# Patient Record
Sex: Male | Born: 1945 | Race: White | Hispanic: No | Marital: Married | Smoking: Never smoker
Health system: Southern US, Community
[De-identification: ages and names within clinical notes are randomized; demographics above are authoritative.]

## PROBLEM LIST (undated history)

## (undated) DIAGNOSIS — R0600 Dyspnea, unspecified: Secondary | ICD-10-CM

## (undated) DIAGNOSIS — R918 Other nonspecific abnormal finding of lung field: Secondary | ICD-10-CM

## (undated) DIAGNOSIS — I4821 Permanent atrial fibrillation: Secondary | ICD-10-CM

## (undated) DIAGNOSIS — R011 Cardiac murmur, unspecified: Secondary | ICD-10-CM

## (undated) DIAGNOSIS — D649 Anemia, unspecified: Secondary | ICD-10-CM

## (undated) DIAGNOSIS — I10 Essential (primary) hypertension: Secondary | ICD-10-CM

## (undated) DIAGNOSIS — I272 Pulmonary hypertension, unspecified: Secondary | ICD-10-CM

## (undated) DIAGNOSIS — C801 Malignant (primary) neoplasm, unspecified: Secondary | ICD-10-CM

## (undated) DIAGNOSIS — I219 Acute myocardial infarction, unspecified: Secondary | ICD-10-CM

## (undated) DIAGNOSIS — Z836 Family history of other diseases of the respiratory system: Secondary | ICD-10-CM

## (undated) DIAGNOSIS — I2510 Atherosclerotic heart disease of native coronary artery without angina pectoris: Secondary | ICD-10-CM

## (undated) DIAGNOSIS — I341 Nonrheumatic mitral (valve) prolapse: Secondary | ICD-10-CM

## (undated) DIAGNOSIS — I639 Cerebral infarction, unspecified: Secondary | ICD-10-CM

## (undated) DIAGNOSIS — E785 Hyperlipidemia, unspecified: Secondary | ICD-10-CM

## (undated) DIAGNOSIS — I77810 Thoracic aortic ectasia: Secondary | ICD-10-CM

## (undated) DIAGNOSIS — F32A Depression, unspecified: Secondary | ICD-10-CM

## (undated) DIAGNOSIS — Z86010 Personal history of colonic polyps: Secondary | ICD-10-CM

## (undated) DIAGNOSIS — K219 Gastro-esophageal reflux disease without esophagitis: Secondary | ICD-10-CM

## (undated) DIAGNOSIS — M1990 Unspecified osteoarthritis, unspecified site: Secondary | ICD-10-CM

## (undated) DIAGNOSIS — I499 Cardiac arrhythmia, unspecified: Secondary | ICD-10-CM

## (undated) DIAGNOSIS — J159 Unspecified bacterial pneumonia: Secondary | ICD-10-CM

## (undated) DIAGNOSIS — F329 Major depressive disorder, single episode, unspecified: Secondary | ICD-10-CM

## (undated) DIAGNOSIS — J479 Bronchiectasis, uncomplicated: Secondary | ICD-10-CM

## (undated) DIAGNOSIS — I071 Rheumatic tricuspid insufficiency: Secondary | ICD-10-CM

## (undated) DIAGNOSIS — R7402 Elevation of levels of lactic acid dehydrogenase (LDH): Secondary | ICD-10-CM

## (undated) DIAGNOSIS — R740 Nonspecific elevation of levels of transaminase and lactic acid dehydrogenase [LDH]: Secondary | ICD-10-CM

## (undated) DIAGNOSIS — I77819 Aortic ectasia, unspecified site: Secondary | ICD-10-CM

## (undated) DIAGNOSIS — H269 Unspecified cataract: Secondary | ICD-10-CM

## (undated) DIAGNOSIS — R7401 Elevation of levels of liver transaminase levels: Secondary | ICD-10-CM

## (undated) DIAGNOSIS — R590 Localized enlarged lymph nodes: Secondary | ICD-10-CM

## (undated) DIAGNOSIS — K5790 Diverticulosis of intestine, part unspecified, without perforation or abscess without bleeding: Secondary | ICD-10-CM

## (undated) HISTORY — DX: Permanent atrial fibrillation: I48.21

## (undated) HISTORY — PX: CARDIOVERSION: SHX1299

## (undated) HISTORY — DX: Nonspecific elevation of levels of transaminase and lactic acid dehydrogenase (ldh): R74.0

## (undated) HISTORY — DX: Thoracic aortic ectasia: I77.810

## (undated) HISTORY — DX: Cardiac murmur, unspecified: R01.1

## (undated) HISTORY — DX: Unspecified osteoarthritis, unspecified site: M19.90

## (undated) HISTORY — DX: Diverticulosis of intestine, part unspecified, without perforation or abscess without bleeding: K57.90

## (undated) HISTORY — PX: NASAL RECONSTRUCTION: SHX2069

## (undated) HISTORY — DX: Pulmonary hypertension, unspecified: I27.20

## (undated) HISTORY — DX: Nonrheumatic mitral (valve) prolapse: I34.1

## (undated) HISTORY — PX: TONSILLECTOMY: SUR1361

## (undated) HISTORY — DX: Rheumatic tricuspid insufficiency: I07.1

## (undated) HISTORY — DX: Unspecified cataract: H26.9

## (undated) HISTORY — DX: Gastro-esophageal reflux disease without esophagitis: K21.9

## (undated) HISTORY — DX: Hyperlipidemia, unspecified: E78.5

## (undated) HISTORY — DX: Family history of other diseases of the respiratory system: Z83.6

## (undated) HISTORY — DX: Elevation of levels of lactic acid dehydrogenase (LDH): R74.02

## (undated) HISTORY — DX: Unspecified bacterial pneumonia: J15.9

## (undated) HISTORY — DX: Personal history of colonic polyps: Z86.010

## (undated) HISTORY — DX: Aortic ectasia, unspecified site: I77.819

## (undated) HISTORY — DX: Major depressive disorder, single episode, unspecified: F32.9

## (undated) HISTORY — DX: Anemia, unspecified: D64.9

## (undated) HISTORY — DX: Depression, unspecified: F32.A

## (undated) HISTORY — DX: Essential (primary) hypertension: I10

## (undated) HISTORY — DX: Elevation of levels of liver transaminase levels: R74.01

---

## 1998-05-25 ENCOUNTER — Ambulatory Visit (HOSPITAL_COMMUNITY): Admission: RE | Admit: 1998-05-25 | Discharge: 1998-05-25 | Payer: Self-pay | Admitting: Internal Medicine

## 1999-03-07 ENCOUNTER — Emergency Department (HOSPITAL_COMMUNITY): Admission: EM | Admit: 1999-03-07 | Discharge: 1999-03-07 | Payer: Self-pay | Admitting: Emergency Medicine

## 1999-03-07 ENCOUNTER — Encounter: Payer: Self-pay | Admitting: Emergency Medicine

## 1999-08-25 ENCOUNTER — Encounter: Payer: Self-pay | Admitting: Internal Medicine

## 1999-08-25 ENCOUNTER — Ambulatory Visit (HOSPITAL_COMMUNITY): Admission: RE | Admit: 1999-08-25 | Discharge: 1999-08-25 | Payer: Self-pay | Admitting: Internal Medicine

## 2001-11-13 HISTORY — PX: HERNIA REPAIR: SHX51

## 2001-11-22 ENCOUNTER — Ambulatory Visit (HOSPITAL_BASED_OUTPATIENT_CLINIC_OR_DEPARTMENT_OTHER): Admission: RE | Admit: 2001-11-22 | Discharge: 2001-11-22 | Payer: Self-pay | Admitting: Surgery

## 2001-11-26 ENCOUNTER — Ambulatory Visit (HOSPITAL_BASED_OUTPATIENT_CLINIC_OR_DEPARTMENT_OTHER): Admission: RE | Admit: 2001-11-26 | Discharge: 2001-11-26 | Payer: Self-pay | Admitting: Surgery

## 2003-06-23 ENCOUNTER — Encounter: Payer: Self-pay | Admitting: Internal Medicine

## 2004-03-23 ENCOUNTER — Ambulatory Visit: Payer: Self-pay | Admitting: Cardiology

## 2004-04-19 ENCOUNTER — Ambulatory Visit: Payer: Self-pay | Admitting: Cardiology

## 2004-05-16 HISTORY — PX: COLONOSCOPY: SHX174

## 2004-05-31 ENCOUNTER — Ambulatory Visit: Payer: Self-pay

## 2004-06-28 ENCOUNTER — Ambulatory Visit: Payer: Self-pay | Admitting: Internal Medicine

## 2004-06-28 ENCOUNTER — Ambulatory Visit: Payer: Self-pay | Admitting: Cardiology

## 2004-07-06 ENCOUNTER — Ambulatory Visit: Payer: Self-pay | Admitting: Internal Medicine

## 2004-07-26 ENCOUNTER — Ambulatory Visit: Payer: Self-pay | Admitting: Cardiology

## 2004-08-23 ENCOUNTER — Ambulatory Visit: Payer: Self-pay | Admitting: Cardiology

## 2004-09-20 ENCOUNTER — Ambulatory Visit: Payer: Self-pay | Admitting: *Deleted

## 2004-11-01 ENCOUNTER — Ambulatory Visit: Payer: Self-pay | Admitting: Cardiology

## 2004-11-22 ENCOUNTER — Ambulatory Visit: Payer: Self-pay | Admitting: Internal Medicine

## 2004-12-20 ENCOUNTER — Ambulatory Visit: Payer: Self-pay | Admitting: Cardiology

## 2005-01-24 ENCOUNTER — Ambulatory Visit: Payer: Self-pay | Admitting: Cardiology

## 2005-02-14 ENCOUNTER — Ambulatory Visit: Payer: Self-pay | Admitting: Cardiology

## 2005-03-01 ENCOUNTER — Ambulatory Visit: Payer: Self-pay | Admitting: Internal Medicine

## 2005-03-22 ENCOUNTER — Ambulatory Visit: Payer: Self-pay | Admitting: Internal Medicine

## 2005-03-30 ENCOUNTER — Ambulatory Visit: Payer: Self-pay | Admitting: Cardiology

## 2005-04-15 ENCOUNTER — Ambulatory Visit: Payer: Self-pay | Admitting: Internal Medicine

## 2005-05-23 ENCOUNTER — Ambulatory Visit: Payer: Self-pay | Admitting: Cardiology

## 2005-06-20 ENCOUNTER — Ambulatory Visit: Payer: Self-pay | Admitting: Cardiology

## 2005-07-15 ENCOUNTER — Ambulatory Visit: Payer: Self-pay | Admitting: Cardiology

## 2005-08-16 ENCOUNTER — Ambulatory Visit: Payer: Self-pay | Admitting: Cardiology

## 2005-08-24 ENCOUNTER — Ambulatory Visit: Payer: Self-pay | Admitting: Internal Medicine

## 2005-09-19 ENCOUNTER — Ambulatory Visit: Payer: Self-pay | Admitting: Internal Medicine

## 2005-09-19 ENCOUNTER — Ambulatory Visit: Payer: Self-pay | Admitting: Cardiology

## 2005-10-12 ENCOUNTER — Ambulatory Visit: Payer: Self-pay | Admitting: Internal Medicine

## 2005-11-01 ENCOUNTER — Ambulatory Visit: Payer: Self-pay | Admitting: Cardiology

## 2005-12-05 ENCOUNTER — Ambulatory Visit: Payer: Self-pay | Admitting: Cardiology

## 2006-01-02 ENCOUNTER — Ambulatory Visit: Payer: Self-pay | Admitting: Cardiovascular Disease

## 2006-01-30 ENCOUNTER — Ambulatory Visit: Payer: Self-pay | Admitting: Cardiovascular Disease

## 2006-03-02 ENCOUNTER — Ambulatory Visit: Payer: Self-pay | Admitting: Internal Medicine

## 2006-04-07 ENCOUNTER — Ambulatory Visit: Payer: Self-pay | Admitting: Cardiology

## 2006-05-10 ENCOUNTER — Ambulatory Visit: Payer: Self-pay | Admitting: Cardiology

## 2006-06-12 ENCOUNTER — Ambulatory Visit: Payer: Self-pay | Admitting: Cardiology

## 2006-07-10 ENCOUNTER — Ambulatory Visit: Payer: Self-pay | Admitting: Cardiovascular Disease

## 2006-08-07 ENCOUNTER — Ambulatory Visit: Payer: Self-pay | Admitting: Internal Medicine

## 2006-09-06 ENCOUNTER — Ambulatory Visit: Payer: Self-pay | Admitting: Internal Medicine

## 2006-10-11 DIAGNOSIS — R740 Nonspecific elevation of levels of transaminase and lactic acid dehydrogenase [LDH]: Secondary | ICD-10-CM

## 2006-10-13 ENCOUNTER — Ambulatory Visit: Payer: Self-pay | Admitting: Cardiology

## 2006-11-13 ENCOUNTER — Ambulatory Visit: Payer: Self-pay | Admitting: Cardiology

## 2006-12-01 ENCOUNTER — Ambulatory Visit: Payer: Self-pay | Admitting: Cardiovascular Disease

## 2006-12-11 ENCOUNTER — Ambulatory Visit: Payer: Self-pay | Admitting: Internal Medicine

## 2006-12-13 ENCOUNTER — Telehealth (INDEPENDENT_AMBULATORY_CARE_PROVIDER_SITE_OTHER): Payer: Self-pay | Admitting: *Deleted

## 2006-12-25 ENCOUNTER — Ambulatory Visit: Payer: Self-pay | Admitting: Internal Medicine

## 2007-01-08 ENCOUNTER — Ambulatory Visit: Payer: Self-pay | Admitting: Cardiology

## 2007-01-29 ENCOUNTER — Ambulatory Visit: Payer: Self-pay | Admitting: Internal Medicine

## 2007-02-07 ENCOUNTER — Telehealth (INDEPENDENT_AMBULATORY_CARE_PROVIDER_SITE_OTHER): Payer: Self-pay | Admitting: *Deleted

## 2007-02-26 ENCOUNTER — Ambulatory Visit: Payer: Self-pay | Admitting: Internal Medicine

## 2007-03-01 DIAGNOSIS — I080 Rheumatic disorders of both mitral and aortic valves: Secondary | ICD-10-CM

## 2007-03-01 DIAGNOSIS — I079 Rheumatic tricuspid valve disease, unspecified: Secondary | ICD-10-CM

## 2007-03-08 ENCOUNTER — Ambulatory Visit: Payer: Self-pay | Admitting: Internal Medicine

## 2007-03-08 DIAGNOSIS — E782 Mixed hyperlipidemia: Secondary | ICD-10-CM | POA: Insufficient documentation

## 2007-03-13 ENCOUNTER — Ambulatory Visit: Payer: Self-pay

## 2007-03-13 ENCOUNTER — Encounter: Payer: Self-pay | Admitting: Internal Medicine

## 2007-03-14 ENCOUNTER — Encounter (INDEPENDENT_AMBULATORY_CARE_PROVIDER_SITE_OTHER): Payer: Self-pay | Admitting: *Deleted

## 2007-03-26 ENCOUNTER — Ambulatory Visit: Payer: Self-pay | Admitting: Cardiology

## 2007-04-03 ENCOUNTER — Encounter (INDEPENDENT_AMBULATORY_CARE_PROVIDER_SITE_OTHER): Payer: Self-pay | Admitting: *Deleted

## 2007-04-03 ENCOUNTER — Ambulatory Visit: Payer: Self-pay | Admitting: Internal Medicine

## 2007-04-16 ENCOUNTER — Ambulatory Visit: Payer: Self-pay | Admitting: Internal Medicine

## 2007-04-23 ENCOUNTER — Ambulatory Visit: Payer: Self-pay | Admitting: Internal Medicine

## 2007-06-04 ENCOUNTER — Ambulatory Visit: Payer: Self-pay | Admitting: Cardiovascular Disease

## 2007-07-02 ENCOUNTER — Ambulatory Visit: Payer: Self-pay | Admitting: Internal Medicine

## 2007-07-30 ENCOUNTER — Ambulatory Visit: Payer: Self-pay | Admitting: Internal Medicine

## 2007-08-20 ENCOUNTER — Ambulatory Visit: Payer: Self-pay | Admitting: Internal Medicine

## 2007-09-17 ENCOUNTER — Ambulatory Visit: Payer: Self-pay | Admitting: Cardiovascular Disease

## 2007-10-15 ENCOUNTER — Ambulatory Visit: Payer: Self-pay | Admitting: Cardiology

## 2007-10-24 ENCOUNTER — Ambulatory Visit: Payer: Self-pay | Admitting: Internal Medicine

## 2007-10-26 ENCOUNTER — Encounter (INDEPENDENT_AMBULATORY_CARE_PROVIDER_SITE_OTHER): Payer: Self-pay | Admitting: *Deleted

## 2007-10-26 LAB — CONVERTED CEMR LAB
Rheumatoid fact SerPl-aCnc: <20 intl units/mL — ABNORMAL LOW (ref 0.0–20.0)
Sed Rate: 15 mm/hr (ref 0–16)
Uric Acid, Serum: 4.9 mg/dL (ref 4.0–7.8)

## 2007-11-12 ENCOUNTER — Ambulatory Visit: Payer: Self-pay | Admitting: Cardiology

## 2007-12-10 ENCOUNTER — Ambulatory Visit: Payer: Self-pay | Admitting: Cardiology

## 2007-12-20 ENCOUNTER — Ambulatory Visit: Payer: Self-pay | Admitting: Cardiology

## 2008-01-28 ENCOUNTER — Ambulatory Visit: Payer: Self-pay | Admitting: Cardiology

## 2008-01-28 ENCOUNTER — Encounter: Payer: Self-pay | Admitting: Internal Medicine

## 2008-02-25 ENCOUNTER — Ambulatory Visit: Payer: Self-pay | Admitting: Cardiology

## 2008-03-24 ENCOUNTER — Ambulatory Visit: Payer: Self-pay | Admitting: Cardiovascular Disease

## 2008-03-25 ENCOUNTER — Encounter (INDEPENDENT_AMBULATORY_CARE_PROVIDER_SITE_OTHER): Payer: Self-pay | Admitting: *Deleted

## 2008-04-11 ENCOUNTER — Ambulatory Visit: Payer: Self-pay | Admitting: Cardiology

## 2008-05-08 ENCOUNTER — Ambulatory Visit: Payer: Self-pay | Admitting: Cardiovascular Disease

## 2008-06-09 ENCOUNTER — Ambulatory Visit: Payer: Self-pay | Admitting: Internal Medicine

## 2008-06-23 ENCOUNTER — Ambulatory Visit: Payer: Self-pay | Admitting: Internal Medicine

## 2008-07-07 ENCOUNTER — Ambulatory Visit: Payer: Self-pay | Admitting: Cardiovascular Disease

## 2008-08-11 ENCOUNTER — Ambulatory Visit: Payer: Self-pay | Admitting: Cardiology

## 2008-09-08 ENCOUNTER — Ambulatory Visit: Payer: Self-pay | Admitting: Cardiology

## 2008-10-14 ENCOUNTER — Encounter: Payer: Self-pay | Admitting: *Deleted

## 2008-10-24 ENCOUNTER — Ambulatory Visit: Payer: Self-pay | Admitting: Cardiovascular Disease

## 2008-10-24 LAB — CONVERTED CEMR LAB
POC INR: 2
Protime: 17.5

## 2008-11-19 ENCOUNTER — Encounter: Payer: Self-pay | Admitting: *Deleted

## 2008-11-24 ENCOUNTER — Ambulatory Visit: Payer: Self-pay | Admitting: Cardiovascular Disease

## 2008-11-24 ENCOUNTER — Encounter (INDEPENDENT_AMBULATORY_CARE_PROVIDER_SITE_OTHER): Payer: Self-pay | Admitting: Cardiology

## 2008-11-24 LAB — CONVERTED CEMR LAB
POC INR: 2.1
Prothrombin Time: 17.9 s

## 2008-12-01 ENCOUNTER — Ambulatory Visit: Payer: Self-pay | Admitting: Internal Medicine

## 2008-12-01 DIAGNOSIS — M79609 Pain in unspecified limb: Secondary | ICD-10-CM | POA: Insufficient documentation

## 2008-12-01 DIAGNOSIS — F329 Major depressive disorder, single episode, unspecified: Secondary | ICD-10-CM

## 2008-12-01 LAB — CONVERTED CEMR LAB
Cholesterol, target level: 200 mg/dL
HDL goal, serum: 40 mg/dL
LDL Goal: 115 mg/dL

## 2008-12-02 ENCOUNTER — Ambulatory Visit: Payer: Self-pay | Admitting: Internal Medicine

## 2008-12-05 ENCOUNTER — Telehealth (INDEPENDENT_AMBULATORY_CARE_PROVIDER_SITE_OTHER): Payer: Self-pay | Admitting: *Deleted

## 2008-12-08 ENCOUNTER — Encounter (INDEPENDENT_AMBULATORY_CARE_PROVIDER_SITE_OTHER): Payer: Self-pay | Admitting: *Deleted

## 2008-12-29 ENCOUNTER — Ambulatory Visit: Payer: Self-pay | Admitting: Cardiology

## 2008-12-29 LAB — CONVERTED CEMR LAB: POC INR: 1.6

## 2009-01-26 ENCOUNTER — Ambulatory Visit: Payer: Self-pay | Admitting: Internal Medicine

## 2009-01-26 LAB — CONVERTED CEMR LAB: POC INR: 2.1

## 2009-02-05 ENCOUNTER — Encounter: Payer: Self-pay | Admitting: Internal Medicine

## 2009-02-13 ENCOUNTER — Encounter: Payer: Self-pay | Admitting: Internal Medicine

## 2009-02-15 LAB — CONVERTED CEMR LAB
ALT: 26 units/L (ref 0–53)
AST: 34 units/L (ref 0–37)
Albumin: 3.6 g/dL (ref 3.5–5.2)
Alkaline Phosphatase: 52 units/L (ref 39–117)
Bilirubin, Direct: 0.1 mg/dL (ref 0.0–0.3)
Cholesterol: 166 mg/dL (ref 0–200)
HDL: 61.6 mg/dL (ref >39.00)
LDL Cholesterol: 96 mg/dL (ref 0–99)
PSA: 1.31 ng/mL (ref 0.10–4.00)
Rheumatoid fact SerPl-aCnc: <20 intl units/mL (ref 0.0–20.0)
Sed Rate: 5 mm/hr (ref 0–22)
TSH: 2.52 microintl units/mL (ref 0.35–5.50)
Total Bilirubin: 0.9 mg/dL (ref 0.3–1.2)
Total CHOL/HDL Ratio: 3
Total Protein: 6.6 g/dL (ref 6.0–8.3)
Triglycerides: 43 mg/dL (ref 0.0–149.0)
Uric Acid, Serum: 6.3 mg/dL (ref 4.0–7.8)
VLDL: 8.6 mg/dL (ref 0.0–40.0)

## 2009-02-23 ENCOUNTER — Ambulatory Visit: Payer: Self-pay | Admitting: Internal Medicine

## 2009-03-03 ENCOUNTER — Ambulatory Visit: Payer: Self-pay | Admitting: Family Medicine

## 2009-03-23 ENCOUNTER — Ambulatory Visit: Payer: Self-pay | Admitting: Cardiology

## 2009-04-03 ENCOUNTER — Ambulatory Visit: Payer: Self-pay | Admitting: Internal Medicine

## 2009-04-27 ENCOUNTER — Ambulatory Visit: Payer: Self-pay | Admitting: Cardiovascular Disease

## 2009-06-08 ENCOUNTER — Ambulatory Visit: Payer: Self-pay | Admitting: Internal Medicine

## 2009-06-08 LAB — CONVERTED CEMR LAB: POC INR: 2.2

## 2009-07-15 ENCOUNTER — Ambulatory Visit: Payer: Self-pay | Admitting: Cardiovascular Disease

## 2009-07-15 LAB — CONVERTED CEMR LAB: POC INR: 2.1

## 2009-08-28 ENCOUNTER — Ambulatory Visit: Payer: Self-pay | Admitting: Internal Medicine

## 2009-08-31 ENCOUNTER — Encounter: Payer: Self-pay | Admitting: Internal Medicine

## 2009-09-09 ENCOUNTER — Ambulatory Visit: Payer: Self-pay | Admitting: Cardiovascular Disease

## 2009-09-15 ENCOUNTER — Ambulatory Visit: Payer: Self-pay | Admitting: Internal Medicine

## 2009-09-15 LAB — CONVERTED CEMR LAB: LDL Goal: 160 mg/dL

## 2009-11-12 ENCOUNTER — Ambulatory Visit: Payer: Self-pay | Admitting: Cardiology

## 2009-11-12 LAB — CONVERTED CEMR LAB: POC INR: 2.8

## 2009-11-23 ENCOUNTER — Ambulatory Visit: Payer: Self-pay | Admitting: Internal Medicine

## 2009-11-23 ENCOUNTER — Encounter: Payer: Self-pay | Admitting: Internal Medicine

## 2010-01-18 ENCOUNTER — Encounter: Payer: Self-pay | Admitting: Internal Medicine

## 2010-06-13 LAB — CONVERTED CEMR LAB
ALT: 26 units/L (ref 0–53)
AST: 30 units/L (ref 0–37)
Albumin: 4 g/dL (ref 3.5–5.2)
Alkaline Phosphatase: 50 units/L (ref 39–117)
BUN: 8 mg/dL (ref 6–23)
Basophils Absolute: 0 10*3/uL (ref 0.0–0.1)
Basophils Relative: 0.5 % (ref 0.0–1.0)
Bilirubin, Direct: 0.1 mg/dL (ref 0.0–0.3)
CO2: 31 meq/L (ref 19–32)
Calcium: 9.5 mg/dL (ref 8.4–10.5)
Chloride: 105 meq/L (ref 96–112)
Cholesterol: 187 mg/dL (ref 0–200)
Creatinine, Ser: 1 mg/dL (ref 0.4–1.5)
Eosinophils Absolute: 0.1 10*3/uL (ref 0.0–0.6)
Eosinophils Relative: 2.2 % (ref 0.0–5.0)
GFR calc Af Amer: 97 mL/min
GFR calc non Af Amer: 80 mL/min
Glucose, Bld: 99 mg/dL (ref 70–99)
HCT: 40.6 % (ref 39.0–52.0)
HDL: 58 mg/dL (ref >39.0)
Hemoglobin: 14.2 g/dL (ref 13.0–17.0)
LDL Cholesterol: 115 mg/dL — ABNORMAL HIGH (ref 0–99)
Lymphocytes Relative: 24.9 % (ref 12.0–46.0)
MCHC: 35 g/dL (ref 30.0–36.0)
MCV: 100.1 fL — ABNORMAL HIGH (ref 78.0–100.0)
Monocytes Absolute: 0.6 10*3/uL (ref 0.2–0.7)
Monocytes Relative: 9.4 % (ref 3.0–11.0)
Neutro Abs: 3.7 10*3/uL (ref 1.4–7.7)
Neutrophils Relative %: 63 % (ref 43.0–77.0)
PSA: 1.66 ng/mL (ref 0.10–4.00)
Platelets: 212 10*3/uL (ref 150–400)
Potassium: 4.9 meq/L (ref 3.5–5.1)
RBC: 4.05 M/uL — ABNORMAL LOW (ref 4.22–5.81)
RDW: 13.2 % (ref 11.5–14.6)
Sodium: 140 meq/L (ref 135–145)
TSH: 2.7 microintl units/mL (ref 0.35–5.50)
Total Bilirubin: 0.8 mg/dL (ref 0.3–1.2)
Total CHOL/HDL Ratio: 3.2
Total Protein: 7.1 g/dL (ref 6.0–8.3)
Triglycerides: 72 mg/dL (ref 0–149)
VLDL: 14 mg/dL (ref 0–40)
WBC: 5.9 10*3/uL (ref 4.5–10.5)

## 2010-06-17 NOTE — Medication Information (Signed)
Summary: coumadin check/mt  Anticoagulant Therapy  Managed by: Cloyde Reams, RN, BSN Referring MD: Sharrell Ku PCP: hopp Supervising MD: Shirlee Latch MD, Ronnika Collett Indication 1: Atrial Fibrillation (ICD-427.31) Lab Used: [facility] Site: [facility] INR POC 2.8 INR RANGE 2-3  Dietary changes: no    Health status changes: no    Bleeding/hemorrhagic complications: no    Recent/future hospitalizations: no    Any changes in medication regimen? no    Recent/future dental: no  Any missed doses?: no       Is patient compliant with meds? yes       Allergies: 1)  ! * Amiodarone  Anticoagulation Management History:      The patient is taking warfarin and comes in today for a routine follow up visit.  Positive risk factors for bleeding include an age of 66 years or older.  Negative risk factors for bleeding include no history of CVA/TIA.  The bleeding index is 'intermediate risk'.  Negative CHADS2 values include History of HTN, Age > 67 years old, History of Diabetes, and Prior Stroke/CVA/TIA.  The start date was 04/01/1998.  Anticoagulation responsible provider: Shirlee Latch MD, Wess Baney.  INR POC: 2.8.  Cuvette Lot#: [id number].  Exp: 01/2011.    Anticoagulation Management Assessment/Plan:      The patient's current anticoagulation dose is Coumadin 5 mg tabs: Take as directed by coumadin clinic..  The target INR is 2.0-3.0.  The next INR is due 12/14/2009.  Anticoagulation instructions were given to patient.  Results were reviewed/authorized by Cloyde Reams, RN, BSN.  He was notified by Cloyde Reams RN.         Prior Anticoagulation Instructions: INR 2.0  Continue on same dosage 1/2 tablet daily except 1 tablet on Mondays and Thursdays.  Recheck in 4 weeks.    Current Anticoagulation Instructions: INR 2.8  Continue on same dosage 1/2 tablet daily except 1 tablets on Mondays and Thursdays.  Recheck in 4 weeks.

## 2010-06-17 NOTE — Medication Information (Signed)
Summary: rov/ewj  Anticoagulant Therapy  Managed by: Cloyde Reams, RN, BSN Referring MD: Sharrell Ku PCP: hopp Supervising MD: Eden Emms MD, Theron Arista Indication 1: Atrial Fibrillation (ICD-427.31) Lab Used: [facility] Site: [facility] INR POC 2.0 INR RANGE 2-3  Dietary changes: no    Health status changes: no    Bleeding/hemorrhagic complications: no    Recent/future hospitalizations: no    Any changes in medication regimen? no    Recent/future dental: no  Any missed doses?: yes     Details: Missed Sat dose but made up on Sun.  Is patient compliant with meds? yes       Allergies: 1)  ! * Amiodarone  Anticoagulation Management History:      The patient is taking warfarin and comes in today for a routine follow up visit.  Negative risk factors for bleeding include an age less than 47 years old and no history of CVA/TIA.  The bleeding index is 'low risk'.  Negative CHADS2 values include History of HTN, Age > 59 years old, History of Diabetes, and Prior Stroke/CVA/TIA.  The start date was 04/01/1998.  Anticoagulation responsible provider: Eden Emms MD, Theron Arista.  INR POC: 2.0.  Cuvette Lot#: [id number].  Exp: 10/2010.    Anticoagulation Management Assessment/Plan:      The patient's current anticoagulation dose is Coumadin 5 mg tabs: Take as directed by coumadin clinic..  The target INR is 2.0-3.0.  The next INR is due 10/07/2009.  Anticoagulation instructions were given to patient.  Results were reviewed/authorized by Cloyde Reams, RN, BSN.  He was notified by Cloyde Reams RN.         Prior Anticoagulation Instructions: INR 2.1  Continue on same dosage 1/2 tablet daily except 1 tablet on Mondays and Thursdays.  Recheck in 4 weeks.    Current Anticoagulation Instructions: INR 2.0  Continue on same dosage 1/2 tablet daily except 1 tablet on Mondays and Thursdays.  Recheck in 4 weeks.
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
## 2010-06-17 NOTE — Letter (Signed)
Summary: Health Assessment/[employer]  Health Assessment/[employer]   Imported By: Lanelle Bal 02/08/2010 12:58:05  _____________________________________________________________________  External Attachment:    Type:   Image     Comment:   External Document

## 2010-06-17 NOTE — Medication Information (Signed)
Summary: rov/ewj  Anticoagulant Therapy  Managed by: Shelby Dubin, PharmD Referring MD: Sharrell Ku PCP: hopp Supervising MD: Tenny Craw MD, Gunnar Fusi Indication 1: Atrial Fibrillation (ICD-427.31) Lab Used: [facility] Site: [facility] INR POC 2.2 INR RANGE 2-3  Dietary changes: no    Health status changes: no    Bleeding/hemorrhagic complications: no    Recent/future hospitalizations: no    Any changes in medication regimen? no    Recent/future dental: no  Any missed doses?: no       Is patient compliant with meds? yes       Current Medications (verified): 1)  Coumadin 5 Mg Tabs (Warfarin Sodium) .... Take As Directed By Coumadin Clinic. 2)  Multivitamin 3)  Fish Oil 4)  Folic Acid 5)  Vit B-Complex 6)  Vit C1000mg  7)  Viagra 100 Mg Tabs (Sildenafil Citrate) .... Prn 8)  Ranitidine Hcl 150 Mg Tabs (Ranitidine Hcl) .[name].. 1 Two Times A Day Pre Meals  Allergies (verified): 1)  ! * Amiodarone  Anticoagulation Management History:      The patient is taking warfarin and comes in today for a routine follow up visit.  Negative risk factors for bleeding include an age less than 29 years old and no history of CVA/TIA.  The bleeding index is 'low risk'.  Negative CHADS2 values include History of HTN, Age > 53 years old, History of Diabetes, and Prior Stroke/CVA/TIA.  The start date was 04/01/1998.  Anticoagulation responsible provider: Tenny Craw MD, Gunnar Fusi.  INR POC: 2.2.  Cuvette Lot#: [id number].  Exp: 08/2010.    Anticoagulation Management Assessment/Plan:      The patient's current anticoagulation dose is Coumadin 5 mg tabs: Take as directed by coumadin clinic..  The target INR is 0 - 0.  The next INR is due 07/06/2009.  Anticoagulation instructions were given to patient.  Results were reviewed/authorized by Shelby Dubin, PharmD.  He was notified by Lew Dawes, PharmD Candidate.         Prior Anticoagulation Instructions: INR 2.0  Take 5mg  tomorrow then resume same dosage 2.5mg   daily except 5mg  on Mondays and Thursdays.  Recheck in 4 weeks.    Current Anticoagulation Instructions: INR 2.2  Continue same dose of 0.5 tablet daily except 1 tablet on Mondays and Thursdays. Recheck in 4 weeks.

## 2010-06-17 NOTE — Assessment & Plan Note (Signed)
Summary: fu on bloodwork/kdc   Vital Signs:  Patient profile:   65 year old male Weight:      169.8 pounds BMI:     21.59 Pulse rate:   80 / minute Resp:     14 per minute BP sitting:   98 / 66  (left arm) Cuff size:   large  Vitals Entered By: Shonna Chock (Sep 15, 2009 10:21 AM) CC: Follow-up visit: Discuss Labs (copy given) , Lipid Management Comments REVIEWED MED LIST, PATIENT AGREED DOSE AND INSTRUCTION CORRECT    Primary Care Provider:  hopp  CC:  Follow-up visit: Discuss Labs (copy given)  and Lipid Management.  History of Present Illness: NMR reviewed ;this was done after > 6 months OFF Zetia. LDL of 119 is ideal. CVE as gym workout  for 1-2 hrs 3-4X/week.No specific diet , but decreased red meat. Body fat has decreased from 30% to 18% & waist from 36 to 34 inches with TLC over 18 months.  Lipid Management History:      Positive NCEP/ATP III risk factors include male age 63 years old or older and family history for ischemic heart disease (males less than 71 years old).  Negative NCEP/ATP III risk factors include non-diabetic, HDL cholesterol greater than 60, non-tobacco-user status, non-hypertensive, no ASHD (atherosclerotic heart disease), no prior stroke/TIA, no peripheral vascular disease, and no history of aortic aneurysm.     Allergies: 1)  ! * Amiodarone  Past History:  Past Medical History: Atrial fibrillation, on coumadin Elevated LFTs, PMH of Diverticulosis, colon Hyperlipidemia: NMR Lipoprofile 08/2009: LDL 119(999/95), TG 65, HDL 62. LDL goal = < 160, ideally < 120.  Family History: Father: COAD Mother: oral  cancer Siblings: bro smoker,deconditioned,MI @ 32; sisters arthritis (3)  Review of Systems CV:  Denies chest pain or discomfort, difficulty breathing at night, difficulty breathing while lying down, leg cramps with exertion, palpitations, shortness of breath with exertion, and swelling of feet.  Physical Exam  General:  Thin but   well-nourished; alert,appropriate and cooperative throughout examination Heart:  normal rate,minimally irregular rhythm, no gallop, no rub, and no JVD. Low diastolic  murmur @ apex  Pulses:  R and L carotid,radia,dorsalis pedis and posterior tibial pulses are full and equal bilaterally Extremities:  No clubbing, cyanosis, edema.   Impression & Recommendations:  Problem # 1:  HYPERLIPIDEMIA (ICD-272.2) LDL is ideal ; no lipid risk despite + FH in brother  Problem # 2:  FIBRILLATION, ATRIAL (ICD-427.31) as per Dr. Ladona Ridgel His updated medication list for this problem includes:    Coumadin 5 Mg Tabs (Warfarin sodium) .[name]... Take as directed by coumadin clinic.  Complete Medication List: 1)  Coumadin 5 Mg Tabs (Warfarin sodium) .... Take as directed by coumadin clinic. 2)  Multivitamin  3)  Fish Oil  4)  Folic Acid  5)  Vit B-complex  6)  Vit C1000mg   7)  Viagra 100 Mg Tabs (Sildenafil citrate) .... Prn 8)  Ranitidine Hcl 150 Mg Tabs (Ranitidine hcl) .[name].. 1 two times a day pre meals  Lipid Assessment/Plan:      Based on NCEP/ATP III, the patient's risk factor category is "0-1 risk factors".  The patient's lipid goals are as follows: Total cholesterol goal is 200; LDL cholesterol goal is 160; HDL cholesterol goal is 40; Triglyceride goal is 150.  His LDL cholesterol goal has been met.    Patient Instructions: 1)  Monitor Lipids every 12 months off Zetia.

## 2010-06-17 NOTE — Medication Information (Signed)
Summary: Coumadin Clinic  Anticoagulant Therapy  Managed by: Inactive Referring MD: Sharrell Ku PCP: hopp Supervising MD: Shirlee Latch MD, Dalton Indication 1: Atrial Fibrillation (ICD-427.31) Lab Used: [facility] Site: [facility] INR RANGE 2-3          Comments: Dr. Ladona Ridgel stopped Coumadin  Allergies: 1)  ! * Amiodarone  Anticoagulation Management History:      Positive risk factors for bleeding include an age of 66 years or older.  Negative risk factors for bleeding include no history of CVA/TIA.  The bleeding index is 'intermediate risk'.  Negative CHADS2 values include History of HTN, Age > 79 years old, History of Diabetes, and Prior Stroke/CVA/TIA.  The start date was 04/01/1998.  Anticoagulation responsible provider: Shirlee Latch MD, Dalton.  Exp: 01/2011.    Anticoagulation Management Assessment/Plan:      The target INR is 2.0-3.0.  The next INR is due 12/14/2009.  Anticoagulation instructions were given to patient.  Results were reviewed/authorized by Inactive.         Prior Anticoagulation Instructions: INR 2.8  Continue on same dosage 1/2 tablet daily except 1 tablets on Mondays and Thursdays.  Recheck in 4 weeks.

## 2010-06-17 NOTE — Medication Information (Signed)
Summary: ccr/ gd  Anticoagulant Therapy  Managed by: Cloyde Reams, RN, BSN Referring MD: Sharrell Ku PCP: hopp Supervising MD: Eden Emms MD, Theron Arista Indication 1: Atrial Fibrillation (ICD-427.31) Lab Used: [facility] Site: [facility] INR POC 2.1 INR RANGE 2-3  Dietary changes: yes       Details: Incr vit K intake, incr protein in diet.  Health status changes: no    Bleeding/hemorrhagic complications: no    Recent/future hospitalizations: no    Any changes in medication regimen? no    Recent/future dental: no  Any missed doses?: yes     Details: missed a couple doses last week, but made them up per pt report.  Is patient compliant with meds? yes       Allergies (verified): 1)  ! * Amiodarone  Anticoagulation Management History:      The patient is taking warfarin and comes in today for a routine follow up visit.  Negative risk factors for bleeding include an age less than 56 years old and no history of CVA/TIA.  The bleeding index is 'low risk'.  Negative CHADS2 values include History of HTN, Age > 28 years old, History of Diabetes, and Prior Stroke/CVA/TIA.  The start date was 04/01/1998.  Anticoagulation responsible provider: Eden Emms MD, Theron Arista.  INR POC: 2.1.  Cuvette Lot#: [id number].  Exp: 09/2010.    Anticoagulation Management Assessment/Plan:      The patient's current anticoagulation dose is Coumadin 5 mg tabs: Take as directed by coumadin clinic..  The target INR is 0 - 0.  The next INR is due 08/17/2009.  Anticoagulation instructions were given to patient.  Results were reviewed/authorized by Cloyde Reams, RN, BSN.  He was notified by Cloyde Reams RN.         Prior Anticoagulation Instructions: INR 2.2  Continue same dose of 0.5 tablet daily except 1 tablet on Mondays and Thursdays. Recheck in 4 weeks.  Current Anticoagulation Instructions: INR 2.1  Continue on same dosage 1/2 tablet daily except 1 tablet on Mondays and Thursdays.  Recheck in 4 weeks.

## 2010-09-28 NOTE — Assessment & Plan Note (Signed)
[facility]                         ELECTROPHYSIOLOGY OFFICE NOTE   NAME:Troy Willis, Troy Willis                    MRN:          [id number]  DATE:06/23/2008                            DOB:          1945-08-17    Mr. Masini returns today for followup.  He is a very pleasant middle-  aged male with chronic atrial fibrillation.  He has a history of  amiodarone side effects resulting in this medicine been unable to be  taking and also resulting in severe fatigue.  Since going off of  amiodarone and going back in AFib, which he has been for several years,  he has done quite well.  He denies chest pain and denies shortness of  breath.  He is here today for followup.  He notes that his blood  pressure has been well-controlled.   MEDICINES:  1. Coumadin as directed.  2. Multivitamin.  3. Wellbutrin XL 150 a day.  4. Zetia 10 a day.  5. Fish oil supplement.  6. Vitamin B complex.   PHYSICAL EXAMINATION:  GENERAL:  He is a pleasant well-appearing middle-  aged man in no distress.  VITAL SIGNS:  The blood pressure today was 120/64, the pulse was 80 and  regular, respirations were 18, the weight was 176 pounds.  NECK:  No jugular venous distention.  LUNGS:  Clear bilaterally to auscultation.  No wheezes, rales, or  rhonchi are present.  There is no increased work of breathing.  CARDIOVASCULAR:  Irregularly regular rhythm.  Normal S1 and S2.  ABDOMEN:  Soft, nontender.  There has been no organomegaly.  EXTREMITIES:  No cyanosis, clubbing, or edema.  The pulses 2+ and  symmetric.   EKG demonstrates atrial fibrillation with incomplete right bundle-branch  block pattern.   IMPRESSION:  1. Asymptomatic chronic atrial fibrillation.  2. Borderline hypertension.  3. 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   DISCUSSION:  Overall, Mr. Mandler is stable.  I will plan to see him  back in 1-2 years for followup of  his AFib.  I will refer him for  possible inclusion in to our studies to determine whether a thrombin  inhibitor can be just as good as Coumadin.    Doylene Canning. Ladona Ridgel, MD  Electronically Signed   GWT/MedQ  DD: 06/23/2008  DT: 06/24/2008  Job #: [id number]

## 2010-09-28 NOTE — Assessment & Plan Note (Signed)
[facility]                         ELECTROPHYSIOLOGY OFFICE NOTE   NAME:Sorto, JAYVON MOUNGER                    MRN:          [id number]  DATE:04/16/2007                            DOB:          1945-07-30    HISTORY OF PRESENT ILLNESS:  Mr. Febles returns today for follow up.  He is a very pleasant 65 year old male with history of persistent  chronic atrial fibrillation.  We initially had maintained sinus rhythm  on Amiodarone, but overtime, he developed problems with this drug and  ultimately had to have it stopped.  He returns today for follow up.  I  have not seen him for almost five years.  He denies chest pain today.  His main complaint is that when he tries to exert himself too vigorously  he gets quickly short of breath and has to stop what he is doing.  He  has had no syncope.  He denies any dizziness.  No sick contacts.  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  He has had no chest discomfort.  He denies peripheral edema.   MEDICATIONS:  1. Coumadin as directed.  2. Multiple vitamins.  3. Fish oil.  4. Folate.  5. Vitamin C.  6  Zetia 10 mg a day.   PHYSICAL EXAMINATION:  GENERAL APPEARANCE:  He is a pleasant, well-  appearing middle age man in no distress.  VITAL SIGNS:  Blood pressure 123/86, pulse 79 and irregularly irregular,  respirations 18, weight 174 pounds.  NECK:  No JVD.  No thyromegaly.  Trachea is midline.  LUNGS:  Clear bilaterally to auscultation.  No wheezes, rales or  rhonchi.  No increased work of breathing.  CARDIOVASCULAR:  Irregular rate and rhythm.  Normal S1, S2.  No murmurs,  rubs or gallops present.  PMI was not enlarged, nor was it laterally  displaced.  ABDOMEN:  Soft, nontender, nondistended.  No organomegaly.  Bowel sounds  were present.  No rebound or guarding.  EXTREMITIES:  No cyanosis, clubbing or edema.  Pulses were 2+ and  symmetric.  NEUROLOGICAL:  Alert  and oriented x3 with cranial nerves intact.  Strength 5/5 and symmetric.   STUDIES:  EKG demonstrates atrial fibrillation with full ventricular  response.   IMPRESSION:  1. Chronic atrial fibrillation.  2. Borderline hypertension.  3. Chronic Coumadin therapy.   DISCUSSION:  Overall, Mr. Belcher is stable.  His atrial fibrillation  is well-controlled.  I note a recent echo done in October demonstrating  normal LV systolic function with mild mitral regurgitationn, moderate  right and left atrial dilatation.  We will plan to see Mr. Pattillo back  in the office in one year.  I have asked him to continue his present  medical therapy.  I have given him warnings as to what to expect with  regard to his atrial fibrillation, significantly warnings about  dizziness or syncope as well as shortness of breath and fatigue, and if  he has any of these things to call us and he is seen here in follow up.  He will continue on his  Coumadin in the Coumadin clinic.     Doylene Canning. Ladona Ridgel, MD  Electronically Signed    GWT/MedQ  DD: 04/16/2007  DT: 04/16/2007  Job #: [id number]

## 2010-10-01 NOTE — Op Note (Signed)
[facility]all. [facility]  Patient:    KORE, MADLOCK Visit Number: [id number] MRN: [id number]          Service Type: DSU Location: [facility] Attending Physician:  Charlton Haws Dictated by:   Currie Paris, M.D. Proc. Date: 11/26/01 Admit Date:  11/26/2001   CC:         Corwin Levins, M.D. [facility]   Operative Report  CCS# [id number]  PREOPERATIVE DIAGNOSIS:  Right inguinal hernia.  POSTOPERATIVE DIAGNOSIS:  Right inguinal hernia.  OPERATION PERFORMED:  Repair of right inguinal hernia with mesh.  SURGEON:  Currie Paris, M.D.  ANESTHESIA:  MAC.  INDICATIONS FOR PROCEDURE:  The patient is a 65 year old gentleman with a symptomatic right inguinal hernia that he elected to proceed with repair.  DESCRIPTION OF PROCEDURE:  The patient was seen in the holding area and had no further questions.  The right inguinal area was identified as the operative side and marked.  He was then taken to the operating room and given IV sedation.  The groin area was prepped and draped as a sterile field.  A combination of 1% Xylocaine plus 0.5% Marcaine with epinephrine was used for local with a total of 20 cc eventually being used.  I infiltrated the skin incision and then made it as well as the area above the anterior iliac spine. A skin incision was made and deepened to the external oblique aponeurosis which was cleaned off and then opened in line with its fibers.  The cord was dissected off the inguinal floor and surrounded with a Penrose drain.  I could readily see the ilioinguinal and iliohypogastric nerves.  A little section of the cord showed what appeared to be just a remnant of an indirect sac but really no true sac present.  This was ligated and amputated.  There was a direct defect present and I elected to repair this simply by putting a mesh patch over this.  This was done with the Prolene patch and sutured in with a running suture inferiorly and  then starting medially and tacked to the internal oblique more medially and superiorly.  The tail went around the cord structures and were tacked together taking care to avoid the nerve.  This allowed the mesh to go well lateral to the deep ring.  The mesh appeared to lie flat and smooth with enough give to make sure there was not any tension.  Everything appeared to be dry.  The external oblique was closed over the repair with 3-0 Vicryl, Scarpas with 3-0 Vicryl, and the skin with 4-0 Monocryl subcuticular plus Steri-Strips.  The patient tolerated the procedure well.  There were no operative complications.  All counts were correct. Dictated by:   Currie Paris, M.D. Attending Physician:  Charlton Haws DD:  11/26/01 TD:  11/27/01 Job: [id number]

## 2011-03-15 ENCOUNTER — Ambulatory Visit (INDEPENDENT_AMBULATORY_CARE_PROVIDER_SITE_OTHER): Payer: Medicare Other

## 2011-03-15 DIAGNOSIS — Z23 Encounter for immunization: Secondary | ICD-10-CM

## 2011-04-26 ENCOUNTER — Ambulatory Visit (INDEPENDENT_AMBULATORY_CARE_PROVIDER_SITE_OTHER): Payer: Medicare Other | Admitting: Internal Medicine

## 2011-04-26 VITALS — BP 100/62 | HR 94 | Temp 100.5°F | Ht 75.5 in | Wt 176.0 lb

## 2011-04-26 DIAGNOSIS — B9789 Other viral agents as the cause of diseases classified elsewhere: Secondary | ICD-10-CM

## 2011-04-26 DIAGNOSIS — B349 Viral infection, unspecified: Secondary | ICD-10-CM

## 2011-04-26 DIAGNOSIS — J3489 Other specified disorders of nose and nasal sinuses: Secondary | ICD-10-CM

## 2011-04-26 DIAGNOSIS — R6883 Chills (without fever): Secondary | ICD-10-CM

## 2011-04-26 DIAGNOSIS — R52 Pain, unspecified: Secondary | ICD-10-CM

## 2011-04-26 LAB — POCT INFLUENZA A/B
Influenza A, POC: NEGATIVE
Influenza B, POC: NEGATIVE

## 2011-04-26 MED ORDER — OSELTAMIVIR PHOSPHATE 75 MG PO CAPS: 75.0000 mg | ORAL_CAPSULE | Freq: Two times a day (BID) | ORAL | Status: AC

## 2011-04-26 NOTE — Progress Notes (Signed)
  Subjective:    Patient ID: Troy Willis, male    DOB: Jan 30, 1946, 65 y.o.   MRN: [id number]  HPI Symptoms started 2 days ago at 3:00. Started with dry cough, felt really bad yesterday and stayed in bed most of the day. Taking Robitussin-DM.  Past medical history--- reviewed Medications--- on no medicines at this point   Review of Systems He is having fever on and off up to 101. No nausea or vomiting, slightly loose stools. He also has a mild headache, not the worst of his life. Complains of generalized aches, particularly in the back. No sore throat In the last 24 hours he is coughing up brown sputum.     Objective:   Physical Exam  Constitutional: He is oriented to person, place, and time. He appears well-developed and well-nourished. No distress.       Febrile  HENT:  Head: Normocephalic and atraumatic.  Right Ear: External ear normal.  Left Ear: External ear normal.       Face symmetric, not tender to palpation, throat without redness or discharge  Cardiovascular: Normal rate, regular rhythm and normal heart sounds.   No murmur heard. Pulmonary/Chest: Effort normal and breath sounds normal. No respiratory distress. He has no rales.  Abdominal: Soft. He exhibits no distension. There is no tenderness. There is no rebound and no guarding.  Musculoskeletal: He exhibits no edema.  Neurological: He is alert and oriented to person, place, and time.  Skin: He is not diaphoretic.      Assessment & Plan:  Viral syndrome-- Flu test  negative, despite that I think he will benefit from Tamiflu (in case the test is a false negative)---> discussed with the patient he is in agreement. See instructions.

## 2011-04-26 NOTE — Patient Instructions (Signed)
Rest, fluids , tylenol For cough, take Mucinex DM twice a day as needed  Start tamiflu ASAP Call if no better in few days Call anytime if the symptoms are severe, you have high fever, short of breath   Influenza Facts Flu (influenza) is a contagious respiratory illness caused by the influenza viruses. It can cause mild to severe illness. While most healthy people recover from the flu without specific treatment and without complications, older people, young children, and people with certain health conditions are at higher risk for serious complications from the flu, including death. CAUSES   The flu virus is spread from person to person by respiratory droplets from coughing and sneezing.   A person can also become infected by touching an object or surface with a virus on it and then touching their mouth, eye or nose.   Adults may be able to infect others from 1 day before symptoms occur and up to 7 days after getting sick. So it is possible to give someone the flu even before you know you are sick and continue to infect others while you are sick.  SYMPTOMS   Fever (usually high).   Headache.   Tiredness (can be extreme).   Cough.   Sore throat.   Runny or stuffy nose.   Body aches.   Diarrhea and vomiting may also occur, particularly in children.   These symptoms are referred to as "flu-like symptoms". A lot of different illnesses, including the common cold, can have similar symptoms.  DIAGNOSIS   There are tests that can determine if you have the flu as long you are tested within the first 2 or 3 days of illness.   A doctor's exam and additional tests may be needed to identify if you have a disease that is a complicating the flu.  RISKS AND COMPLICATIONS  Some of the complications caused by the flu include:  Bacterial pneumonia or progressive pneumonia caused by the flu virus.   Loss of body fluids (dehydration).   Worsening of chronic medical conditions, such as heart  failure, asthma, or diabetes.   Sinus problems and ear infections.  HOME CARE INSTRUCTIONS   Seek medical care early on.   If you are at high risk from complications of the flu, consult your health-care provider as soon as you develop flu-like symptoms. Those at high risk for complications include:   People 65 years or older.   People with chronic medical conditions, including diabetes.   Pregnant women.   Young children.   Your caregiver may recommend use of an antiviral medication to help treat the flu.   If you get the flu, get plenty of rest, drink a lot of liquids, and avoid using alcohol and tobacco.   You can take over-the-counter medications to relieve the symptoms of the flu if your caregiver approves. (Never give aspirin to children or teenagers who have flu-like symptoms, particularly fever).  PREVENTION  The single best way to prevent the flu is to get a flu vaccine each fall. Other measures that can help protect against the flu are:  Antiviral Medications   A number of antiviral drugs are approved for use in preventing the flu. These are prescription medications, and a doctor should be consulted before they are used.   Habits for Good Health   Cover your nose and mouth with a tissue when you cough or sneeze, throw the tissue away after you use it.   Wash your hands often with soap and  water, especially after you cough or sneeze. If you are not near water, use an alcohol-based hand cleaner.   Avoid people who are sick.   If you get the flu, stay home from work or school. Avoid contact with other people so that you do not make them sick, too.   Try not to touch your eyes, nose, or mouth as germs ore often spread this way.  IN CHILDREN, EMERGENCY WARNING SIGNS THAT NEED URGENT MEDICAL ATTENTION:  Fast breathing or trouble breathing.   Bluish skin color.   Not drinking enough fluids.   Not waking up or not interacting.   Being so irritable that the child  does not want to be held.   Flu-like symptoms improve but then return with fever and worse cough.   Fever with a rash.  IN ADULTS, EMERGENCY WARNING SIGNS THAT NEED URGENT MEDICAL ATTENTION:  Difficulty breathing or shortness of breath.   Pain or pressure in the chest or abdomen.   Sudden dizziness.   Confusion.   Severe or persistent vomiting.  SEEK IMMEDIATE MEDICAL CARE IF:  You or someone you know is experiencing any of the symptoms above. When you arrive at the emergency center,report that you think you have the flu. You may be asked to wear a mask and/or sit in a secluded area to protect others from getting sick. MAKE SURE YOU:   Understand these instructions.   Monitor your condition.   Seek medical care if you are getting worse, or not improving.  Document Released: 05/05/2003 Document Revised: 01/12/2011 Document Reviewed: 01/29/2009 [facility] Patient Information 2012 [place], [place].

## 2011-09-29 ENCOUNTER — Encounter: Payer: Medicare Other | Admitting: Internal Medicine

## 2011-11-22 ENCOUNTER — Ambulatory Visit (INDEPENDENT_AMBULATORY_CARE_PROVIDER_SITE_OTHER): Payer: Medicare Other | Admitting: Internal Medicine

## 2011-11-22 ENCOUNTER — Encounter: Payer: Self-pay | Admitting: Internal Medicine

## 2011-11-22 VITALS — BP 110/68 | HR 62 | Temp 97.3°F | Resp 12 | Ht 75.03 in | Wt 166.8 lb

## 2011-11-22 DIAGNOSIS — Z0000 Encounter for general adult medical examination without abnormal findings: Secondary | ICD-10-CM

## 2011-11-22 DIAGNOSIS — K5730 Diverticulosis of large intestine without perforation or abscess without bleeding: Secondary | ICD-10-CM

## 2011-11-22 DIAGNOSIS — E782 Mixed hyperlipidemia: Secondary | ICD-10-CM

## 2011-11-22 DIAGNOSIS — I4891 Unspecified atrial fibrillation: Secondary | ICD-10-CM

## 2011-11-22 LAB — CBC WITH DIFFERENTIAL/PLATELET
Basophils Relative: 0.6 % (ref 0.0–3.0)
Eosinophils Absolute: 0.1 10*3/uL (ref 0.0–0.7)
Eosinophils Relative: 1.1 % (ref 0.0–5.0)
HCT: 38.5 % — ABNORMAL LOW (ref 39.0–52.0)
Hemoglobin: 12.6 g/dL — ABNORMAL LOW (ref 13.0–17.0)
Lymphs Abs: 1.6 10*3/uL (ref 0.7–4.0)
MCHC: 32.9 g/dL (ref 30.0–36.0)
MCV: 104.3 fl — ABNORMAL HIGH (ref 78.0–100.0)
Monocytes Absolute: 0.4 10*3/uL (ref 0.1–1.0)
Neutro Abs: 3.5 10*3/uL (ref 1.4–7.7)
RBC: 3.69 Mil/uL — ABNORMAL LOW (ref 4.22–5.81)

## 2011-11-22 LAB — BASIC METABOLIC PANEL
CO2: 30 mEq/L (ref 19–32)
Chloride: 100 mEq/L (ref 96–112)
Creatinine, Ser: 1 mg/dL (ref 0.4–1.5)
Potassium: 4.2 mEq/L (ref 3.5–5.1)
Sodium: 138 mEq/L (ref 135–145)

## 2011-11-22 LAB — LIPID PANEL
HDL: 76.7 mg/dL (ref >39.00)
Triglycerides: 65 mg/dL (ref 0.0–149.0)

## 2011-11-22 LAB — HEPATIC FUNCTION PANEL
ALT: 19 U/L (ref 0–53)
Albumin: 4.2 g/dL (ref 3.5–5.2)
Total Protein: 7.8 g/dL (ref 6.0–8.3)

## 2011-11-22 LAB — LDL CHOLESTEROL, DIRECT: Direct LDL: 102.4 mg/dL

## 2011-11-22 NOTE — Progress Notes (Signed)
Subjective:    Patient ID: Troy Willis, male    DOB: [birth year], 66 y.o.   MRN: [id number]  HPI Medicare Wellness Visit:  The following psychosocial & medical history were reviewed as required by Medicare.   Social history: caffeine: 2 cups & 1 cola daily, alcohol:  10 beers/ week,  tobacco use : quit 1982  & exercise : gym or CVE 3 X/ week for 60-120 min.   Home & personal  safety / fall risk:no issues, activities of daily living: no limitations , seatbelt use : yes , and smoke alarm employment : yes .  Power of Attorney/Living Will status : in place  Vision ( as recorded per Nurse) & Hearing  evaluation :  Ophth exam < 2 mos; no hearing evaluation. Orientation :oriented X3 , memory & recall :good,  math testing: good,and mood & affect : clinically normal . Depression / anxiety: not significant Travel history : [place] this year , immunization status :? Up to date , transfusion history: no, and preventive health surveillance ( colonoscopies, BMD , etc as per protocol/ [facility]): colonoscopy up to date, Dental care:  Every 2 years . Chart reviewed &  Updated. Active issues reviewed & addressed.       Review of Systems For the last month he's had pain @ medial R great toe; he is not treated this to date. He's also had pain in the R knee for < 1 week; he has worn support with benefit. Pain in L thumb  is a chronic problem. He was referred to rheumatologist a year ago; osteoarthritis was diagnosed. No specific treatment was instituted.  Sedimentation rate was 5 in 2012     Objective:   Physical Exam Gen.: Thin but healthy and well-nourished in appearance. Alert, appropriate and cooperative throughout exam. Head: Normocephalic without obvious abnormalities;  no alopecia ; moustache Eyes: No corneal or conjunctival inflammation noted.  Extraocular motion intact. Vision grossly normal with lenses. Ears: External  ear exam reveals no significant lesions or deformities. Canals clear .TMs normal.  Hearing is grossly normal bilaterally. Nose: External nasal exam reveals no deformity or inflammation. Nasal mucosa are pink and moist. No lesions or exudates noted. Septum  To R. Hyponasal speech  Mouth: Oral mucosa and oropharynx reveal no lesions or exudates. Teeth in good repair. Neck: No deformities, masses, or tenderness noted. Range of motion & Thyroid  normal. Lungs: Normal respiratory effort; chest expands symmetrically. Lungs are clear to auscultation without rales, wheezes, or increased work of breathing. Heart: Slow slightly irregular  rate and rhythm. No murmur. Abdomen: Bowel sounds normal; abdomen soft and nontender. No masses, organomegaly or hernias noted. Genitalia/DRE: Genitalia normal . Prostate is normal without enlargement, asymmetry, nodularity, or induration.                                           Musculoskeletal/extremities: No deformity or scoliosis noted of  the thoracic or lumbar spine. No clubbing, cyanosis, edema, or deformity noted. Slight fusiform changes R knee w/o definite effusion. Wendy Poet & strength  normal. Nail health  good. There appears to be a small ganglion along the medial aspect of the right great toe. Clinically neuroma is not suggested as it is not on the sole. Vascular: Carotid, radial artery, dorsalis pedis and  posterior tibial pulses are full and equal. No bruits present. Neurologic: Alert and oriented x3. Deep  tendon reflexes symmetrical and normal.          Skin: Intact without suspicious lesions or rashes. Lymph: No cervical, axillary, or inguinal lymphadenopathy present. Psych: Mood and affect are normal. Normally interactive                                                                                         Assessment & Plan:  #1 Medicare Wellness Exam; criteria met ; data entered #2 Problem List reviewed ; Assessment/ Recommendations made  #3 thumb, knee, and toe issues. These appear to be 3 isolated issues rather than a systemic  process such as rheumatoid arthritis. I have recommended he follow with Dr. Janee Morn, Orthopedist in [facility]  Plan: see Orders

## 2011-11-22 NOTE — Patient Instructions (Addendum)
Preventive Health Care: Exercise at least 30-45 minutes a day,  3-4 days a week.  Eat a low-fat diet with lots of fruits and vegetables, up to 7-9 servings per day.  Consume less than 40 grams of sugar per day from foods & drinks with High Fructose Corn Sugar as #1,2,3 or # 4 on label. Eye Doctor - have an eye exam @ least annually.                                                         Please try to go on My Chart within the next 24 hours to allow me to release the results directly to you.

## 2011-11-23 ENCOUNTER — Encounter: Payer: Self-pay | Admitting: Internal Medicine

## 2011-11-23 ENCOUNTER — Other Ambulatory Visit (INDEPENDENT_AMBULATORY_CARE_PROVIDER_SITE_OTHER): Payer: Medicare Other

## 2011-11-23 DIAGNOSIS — D649 Anemia, unspecified: Secondary | ICD-10-CM

## 2011-11-23 LAB — CBC WITH DIFFERENTIAL/PLATELET
Basophils Absolute: 0 10*3/uL (ref 0.0–0.1)
Basophils Relative: 0.6 % (ref 0.0–3.0)
HCT: 36.8 % — ABNORMAL LOW (ref 39.0–52.0)
Hemoglobin: 12.1 g/dL — ABNORMAL LOW (ref 13.0–17.0)
Lymphocytes Relative: 32 % (ref 12.0–46.0)
Lymphs Abs: 1.5 10*3/uL (ref 0.7–4.0)
MCHC: 32.8 g/dL (ref 30.0–36.0)
Monocytes Relative: 10.4 % (ref 3.0–12.0)
Neutro Abs: 2.7 10*3/uL (ref 1.4–7.7)
RBC: 3.53 Mil/uL — ABNORMAL LOW (ref 4.22–5.81)
RDW: 15.2 % — ABNORMAL HIGH (ref 11.5–14.6)

## 2011-11-23 NOTE — Progress Notes (Signed)
Labs only

## 2011-11-25 LAB — IBC PANEL
Iron: 78 ug/dL (ref 42–165)
Saturation Ratios: 22.6 % (ref 20.0–50.0)
Transferrin: 246.7 mg/dL (ref 212.0–360.0)

## 2011-11-28 ENCOUNTER — Encounter: Payer: Self-pay | Admitting: Internal Medicine

## 2011-11-28 ENCOUNTER — Ambulatory Visit (INDEPENDENT_AMBULATORY_CARE_PROVIDER_SITE_OTHER): Payer: Medicare Other | Admitting: Internal Medicine

## 2011-11-28 VITALS — BP 120/84 | HR 75 | Wt 169.0 lb

## 2011-11-28 DIAGNOSIS — D649 Anemia, unspecified: Secondary | ICD-10-CM

## 2011-11-28 NOTE — Progress Notes (Signed)
  Subjective:    Patient ID: Troy Willis, male    DOB: 1946-01-25, 66 y.o.   MRN: [id number]  HPI He returns to discuss the anemia; hematocrit was 38.5 on 11/22/11. MCV was 104.3. Repeat hematocrit was 36.9 on 7/10. Despite the macrocytosis; folate and B12 levels were normal. Iron level was also normal at 78.  He formally was a blood donor but has donated blood only twice in the last 2 years.  He believes his last colonoscopy was in 2006 and was negative.  For the last month he's noted some pill dysphagia with his multivitamins; he has no food dysphasia. He's never had an endoscopy.  Family history is negative for any gastroenterologic disease except for dyspepsia in his father & 2 sisters. There is no family history or personal history of hemoglobinopathy    Review of Systems his weight loss has been purposeful related to positive lifestyle changes. He denies abdominal pain, melena, rectal bleeding, or change in caliber of the stools. Despite some arthralgias; he denies intake of excess nonsteroidals.  He does have some nocturnal leg cramps; his potassium and calcium were normal.  He also describes some decreased libido; he defers a fasting screening testosterone at this time    Objective:   Physical Exam General appearance : thin ;in good health and well nourished  w/o distress.  Eyes: No conjunctival inflammation or scleral icterus is present.  Oral exam: Dental hygiene is good; lips and gums are healthy appearing.There is no oropharyngeal erythema or exudate noted.   Heart:  Normal rate and regular rhythm. Split S2 normal without gallop, murmur, click, rub or other extra sounds     Lungs:Chest clear to auscultation; no wheezes, rhonchi,rales ,or rubs present.No increased work of breathing.   Abdomen: bowel sounds normal, soft and non-tender without masses, organomegaly or hernias noted.  No guarding or rebound   Skin:Warm & dry.  Intact without suspicious lesions or rashes ;  no jaundice or tenting  Lymphatic: No lymphadenopathy is noted about the head, neck, axilla              Assessment & Plan:  #1 macrocytic anemia, mild with normal B12 and folate levels.  #2 pill dysphagia  Plan: Stool cards (pending). I would recommend GI consultation; an upper endoscopy should be considered with  his clinical picture.

## 2011-11-28 NOTE — Patient Instructions (Addendum)
Please call if there is a significant change in symptoms  or progression of severity of symptoms compared to the  History as recorded in the copy of the office note you were provided. Review that record for accuracy.Share results with all MDs seen . Please perform isometric exercises before going to bed; MagCal may help the muscle symptoms as we discussed.

## 2011-11-29 ENCOUNTER — Encounter: Payer: Self-pay | Admitting: Internal Medicine

## 2011-11-30 ENCOUNTER — Other Ambulatory Visit (INDEPENDENT_AMBULATORY_CARE_PROVIDER_SITE_OTHER): Payer: Medicare Other

## 2011-11-30 DIAGNOSIS — Z1289 Encounter for screening for malignant neoplasm of other sites: Secondary | ICD-10-CM

## 2011-11-30 NOTE — Progress Notes (Signed)
Lab only 

## 2012-01-02 ENCOUNTER — Ambulatory Visit: Payer: Medicare Other | Admitting: Internal Medicine

## 2012-01-31 ENCOUNTER — Ambulatory Visit: Payer: Medicare Other | Admitting: Internal Medicine

## 2012-02-03 ENCOUNTER — Encounter: Payer: Self-pay | Admitting: Internal Medicine

## 2012-02-03 ENCOUNTER — Ambulatory Visit (INDEPENDENT_AMBULATORY_CARE_PROVIDER_SITE_OTHER): Payer: Medicare Other | Admitting: Internal Medicine

## 2012-02-03 ENCOUNTER — Other Ambulatory Visit (INDEPENDENT_AMBULATORY_CARE_PROVIDER_SITE_OTHER): Payer: Medicare Other

## 2012-02-03 VITALS — BP 118/64 | HR 62 | Ht 75.25 in | Wt 166.0 lb

## 2012-02-03 DIAGNOSIS — D531 Other megaloblastic anemias, not elsewhere classified: Secondary | ICD-10-CM

## 2012-02-03 DIAGNOSIS — R1310 Dysphagia, unspecified: Secondary | ICD-10-CM

## 2012-02-03 DIAGNOSIS — D539 Nutritional anemia, unspecified: Secondary | ICD-10-CM

## 2012-02-03 LAB — CBC WITH DIFFERENTIAL/PLATELET
Basophils Relative: 0.5 % (ref 0.0–3.0)
Eosinophils Absolute: 0 10*3/uL (ref 0.0–0.7)
Eosinophils Relative: 0.7 % (ref 0.0–5.0)
Lymphocytes Relative: 34.1 % (ref 12.0–46.0)
MCV: 104.6 fl — ABNORMAL HIGH (ref 78.0–100.0)
Monocytes Absolute: 0.4 10*3/uL (ref 0.1–1.0)
Neutrophils Relative %: 57.3 % (ref 43.0–77.0)
Platelets: 161 10*3/uL (ref 150.0–400.0)
RBC: 3.57 Mil/uL — ABNORMAL LOW (ref 4.22–5.81)
WBC: 5.5 10*3/uL (ref 4.5–10.5)

## 2012-02-03 NOTE — Progress Notes (Addendum)
Subjective:    Patient ID: Troy Willis, male    DOB: 11-25-1945, 66 y.o.   MRN: [id number]  HPI This is a very pleasant 66 year old white man I know from previous colonoscopy in 2006, it showed diverticulosis and mixed hemorrhoids. He was found to be mildly anemic with macrocytosis and complained of some pill dysphagia and heartburn symptoms recently. He does describe intermittent heartburn, sometimes at night, he will sleep on 2 pillows to help with this. He has pill dysphagia but also has had some occasional episodes of solid food sticking in the suprasternal notch. Reflux is also triggered by bending over at times. Also complains of some bloating and gas symptoms at times. All other GI review of systems are negative at this time.  Medications, allergies, past medical history, past surgical history, family history and social history are reviewed and updated in the EMR.  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 Leg cramps at night as well at times. All other review of systems negative or as per history of present illness.    Objective:   Physical Exam General:  Well-developed, well-nourished and in no acute distress Eyes:  anicteric. ENT:   Mouth and posterior pharynx free of lesions.  Neck:   supple w/o thyromegaly or mass.  Lungs: Clear to auscultation bilaterally. Heart:  S1S2, no rubs, murmurs, gallops. Abdomen:  soft, non-tender, no hepatosplenomegaly, hernia, or mass and BS+.  Lymph:  no cervical or supraclavicular adenopathy. Extremities:   no edema Skin   no rash. Neuro:  A&O x 3.  Psych:  appropriate mood and  Affect.   Data Reviewed: Lab Results  Component Value Date   WBC 4.8 11/23/2011   HGB 12.1* 11/23/2011   HCT 36.8* 11/23/2011   MCV 104.1* 11/23/2011   PLT 180.0 11/23/2011   Lab Results  Component  Value Date   VITAMINB12 637 11/23/2011   Lab Results  Component Value Date   FOLATE >24.8 11/23/2011   Lab Results  Component Value Date   IRON 78 11/23/2011        Assessment & Plan:   1. Dysphagia  Ambulatory referral to Gastroenterology, CBC with Differential  2. Megaloblastic anemia  Ambulatory referral to Gastroenterology, CBC with Differential   1. He has some dysphagia to pills and solids, heart or pulled a history of solid dysphagia. There is some heartburn and reflux as well. Because of this an upper endoscopy with possible suction dilation will be scheduled.The risks and benefits as well as alternatives of endoscopic procedure(s) have been discussed and reviewed. All questions answered. The patient agrees to proceed. He will also try to reduce caffeine. I meant to give him a GERD diet when he was here but we'll do that when he returns for endoscopy. 2. He has a mild megaloblastic anemia. Iron studies B12 and folate are unrevealing. Will repeat a CBC. It's possible but unlikely something would be revealed on the upper endoscopy that's related to this. He drinks several times a week but it doesn't sound excessive, he does report decreased libido and was offered testosterone testing but side not to pursue that this summer when he was at Dr. Frederik Pear office. It might be reasonable to go ahead and check a testosterone level as sometimes a mild anemia it can be associated with low testosterone. Will defer to Dr. Alwyn Ren  I  appreciate the opportunity to care for this patient.  CC: Marga Melnick, MD

## 2012-02-03 NOTE — Patient Instructions (Addendum)
  Your physician has requested that you go to the basement for the following lab work before leaving today:  CBC   You have been scheduled for an endoscopy with propofol. Please follow written instructions given to you at your visit today. If you use inhalers (even only as needed), please bring them with you on the day of your procedure.

## 2012-02-05 NOTE — Progress Notes (Signed)
Quick Note:  Hgb stable Will review at 9/23 EGD ______

## 2012-02-06 ENCOUNTER — Ambulatory Visit (AMBULATORY_SURGERY_CENTER): Payer: Medicare Other | Admitting: Internal Medicine

## 2012-02-06 ENCOUNTER — Encounter: Payer: Self-pay | Admitting: Internal Medicine

## 2012-02-06 VITALS — BP 134/97 | HR 79 | Temp 96.7°F | Resp 16 | Ht 75.0 in | Wt 166.0 lb

## 2012-02-06 DIAGNOSIS — K209 Esophagitis, unspecified: Secondary | ICD-10-CM

## 2012-02-06 DIAGNOSIS — D649 Anemia, unspecified: Secondary | ICD-10-CM

## 2012-02-06 DIAGNOSIS — R1310 Dysphagia, unspecified: Secondary | ICD-10-CM

## 2012-02-06 MED ORDER — SODIUM CHLORIDE 0.9 % IV SOLN: 500.0000 mL | INTRAVENOUS | Status: DC

## 2012-02-06 NOTE — Op Note (Signed)
 [facility] [address] [place], [address]   ENDOSCOPY PROCEDURE REPORT  PATIENT: Troy Willis, Troy Willis  MR#: [id number] BIRTHDATE: 10-15-1945 , 66  yrs. old GENDER: Male ENDOSCOPIST: Iva Boop, MD, [facility] REFERRED BY: PROCEDURE DATE:  02/06/2012 PROCEDURE:  EGD w/ biopsy ASA CLASS:     Class II INDICATIONS:  dysphagia.   anemia. MEDICATIONS: propofol (Diprivan) 100mg  IV, MAC sedation, administered by CRNA, and These medications were titrated to patient response per physician's verbal order TOPICAL ANESTHETIC: none  DESCRIPTION OF PROCEDURE: After the risks benefits and alternatives of the procedure were thoroughly explained, informed consent was obtained.  The LB [id number]-H180 [id number] endoscope was introduced through the mouth and advanced to the second portion of the duodenum. Without limitations.  The instrument was slowly withdrawn as the mucosa was fully examined.      ESOPHAGUS: A circumferential patch of abnormal mucosa was found in the middle third of the esophagus.  White plaques.  Multiple biopsies were performed.  Sample sent for histology.  The remainder of the upper endoscopy exam was otherwise normal. Retroflexed views revealed no abnormalities.     The scope was then withdrawn from the patient and the procedure completed.  COMPLICATIONS: There were no complications. ENDOSCOPIC IMPRESSION: 1.   Circumferential abnormal mucosa with white plaques in the middle third of the esophagus; multiple biopsies 2.   The remainder of the upper endoscopy exam was otherwise normal  RECOMMENDATIONS: 1.  await pathology results 2.  Office will call with results 3.   follow-up with Dr. Alwyn Ren re: persistent mild megaloblastic anemia    eSigned:  Iva Boop, MD, [facility] 02/06/2012 4:52 PM   XB:[id number] Minerva Ends, MD and The Patient

## 2012-02-06 NOTE — Progress Notes (Signed)
Patient did not experience any of the following events: a burn prior to discharge; a fall within the facility; wrong site/side/patient/procedure/implant event; or a hospital transfer or hospital admission upon discharge from the facility. (G8907) Patient did not have preoperative order for IV antibiotic SSI prophylaxis. (G8918)  

## 2012-02-06 NOTE — Patient Instructions (Addendum)
Your upper endoscopy exam showed possible infection in the esophagus. I took biopsies and will let you know the results, should be by end of the week.  Thank you for choosing me and [facility].  Iva Boop, MD, FACG YOU HAD AN ENDOSCOPIC PROCEDURE TODAY AT THE [facility]: Refer to the procedure report that was given to you for any specific questions about what was found during the examination.  If the procedure report does not answer your questions, please call your gastroenterologist to clarify.  If you requested that your care partner not be given the details of your procedure findings, then the procedure report has been included in a sealed envelope for you to review at your convenience later.  YOU SHOULD EXPECT: Some feelings of bloating in the abdomen. Passage of more gas than usual.  Walking can help get rid of the air that was put into your GI tract during the procedure and reduce the bloating. If you had a lower endoscopy (such as a colonoscopy or flexible sigmoidoscopy) you may notice spotting of blood in your stool or on the toilet paper. If you underwent a bowel prep for your procedure, then you may not have a normal bowel movement for a few days.  DIET: Your first meal following the procedure should be a light meal and then it is ok to progress to your normal diet.  A half-sandwich or bowl of soup is an example of a good first meal.  Heavy or fried foods are harder to digest and may make you feel nauseous or bloated.  Likewise meals heavy in dairy and vegetables can cause extra gas to form and this can also increase the bloating.  Drink plenty of fluids but you should avoid alcoholic beverages for 24 hours.  ACTIVITY: Your care partner should take you home directly after the procedure.  You should plan to take it easy, moving slowly for the rest of the day.  You can resume normal activity the day after the procedure however you should NOT DRIVE or use heavy  machinery for 24 hours (because of the sedation medicines used during the test).    SYMPTOMS TO REPORT IMMEDIATELY: A gastroenterologist can be reached at any hour.  During normal business hours, 8:30 AM to 5:00 PM Monday through Friday, call [phone].  After hours and on weekends, please call the GI answering service at [phone] who will take a message and have the physician on call contact you.   Following lower endoscopy (colonoscopy or flexible sigmoidoscopy):  Excessive amounts of blood in the stool  Significant tenderness or worsening of abdominal pains  Swelling of the abdomen that is new, acute  Fever of 100F or higher  Following upper endoscopy (EGD)  Vomiting of blood or coffee ground material  New chest pain or pain under the shoulder blades  Painful or persistently difficult swallowing  New shortness of breath  Fever of 100F or higher  Black, tarry-looking stools  FOLLOW UP: If any biopsies were taken you will be contacted by phone or by letter within the next 1-3 weeks.  Call your gastroenterologist if you have not heard about the biopsies in 3 weeks.  Our staff will call the home number listed on your records the next business day following your procedure to check on you and address any questions or concerns that you may have at that time regarding the information given to you following your procedure. This is a courtesy call and so  if there is no answer at the home number and we have not heard from you through the emergency physician on call, we will assume that you have returned to your regular daily activities without incident.  SIGNATURES/CONFIDENTIALITY: You and/or your care partner have signed paperwork which will be entered into your electronic medical record.  These signatures attest to the fact that that the information above on your After Visit Summary has been reviewed and is understood.  Full responsibility of the confidentiality of this discharge  information lies with you and/or your care-partner.

## 2012-02-07 ENCOUNTER — Telehealth: Payer: Self-pay | Admitting: *Deleted

## 2012-02-07 NOTE — Telephone Encounter (Signed)
  Follow up Call-  Call back number 02/06/2012  Post procedure Call Back phone  # [phone]  Permission to leave phone message Yes     Patient questions:  Do you have a fever, pain , or abdominal swelling? no Pain Score  0 *  Have you tolerated food without any problems? yes  Have you been able to return to your normal activities? yes  Do you have any questions about your discharge instructions: Diet   no Medications  no Follow up visit  no  Do you have questions or concerns about your Care? no  Actions: * If pain score is 4 or above: No action needed, pain <4.

## 2012-02-08 ENCOUNTER — Encounter: Payer: Self-pay | Admitting: Internal Medicine

## 2012-02-14 NOTE — Progress Notes (Signed)
Quick Note:  There was some inflammatory changes but no infection ? If pills getting stuck here  Please order a barium swallow with tablet re: dysphagia  [facility] - no letter or recall ______

## 2012-02-15 ENCOUNTER — Telehealth: Payer: Self-pay | Admitting: Internal Medicine

## 2012-02-15 ENCOUNTER — Other Ambulatory Visit: Payer: Self-pay

## 2012-02-15 DIAGNOSIS — R1310 Dysphagia, unspecified: Secondary | ICD-10-CM

## 2012-02-16 NOTE — Telephone Encounter (Signed)
Patient wants to reschedule after Mid November.  He reports he will call me back to reschedule

## 2012-02-20 ENCOUNTER — Other Ambulatory Visit (HOSPITAL_COMMUNITY): Payer: Medicare Other

## 2012-03-23 ENCOUNTER — Encounter: Payer: Self-pay | Admitting: Internal Medicine

## 2012-03-26 NOTE — Telephone Encounter (Signed)
I spoke with the patient and explained the bx results again and the reason for the barium swallow (see path report).  He reports that he is not having any problems with dysphagia currently and the pills getting stuck only happened 2 times in a 10 year period.  He is asking if the barium swallow is still needed at this point?  Dr. Leone Payor please advise

## 2012-05-14 ENCOUNTER — Ambulatory Visit (INDEPENDENT_AMBULATORY_CARE_PROVIDER_SITE_OTHER): Payer: Medicare Other | Admitting: Family Medicine

## 2012-05-14 ENCOUNTER — Telehealth: Payer: Self-pay | Admitting: Internal Medicine

## 2012-05-14 ENCOUNTER — Encounter: Payer: Self-pay | Admitting: Family Medicine

## 2012-05-14 VITALS — BP 118/70 | HR 106 | Temp 97.3°F | Wt 170.4 lb

## 2012-05-14 DIAGNOSIS — J209 Acute bronchitis, unspecified: Secondary | ICD-10-CM

## 2012-05-14 MED ORDER — AZITHROMYCIN 250 MG PO TABS: ORAL_TABLET | ORAL | Status: DC

## 2012-05-14 MED ORDER — BENZONATATE 200 MG PO CAPS: 200.0000 mg | ORAL_CAPSULE | Freq: Three times a day (TID) | ORAL | Status: DC | PRN

## 2012-05-14 MED ORDER — GUAIFENESIN-CODEINE 100-10 MG/5ML PO SYRP: 10.0000 mL | ORAL_SOLUTION | Freq: Three times a day (TID) | ORAL | Status: DC | PRN

## 2012-05-14 NOTE — Patient Instructions (Addendum)
This is a bronchitis Start the Zpack as directed Use the cough syrup as needed- may make you drowsy- cough pills during the day Drink plenty of fluids Mucinex to thin your chest congestion REST! Hang in there! Happy New Year!

## 2012-05-14 NOTE — Telephone Encounter (Signed)
[facility] Triage Call Report Triage Record Num: [id number] Operator: Lesli Albee Patient Name: Troy Willis Call Date & Time: 05/12/2012 1:42:19PM Patient Phone: [phone] PCP: Patient Gender: Male PCP Fax : Patient DOB: 1945/09/07 Practice Name: [facility] Reason for Call: Caller: [name]/Patient; PCP: Marga Melnick; CB#: [phone]; Call regarding Chest Pain/Chest Discomfort; Pt has had a cough/cold x 1 week. Pt states he is coughing up brown in the mornings. Otherwise afebrile. RN advised uc . Protocol(s) Used: Upper Respiratory Infection (URI) Recommended Outcome per Protocol: See Provider within 24 hours Reason for Outcome: Productive cough with colored sputum (other than clear or white sputum) Care Advice: ~ SYMPTOM / CONDITION MANAGEMENT

## 2012-05-14 NOTE — Assessment & Plan Note (Signed)
New.  Due to duration of illness w/out improvement, start abx.  Cough meds prn.  Reviewed supportive care and red flags that should prompt return.  Pt expressed understanding and is in agreement w/ plan.

## 2012-05-14 NOTE — Progress Notes (Signed)
  Subjective:    Patient ID: Troy Willis, male    DOB: 01-22-1946, 66 y.o.   MRN: [id number]  HPI URI- sxs started 8 days ago, 'has not gotten any better, i'm coughing up junk.  No energy whatsoever'.  No fevers.  No facial pain/pressure.  Bilateral ear fullness.  No sore throat.  No known sick contacts.  No nausea/vomiting/diarrhea.  Minimal nasal congestion.   Review of Systems For ROS see HPI     Objective:   Physical Exam  Vitals reviewed. Constitutional: He appears well-developed and well-nourished. No distress.  HENT:  Head: Normocephalic and atraumatic.  Right Ear: Tympanic membrane normal.  Left Ear: Tympanic membrane normal.  Nose: No mucosal edema or rhinorrhea. Right sinus exhibits no maxillary sinus tenderness and no frontal sinus tenderness. Left sinus exhibits no maxillary sinus tenderness and no frontal sinus tenderness.  Mouth/Throat: Mucous membranes are normal. No oropharyngeal exudate, posterior oropharyngeal edema or posterior oropharyngeal erythema.  Eyes: Conjunctivae normal and EOM are normal. Pupils are equal, round, and reactive to light.  Neck: Normal range of motion. Neck supple.  Cardiovascular: Normal rate, regular rhythm and normal heart sounds.   Pulmonary/Chest: Effort normal and breath sounds normal. No respiratory distress. He has no wheezes.       + hacking cough  Lymphadenopathy:    He has no cervical adenopathy.  Skin: Skin is warm and dry.          Assessment & Plan:

## 2012-05-14 NOTE — Telephone Encounter (Signed)
Apparently referred to [facility] Sat 05/12/12

## 2012-05-18 ENCOUNTER — Ambulatory Visit (HOSPITAL_BASED_OUTPATIENT_CLINIC_OR_DEPARTMENT_OTHER)
Admission: RE | Admit: 2012-05-18 | Discharge: 2012-05-18 | Disposition: A | Discharge status: Home / Self Care | Payer: Medicare Other | Source: Ambulatory Visit | Attending: Family Medicine | Admitting: Family Medicine

## 2012-05-18 ENCOUNTER — Ambulatory Visit (INDEPENDENT_AMBULATORY_CARE_PROVIDER_SITE_OTHER): Payer: Medicare Other | Admitting: Family Medicine

## 2012-05-18 ENCOUNTER — Encounter: Payer: Self-pay | Admitting: Family Medicine

## 2012-05-18 ENCOUNTER — Encounter: Payer: Self-pay | Admitting: Internal Medicine

## 2012-05-18 VITALS — BP 112/60 | HR 64 | Temp 98.1°F | Resp 16 | Wt 166.0 lb

## 2012-05-18 DIAGNOSIS — J159 Unspecified bacterial pneumonia: Secondary | ICD-10-CM | POA: Insufficient documentation

## 2012-05-18 DIAGNOSIS — R05 Cough: Secondary | ICD-10-CM | POA: Insufficient documentation

## 2012-05-18 DIAGNOSIS — R0602 Shortness of breath: Secondary | ICD-10-CM | POA: Insufficient documentation

## 2012-05-18 DIAGNOSIS — R059 Cough, unspecified: Secondary | ICD-10-CM | POA: Insufficient documentation

## 2012-05-18 HISTORY — DX: Unspecified bacterial pneumonia: J15.9

## 2012-05-18 MED ORDER — MOXIFLOXACIN HCL 400 MG PO TABS: 400.0000 mg | ORAL_TABLET | Freq: Every day | ORAL | Status: DC

## 2012-05-18 MED ORDER — ALBUTEROL SULFATE HFA 108 (90 BASE) MCG/ACT IN AERS: 2.0000 | INHALATION_SPRAY | Freq: Four times a day (QID) | RESPIRATORY_TRACT | Status: DC | PRN

## 2012-05-18 MED ORDER — ALBUTEROL SULFATE (2.5 MG/3ML) 0.083% IN NEBU
2.5000 mg | INHALATION_SOLUTION | Freq: Once | RESPIRATORY_TRACT | Status: AC
  Administered 2012-05-18: 2.5 mg via RESPIRATORY_TRACT

## 2012-05-18 NOTE — Progress Notes (Signed)
  Subjective:    Patient ID: Troy Willis, male    DOB: 1946-01-24, 67 y.o.   MRN: [id number]  HPI URI- pt reports 'i'm feeling better but not a whole not better'.  Coughing less but cough is still productive.  Still some SOB, this is now 2 weeks.  + fatigue.  No fevers.  + sinus congestion/pressure- sinuses now productive.  No tooth pain.  Bilateral ear fullness.  No N/V/D.  Completed Zpack.   Review of Systems For ROS see HPI     Objective:   Physical Exam  Constitutional: He appears well-developed and well-nourished. No distress.  HENT:  Head: Normocephalic and atraumatic.  Right Ear: Tympanic membrane normal.  Left Ear: Tympanic membrane normal.  Nose: No mucosal edema or rhinorrhea. Right sinus exhibits no maxillary sinus tenderness and no frontal sinus tenderness. Left sinus exhibits no maxillary sinus tenderness and no frontal sinus tenderness.  Mouth/Throat: Mucous membranes are normal. No oropharyngeal exudate, posterior oropharyngeal edema or posterior oropharyngeal erythema.  Eyes: Conjunctivae normal and EOM are normal. Pupils are equal, round, and reactive to light.  Neck: Normal range of motion. Neck supple.  Cardiovascular: Normal rate, regular rhythm and normal heart sounds.   Pulmonary/Chest: Effort normal. No respiratory distress. He has no wheezes.       + hacking cough Initially w/ diffuse expiratory wheezes- these improved s/p albuterol neb, allowing me to hear LLL crackles  Lymphadenopathy:    He has no cervical adenopathy.  Skin: Skin is warm and dry.          Assessment & Plan:

## 2012-05-18 NOTE — Telephone Encounter (Signed)
Spoke w/pt. He is currently not feeling well (had acute visit today w/Dr. Beverely Low) and will call for lab appointment when he is feeling better.

## 2012-05-18 NOTE — Patient Instructions (Addendum)
Go to the [facility] on [address]- [address] on [address], [place] [address] on [address] the [place] daily Use the albuterol inhaler- 2 puffs every 4 hrs as needed for cough, wheezing, SOB REST!  This takes awhile to recover! Drink plenty of fluids Call with any questions or concerns Hang in there!

## 2012-05-20 NOTE — Assessment & Plan Note (Signed)
New.  Clinical exam is consistent w/ LLL PNA.  Will get CXR to confirm.  Continue cough meds.  Switch abx to avelox.  Reviewed supportive care and red flags that should prompt return.  Pt expressed understanding and is in agreement w/ plan.

## 2012-05-24 ENCOUNTER — Encounter: Payer: Self-pay | Admitting: Family Medicine

## 2012-05-25 ENCOUNTER — Encounter: Payer: Self-pay | Admitting: Family Medicine

## 2012-06-29 ENCOUNTER — Other Ambulatory Visit: Payer: Medicare Other

## 2012-06-30 ENCOUNTER — Other Ambulatory Visit: Payer: Self-pay

## 2012-07-02 ENCOUNTER — Other Ambulatory Visit (INDEPENDENT_AMBULATORY_CARE_PROVIDER_SITE_OTHER): Payer: Medicare Other

## 2012-07-02 DIAGNOSIS — D649 Anemia, unspecified: Secondary | ICD-10-CM

## 2012-07-03 ENCOUNTER — Ambulatory Visit: Payer: Medicare Other | Admitting: Internal Medicine

## 2012-07-03 LAB — CBC WITH DIFFERENTIAL/PLATELET
Basophils Relative: 0.3 % (ref 0.0–3.0)
Eosinophils Absolute: 0.1 10*3/uL (ref 0.0–0.7)
Eosinophils Relative: 2.1 % (ref 0.0–5.0)
HCT: 36 % — ABNORMAL LOW (ref 39.0–52.0)
Lymphs Abs: 1.3 10*3/uL (ref 0.7–4.0)
MCHC: 33.7 g/dL (ref 30.0–36.0)
MCV: 101 fl — ABNORMAL HIGH (ref 78.0–100.0)
Monocytes Absolute: 0.5 10*3/uL (ref 0.1–1.0)
Platelets: 219 10*3/uL (ref 150.0–400.0)
RBC: 3.57 Mil/uL — ABNORMAL LOW (ref 4.22–5.81)
WBC: 6.6 10*3/uL (ref 4.5–10.5)

## 2012-07-09 ENCOUNTER — Encounter: Payer: Self-pay | Admitting: Internal Medicine

## 2012-07-09 ENCOUNTER — Ambulatory Visit (HOSPITAL_BASED_OUTPATIENT_CLINIC_OR_DEPARTMENT_OTHER)
Admission: RE | Admit: 2012-07-09 | Discharge: 2012-07-09 | Disposition: A | Discharge status: Home / Self Care | Payer: Medicare Other | Source: Ambulatory Visit | Attending: Internal Medicine | Admitting: Internal Medicine

## 2012-07-09 ENCOUNTER — Ambulatory Visit (INDEPENDENT_AMBULATORY_CARE_PROVIDER_SITE_OTHER): Payer: Medicare Other | Admitting: Internal Medicine

## 2012-07-09 VITALS — BP 116/70 | HR 76 | Wt 171.2 lb

## 2012-07-09 DIAGNOSIS — J189 Pneumonia, unspecified organism: Secondary | ICD-10-CM | POA: Insufficient documentation

## 2012-07-09 DIAGNOSIS — R0609 Other forms of dyspnea: Secondary | ICD-10-CM | POA: Insufficient documentation

## 2012-07-09 DIAGNOSIS — R0989 Other specified symptoms and signs involving the circulatory and respiratory systems: Secondary | ICD-10-CM | POA: Insufficient documentation

## 2012-07-09 DIAGNOSIS — Z23 Encounter for immunization: Secondary | ICD-10-CM

## 2012-07-09 DIAGNOSIS — I4891 Unspecified atrial fibrillation: Secondary | ICD-10-CM

## 2012-07-09 DIAGNOSIS — D649 Anemia, unspecified: Secondary | ICD-10-CM

## 2012-07-09 NOTE — Patient Instructions (Addendum)
Please complete and return stool cards; these will determine whether there is any gastrointestinal bleeding risk. Order for x-rays entered into  the computer; these will be performed at [facility]. No appointment is necessary.

## 2012-07-09 NOTE — Progress Notes (Signed)
  Subjective:    Patient ID: Troy Willis, male    DOB: 1946-03-01, 67 y.o.   MRN: [id number]  HPI  He continues to have some exertional dyspnea after climbing 2 flights of stairs. He is able to climb the stairs in his home up to 20 times a day. The chest x-ray 05/18/12 was reviewed. He did have mild, ill-defined infiltrates in the bases  He continues to demonstrate a mild anemia. Hematocrit is at its lowest at this time with a value of 36. 7 months ago was 38.5. The MCV has been as high as 104.1.  Past medical history/family history/social history were all reviewed and updated. Pertinent data:Diverticulosis @ colonoscopy 2006.   Review of Systems He denies epistaxis, hemoptysis, hematuria, melena, or rectal bleeding.He has no unexplained weight loss, dysphagia, or abdominal pain. He has no abnormal bruising or bleeding. He has no difficulty stopping bleeding with injury.     Objective:   Physical Exam Gen.: Thin but  well-nourished in appearance. Alert, appropriate and cooperative throughout exam. Eyes: No corneal or conjunctival inflammation noted. No icterus. Nose: External nasal exam reveals no deformity or inflammation. Nasal mucosa are pink and moist. No lesions or exudates noted.   Mouth: Oral mucosa and oropharynx reveal no lesions or exudates. Teeth in good repair. Neck: No deformities, masses, or tenderness noted.  Thyroid normalir Lungs: Normal respiratory effort; chest expands symmetrically. Lungs are clear to auscultation without rales, wheezes, or increased work of breathing. Heart: Normal rate and irregular rhythm. Normal S1 and S2. No gallop, click, or rub. Flow murmur. Abdomen: Bowel sounds normal; abdomen soft and nontender. No masses, organomegaly or hernias noted.                             Musculoskeletal/extremities: No deformity or scoliosis noted of  the thoracic or lumbar spine.  No clubbing, cyanosis, edema, or significant extremity  deformity noted. Tone &  strength  Normal. 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 Nail health good. Able to lie down & sit up w/o help.  Vascular: Carotid, radial artery, dorsalis pedis and  posterior tibial pulses are full and equal.  Neurologic: Alert and oriented x3.         Skin: Intact without suspicious lesions or rashes. Lymph: No cervical, axillary lymphadenopathy present. Psych: Mood and affect are normal. Normally interactive                                                                                        Assessment & Plan:  #1 exertional dyspnea in the setting of prior pneumonia. Pneumonia vaccine recommended. Followup chest x-ray recommended to serve as a baseline for future comparison.  #2 anemia. B12,  iron panel, and stool cards will be checked. He is not due for colonoscopy until 2016.

## 2012-07-16 ENCOUNTER — Telehealth: Payer: Self-pay | Admitting: Internal Medicine

## 2012-07-16 MED ORDER — ZOSTER VACCINE LIVE 19400 UNT/0.65ML ~~LOC~~ SOLR: 0.6500 mL | Freq: Once | SUBCUTANEOUS | Status: DC

## 2012-07-16 NOTE — Telephone Encounter (Signed)
Patient would like shingles vaccine sent to [facility] on [address]. He has verified coverage w/insurance.

## 2012-07-16 NOTE — Telephone Encounter (Signed)
Please change pharmacy to [facility] on [address].

## 2012-07-16 NOTE — Telephone Encounter (Signed)
RX sent to [facility].

## 2012-08-07 ENCOUNTER — Encounter: Payer: Self-pay | Admitting: Internal Medicine

## 2012-08-07 ENCOUNTER — Other Ambulatory Visit (INDEPENDENT_AMBULATORY_CARE_PROVIDER_SITE_OTHER): Payer: Medicare Other

## 2012-08-07 DIAGNOSIS — Z1289 Encounter for screening for malignant neoplasm of other sites: Secondary | ICD-10-CM

## 2012-08-07 DIAGNOSIS — Z0000 Encounter for general adult medical examination without abnormal findings: Secondary | ICD-10-CM

## 2012-08-07 LAB — HEMOCCULT GUIAC POC 1CARD (OFFICE): Fecal Occult Blood, POC: NEGATIVE

## 2012-11-15 ENCOUNTER — Ambulatory Visit (INDEPENDENT_AMBULATORY_CARE_PROVIDER_SITE_OTHER): Payer: Medicare Other | Admitting: Internal Medicine

## 2012-11-15 ENCOUNTER — Encounter: Payer: Self-pay | Admitting: Internal Medicine

## 2012-11-15 VITALS — BP 111/83 | HR 60 | Ht 75.0 in | Wt 170.0 lb

## 2012-11-15 DIAGNOSIS — R0989 Other specified symptoms and signs involving the circulatory and respiratory systems: Secondary | ICD-10-CM

## 2012-11-15 DIAGNOSIS — I4891 Unspecified atrial fibrillation: Secondary | ICD-10-CM

## 2012-11-15 DIAGNOSIS — R0600 Dyspnea, unspecified: Secondary | ICD-10-CM

## 2012-11-15 NOTE — Patient Instructions (Addendum)
Your physician has requested that you have an echocardiogram. Echocardiography is a painless test that uses sound waves to create images of your heart. It provides your doctor with information about the size and shape of your heart and how well your heart's chambers and valves are working. This procedure takes approximately one hour. There are no restrictions for this procedure.  Your physician wants you to follow-up in: 1 year with Dr. Ladona Ridgel.  You will receive a reminder letter in the mail two months in advance. If you don't receive a letter, please call our office to schedule the follow-up appointment.

## 2012-11-16 ENCOUNTER — Encounter: Payer: Self-pay | Admitting: Internal Medicine

## 2012-11-16 DIAGNOSIS — R0600 Dyspnea, unspecified: Secondary | ICD-10-CM | POA: Insufficient documentation

## 2012-11-16 NOTE — Assessment & Plan Note (Signed)
His ventricular rate appears to be well controlled. No change in medication at this time.
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
## 2012-11-16 NOTE — Assessment & Plan Note (Signed)
Etiology is unclear, never a smoker. Will obtain a 2D echo to look at his LV function.

## 2012-11-20 ENCOUNTER — Ambulatory Visit (HOSPITAL_COMMUNITY): Payer: Medicare Other | Attending: Cardiology | Admitting: Radiology

## 2012-11-20 DIAGNOSIS — E785 Hyperlipidemia, unspecified: Secondary | ICD-10-CM | POA: Insufficient documentation

## 2012-11-20 DIAGNOSIS — R0600 Dyspnea, unspecified: Secondary | ICD-10-CM

## 2012-11-20 DIAGNOSIS — I079 Rheumatic tricuspid valve disease, unspecified: Secondary | ICD-10-CM | POA: Insufficient documentation

## 2012-11-20 DIAGNOSIS — I059 Rheumatic mitral valve disease, unspecified: Secondary | ICD-10-CM | POA: Insufficient documentation

## 2012-11-20 DIAGNOSIS — I4891 Unspecified atrial fibrillation: Secondary | ICD-10-CM

## 2012-11-20 DIAGNOSIS — Z8249 Family history of ischemic heart disease and other diseases of the circulatory system: Secondary | ICD-10-CM | POA: Insufficient documentation

## 2012-11-20 NOTE — Progress Notes (Signed)
Echocardiogram performed.  

## 2012-11-24 ENCOUNTER — Encounter: Payer: Self-pay | Admitting: Internal Medicine

## 2012-12-27 ENCOUNTER — Encounter: Payer: Self-pay | Admitting: Internal Medicine

## 2012-12-27 ENCOUNTER — Ambulatory Visit (INDEPENDENT_AMBULATORY_CARE_PROVIDER_SITE_OTHER): Payer: Medicare Other | Admitting: Internal Medicine

## 2012-12-27 VITALS — BP 122/70 | HR 83 | Temp 97.7°F | Wt 167.0 lb

## 2012-12-27 DIAGNOSIS — J029 Acute pharyngitis, unspecified: Secondary | ICD-10-CM

## 2012-12-27 DIAGNOSIS — J209 Acute bronchitis, unspecified: Secondary | ICD-10-CM

## 2012-12-27 MED ORDER — AZITHROMYCIN 250 MG PO TABS: ORAL_TABLET | ORAL | Status: DC

## 2012-12-27 MED ORDER — HYDROCODONE-HOMATROPINE 5-1.5 MG/5ML PO SYRP: 5.0000 mL | ORAL_SOLUTION | Freq: Four times a day (QID) | ORAL | Status: DC | PRN

## 2012-12-27 NOTE — Patient Instructions (Addendum)
Zicam Melts or Zinc lozenges as per package label for scratchy throat . Complementary options include  vitamin C 2000 mg daily; & Echinacea for 4-7 days. Report persistent or progressive fever; discolored nasal or chest secretions; or frontal headache or facial  pain.     Isometric exercises prior to going to bed as discussed. Cal/Mag ( calcium/ magnesium) at bedtime as needed for leg discomfort.

## 2012-12-27 NOTE — Progress Notes (Signed)
  Subjective:    Patient ID: Troy Willis, male    DOB: 09/29/1945, 67 y.o.   MRN: [id number]  HPI   Symptoms began 01/25/13 is a scratchy throat associated with congestion of the posterior pharynx and  postnasal drainage  As of 9/13 he developed a nonproductive cough. He also had chills and possible fever or with associated arthralgias  He's also had some left retro-orbital discomfort  Also as of that date he had some scant clear sputum with some brown discoloration.  He quit smoking totally 1980; he states that smoking wasn't not significant.  He has no history of asthma    Review of Systems  He denies extrinsic symptoms of itchy or watery eyes.Sneezing was limited.  He has had no blurred vision, double vision, or loss of vision  He also denies frontal headache, facial pain, nasal purulence, dental pain, otic pain, or otic discharge  There has been no associated dyspnea or wheezing with the cough but he states he is too tired to be active.     Objective:   Physical Exam General appearance:good health ;well nourished; no acute distress or increased work of breathing is present.  No  lymphadenopathy about the head, neck, or axilla noted.   Eyes: No conjunctival inflammation or lid edema is present. Extraocular motion is intact. Vision is normal with lenses  Ears:  External ear exam shows no significant lesions or deformities.  Otoscopic examination reveals clear canals, tympanic membranes are intact bilaterally without bulging, retraction, inflammation or discharge.  Nose:  External nasal examination shows no deformity or inflammation. Nasal mucosa are pink and moist without lesions or exudates. No septal dislocation or deviation.No obstruction to airflow.   Oral exam: Dental hygiene is good; lips and gums are healthy appearing.There is asymmetric oropharyngeal erythema, right greater than left. No exudate present Neck:  No deformities,  masses, or tenderness noted.    Supple with full range of motion without pain.   Heart:  Normal rate and regular rhythm. S1 and S2 normal without gallop, murmur, click, rub or other extra sounds.   Lungs:Chest clear to auscultation; no wheezes, rhonchi,rales ,or rubs present.No increased work of breathing.  Nonproductive cough, paroxysmal.  Extremities:  No cyanosis, edema, or clubbing  noted    Skin: Warm & dry .         Assessment & Plan:  #1 pharyngitis  #2 acute bronchitis  Plan: See orders and recommendations

## 2013-01-06 ENCOUNTER — Encounter: Payer: Self-pay | Admitting: Internal Medicine

## 2013-03-19 ENCOUNTER — Ambulatory Visit: Payer: Medicare Other

## 2013-03-21 ENCOUNTER — Other Ambulatory Visit: Payer: Self-pay

## 2013-08-05 ENCOUNTER — Ambulatory Visit: Payer: Medicare Other | Admitting: Family Medicine

## 2014-02-20 ENCOUNTER — Ambulatory Visit: Payer: Medicare Other | Admitting: Medical

## 2014-02-21 ENCOUNTER — Ambulatory Visit: Payer: Medicare Other | Admitting: Medical

## 2014-02-21 ENCOUNTER — Ambulatory Visit: Payer: Medicare Other | Admitting: Family Medicine

## 2014-03-19 ENCOUNTER — Ambulatory Visit (INDEPENDENT_AMBULATORY_CARE_PROVIDER_SITE_OTHER): Payer: Medicare Other

## 2014-03-19 DIAGNOSIS — Z23 Encounter for immunization: Secondary | ICD-10-CM

## 2014-04-08 ENCOUNTER — Encounter: Payer: Self-pay | Admitting: Internal Medicine

## 2014-04-08 ENCOUNTER — Ambulatory Visit (INDEPENDENT_AMBULATORY_CARE_PROVIDER_SITE_OTHER): Payer: Medicare Other | Admitting: Internal Medicine

## 2014-04-08 VITALS — BP 124/80 | HR 64 | Ht 75.0 in | Wt 174.8 lb

## 2014-04-08 DIAGNOSIS — E782 Mixed hyperlipidemia: Secondary | ICD-10-CM

## 2014-04-08 DIAGNOSIS — R252 Cramp and spasm: Secondary | ICD-10-CM

## 2014-04-08 DIAGNOSIS — I4891 Unspecified atrial fibrillation: Secondary | ICD-10-CM

## 2014-04-08 NOTE — Patient Instructions (Signed)
Your physician wants you to follow-up in: 24 months with Dr. Knox Saliva will receive a reminder letter in the mail two months in advance. If you don't receive a letter, please call our office to schedule the follow-up appointment.

## 2014-04-08 NOTE — Assessment & Plan Note (Signed)
His ventricular rate is well controlled, and he is essentially asymptomatic. He is low risk for stroke. At age 68, we will revisit systemic anticoagulation. He currently does not have hypertension.

## 2014-04-08 NOTE — Assessment & Plan Note (Signed)
He will continue on his fish oil supplement. A low-fat diet is recommended.

## 2014-04-08 NOTE — Assessment & Plan Note (Signed)
His leg cramps are fairly well controlled. I've encouraged the patient to drink orange juice, or eat mustard if his cramps become worse.
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
## 2014-05-14 ENCOUNTER — Ambulatory Visit (INDEPENDENT_AMBULATORY_CARE_PROVIDER_SITE_OTHER): Payer: Medicare Other | Admitting: Family Medicine

## 2014-05-14 ENCOUNTER — Encounter: Payer: Self-pay | Admitting: Family Medicine

## 2014-05-14 VITALS — BP 120/64 | HR 73 | Temp 97.7°F | Resp 16 | Ht 75.0 in | Wt 175.2 lb

## 2014-05-14 DIAGNOSIS — E782 Mixed hyperlipidemia: Secondary | ICD-10-CM

## 2014-05-14 DIAGNOSIS — I482 Chronic atrial fibrillation, unspecified: Secondary | ICD-10-CM

## 2014-05-14 DIAGNOSIS — Z23 Encounter for immunization: Secondary | ICD-10-CM

## 2014-05-14 NOTE — Patient Instructions (Signed)
Schedule your complete physical in 6 months We'll notify you of your lab results and make any changes if needed Keep up the good work on healthy diet and regular exercise- you look great!!! Call with any questions or concerns Happy New Year!! 

## 2014-05-14 NOTE — Progress Notes (Signed)
   Subjective:    Patient ID: Troy Willis, male    DOB: 02/10/1946, 68 y.o.   MRN: [id number]  HPI New to establish.  Previous MD- Linna Darner.  Cards- Lovena Le  Hyperlipidemia- pt has hx of this.  Was able to control w/ healthy diet and regular exercise.  Has not had recent labs.  Admits to some laxity on exercise.  Has not required meds in the past.  Taking Fish Oil OTC.  Afib- chronic problem, seeing Dr Lovena Le.  At November visit, was told he was doing 'so well that I don't need to come back for 2 yrs'.  Pt has had cardioversion x2 but 'it didn't take'.  Rate controlled but remains irregular.  Denies CP, SOB, palpitations, edema.   Review of Systems For ROS see HPI     Objective:   Physical Exam  Constitutional: He is oriented to person, place, and time. He appears well-developed and well-nourished. No distress.  HENT:  Head: Normocephalic and atraumatic.  Eyes: Conjunctivae and EOM are normal. Pupils are equal, round, and reactive to light.  Neck: Normal range of motion. Neck supple. No thyromegaly present.  Cardiovascular: Normal rate, normal heart sounds and intact distal pulses.   No murmur heard. Irregularly irregular  Pulmonary/Chest: Effort normal and breath sounds normal. No respiratory distress.  Abdominal: Soft. Bowel sounds are normal. He exhibits no distension.  Musculoskeletal: He exhibits no edema.  Lymphadenopathy:    He has no cervical adenopathy.  Neurological: He is alert and oriented to person, place, and time. No cranial nerve deficit.  Skin: Skin is warm and dry.  Psychiatric: He has a normal mood and affect. His behavior is normal.  Vitals reviewed.         Assessment & Plan:

## 2014-05-14 NOTE — Progress Notes (Signed)
Pre visit review using our clinic review tool, if applicable. No additional management support is needed unless otherwise documented below in the visit note. 

## 2014-05-15 LAB — CBC WITH DIFFERENTIAL/PLATELET
BASOS ABS: 0 10*3/uL (ref 0.0–0.1)
Basophils Relative: 0.7 % (ref 0.0–3.0)
EOS PCT: 1.3 % (ref 0.0–5.0)
Eosinophils Absolute: 0.1 10*3/uL (ref 0.0–0.7)
HCT: 38.3 % — ABNORMAL LOW (ref 39.0–52.0)
HEMOGLOBIN: 12.6 g/dL — AB (ref 13.0–17.0)
LYMPHS ABS: 2.1 10*3/uL (ref 0.7–4.0)
Lymphocytes Relative: 31.7 % (ref 12.0–46.0)
MCHC: 33 g/dL (ref 30.0–36.0)
MCV: 102.7 fl — ABNORMAL HIGH (ref 78.0–100.0)
MONO ABS: 0.4 10*3/uL (ref 0.1–1.0)
Monocytes Relative: 6.4 % (ref 3.0–12.0)
Neutro Abs: 3.9 10*3/uL (ref 1.4–7.7)
Neutrophils Relative %: 59.9 % (ref 43.0–77.0)
Platelets: 218 10*3/uL (ref 150.0–400.0)
RBC: 3.73 Mil/uL — AB (ref 4.22–5.81)
RDW: 14.1 % (ref 11.5–15.5)
WBC: 6.6 10*3/uL (ref 4.0–10.5)

## 2014-05-15 LAB — BASIC METABOLIC PANEL
BUN: 17 mg/dL (ref 6–23)
CO2: 28 mEq/L (ref 19–32)
Calcium: 9.2 mg/dL (ref 8.4–10.5)
Chloride: 104 mEq/L (ref 96–112)
Creatinine, Ser: 1.1 mg/dL (ref 0.4–1.5)
GFR: 71.94 mL/min (ref >60.00)
GLUCOSE: 82 mg/dL (ref 70–99)
POTASSIUM: 4.1 meq/L (ref 3.5–5.1)
SODIUM: 136 meq/L (ref 135–145)

## 2014-05-15 LAB — HEPATIC FUNCTION PANEL
ALBUMIN: 4 g/dL (ref 3.5–5.2)
ALK PHOS: 61 U/L (ref 39–117)
ALT: 27 U/L (ref 0–53)
AST: 31 U/L (ref 0–37)
Bilirubin, Direct: 0.1 mg/dL (ref 0.0–0.3)
Total Bilirubin: 0.6 mg/dL (ref 0.2–1.2)
Total Protein: 7.4 g/dL (ref 6.0–8.3)

## 2014-05-15 LAB — LIPID PANEL
CHOL/HDL RATIO: 3
Cholesterol: 194 mg/dL (ref 0–200)
HDL: 59.9 mg/dL (ref >39.00)
LDL CALC: 123 mg/dL — AB (ref 0–99)
NONHDL: 134.1
Triglycerides: 57 mg/dL (ref 0.0–149.0)
VLDL: 11.4 mg/dL (ref 0.0–40.0)

## 2014-05-15 LAB — TSH: TSH: 3.23 u[IU]/mL (ref 0.35–4.50)

## 2014-05-15 NOTE — Assessment & Plan Note (Signed)
Chronic problem.  Not on anticoagulation or rate control medications.  Following w/ Cards.  Asymptomatic.  Check labs.  Will follow.

## 2014-05-15 NOTE — Assessment & Plan Note (Signed)
Chronic problem.  Pt reports he has been able to control w/ healthy diet and regular exercise.  Has not had recent labs.  Get labs.  Start meds prn.  Pt expressed understanding and is in agreement w/ plan.

## 2015-01-12 ENCOUNTER — Encounter: Payer: Self-pay | Admitting: General Practice

## 2015-01-20 ENCOUNTER — Telehealth: Payer: Self-pay | Admitting: Family Medicine

## 2015-01-20 NOTE — Telephone Encounter (Signed)
Can you see if pt will schedule with you and then [name] will order labs?

## 2015-01-20 NOTE — Telephone Encounter (Signed)
Yes- pt was due for CPE and was to schedule when he left in December.  Since he did not, he can be placed on the wait list or have a wellness visit w/ Caryl Pina

## 2015-01-20 NOTE — Telephone Encounter (Signed)
°  Relation to LL:VDIX Call back number:[phone] Pharmacy:  Reason for call: pt is requesting an order labs for a PSA , states his last one was 2013.

## 2015-01-20 NOTE — Telephone Encounter (Signed)
Please advise pt just established with you 04/2014 and did not schedule a CPE. Want me to have ashlee schedule him a medicare wellness with her?  Please advise on the PSA

## 2015-01-20 NOTE — Telephone Encounter (Signed)
Medicare Wellness visit scheduled on 01/22/15.  Pt plans to come in fasting. Which labs would you like drawn?

## 2015-01-21 NOTE — Telephone Encounter (Signed)
CBC w/ diff, TSH- dx afib BMP, lipid panel, LFTs- Hyperlipidemia PSA- prostate cancer screen (please let him know that depending on insurance this may or may not be covered)  If he has urgency, hesitancy, nocturia these symptoms should get a PSA paid for

## 2015-01-22 ENCOUNTER — Ambulatory Visit (INDEPENDENT_AMBULATORY_CARE_PROVIDER_SITE_OTHER): Payer: Medicare Other

## 2015-01-22 VITALS — BP 102/80 | HR 75 | Temp 97.9°F | Ht 75.0 in | Wt 168.2 lb

## 2015-01-22 DIAGNOSIS — I4891 Unspecified atrial fibrillation: Secondary | ICD-10-CM | POA: Diagnosis not present

## 2015-01-22 DIAGNOSIS — R351 Nocturia: Secondary | ICD-10-CM

## 2015-01-22 DIAGNOSIS — Z1159 Encounter for screening for other viral diseases: Secondary | ICD-10-CM

## 2015-01-22 DIAGNOSIS — E785 Hyperlipidemia, unspecified: Secondary | ICD-10-CM | POA: Diagnosis not present

## 2015-01-22 DIAGNOSIS — Z23 Encounter for immunization: Secondary | ICD-10-CM | POA: Diagnosis not present

## 2015-01-22 DIAGNOSIS — Z0000 Encounter for general adult medical examination without abnormal findings: Secondary | ICD-10-CM

## 2015-01-22 LAB — LIPID PANEL
CHOLESTEROL: 188 mg/dL (ref 0–200)
HDL: 61.6 mg/dL (ref >39.00)
LDL Cholesterol: 114 mg/dL — ABNORMAL HIGH (ref 0–99)
NONHDL: 126.43
Total CHOL/HDL Ratio: 3
Triglycerides: 62 mg/dL (ref 0.0–149.0)
VLDL: 12.4 mg/dL (ref 0.0–40.0)

## 2015-01-22 LAB — CBC WITH DIFFERENTIAL/PLATELET
BASOS ABS: 0 10*3/uL (ref 0.0–0.1)
Basophils Relative: 0.6 % (ref 0.0–3.0)
EOS ABS: 0.1 10*3/uL (ref 0.0–0.7)
Eosinophils Relative: 1.4 % (ref 0.0–5.0)
HEMATOCRIT: 42.3 % (ref 39.0–52.0)
HEMOGLOBIN: 14 g/dL (ref 13.0–17.0)
LYMPHS PCT: 34.1 % (ref 12.0–46.0)
Lymphs Abs: 1.9 10*3/uL (ref 0.7–4.0)
MCHC: 33.2 g/dL (ref 30.0–36.0)
MCV: 103.3 fl — ABNORMAL HIGH (ref 78.0–100.0)
Monocytes Absolute: 0.5 10*3/uL (ref 0.1–1.0)
Monocytes Relative: 9.8 % (ref 3.0–12.0)
NEUTROS ABS: 3 10*3/uL (ref 1.4–7.7)
Neutrophils Relative %: 54.1 % (ref 43.0–77.0)
PLATELETS: 208 10*3/uL (ref 150.0–400.0)
RBC: 4.1 Mil/uL — ABNORMAL LOW (ref 4.22–5.81)
RDW: 15.1 % (ref 11.5–15.5)
WBC: 5.6 10*3/uL (ref 4.0–10.5)

## 2015-01-22 LAB — BASIC METABOLIC PANEL
BUN: 14 mg/dL (ref 6–23)
CO2: 29 mEq/L (ref 19–32)
Calcium: 9.8 mg/dL (ref 8.4–10.5)
Chloride: 101 mEq/L (ref 96–112)
Creatinine, Ser: 0.9 mg/dL (ref 0.40–1.50)
GFR: 88.61 mL/min (ref >60.00)
GLUCOSE: 92 mg/dL (ref 70–99)
POTASSIUM: 4.4 meq/L (ref 3.5–5.1)
SODIUM: 138 meq/L (ref 135–145)

## 2015-01-22 LAB — HEPATIC FUNCTION PANEL
ALBUMIN: 4.4 g/dL (ref 3.5–5.2)
ALT: 20 U/L (ref 0–53)
AST: 29 U/L (ref 0–37)
Alkaline Phosphatase: 69 U/L (ref 39–117)
Bilirubin, Direct: 0.1 mg/dL (ref 0.0–0.3)
TOTAL PROTEIN: 7.9 g/dL (ref 6.0–8.3)
Total Bilirubin: 0.4 mg/dL (ref 0.2–1.2)

## 2015-01-22 LAB — HEPATITIS C ANTIBODY: HCV AB: NEGATIVE

## 2015-01-22 LAB — PSA, MEDICARE: PSA: 0.65 ng/mL (ref 0.10–4.00)

## 2015-01-22 LAB — TSH: TSH: 2.72 u[IU]/mL (ref 0.35–4.50)

## 2015-01-22 NOTE — Telephone Encounter (Signed)
Labs ordered.

## 2015-01-22 NOTE — Progress Notes (Signed)
Pre visit review using our clinic review tool, if applicable. No additional management support is needed unless otherwise documented below in the visit note. 
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
## 2015-01-22 NOTE — Patient Instructions (Addendum)
Schedule colonoscopy.  Bring in copy of Advanced Directive during next visit.  Follow up with Dr. [name] as needed.     Health Maintenance A healthy lifestyle and preventative care can promote health and wellness.  Maintain regular health, dental, and eye exams.  Eat a healthy diet. Foods like vegetables, fruits, whole grains, low-fat dairy products, and lean protein foods contain the nutrients you need and are low in calories. Decrease your intake of foods high in solid fats, added sugars, and salt. Get information about a proper diet from your health care provider, if necessary.  Regular physical exercise is one of the most important things you can do for your health. Most adults should get at least 150 minutes of moderate-intensity exercise (any activity that increases your heart rate and causes you to sweat) each week. In addition, most adults need muscle-strengthening exercises on 2 or more days a week.   Maintain a healthy weight. The body mass index (BMI) is a screening tool to identify possible weight problems. It provides an estimate of body fat based on height and weight. Your health care provider can find your BMI and can help you achieve or maintain a healthy weight. For males 20 years and older:  A BMI below 18.5 is considered underweight.  A BMI of 18.5 to 24.9 is normal.  A BMI of 25 to 29.9 is considered overweight.  A BMI of 30 and above is considered obese.  Maintain normal blood lipids and cholesterol by exercising and minimizing your intake of saturated fat. Eat a balanced diet with plenty of fruits and vegetables. Blood tests for lipids and cholesterol should begin at age 46 and be repeated every 5 years. If your lipid or cholesterol levels are high, you are over age 33, or you are at high risk for heart disease, you may need your cholesterol levels checked more frequently.Ongoing high lipid and cholesterol levels should be treated with medicines if diet and exercise  are not working.  If you smoke, find out from your health care provider how to quit. If you do not use tobacco, do not start.  Lung cancer screening is recommended for adults aged 77-80 years who are at high risk for developing lung cancer because of a history of smoking. A yearly low-dose CT scan of the lungs is recommended for people who have at least a 30-pack-year history of smoking and are current smokers or have quit within the past 15 years. A pack year of smoking is smoking an average of 1 pack of cigarettes a day for 1 year (for example, a 30-pack-year history of smoking could mean smoking 1 pack a day for 30 years or 2 packs a day for 15 years). Yearly screening should continue until the smoker has stopped smoking for at least 15 years. Yearly screening should be stopped for people who develop a health problem that would prevent them from having lung cancer treatment.  If you choose to drink alcohol, do not have more than 2 drinks per day. One drink is considered to be 12 oz (360 mL) of beer, 5 oz (150 mL) of wine, or 1.5 oz (45 mL) of liquor.  Avoid the use of street drugs. Do not share needles with anyone. Ask for help if you need support or instructions about stopping the use of drugs.  High blood pressure causes heart disease and increases the risk of stroke. Blood pressure should be checked at least every 1-2 years. Ongoing high blood pressure should be  treated with medicines if weight loss and exercise are not effective.  If you are 90-50 years old, ask your health care provider if you should take aspirin to prevent heart disease.  Diabetes screening involves taking a blood sample to check your fasting blood sugar level. This should be done once every 3 years after age 64 if you are at a normal weight and without risk factors for diabetes. Testing should be considered at a younger age or be carried out more frequently if you are overweight and have at least 1 risk factor for  diabetes.  Colorectal cancer can be detected and often prevented. Most routine colorectal cancer screening begins at the age of 56 and continues through age 55. However, your health care provider may recommend screening at an earlier age if you have risk factors for colon cancer. On a yearly basis, your health care provider may provide home test kits to check for hidden blood in the stool. A small camera at the end of a tube may be used to directly examine the colon (sigmoidoscopy or colonoscopy) to detect the earliest forms of colorectal cancer. Talk to your health care provider about this at age 52 when routine screening begins. A direct exam of the colon should be repeated every 5-10 years through age 75, unless early forms of precancerous polyps or small growths are found.  People who are at an increased risk for hepatitis B should be screened for this virus. You are considered at high risk for hepatitis B if:  You were born in a country where hepatitis B occurs often. Talk with your health care provider about which countries are considered high risk.  Your parents were born in a high-risk country and you have not received a shot to protect against hepatitis B (hepatitis B vaccine).  You have HIV or AIDS.  You use needles to inject street drugs.  You live with, or have sex with, someone who has hepatitis B.  You are a man who has sex with other men (MSM).  You get hemodialysis treatment.  You take certain medicines for conditions like cancer, organ transplantation, and autoimmune conditions.  Hepatitis C blood testing is recommended for all people born from [birth year] through 1966 and any individual with known risk factors for hepatitis C.  Healthy men should no longer receive prostate-specific antigen (PSA) blood tests as part of routine cancer screening. Talk to your health care provider about prostate cancer screening.  Testicular cancer screening is not recommended for adolescents or  adult males who have no symptoms. Screening includes self-exam, a health care provider exam, and other screening tests. Consult with your health care provider about any symptoms you have or any concerns you have about testicular cancer.  Practice safe sex. Use condoms and avoid high-risk sexual practices to reduce the spread of sexually transmitted infections (STIs).  You should be screened for STIs, including gonorrhea and chlamydia if:  You are sexually active and are younger than 24 years.  You are older than 24 years, and your health care provider tells you that you are at risk for this type of infection.  Your sexual activity has changed since you were last screened, and you are at an increased risk for chlamydia or gonorrhea. Ask your health care provider if you are at risk.  If you are at risk of being infected with HIV, it is recommended that you take a prescription medicine daily to prevent HIV infection. This is called pre-exposure prophylaxis (PrEP). You  are considered at risk if:  You are a man who has sex with other men (MSM).  You are a heterosexual man who is sexually active with multiple partners.  You take drugs by injection.  You are sexually active with a partner who has HIV.  Talk with your health care provider about whether you are at high risk of being infected with HIV. If you choose to begin PrEP, you should first be tested for HIV. You should then be tested every 3 months for as long as you are taking PrEP.  Use sunscreen. Apply sunscreen liberally and repeatedly throughout the day. You should seek shade when your shadow is shorter than you. Protect yourself by wearing long sleeves, pants, a wide-brimmed hat, and sunglasses year round whenever you are outdoors.  Tell your health care provider of new moles or changes in moles, especially if there is a change in shape or color. Also, tell your health care provider if a mole is larger than the size of a pencil  eraser.  A one-time screening for abdominal aortic aneurysm (AAA) and surgical repair of large AAAs by ultrasound is recommended for men aged 68-75 years who are current or former smokers.  Stay current with your vaccines (immunizations). Document Released: 10/29/2007 Document Revised: 05/07/2013 Document Reviewed: 09/27/2010 [facility] Patient Information 2015 [place], [place]. This information is not intended to replace advice given to you by your health care provider. Make sure you discuss any questions you have with your health care provider.

## 2015-01-22 NOTE — Progress Notes (Signed)
   Subjective:    Patient ID: Troy Willis, male    DOB: July 11, 1945, 69 y.o.   MRN: [id number]  HPI Agree w/ documentation provided by RN.   Review of Systems     Objective:   Physical Exam To be completed at time of CPE       Assessment & Plan:  F/u w/ me for CPE

## 2015-04-27 ENCOUNTER — Encounter: Payer: Self-pay | Admitting: Internal Medicine

## 2015-05-19 ENCOUNTER — Telehealth: Payer: Self-pay | Admitting: Family Medicine

## 2015-05-19 NOTE — Telephone Encounter (Signed)
Pt request to transfer from Dr. [name] to Dr. Quay Burow? Please advise, pt has an appt with Dr. Quay Burow for an acute visit (cough) on Friday 05/22/15 ( 30 min appt if Dr. Quay Burow wants to go ahead a do the transfer at this appt) if this is okey?

## 2015-05-19 NOTE — Telephone Encounter (Signed)
Ok to transfer - ok to change to 30 min appt if available

## 2015-05-19 NOTE — Telephone Encounter (Signed)
Ok to transfer. 

## 2015-05-20 NOTE — Telephone Encounter (Signed)
Pt aware.

## 2015-05-22 ENCOUNTER — Ambulatory Visit (INDEPENDENT_AMBULATORY_CARE_PROVIDER_SITE_OTHER): Payer: Medicare Other | Admitting: Internal Medicine

## 2015-05-22 ENCOUNTER — Encounter: Payer: Self-pay | Admitting: Internal Medicine

## 2015-05-22 VITALS — BP 118/74 | HR 63 | Temp 97.4°F | Resp 16 | Wt 171.0 lb

## 2015-05-22 DIAGNOSIS — R05 Cough: Secondary | ICD-10-CM | POA: Diagnosis not present

## 2015-05-22 DIAGNOSIS — R059 Cough, unspecified: Secondary | ICD-10-CM
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
## 2015-05-22 NOTE — Patient Instructions (Signed)
Continue monitoring your cough and if it does not improve please call.

## 2015-05-22 NOTE — Progress Notes (Signed)
Pre visit review using our clinic review tool, if applicable. No additional management support is needed unless otherwise documented below in the visit note. 

## 2015-06-05 ENCOUNTER — Encounter: Payer: Self-pay | Admitting: Internal Medicine

## 2015-07-17 ENCOUNTER — Encounter: Payer: Self-pay | Admitting: Family Medicine

## 2015-07-17 ENCOUNTER — Ambulatory Visit (INDEPENDENT_AMBULATORY_CARE_PROVIDER_SITE_OTHER): Payer: Medicare Other | Admitting: Family Medicine

## 2015-07-17 VITALS — BP 120/76 | HR 79 | Temp 98.4°F | Resp 16 | Ht 75.0 in | Wt 168.4 lb

## 2015-07-17 DIAGNOSIS — J209 Acute bronchitis, unspecified: Secondary | ICD-10-CM | POA: Diagnosis not present

## 2015-07-17 MED ORDER — GUAIFENESIN-CODEINE 100-10 MG/5ML PO SYRP: 10.0000 mL | ORAL_SOLUTION | Freq: Three times a day (TID) | ORAL | Status: DC | PRN

## 2015-07-17 MED ORDER — BENZONATATE 200 MG PO CAPS: 200.0000 mg | ORAL_CAPSULE | Freq: Three times a day (TID) | ORAL | Status: DC | PRN

## 2015-07-17 MED ORDER — ALBUTEROL SULFATE (2.5 MG/3ML) 0.083% IN NEBU
2.5000 mg | INHALATION_SOLUTION | Freq: Once | RESPIRATORY_TRACT | Status: AC
  Administered 2015-07-17: 2.5 mg via RESPIRATORY_TRACT

## 2015-07-17 MED ORDER — ALBUTEROL SULFATE HFA 108 (90 BASE) MCG/ACT IN AERS: 2.0000 | INHALATION_SPRAY | RESPIRATORY_TRACT | Status: DC | PRN

## 2015-07-17 NOTE — Assessment & Plan Note (Signed)
New.  Pt is well appearing today and no evidence of bacterial infxn on PE.  He had decreased air movement throughout- this improved s/p neb tx.  No need for abx.  Will start cough meds and inhaler prn.  Reviewed supportive care and red flags that should prompt return.  Pt expressed understanding and is in agreement w/ plan.

## 2015-07-17 NOTE — Progress Notes (Signed)
Pre visit review using our clinic review tool, if applicable. No additional management support is needed unless otherwise documented below in the visit note. 

## 2015-07-17 NOTE — Patient Instructions (Signed)
Follow up as needed Start the Tessalon for daytime cough Cough syrup for nights/weekends- will cause drowsiness Use the inhaler- 2 puffs every 4 hrs as needed for cough, congestion, wheezing Drink plenty of fluids REST! Call with any questions or concerns If you want to join [place] at the new [place] office, any scheduled appointments will automatically transfer and we will see you at [address] [place] [address] [place], [place], [place] [address] ([place]) [place] in there!!!

## 2015-07-17 NOTE — Progress Notes (Signed)
   Subjective:    Patient ID: Troy Willis, male    DOB: May 09, 1946, 70 y.o.   MRN: [id number]  HPI URI- 2nd episode since Christmas.  sxs started 10 days ago.  Cough is productive of dark brown sputum- primarily in AM and evening.  Cough is triggered by deep breaths.  + nasal congestion.  'my head is in a bag'.  Sleeping on multiple pillows to improve breathing.  + fatigue from poor sleep.  + sick contacts.  Denies sinus pressure, N/V, wheezing, sneezing, eye sxs.  Denies fever.  Taking Mucinex DM twice daily w/ some improvement.  + hx of PNA.     Review of Systems For ROS see HPI     Objective:   Physical Exam  Constitutional: He appears well-developed and well-nourished. No distress.  HENT:  Head: Normocephalic and atraumatic.  Right Ear: Tympanic membrane normal.  Left Ear: Tympanic membrane normal.  Nose: No mucosal edema or rhinorrhea. Right sinus exhibits no maxillary sinus tenderness and no frontal sinus tenderness. Left sinus exhibits no maxillary sinus tenderness and no frontal sinus tenderness.  Mouth/Throat: Mucous membranes are normal. No oropharyngeal exudate, posterior oropharyngeal edema or posterior oropharyngeal erythema.  Eyes: Conjunctivae and EOM are normal. Pupils are equal, round, and reactive to light.  Neck: Normal range of motion. Neck supple.  Cardiovascular: Normal rate, regular rhythm and normal heart sounds.   Pulmonary/Chest: Effort normal and breath sounds normal. No respiratory distress. He has no wheezes.  + hacking cough  Lymphadenopathy:    He has no cervical adenopathy.  Skin: Skin is warm and dry.  Vitals reviewed.         Assessment & Plan:

## 2015-07-30 ENCOUNTER — Ambulatory Visit (AMBULATORY_SURGERY_CENTER): Payer: Self-pay | Admitting: *Deleted

## 2015-07-30 VITALS — Ht 75.0 in | Wt 167.0 lb

## 2015-07-30 DIAGNOSIS — Z1211 Encounter for screening for malignant neoplasm of colon: Secondary | ICD-10-CM

## 2015-07-30 NOTE — Progress Notes (Signed)
No egg or soy allergy. No anesthesia problems.  No home O2.  No diet meds.  No emmi given.  

## 2015-08-03 ENCOUNTER — Ambulatory Visit (INDEPENDENT_AMBULATORY_CARE_PROVIDER_SITE_OTHER): Payer: Medicare Other | Admitting: Family

## 2015-08-03 ENCOUNTER — Encounter: Payer: Self-pay | Admitting: Family

## 2015-08-03 VITALS — BP 120/78 | HR 79 | Temp 97.6°F | Resp 16 | Ht 75.0 in | Wt 169.0 lb

## 2015-08-03 DIAGNOSIS — H109 Unspecified conjunctivitis: Secondary | ICD-10-CM

## 2015-08-03 MED ORDER — CIPROFLOXACIN HCL 0.3 % OP SOLN: 1.0000 [drp] | OPHTHALMIC | Status: DC

## 2015-08-03 NOTE — Progress Notes (Signed)
Pre visit review using our clinic review tool, if applicable. No additional management support is needed unless otherwise documented below in the visit note. 

## 2015-08-03 NOTE — Patient Instructions (Signed)
Start ciloxan eye drops.  Bacterial Conjunctivitis Bacterial conjunctivitis, commonly called pink eye, is an inflammation of the clear membrane that covers the white part of the eye (conjunctiva). The inflammation can also happen on the underside of the eyelids. The blood vessels in the conjunctiva become inflamed, causing the eye to become red or pink. Bacterial conjunctivitis may spread easily from one eye to another and from person to person (contagious).  CAUSES  Bacterial conjunctivitis is caused by bacteria. The bacteria may come from your own skin, your upper respiratory tract, or from someone else with bacterial conjunctivitis. SYMPTOMS  The normally white color of the eye or the underside of the eyelid is usually pink or red. The pink eye is usually associated with irritation, tearing, and some sensitivity to light. Bacterial conjunctivitis is often associated with a thick, yellowish discharge from the eye. The discharge may turn into a crust on the eyelids overnight, which causes your eyelids to stick together. If a discharge is present, there may also be some blurred vision in the affected eye. DIAGNOSIS  Bacterial conjunctivitis is diagnosed by your caregiver through an eye exam and the symptoms that you report. Your caregiver looks for changes in the surface tissues of your eyes, which may point to the specific type of conjunctivitis. A sample of any discharge may be collected on a cotton-tip swab if you have a severe case of conjunctivitis, if your cornea is affected, or if you keep getting repeat infections that do not respond to treatment. The sample will be sent to a lab to see if the inflammation is caused by a bacterial infection and to see if the infection will respond to antibiotic medicines. TREATMENT   Bacterial conjunctivitis is treated with antibiotics. Antibiotic eyedrops are most often used. However, antibiotic ointments are also available. Antibiotics pills are sometimes used.  Artificial tears or eye washes may ease discomfort. HOME CARE INSTRUCTIONS   To ease discomfort, apply a cool, clean washcloth to your eye for 10-20 minutes, 3-4 times a day.  Gently wipe away any drainage from your eye with a warm, wet washcloth or a cotton ball.  Wash your hands often with soap and water. Use paper towels to dry your hands.  Do not share towels or washcloths. This may spread the infection.  Change or wash your pillowcase every day.  You should not use eye makeup until the infection is gone.  Do not operate machinery or drive if your vision is blurred.  Stop using contact lenses. Ask your caregiver how to sterilize or replace your contacts before using them again. This depends on the type of contact lenses that you use.  When applying medicine to the infected eye, do not touch the edge of your eyelid with the eyedrop bottle or ointment tube. SEEK IMMEDIATE MEDICAL CARE IF:   Your infection has not improved within 3 days after beginning treatment.  You had yellow discharge from your eye and it returns.  You have increased eye pain.  Your eye redness is spreading.  Your vision becomes blurred.  You have a fever or persistent symptoms for more than 2-3 days.  You have a fever and your symptoms suddenly get worse.  You have facial pain, redness, or swelling. MAKE SURE YOU:   Understand these instructions.  Will watch your condition.  Will get help right away if you are not doing well or get worse.   This information is not intended to replace advice given to you by your health  care provider. Make sure you discuss any questions you have with your health care provider.   Document Released: 05/02/2005 Document Revised: 05/23/2014 Document Reviewed: 10/03/2011 Elsevier Interactive Patient Education [employer].
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
## 2015-08-13 ENCOUNTER — Encounter: Payer: Self-pay | Admitting: Internal Medicine

## 2015-08-13 ENCOUNTER — Ambulatory Visit (AMBULATORY_SURGERY_CENTER): Payer: Medicare Other | Admitting: Internal Medicine

## 2015-08-13 VITALS — BP 108/75 | HR 76 | Temp 98.0°F | Resp 16 | Ht 75.0 in | Wt 167.0 lb

## 2015-08-13 DIAGNOSIS — D120 Benign neoplasm of cecum: Secondary | ICD-10-CM

## 2015-08-13 DIAGNOSIS — Z860101 Personal history of adenomatous and serrated colon polyps: Secondary | ICD-10-CM

## 2015-08-13 DIAGNOSIS — Z1211 Encounter for screening for malignant neoplasm of colon: Secondary | ICD-10-CM | POA: Diagnosis not present

## 2015-08-13 DIAGNOSIS — D123 Benign neoplasm of transverse colon: Secondary | ICD-10-CM | POA: Diagnosis not present

## 2015-08-13 DIAGNOSIS — Z86010 Personal history of colonic polyps: Secondary | ICD-10-CM

## 2015-08-13 HISTORY — DX: Personal history of colonic polyps: Z86.010

## 2015-08-13 HISTORY — DX: Personal history of adenomatous and serrated colon polyps: Z86.0101

## 2015-08-13 MED ORDER — SODIUM CHLORIDE 0.9 % IV SOLN: 500.0000 mL | INTRAVENOUS | Status: DC

## 2015-08-13 NOTE — Progress Notes (Signed)
A and Ox 3 Report to RN 

## 2015-08-13 NOTE — Op Note (Signed)
[facility] Patient Name: Troy Willis Procedure Date: 08/13/2015 2:17 PM MRN: [id number] Endoscopist: Gatha Mayer , MD Age: 70 Referring MD:  Date of Birth: 09-28-1945 Gender: Male Procedure:                Colonoscopy Indications:              Screening for colorectal malignant neoplasm Medicines:                Monitored Anesthesia Care, Propofol total dose 300                            mg IV Procedure:                Pre-Anesthesia Assessment:                           - Prior to the procedure, a History and Physical                            was performed, and patient medications and                            allergies were reviewed. The patient's tolerance of                            previous anesthesia was also reviewed. The risks                            and benefits of the procedure and the sedation                            options and risks were discussed with the patient.                            All questions were answered, and informed consent                            was obtained. Prior Anticoagulants: The patient has                            taken no previous anticoagulant or antiplatelet                            agents. ASA Grade Assessment: II - A patient with                            mild systemic disease. After reviewing the risks                            and benefits, the patient was deemed in                            satisfactory condition to undergo the procedure.  After obtaining informed consent, the colonoscope                            was passed under direct vision. Throughout the                            procedure, the patient's blood pressure, pulse, and                            oxygen saturations were monitored continuously. The                            Model CF-HQ190L [id number]) scope was introduced                            through the anus and advanced to the the cecum,                     identified by appendiceal orifice and ileocecal                            valve. The colonoscopy was performed without                            difficulty. The patient tolerated the procedure                            well. The quality of the bowel preparation was                            good. The bowel preparation used was Miralax. The                            ileocecal valve, appendiceal orifice, and rectum                            were photographed. Scope In: 2:27:59 PM Scope Out: 2:47:20 PM Scope Withdrawal Time: 0 hours 14 minutes 54 seconds  Total Procedure Duration: 0 hours 19 minutes 21 seconds  Findings:      The perianal and digital rectal examinations were normal. Pertinent       negatives include normal prostate (size, shape, and consistency).      Two sessile polyps were found in the transverse colon and cecum. The       polyps were 8 to 12 mm in size. These polyps were removed with a cold       snare. Resection and retrieval were complete. Verification of patient       identification for the specimen was done. Estimated blood loss: none.      Diverticula were found in the sigmoid colon.      The exam was otherwise without abnormality on direct and retroflexion       views. Complications:            No immediate complications. Estimated Blood Loss:     Estimated blood loss: none. Impression:               -  Two 8 to 12 mm polyps in the transverse colon and                            in the cecum, removed with a cold snare. Resected                            and retrieved.                           - Mild diverticulosis in the sigmoid colon.                           - The examination was otherwise normal on direct                            and retroflexion views. Recommendation:           - Patient has a contact number available for                            emergencies. The signs and symptoms of potential                            delayed  complications were discussed with the                            patient. Return to normal activities tomorrow.                            Written discharge instructions were provided to the                            patient.                           - Resume previous diet.                           - Continue present medications.                           - Repeat colonoscopy is recommended for                            surveillance. The colonoscopy date will be                            determined after pathology results from today's                            exam become available for review. Procedure Code(s):        --- Professional ---                           [id number], Colonoscopy, flexible; with removal of  tumor(s), polyp(s), or other lesion(s) by snare                            technique CPT copyright 2016 American Medical Association. All rights reserved. Gatha Mayer, MD 08/13/2015 2:58:20 PM This report has been signed electronically. Number of Addenda: 0 CC Letter to:             Binnie Rail Referring MD:      Binnie Rail

## 2015-08-13 NOTE — Progress Notes (Signed)
Called to room to assist during endoscopic procedure.  Patient ID and intended procedure confirmed with present staff. Received instructions for my participation in the procedure from the performing physician.  

## 2015-08-13 NOTE — Patient Instructions (Addendum)
I found and removed 2 polyps today - both look benign.  I will let you know pathology results and when to have another routine colonoscopy by mail.  I appreciate the opportunity to care for you. Gatha Mayer, MD, FACG  YOU HAD AN ENDOSCOPIC PROCEDURE TODAY AT [facility]:   Refer to the procedure report that was given to you for any specific questions about what was found during the examination.  If the procedure report does not answer your questions, please call your gastroenterologist to clarify.  If you requested that your care partner not be given the details of your procedure findings, then the procedure report has been included in a sealed envelope for you to review at your convenience later.  YOU SHOULD EXPECT: Some feelings of bloating in the abdomen. Passage of more gas than usual.  Walking can help get rid of the air that was put into your GI tract during the procedure and reduce the bloating. If you had a lower endoscopy (such as a colonoscopy or flexible sigmoidoscopy) you may notice spotting of blood in your stool or on the toilet paper. If you underwent a bowel prep for your procedure, you may not have a normal bowel movement for a few days.  Please Note:  You might notice some irritation and congestion in your nose or some drainage.  This is from the oxygen used during your procedure.  There is no need for concern and it should clear up in a day or so.  SYMPTOMS TO REPORT IMMEDIATELY:   Following lower endoscopy (colonoscopy or flexible sigmoidoscopy):  Excessive amounts of blood in the stool  Significant tenderness or worsening of abdominal pains  Swelling of the abdomen that is new, acute  Fever of 100F or higher  For urgent or emergent issues, a gastroenterologist can be reached at any hour by calling [phone].   DIET: Your first meal following the procedure should be a small meal and then it is ok to progress to your normal diet. Heavy  or fried foods are harder to digest and may make you feel nauseous or bloated.  Likewise, meals heavy in dairy and vegetables can increase bloating.  Drink plenty of fluids but you should avoid alcoholic beverages for 24 hours.  ACTIVITY:  You should plan to take it easy for the rest of today and you should NOT DRIVE or use heavy machinery until tomorrow (because of the sedation medicines used during the test).    FOLLOW UP: Our staff will call the number listed on your records the next business day following your procedure to check on you and address any questions or concerns that you may have regarding the information given to you following your procedure. If we do not reach you, we will leave a message.  However, if you are feeling well and you are not experiencing any problems, there is no need to return our call.  We will assume that you have returned to your regular daily activities without incident.  If any biopsies were taken you will be contacted by phone or by letter within the next 1-3 weeks.  Please call us at [phone] if you have not heard about the biopsies in 3 weeks.    SIGNATURES/CONFIDENTIALITY: You and/or your care partner have signed paperwork which will be entered into your electronic medical record.  These signatures attest to the fact that that the information above on your After Visit Summary has been reviewed and  is understood.  Full responsibility of the confidentiality of this discharge information lies with you and/or your care-partner.

## 2015-08-14 ENCOUNTER — Telehealth: Payer: Self-pay

## 2015-08-14 NOTE — Telephone Encounter (Signed)
  Follow up Call-  Call back number 08/13/2015  Post procedure Call Back phone  # [phone]  Permission to leave phone message Yes     Patient questions:  Do you have a fever, pain , or abdominal swelling? No. Pain Score  0 *  Have you tolerated food without any problems? Yes.    Have you been able to return to your normal activities? Yes.    Do you have any questions about your discharge instructions: Diet   No. Medications  No. Follow up visit  No.  Do you have questions or concerns about your Care? No.  Actions: * If pain score is 4 or above: No action needed, pain <4.

## 2015-08-20 ENCOUNTER — Encounter: Payer: Self-pay | Admitting: Internal Medicine

## 2015-08-20 DIAGNOSIS — Z86010 Personal history of colonic polyps: Secondary | ICD-10-CM

## 2015-08-20 NOTE — Progress Notes (Signed)
Quick Note:  ssp and adenoma max 12 mm so recall 2020 ______

## 2015-09-07 ENCOUNTER — Encounter (HOSPITAL_COMMUNITY)
Admission: EM | Disposition: A | Discharge status: Home / Self Care | Payer: Self-pay | Source: Home / Self Care | Attending: Cardiology

## 2015-09-07 ENCOUNTER — Telehealth: Payer: Self-pay | Admitting: General Practice

## 2015-09-07 ENCOUNTER — Emergency Department (HOSPITAL_COMMUNITY): Payer: Medicare Other

## 2015-09-07 ENCOUNTER — Inpatient Hospital Stay (HOSPITAL_COMMUNITY)
Admission: EM | Admit: 2015-09-07 | Discharge: 2015-09-08 | DRG: 247 | Disposition: A | Discharge status: Home / Self Care | Payer: Medicare Other | Attending: Cardiology | Admitting: Cardiology

## 2015-09-07 ENCOUNTER — Encounter (HOSPITAL_COMMUNITY): Payer: Self-pay | Admitting: Emergency Medicine

## 2015-09-07 DIAGNOSIS — Z7982 Long term (current) use of aspirin: Secondary | ICD-10-CM

## 2015-09-07 DIAGNOSIS — Z8249 Family history of ischemic heart disease and other diseases of the circulatory system: Secondary | ICD-10-CM

## 2015-09-07 DIAGNOSIS — M1990 Unspecified osteoarthritis, unspecified site: Secondary | ICD-10-CM | POA: Diagnosis present

## 2015-09-07 DIAGNOSIS — Z79899 Other long term (current) drug therapy: Secondary | ICD-10-CM

## 2015-09-07 DIAGNOSIS — H269 Unspecified cataract: Secondary | ICD-10-CM | POA: Diagnosis present

## 2015-09-07 DIAGNOSIS — Z87891 Personal history of nicotine dependence: Secondary | ICD-10-CM | POA: Diagnosis not present

## 2015-09-07 DIAGNOSIS — I4821 Permanent atrial fibrillation: Secondary | ICD-10-CM

## 2015-09-07 DIAGNOSIS — R079 Chest pain, unspecified: Secondary | ICD-10-CM

## 2015-09-07 DIAGNOSIS — I482 Chronic atrial fibrillation: Secondary | ICD-10-CM | POA: Diagnosis present

## 2015-09-07 DIAGNOSIS — I200 Unstable angina: Secondary | ICD-10-CM

## 2015-09-07 DIAGNOSIS — I2510 Atherosclerotic heart disease of native coronary artery without angina pectoris: Secondary | ICD-10-CM | POA: Diagnosis not present

## 2015-09-07 DIAGNOSIS — I25110 Atherosclerotic heart disease of native coronary artery with unstable angina pectoris: Secondary | ICD-10-CM | POA: Diagnosis present

## 2015-09-07 DIAGNOSIS — E785 Hyperlipidemia, unspecified: Secondary | ICD-10-CM | POA: Diagnosis present

## 2015-09-07 DIAGNOSIS — J9811 Atelectasis: Secondary | ICD-10-CM | POA: Diagnosis present

## 2015-09-07 DIAGNOSIS — K219 Gastro-esophageal reflux disease without esophagitis: Secondary | ICD-10-CM | POA: Diagnosis present

## 2015-09-07 DIAGNOSIS — F329 Major depressive disorder, single episode, unspecified: Secondary | ICD-10-CM | POA: Diagnosis present

## 2015-09-07 DIAGNOSIS — Z955 Presence of coronary angioplasty implant and graft: Secondary | ICD-10-CM

## 2015-09-07 DIAGNOSIS — R590 Localized enlarged lymph nodes: Secondary | ICD-10-CM | POA: Diagnosis present

## 2015-09-07 HISTORY — DX: Localized enlarged lymph nodes: R59.0

## 2015-09-07 HISTORY — DX: Other nonspecific abnormal finding of lung field: R91.8

## 2015-09-07 HISTORY — PX: CARDIAC CATHETERIZATION: SHX172

## 2015-09-07 HISTORY — DX: Atherosclerotic heart disease of native coronary artery without angina pectoris: I25.10

## 2015-09-07 LAB — CBC
HCT: 40.7 % (ref 39.0–52.0)
Hemoglobin: 13.4 g/dL (ref 13.0–17.0)
MCH: 33.6 pg (ref 26.0–34.0)
MCHC: 32.9 g/dL (ref 30.0–36.0)
MCV: 102 fL — ABNORMAL HIGH (ref 78.0–100.0)
PLATELETS: 185 10*3/uL (ref 150–400)
RBC: 3.99 MIL/uL — AB (ref 4.22–5.81)
RDW: 13.5 % (ref 11.5–15.5)
WBC: 13.4 10*3/uL — ABNORMAL HIGH (ref 4.0–10.5)

## 2015-09-07 LAB — BASIC METABOLIC PANEL
Anion gap: 10 (ref 5–15)
BUN: 12 mg/dL (ref 6–20)
CALCIUM: 9.6 mg/dL (ref 8.9–10.3)
CO2: 26 mmol/L (ref 22–32)
CREATININE: 0.87 mg/dL (ref 0.61–1.24)
Chloride: 100 mmol/L — ABNORMAL LOW (ref 101–111)
GFR calc Af Amer: >60 mL/min (ref >60)
GFR calc non Af Amer: >60 mL/min (ref >60)
Glucose, Bld: 129 mg/dL — ABNORMAL HIGH (ref 65–99)
Potassium: 4.2 mmol/L (ref 3.5–5.1)
Sodium: 136 mmol/L (ref 135–145)

## 2015-09-07 LAB — D-DIMER, QUANTITATIVE: D-Dimer, Quant: 0.56 ug/mL-FEU — ABNORMAL HIGH (ref 0.00–0.50)

## 2015-09-07 LAB — TROPONIN I: Troponin I: 0.18 ng/mL — ABNORMAL HIGH (ref <0.031)

## 2015-09-07 LAB — I-STAT TROPONIN, ED
TROPONIN I, POC: 0 ng/mL (ref 0.00–0.08)
Troponin i, poc: 0 ng/mL (ref 0.00–0.08)

## 2015-09-07 LAB — POCT ACTIVATED CLOTTING TIME
ACTIVATED CLOTTING TIME: 312 s
Activated Clotting Time: 240 seconds

## 2015-09-07 SURGERY — LEFT HEART CATH AND CORONARY ANGIOGRAPHY

## 2015-09-07 MED ORDER — ASPIRIN EC 81 MG PO TBEC
81.0000 mg | DELAYED_RELEASE_TABLET | Freq: Every day | ORAL | Status: DC
  Administered 2015-09-08: 81 mg via ORAL
  Filled 2015-09-07: qty 1

## 2015-09-07 MED ORDER — ATORVASTATIN CALCIUM 80 MG PO TABS: 80.0000 mg | ORAL_TABLET | Freq: Every day | ORAL | Status: DC

## 2015-09-07 MED ORDER — IOPAMIDOL (ISOVUE-370) INJECTION 76%
INTRAVENOUS | Status: AC
  Administered 2015-09-07: 70 mL
  Filled 2015-09-07: qty 100

## 2015-09-07 MED ORDER — ONDANSETRON HCL 4 MG/2ML IJ SOLN: 4.0000 mg | Freq: Four times a day (QID) | INTRAMUSCULAR | Status: DC | PRN

## 2015-09-07 MED ORDER — OMEGA-3-ACID ETHYL ESTERS 1 G PO CAPS
1.0000 g | ORAL_CAPSULE | Freq: Every day | ORAL | Status: DC
  Administered 2015-09-08: 10:00:00 1 g via ORAL
  Filled 2015-09-07: qty 1

## 2015-09-07 MED ORDER — SODIUM CHLORIDE 0.9 % IV SOLN: 250.0000 mL | INTRAVENOUS | Status: DC | PRN

## 2015-09-07 MED ORDER — HEPARIN (PORCINE) IN NACL 2-0.9 UNIT/ML-% IJ SOLN
INTRAMUSCULAR | Status: DC | PRN
Start: 2015-09-07 — End: 2015-09-07
  Administered 2015-09-07: 1000 mL

## 2015-09-07 MED ORDER — HEPARIN SODIUM (PORCINE) 1000 UNIT/ML IJ SOLN
INTRAMUSCULAR | Status: DC | PRN
Start: 2015-09-07 — End: 2015-09-07
  Administered 2015-09-07: 4000 [IU] via INTRAVENOUS
  Administered 2015-09-07: 3000 [IU] via INTRAVENOUS
  Administered 2015-09-07: 4000 [IU] via INTRAVENOUS

## 2015-09-07 MED ORDER — CLOPIDOGREL BISULFATE 75 MG PO TABS
75.0000 mg | ORAL_TABLET | Freq: Every day | ORAL | Status: DC
  Administered 2015-09-08: 75 mg via ORAL
  Filled 2015-09-07: qty 1

## 2015-09-07 MED ORDER — HEPARIN SODIUM (PORCINE) 1000 UNIT/ML IJ SOLN
INTRAMUSCULAR | Status: AC
  Filled 2015-09-07: qty 1

## 2015-09-07 MED ORDER — TIROFIBAN HCL IN NACL 5-0.9 MG/100ML-% IV SOLN
INTRAVENOUS | Status: DC | PRN
  Administered 2015-09-07: 0.15 ug/kg/min via INTRAVENOUS

## 2015-09-07 MED ORDER — CLOPIDOGREL BISULFATE 300 MG PO TABS
ORAL_TABLET | ORAL | Status: DC | PRN
  Administered 2015-09-07: 600 mg via ORAL

## 2015-09-07 MED ORDER — SODIUM CHLORIDE 0.9% FLUSH: 3.0000 mL | INTRAVENOUS | Status: DC | PRN

## 2015-09-07 MED ORDER — NITROGLYCERIN 1 MG/10 ML FOR IR/CATH LAB
INTRA_ARTERIAL | Status: DC | PRN
Start: 2015-09-07 — End: 2015-09-07
  Administered 2015-09-07 (×2): 200 ug via INTRA_ARTERIAL

## 2015-09-07 MED ORDER — FENTANYL CITRATE (PF) 100 MCG/2ML IJ SOLN
INTRAMUSCULAR | Status: AC
  Filled 2015-09-07: qty 2

## 2015-09-07 MED ORDER — ASPIRIN 81 MG PO CHEW
324.0000 mg | CHEWABLE_TABLET | Freq: Once | ORAL | Status: AC
  Administered 2015-09-07: 324 mg via ORAL
  Filled 2015-09-07: qty 4

## 2015-09-07 MED ORDER — SODIUM CHLORIDE 0.9 % IV SOLN
INTRAVENOUS | Status: DC | PRN
  Administered 2015-09-07: 250 mL
  Administered 2015-09-07: 73 mL/h via INTRAVENOUS

## 2015-09-07 MED ORDER — MIDAZOLAM HCL 2 MG/2ML IJ SOLN
INTRAMUSCULAR | Status: AC
  Filled 2015-09-07: qty 2

## 2015-09-07 MED ORDER — IOPAMIDOL (ISOVUE-370) INJECTION 76%
INTRAVENOUS | Status: DC | PRN
  Administered 2015-09-07: 170 mL via INTRA_ARTERIAL

## 2015-09-07 MED ORDER — TIROFIBAN HCL IN NACL 5-0.9 MG/100ML-% IV SOLN
0.1500 ug/kg/min | INTRAVENOUS | Status: AC
  Filled 2015-09-07: qty 100

## 2015-09-07 MED ORDER — LIDOCAINE HCL (PF) 1 % IJ SOLN
INTRAMUSCULAR | Status: DC | PRN
  Administered 2015-09-07: 2 mL via INTRADERMAL

## 2015-09-07 MED ORDER — ASPIRIN 81 MG PO CHEW: 81.0000 mg | CHEWABLE_TABLET | Freq: Every day | ORAL | Status: DC

## 2015-09-07 MED ORDER — HEPARIN (PORCINE) IN NACL 2-0.9 UNIT/ML-% IJ SOLN
INTRAMUSCULAR | Status: AC
  Filled 2015-09-07: qty 1000

## 2015-09-07 MED ORDER — IOPAMIDOL (ISOVUE-370) INJECTION 76%
INTRAVENOUS | Status: AC
  Filled 2015-09-07: qty 100

## 2015-09-07 MED ORDER — ACETAMINOPHEN 325 MG PO TABS: 650.0000 mg | ORAL_TABLET | ORAL | Status: DC | PRN

## 2015-09-07 MED ORDER — NITROGLYCERIN 1 MG/10 ML FOR IR/CATH LAB
INTRA_ARTERIAL | Status: AC
  Filled 2015-09-07: qty 10

## 2015-09-07 MED ORDER — FENTANYL CITRATE (PF) 100 MCG/2ML IJ SOLN
INTRAMUSCULAR | Status: DC | PRN
  Administered 2015-09-07: 25 ug via INTRAVENOUS

## 2015-09-07 MED ORDER — VITAMIN D 1000 UNITS PO TABS
2000.0000 [IU] | ORAL_TABLET | Freq: Every day | ORAL | Status: DC
  Administered 2015-09-08: 10:00:00 2000 [IU] via ORAL
  Filled 2015-09-07 (×2): qty 2

## 2015-09-07 MED ORDER — CLOPIDOGREL BISULFATE 300 MG PO TABS
ORAL_TABLET | ORAL | Status: AC
  Filled 2015-09-07: qty 2

## 2015-09-07 MED ORDER — MIDAZOLAM HCL 2 MG/2ML IJ SOLN
INTRAMUSCULAR | Status: DC | PRN
  Administered 2015-09-07: 1 mg via INTRAVENOUS

## 2015-09-07 MED ORDER — HEPARIN BOLUS VIA INFUSION
4000.0000 [IU] | Freq: Once | INTRAVENOUS | Status: AC
  Administered 2015-09-07: 4000 [IU] via INTRAVENOUS
  Filled 2015-09-07: qty 4000

## 2015-09-07 MED ORDER — ANGIOPLASTY BOOK
Freq: Once | Status: AC
Start: 2015-09-07 — End: 2015-09-07
  Administered 2015-09-07: 21:00:00
  Filled 2015-09-07: qty 1

## 2015-09-07 MED ORDER — ADENOSINE 12 MG/4ML IV SOLN
12.0000 mL | Freq: Once | INTRAVENOUS | Status: DC
  Filled 2015-09-07: qty 12

## 2015-09-07 MED ORDER — VERAPAMIL HCL 2.5 MG/ML IV SOLN
INTRAVENOUS | Status: DC | PRN
  Administered 2015-09-07: 10 mL via INTRA_ARTERIAL

## 2015-09-07 MED ORDER — NITROGLYCERIN 0.4 MG SL SUBL: 0.4000 mg | SUBLINGUAL_TABLET | SUBLINGUAL | Status: DC | PRN

## 2015-09-07 MED ORDER — VERAPAMIL HCL 2.5 MG/ML IV SOLN
INTRAVENOUS | Status: AC
  Filled 2015-09-07: qty 2

## 2015-09-07 MED ORDER — SODIUM CHLORIDE 0.9% FLUSH: 3.0000 mL | Freq: Two times a day (BID) | INTRAVENOUS | Status: DC

## 2015-09-07 MED ORDER — SODIUM CHLORIDE 0.9 % WEIGHT BASED INFUSION: 1.0000 mL/kg/h | INTRAVENOUS | Status: AC

## 2015-09-07 MED ORDER — TIROFIBAN HCL IN NACL 5-0.9 MG/100ML-% IV SOLN
INTRAVENOUS | Status: AC
  Filled 2015-09-07: qty 100

## 2015-09-07 MED ORDER — TIROFIBAN (AGGRASTAT) BOLUS VIA INFUSION
INTRAVENOUS | Status: DC | PRN
  Administered 2015-09-07: 1815 ug via INTRAVENOUS

## 2015-09-07 MED ORDER — HEPARIN (PORCINE) IN NACL 100-0.45 UNIT/ML-% IJ SOLN
900.0000 [IU]/h | INTRAMUSCULAR | Status: DC
  Administered 2015-09-07: 900 [IU]/h via INTRAVENOUS
  Filled 2015-09-07: qty 250

## 2015-09-07 MED ORDER — METOPROLOL TARTRATE 12.5 MG HALF TABLET
12.5000 mg | ORAL_TABLET | Freq: Two times a day (BID) | ORAL | Status: DC
  Administered 2015-09-07 – 2015-09-08 (×2): 12.5 mg via ORAL
  Filled 2015-09-07 (×2): qty 1

## 2015-09-07 MED ORDER — ADENOSINE (DIAGNOSTIC) 140MCG/KG/MIN
INTRAVENOUS | Status: DC | PRN
  Administered 2015-09-07: 140 ug/kg/min via INTRAVENOUS

## 2015-09-07 MED ORDER — ADULT MULTIVITAMIN W/MINERALS CH
1.0000 | ORAL_TABLET | Freq: Every day | ORAL | Status: DC
  Administered 2015-09-08: 1 via ORAL
  Filled 2015-09-07 (×2): qty 1

## 2015-09-07 SURGICAL SUPPLY — 24 items
BALLN EMERGE MR 3.0X12 (BALLOONS) ×3
BALLN ~~LOC~~ EUPHORA RX 4.5X8 (BALLOONS) ×3
BALLOON EMERGE MR 3.0X12 (BALLOONS) ×2 IMPLANT
BALLOON ~~LOC~~ EUPHORA RX 4.5X8 (BALLOONS) ×2 IMPLANT
CATH INFINITI 5 FR JL3.5 (CATHETERS) ×3 IMPLANT
CATH INFINITI 5FR ANG PIGTAIL (CATHETERS) ×3 IMPLANT
CATH INFINITI JR4 5F (CATHETERS) ×3 IMPLANT
CATH MICROCATH NAVVUS (MICROCATHETER) ×2 IMPLANT
DEVICE RAD COMP TR BAND LRG (VASCULAR PRODUCTS) ×3 IMPLANT
GLIDESHEATH SLEND SS 6F .021 (SHEATH) ×3 IMPLANT
GUIDE CATH RUNWAY 6FR CLS3 (CATHETERS) ×3 IMPLANT
GUIDE CATH RUNWAY 6FR CLS3.5 (CATHETERS) ×3 IMPLANT
GUIDE CATH RUNWAY 6FR FR4 (CATHETERS) ×3 IMPLANT
KIT ENCORE 26 ADVANTAGE (KITS) ×3 IMPLANT
KIT HEART LEFT (KITS) ×3 IMPLANT
MICROCATHETER NAVVUS (MICROCATHETER) ×3
PACK CARDIAC CATHETERIZATION (CUSTOM PROCEDURE TRAY) ×3 IMPLANT
STENT SYNERGY DES 4X16 (Permanent Stent) ×3 IMPLANT
SYR MEDRAD MARK V 150ML (SYRINGE) ×3 IMPLANT
TRANSDUCER W/STOPCOCK (MISCELLANEOUS) ×3 IMPLANT
TUBING CIL FLEX 10 FLL-RA (TUBING) ×3 IMPLANT
VALVE GUARDIAN II ~~LOC~~ HEMO (MISCELLANEOUS) ×3 IMPLANT
WIRE ASAHI PROWATER 180CM (WIRE) ×3 IMPLANT
WIRE SAFE-T 1.5MM-J .035X260CM (WIRE) ×3 IMPLANT

## 2015-09-07 NOTE — ED Notes (Signed)
Cath Lab called okay to bring patient to [facility] 5 holding

## 2015-09-07 NOTE — ED Notes (Addendum)
Pt c/o sudden onset of chest pain and SOB since yesterday. Pt c/o cough and respiratory symptoms x several months with yellow sputum. Pt denies N/V. Pt was evaluated by EMS last night and advised his EKG was normal so he did not go to the hospital. Pain increases with inspiration.

## 2015-09-07 NOTE — Telephone Encounter (Signed)
Received a fax from after hours team [facility] this morning to advise that patient had called complaining of SOB, and pain when breathing since yesterday, per disposition advised to go to ER. I called the patient to verify that he was following the advice. He stated his wife was driving him to [facility] as we spoke. Pt sounded breathless and very congested on the phone. I advised patient that he made the right decision and to contact the office as needed for follow up after ER visit.

## 2015-09-07 NOTE — Progress Notes (Signed)
ANTICOAGULATION CONSULT NOTE - Initial Consult  Pharmacy Consult for heparin Indication: chest pain/ACS  Allergies  Allergen Reactions  . Amiodarone Other (See Comments)    REACTION: elevated liver enzymes    Patient Measurements: Height: 6\' 3"  (190.5 cm) Weight: 160 lb (72.576 kg) IBW/kg (Calculated) : 84.5 Heparin Dosing Weight: 72.6kg  Vital Signs: BP: 112/68 mmHg (04/24 1130) Pulse Rate: 83 (04/24 1130)  Labs:  Recent Labs  09/07/15 0925  HGB 13.4  HCT 40.7  PLT 185  CREATININE 0.87    Estimated Creatinine Clearance: 81.1 mL/min (by C-G formula based on Cr of 0.87).   Medical History: Past Medical History  Diagnosis Date  . Hyperlipidemia   . Atrial fibrillation ([place])     Dr. Lovena Le  . Nonspecific elevation of levels of transaminase or lactic acid dehydrogenase (LDH)     PMH of ; ? due to Amiodarone   . Diverticulosis   . Anemia   . Depression   . Arthritis     hands, lower back  . Heart murmur   . Cataracts, bilateral     surgery  . GERD (gastroesophageal reflux disease)   . Hx of adenomatous colonic polyps 08/13/2015  . Pneumonia, bacterial 05/18/2012     Assessment: 44 yom CP on admit. Pharmacy consulted to dose heparin for ACS/CP. No AC pta. CBC wnl. No bleed documented.  Goal of Therapy:  Heparin level 0.3-0.7 units/ml Monitor platelets by anticoagulation protocol: Yes   Plan:  Heparin 4000 unit bolus Heparin at 900 units/h 8h HL, daily HL/CBC Mon s/sx bleeding  Elicia Lamp, PharmD, [facility], [facility], [facility] Clinical Pharmacist Pager [phone] 09/07/2015 1:38 PM

## 2015-09-07 NOTE — Interval H&P Note (Signed)
Cath Lab Visit (complete for each Cath Lab visit)  Clinical Evaluation Leading to the Procedure:   ACS: Yes.    Non-ACS:    Anginal Classification: CCS IV  Anti-ischemic medical therapy: Minimal Therapy (1 class of medications)  Non-Invasive Test Results: No non-invasive testing performed  Prior CABG: No previous CABG      History and Physical Interval Note:  09/07/2015 4:47 PM  Troy Willis  has presented today for surgery, with the diagnosis of urgent  The various methods of treatment have been discussed with the patient and family. After consideration of risks, benefits and other options for treatment, the patient has consented to  Procedure(s): Left Heart Cath and Coronary Angiography (N/A) as a surgical intervention .  The patient's history has been reviewed, patient examined, no change in status, stable for surgery.  I have reviewed the patient's chart and labs.  Questions were answered to the patient's satisfaction.     Mohamud Mrozek S.
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
## 2015-09-08 ENCOUNTER — Inpatient Hospital Stay (HOSPITAL_COMMUNITY): Payer: Medicare Other

## 2015-09-08 ENCOUNTER — Encounter (HOSPITAL_COMMUNITY): Payer: Self-pay | Admitting: Interventional Cardiology

## 2015-09-08 DIAGNOSIS — I4821 Permanent atrial fibrillation: Secondary | ICD-10-CM

## 2015-09-08 DIAGNOSIS — I2510 Atherosclerotic heart disease of native coronary artery without angina pectoris: Secondary | ICD-10-CM | POA: Diagnosis not present

## 2015-09-08 DIAGNOSIS — E785 Hyperlipidemia, unspecified: Secondary | ICD-10-CM

## 2015-09-08 DIAGNOSIS — R590 Localized enlarged lymph nodes: Secondary | ICD-10-CM

## 2015-09-08 DIAGNOSIS — I25110 Atherosclerotic heart disease of native coronary artery with unstable angina pectoris: Principal | ICD-10-CM

## 2015-09-08 LAB — LIPID PANEL
CHOL/HDL RATIO: 2.4 ratio
Cholesterol: 146 mg/dL (ref 0–200)
HDL: 61 mg/dL (ref >40)
LDL Cholesterol: 78 mg/dL (ref 0–99)
Triglycerides: 36 mg/dL (ref <150)
VLDL: 7 mg/dL (ref 0–40)

## 2015-09-08 LAB — BASIC METABOLIC PANEL
ANION GAP: 8 (ref 5–15)
BUN: 12 mg/dL (ref 6–20)
CO2: 23 mmol/L (ref 22–32)
Calcium: 8.7 mg/dL — ABNORMAL LOW (ref 8.9–10.3)
Chloride: 102 mmol/L (ref 101–111)
Creatinine, Ser: 0.71 mg/dL (ref 0.61–1.24)
GFR calc Af Amer: >60 mL/min (ref >60)
GLUCOSE: 108 mg/dL — AB (ref 65–99)
POTASSIUM: 4.1 mmol/L (ref 3.5–5.1)
Sodium: 133 mmol/L — ABNORMAL LOW (ref 135–145)

## 2015-09-08 LAB — CBC
HCT: 32.9 % — ABNORMAL LOW (ref 39.0–52.0)
Hemoglobin: 11 g/dL — ABNORMAL LOW (ref 13.0–17.0)
MCH: 33.8 pg (ref 26.0–34.0)
MCHC: 33.4 g/dL (ref 30.0–36.0)
MCV: 101.2 fL — ABNORMAL HIGH (ref 78.0–100.0)
PLATELETS: 161 10*3/uL (ref 150–400)
RBC: 3.25 MIL/uL — AB (ref 4.22–5.81)
RDW: 13.8 % (ref 11.5–15.5)
WBC: 9 10*3/uL (ref 4.0–10.5)

## 2015-09-08 LAB — TROPONIN I: Troponin I: 0.25 ng/mL — ABNORMAL HIGH (ref <0.031)

## 2015-09-08 MED ORDER — RIVAROXABAN 20 MG PO TABS: 20.0000 mg | ORAL_TABLET | Freq: Every day | ORAL | Status: DC

## 2015-09-08 MED ORDER — ASPIRIN 81 MG PO TABS: 81.0000 mg | ORAL_TABLET | Freq: Every day | ORAL | Status: AC

## 2015-09-08 MED ORDER — ATORVASTATIN CALCIUM 80 MG PO TABS: 80.0000 mg | ORAL_TABLET | Freq: Every day | ORAL | Status: DC

## 2015-09-08 MED ORDER — METOPROLOL TARTRATE 25 MG PO TABS: 12.5000 mg | ORAL_TABLET | Freq: Two times a day (BID) | ORAL | Status: DC

## 2015-09-08 MED ORDER — NITROGLYCERIN 0.4 MG SL SUBL: 0.4000 mg | SUBLINGUAL_TABLET | SUBLINGUAL | Status: DC | PRN

## 2015-09-08 MED ORDER — CLOPIDOGREL BISULFATE 75 MG PO TABS: 75.0000 mg | ORAL_TABLET | Freq: Every day | ORAL | Status: DC

## 2015-09-08 NOTE — Progress Notes (Signed)
TR BAND REMOVAL  LOCATION:    right radial  DEFLATED PER PROTOCOL:    Yes.    TIME BAND OFF / DRESSING APPLIED:    23:45   SITE UPON ARRIVAL:    Level 0  SITE AFTER BAND REMOVAL:    Level 0  CIRCULATION SENSATION AND MOVEMENT:    Within Normal Limits   Yes.    COMMENTS:   Post TR band instructions given. Pt tolerated well. 

## 2015-09-08 NOTE — Care Management Note (Signed)
Case Management Note  Patient Details  Name: Troy Willis MRN: [id number] Date of Birth: [birth year]  Subjective/Objective:     Patient lives with spouse, pta indep.  Wife will be with patient today at home.  Patient will be on plavix and xarelto- co pay $47 , NCM confirmed that [facility] has plavix and xarelto in stock.  Patient has 30 day free savings card.  No other needs.               Action/Plan:   Expected Discharge Date:                  Expected Discharge Plan:  Home/Self Care  In-House Referral:     Discharge planning Services  CM Consult  Post Acute Care Choice:    Choice offered to:     DME Arranged:    DME Agency:     HH Arranged:    [place] Agency:     Status of Service:  Completed, signed off  Medicare Important Message Given:    Date Medicare IM Given:    Medicare IM give by:    Date Additional Medicare IM Given:    Additional Medicare Important Message give by:     If discussed at [place] of Stay Meetings, dates discussed:    Additional Comments:  Zenon Mayo, RN 09/08/2015, 10:33 AM

## 2015-09-08 NOTE — Discharge Instructions (Signed)
**PLEASE REMEMBER TO BRING ALL OF YOUR MEDICATIONS TO EACH OF YOUR FOLLOW-UP OFFICE VISITS.  NO HEAVY LIFTING OR SEXUAL ACTIVITY X 7 DAYS. NO DRIVING X 3-5 DAYS. NO SOAKING BATHS, HOT TUBS, POOLS, ETC., X 7 DAYS.  Radial Site Care Refer to this sheet in the next few weeks. These instructions provide you with information on caring for yourself after your procedure. Your caregiver may also give you more specific instructions. Your treatment has been planned according to current medical practices, but problems sometimes occur. Call your caregiver if you have any problems or questions after your procedure. HOME CARE INSTRUCTIONS  You may shower the day after the procedure.Remove the bandage (dressing) and gently wash the site with plain soap and water.Gently pat the site dry.   Do not apply powder or lotion to the site.   Do not submerge the affected site in water for 3 to 5 days.   Inspect the site at least twice daily.   Do not flex or bend the affected arm for 24 hours.   No lifting over 5 pounds (2.3 kg) for 5 days after your procedure.   Do not drive home if you are discharged the same day of the procedure. Have someone else drive you.   What to expect:  Any bruising will usually fade within 1 to 2 weeks.   Blood that collects in the tissue (hematoma) may be painful to the touch. It should usually decrease in size and tenderness within 1 to 2 weeks.  SEEK IMMEDIATE MEDICAL CARE IF:  You have unusual pain at the radial site.   You have redness, warmth, swelling, or pain at the radial site.   You have drainage (other than a small amount of blood on the dressing).   You have chills.   You have a fever or persistent symptoms for more than 72 hours.   You have a fever and your symptoms suddenly get worse.   Your arm becomes pale, cool, tingly, or numb.   You have heavy bleeding from the site. Hold pressure on the site.       10 Habits of Highly Healthy [place] wants to help you get well and stay well.  Live a longer, healthier life by practicing healthy habits every day.  1.  Visit your primary care provider regularly. 2.  Make time for family and friends.  Healthy relationships are important. 3.  Take medications as directed by your provider. 4.  Maintain a healthy weight and a trim waistline. 5.  Eat healthy meals and snacks, rich in fruits, vegetables, whole grains, and lean proteins. 6.  Get moving every day - aim for 150 minutes of moderate physical activity each week. 7.  Don't smoke. 8.  Avoid alcohol or drink in moderation. 9.  Manage stress through meditation or mindful relaxation. 10.  Get seven to nine hours of quality sleep each night.  Want more information on healthy habits?  To learn more about these and other healthy habits, visit [employer]. _____________   Information on my medicine - XARELTO (Rivaroxaban)   Why was Xarelto prescribed for you? Xarelto was prescribed for you to reduce the risk of a blood clot forming that can cause a stroke if you have a medical condition called atrial fibrillation (a type of irregular heartbeat).  What do you need to know about xarelto ? Take your Xarelto ONCE DAILY at the same time every day with your evening meal. If you have difficulty  swallowing the tablet whole, you may crush it and mix in applesauce just prior to taking your dose.  Take Xarelto exactly as prescribed by your doctor and DO NOT stop taking Xarelto without talking to the doctor who prescribed the medication.  Stopping without other stroke prevention medication to take the place of Xarelto may increase your risk of developing a clot that causes a stroke.  Refill your prescription before you run out.  After discharge, you should have regular check-up appointments with your healthcare provider that is prescribing your Xarelto.  In the future your dose may need to be changed if your kidney function  or weight changes by a significant amount.  What do you do if you miss a dose? If you are taking Xarelto ONCE DAILY and you miss a dose, take it as soon as you remember on the same day then continue your regularly scheduled once daily regimen the next day. Do not take two doses of Xarelto at the same time or on the same day.   Important Safety Information A possible side effect of Xarelto is bleeding. You should call your healthcare provider right away if you experience any of the following: ? Bleeding from an injury or your nose that does not stop. ? Unusual colored urine (red or dark brown) or unusual colored stools (red or black). ? Unusual bruising for unknown reasons. ? A serious fall or if you hit your head (even if there is no bleeding).  Some medicines may interact with Xarelto and might increase your risk of bleeding while on Xarelto. To help avoid this, consult your healthcare provider or pharmacist prior to using any new prescription or non-prescription medications, including herbals, vitamins, non-steroidal anti-inflammatory drugs (NSAIDs) and supplements.  This website has more information on Xarelto: [email or web address]-benson.[email or web address]

## 2015-09-08 NOTE — Discharge Summary (Signed)
Discharge Summary    Patient ID: Troy Willis,  MRN: [id number], DOB/AGE: 1945/11/09 70 y.o.  Admit date: 09/07/2015 Discharge date: 09/08/2015  Primary Care Provider: Annye Asa Primary Cardiologist: T. Radford Pax, MD / G. Lovena Le, MD   Discharge Diagnoses    Principal Problem:   Unstable angina ([place])  **S/P PCI/DES of the proximal RCA this admission.  Active Problems:   Coronary artery calcification seen on CAT scan   CAD (coronary artery disease), native coronary artery   Chronic atrial fibrillation (HCC)  **Initiated on Xarelto this admission in the setting of a CHA2DS2VASc =  2.   Hilar adenopathy  **Incidentally noted on CTA Chest this admission.   Hyperlipidemia  Allergies Allergies  Allergen Reactions  . Amiodarone Other (See Comments)    REACTION: elevated liver enzymes    Diagnostic Studies/Procedures    CT Angio of the Chest with Contrast 4.24.2017  IMPRESSION: No evidence of acute pulmonary thromboembolism.   There is mild bilateral hilar adenopathy as well as low density and soft tissue filling of airways primarily in the left lower lobe. At least 1 segmental airway is occluded. These findings may represent mucoid impaction. Underlying endobronchial mass or lesion cannot be excluded. Bronchoscopy may be helpful.   Dependent bilateral atelectasis and airspace disease. Reticulonodular opacities in the right upper lobe. Findings most likely represent an inflammatory process. _____________   Cardiac Catheterization and Percutaneous Coronary Intervention 4.24.2017  Coronary Findings     Dominance: Right    Left Anterior Descending   . Mid LAD lesion, 50% stenosed. Discrete. (FFR normal @ 0.84)      Left Circumflex  . Vessel is small.      Right Coronary Artery  The vessel exhibits minimal luminal irregularities.   . Prox RCA lesion, 80% stenosed. The lesion is type non-C.   [name] PCI: The RCA was successfully stented using a 4.0 x 16  Synergy DES.      _____________   History of Present Illness     70 year old male with prior history of chronic atrial fibrillation. He was in his usual state of health until the day prior to admission, when he had an episode of chest pain while driving. Pain lasted several hours and he finally called EMS at approximately 10 PM on April 23. An ECG was apparently performed and normal. He did not seek further medical attention. He continued to have discomfort throughout the night and called his PCP on the morning of April 24. He was advised to present to the ED where ECG was notable for inferior and anterolateral ST elevation.  Initial troponin was normal. D-dimer was elevated and CT angiography of the chest was performed and was negative for pulmonary embolus however he was incidentally noted to have three-vessel coronary artery calcification as well as mild bilateral hilar adenopathy and evidence of low density and soft tissue filling airway filling defects.  He was seen by our team and admitted for further evaluation and management of unstable angina.  Hospital Course     Consultants: None   Given ongoing chest discomfort and ECG changes, decision was made to pursue diagnostic catheterization. This was performed on April 24 and revealed an 80% stenosis in the proximal right coronary artery. He also had a 50% stenosis in the mid LAD however fractional flow reserve was normal at that site. The RCA was felt to be the culprit lesion and this was successfully stented using a 4.0 x 16 mm Synergy drug eluting stent. Patient  tolerated procedure well and postprocedure had no further chest discomfort. Postprocedure troponins were mildly elevated.  In light of a new finding of coronary artery disease, his CHA2DS2VASc is now 2. In that setting, we have placed him on xarelto therapy. He will be discharged on ASA, plavix, and xarelto however, we plan to limit his aspirin exposure to just 30 days and he will  continue on plavix and xarelto after that point.  With regards to his abnormal CT findings.  We have arranged for follow-up in pulmonology clinic on 5/12.  CT findings have been discussed with patient and family.  He will be discharged home today in good condition. _____________  Physical Exam    Discharge Vitals Blood pressure 109/77, pulse 88, temperature 98.4 F (36.9 C), temperature source Oral, resp. rate 15, height 6\' 3"  (1.905 m), weight 160 lb 15 oz (73 kg), SpO2 96 %.  Filed Weights   09/07/15 0859 09/08/15 0511  Weight: 160 lb (72.576 kg) 160 lb 15 oz (73 kg)   General: Pleasant, NAD. Neuro: Alert and oriented X 3. Moves all extremities spontaneously. Psych: Normal affect. HEENT:  Normal  Neck: Supple without bruits or JVD. Lungs:  Resp regular and unlabored, CTA. Heart: RRR no s3, s4, or murmurs. Abdomen: Soft, non-tender, non-distended, BS + x 4.  Extremities: No clubbing, cyanosis or edema. DP/PT/Radials 2+ and equal bilaterally.  Labs & Radiologic Studies    CBC  Recent Labs  09/07/15 0925 09/08/15 0203  WBC 13.4* 9.0  HGB 13.4 11.0*  HCT 40.7 32.9*  MCV 102.0* 101.2*  PLT 185 [id number]   Basic Metabolic Panel  Recent Labs  09/07/15 0925 09/08/15 0203  NA 136 133*  K 4.2 4.1  CL 100* 102  CO2 26 23  GLUCOSE 129* 108*  BUN 12 12  CREATININE 0.87 0.71  CALCIUM 9.6 8.7*   Cardiac Enzymes  Recent Labs  09/07/15 1419 09/07/15 1950 09/08/15 0203  TROPONINI <0.03 0.18* 0.25*   D-Dimer  Recent Labs  09/07/15 0925  DDIMER 0.56*   Fasting Lipid Panel  Recent Labs  09/08/15 0203  CHOL 146  HDL 61  LDLCALC 78  TRIG 36  CHOLHDL 2.4  _____________  Ct Angio Chest Pe W/cm &/or Wo Cm  09/07/2015  CLINICAL DATA:  Short of breath and chest pain for 1 day EXAM: CT ANGIOGRAPHY CHEST WITH CONTRAST TECHNIQUE: Multidetector CT imaging of the chest was performed using the standard protocol during bolus administration of intravenous contrast.  Multiplanar CT image reconstructions and MIPs were obtained to evaluate the vascular anatomy. CONTRAST:  70 cc Isovue 370 COMPARISON:  None. FINDINGS: There are no filling defects in the pulmonary arterial tree to suggest acute pulmonary thromboembolism. Small mediastinal nodes. Three vessel coronary artery calcification. There is mild soft tissue prominence in the inferior right hilum on image 78 of series 4. Borderline right hilar adenopathy is suspected. There is similar soft tissue in the left hilum. There is also areas of soft tissue and low-density material filling left lower lobe bronchial airways. At least 1 airway is occluded. See image 75 of series 4 No pneumothorax.  No pleural effusion.  No pericardial effusion. Reticulonodular opacities are present in the anterior right upper lobe. The largest nodule is 5 mm. Calcified granuloma in the left upper lobe is noted on image 53 of series 5 there are dependent patchy and heterogeneous opacities in both lungs likely reflecting a combination of airspace disease and volume loss. No vertebral compression deformity. Review of  the MIP images confirms the above findings. IMPRESSION: No evidence of acute pulmonary thromboembolism. There is mild bilateral hilar adenopathy as well as low density and soft tissue filling of airways primarily in the left lower lobe. At least 1 segmental airway is occluded. These findings may represent mucoid impaction. Underlying endobronchial mass or lesion cannot be excluded. Bronchoscopy may be helpful. Dependent bilateral atelectasis and airspace disease. Reticulonodular opacities in the right upper lobe. Findings most likely represent an inflammatory process. Electronically Signed   By: Marybelle Killings M.D.   On: 09/07/2015 11:24   Disposition   Pt is being discharged home today in good condition.  Follow-up Plans & Appointments    Follow-up Information    Follow up with Murray Hodgkins, NP On 09/24/2015.   Specialties:   Nurse Practitioner, Cardiology, Radiology   Why:  10:30 AM - Dr. Theodosia Blender Nurse Practitioner   Contact information:   [address] [address] [phone]       Follow up with [facility], MD On 09/25/2015.   Specialty:  Pulmonary Disease   Why:  3:45 PM - [facility] [place] [facility] to follow-up Chest CT   Contact information:   [place] [address] [phone]       Discharge Medications   Current Discharge Medication List    START taking these medications   Details  atorvastatin (LIPITOR) 80 MG tablet Take 1 tablet (80 mg total) by mouth daily at 6 PM. Qty: 30 tablet, Refills: 6    clopidogrel (PLAVIX) 75 MG tablet Take 1 tablet (75 mg total) by mouth daily with breakfast. Qty: 30 tablet, Refills: 6    metoprolol tartrate (LOPRESSOR) 25 MG tablet Take 0.5 tablets (12.5 mg total) by mouth 2 (two) times daily. Qty: 30 tablet, Refills: 3    nitroGLYCERIN (NITROSTAT) 0.4 MG SL tablet Place 1 tablet (0.4 mg total) under the tongue every 5 (five) minutes x 3 doses as needed for chest pain. Qty: 25 tablet, Refills: 3    rivaroxaban (XARELTO) 20 MG TABS tablet Take 1 tablet (20 mg total) by mouth daily with supper. Qty: 30 tablet, Refills: 6      CONTINUE these medications which have CHANGED   Details  aspirin 81 MG tablet Take 1 tablet (81 mg total) by mouth daily. X 30 days      CONTINUE these medications which have NOT CHANGED   Details  albuterol (PROVENTIL HFA;VENTOLIN HFA) 108 (90 Base) MCG/ACT inhaler Inhale 2 puffs into the lungs every 4 (four) hours as needed for wheezing or shortness of breath. Qty: 1 Inhaler, Refills: 0    Biotin 5000 MCG CAPS Take by mouth daily.    Cholecalciferol (VITAMIN D3) 2000 UNITS TABS Take by mouth daily.    Multiple Vitamin (MULTIVITAMIN) tablet Take 1 tablet by mouth daily.    Omega-3 Fatty Acids (FISH OIL) 1000 MG CAPS Take 1 capsule by mouth daily.     benzonatate (TESSALON) 200 MG  capsule Take 1 capsule (200 mg total) by mouth 3 (three) times daily as needed for cough. Qty: 60 capsule, Refills: 0    HYDROcodone-GuaiFENesin 2.5-200 MG/5ML SOLN Take 5 mLs by mouth at bedtime as needed.      STOP taking these medications     celecoxib (CELEBREX) 200 MG capsule         Aspirin prescribed at discharge?  Yes High Intensity Statin Prescribed? (Lipitor 40-80mg  or Crestor 20-40mg ): Yes Beta Blocker Prescribed? Yes For EF <40%, was  ACEI/ARB Prescribed? No: N/A ADP Receptor Inhibitor Prescribed? (i.e. Plavix etc.-Includes Medically Managed Patients): Yes For EF <40%, Aldosterone Inhibitor Prescribed? No: N/A Was EF assessed during THIS hospitalization? Yes Was Cardiac Rehab II ordered? (Included Medically managed Patients): Yes   Outstanding Labs/Studies   Follow up lipids/lft's in 8 wks.  Duration of Discharge Encounter   Greater than 30 minutes including physician time.  Signed, Murray Hodgkins NP 09/08/2015, 9:11 AM

## 2015-09-08 NOTE — Progress Notes (Signed)
CARDIAC REHAB PHASE I   PRE:  Rate/Rhythm: 87 a fib  BP:  Sitting: 109/77        SaO2: 96 RA  MODE:  Ambulation: 800 ft   POST:  Rate/Rhythm: 105 a fib  BP:  Sitting: 124/81         SaO2: 99 RA  Pt ambulated 800 ft on RA, independent, steady gait, tolerated well, no complaints. Completed PCI/stent education with pt, pt wife and daughter at bedside.  Reviewed risk factors, anti-platelet therapy, stent card, activity restrictions, ntg, exercise, heart healthy diet, and phase 2 cardiac rehab. Pt verbalized understanding, pt and family receptive to education, asked appropriate questions. Pt agrees to phase 2 cardiac rehab referral, will send to [facility] per pt request. Pt to bed per pt request after walk, call bell within reach. Pt to see case manager regarding xarelto prior to discharge.  [id number] [name], RN, BSN 09/08/2015 9:25 AM

## 2015-09-09 ENCOUNTER — Telehealth: Payer: Self-pay | Admitting: Cardiology

## 2015-09-09 ENCOUNTER — Telehealth: Payer: Self-pay

## 2015-09-09 LAB — HEMOGLOBIN A1C
HEMOGLOBIN A1C: 6 % — AB (ref 4.8–5.6)
MEAN PLASMA GLUCOSE: 126 mg/dL

## 2015-09-09 NOTE — Telephone Encounter (Signed)
Telephone note already in system, pt was contacted by our office and sent to [facility] ER as well.

## 2015-09-09 NOTE — Telephone Encounter (Signed)
New message      Pt had a stent put in yesterday by Dr Radford Pax.  He said she told him she was going to prescribe metoprolol.  When he picked up his medications yesterday, this was not included.  Calling to see if he is to still be on metoprolol?  Please call

## 2015-09-09 NOTE — Telephone Encounter (Signed)
Confirmed with patient he is to take Lopressor 25 mg 1/2 tablet BID. Patient was grateful for call.

## 2015-09-09 NOTE — Telephone Encounter (Signed)
Patient Name: Troy Willis Gender: Male DOB: 05/15/1946 Age: 70 Y 10 M 10 D Return Phone Number: [phone] (Primary), [phone] (Secondary) Address: City/State/Zip: [place] Client Site [place] - Night Physician Dimple Nanas Contact Type Call Who Is Calling Patient / Member / Family / Caregiver Call Type Triage / Clinical Relationship To Patient Self Return Phone Number [phone] (Primary) Chief Complaint BREATHING - shortness of breath or sounds breathless Reason for Call Symptomatic / Request for [place] states he is having trouble with breathing and pain with breathing. PreDisposition Call Doctor Translation No Nurse Assessment Nurse: Vallery Sa, RN, Tye [place] Date/Time (Eastern Time): 09/07/2015 7:51:04 AM Confirm and document reason for call. If symptomatic, describe symptoms. You must click the next button to save text entered. ---Caller states he developed breathing difficulty and pain with breathing yesterday that is still present this morning. No blueness around his lips. He has not coughed up blood. No injury in the past 3 days. Has the patient traveled out of the country within the last 30 days? ---No Does the patient have any new or worsening symptoms? ---Yes Will a triage be completed? ---Yes Related visit to physician within the last 2 weeks? ---No Does the PT have any chronic conditions? (i.e. diabetes, asthma, etc.) ---Yes List chronic conditions. ---Upper respiratory problems since Dec 2016, A-Fib Is this a behavioral health or substance abuse call? ---No Guidelines Guideline Title Affirmed Question Affirmed Notes Nurse Date/Time Eilene Ghazi Time) Chest Pain [1] Chest pain lasts > 5 minutes AND [2] age > 60 Vallery Sa, RN, Tye [place] 09/07/2015 7:53:29 AM Disp. Time Eilene Ghazi Time) Disposition Final User 09/07/2015 7:48:43 AM Send to  Urgent Queue Caryl Comes PLEASE NOTE: All timestamps contained within this report are represented as [place] Standard Time. CONFIDENTIALTY NOTICE: This fax transmission is intended only for the addressee. It contains information that is legally privileged, confidential or otherwise protected from use or disclosure. If you are not the intended recipient, you are strictly prohibited from reviewing, disclosing, copying using or disseminating any of this information or taking any action in reliance on or regarding this information. If you have received this fax in error, please notify us immediately by telephone so that we can arrange for its return to [place]. Phone: [phone], Toll-Free: [phone], Fax: [phone] Page: 2 of 2 Call Id: [id number] 09/07/2015 7:58:50 AM Call EMS 43 Now Yes Vallery Sa, RN, Rosey [place] Understands: Yes Disagree/Comply: Disagree Disagree/Comply Reason: Disagree with instructions Care Advice Given Per Guideline CALL EMS 911 NOW: Immediate medical attention is needed. You need to hang up and call 911 (or an ambulance). Psychologist, forensic Discretion: I'll call you back in a few minutes to be sure you were able to reach them.) CARE ADVICE given per Chest Pain (Adult) guideline. Comments User: Berton Mount, RN Date/Time (Eastern Time): 09/07/2015 8:00:10 AM Caller having chest pain and breathing difficulty. He declined the Call 911 disposition. Reinforced the Call 911 disposition. He plans to have his wife drive him to [facility] ER.

## 2015-09-17 ENCOUNTER — Telehealth: Payer: Self-pay | Admitting: Nurse Practitioner

## 2015-09-21 ENCOUNTER — Encounter: Payer: Self-pay | Admitting: Physician Assistant

## 2015-09-21 NOTE — Telephone Encounter (Signed)
Close encounter 

## 2015-09-24 ENCOUNTER — Encounter: Payer: Medicare Other | Admitting: Nurse Practitioner
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
## 2015-09-25 ENCOUNTER — Encounter: Payer: Self-pay | Admitting: Internal Medicine

## 2015-09-25 ENCOUNTER — Ambulatory Visit (INDEPENDENT_AMBULATORY_CARE_PROVIDER_SITE_OTHER): Payer: Medicare Other | Admitting: Internal Medicine

## 2015-09-25 ENCOUNTER — Encounter: Payer: Self-pay | Admitting: Physician Assistant

## 2015-09-25 ENCOUNTER — Ambulatory Visit (INDEPENDENT_AMBULATORY_CARE_PROVIDER_SITE_OTHER): Payer: Medicare Other | Admitting: Physician Assistant

## 2015-09-25 VITALS — BP 112/70 | HR 74 | Ht 75.0 in | Wt 168.0 lb

## 2015-09-25 VITALS — BP 98/68 | HR 57 | Ht 75.0 in | Wt 165.2 lb

## 2015-09-25 DIAGNOSIS — E785 Hyperlipidemia, unspecified: Secondary | ICD-10-CM | POA: Diagnosis not present

## 2015-09-25 DIAGNOSIS — R05 Cough: Secondary | ICD-10-CM | POA: Diagnosis not present

## 2015-09-25 DIAGNOSIS — R5383 Other fatigue: Secondary | ICD-10-CM

## 2015-09-25 DIAGNOSIS — I2510 Atherosclerotic heart disease of native coronary artery without angina pectoris: Secondary | ICD-10-CM

## 2015-09-25 DIAGNOSIS — R634 Abnormal weight loss: Secondary | ICD-10-CM

## 2015-09-25 DIAGNOSIS — R0982 Postnasal drip: Secondary | ICD-10-CM | POA: Diagnosis not present

## 2015-09-25 DIAGNOSIS — R9389 Abnormal findings on diagnostic imaging of other specified body structures: Secondary | ICD-10-CM

## 2015-09-25 DIAGNOSIS — R490 Dysphonia: Secondary | ICD-10-CM | POA: Diagnosis not present

## 2015-09-25 DIAGNOSIS — J387 Other diseases of larynx: Secondary | ICD-10-CM | POA: Insufficient documentation

## 2015-09-25 DIAGNOSIS — I482 Chronic atrial fibrillation, unspecified: Secondary | ICD-10-CM

## 2015-09-25 DIAGNOSIS — R938 Abnormal findings on diagnostic imaging of other specified body structures: Secondary | ICD-10-CM | POA: Diagnosis not present

## 2015-09-25 DIAGNOSIS — R053 Chronic cough: Secondary | ICD-10-CM | POA: Insufficient documentation

## 2015-09-25 LAB — NITRIC OXIDE: Nitric Oxide: 18
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
## 2015-09-25 NOTE — Patient Instructions (Addendum)
Medication Instructions:  Your physician has recommended you make the following change in your medication:  1. STOP the Lopressor    Labwork: None ordered  Testing/Procedures: None ordered  Follow-Up: Your physician recommends that you schedule a follow-up appointment in: [place]    Any Other Special Instructions Will Be Listed Below (If Applicable).    If you need a refill on your cardiac medications before your next appointment, please call your pharmacy.

## 2015-09-25 NOTE — Patient Instructions (Addendum)
ICD-9-CM ICD-10-CM   1. Chronic cough 786.2 R05   2. Hoarse voice quality 784.42 R49.0   3. Post-nasal drip 784.91 R09.82   4. Other fatigue 780.79 R53.83   5. 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 Loss of weight 783.21 R63.4    Please stop fish oil - can make acid reflux worse  Please do Sinus CT chest Please do HRCT chest supine and prone   - on 2nd thoughts will just go ahead and get it for better definition  - I have writen to chest radiologist about your ER CT  - we can cancel it if chest radiologist feels no need  Please do full PFT  Followup  - do above tests next week and call our office [phone] esp after PFT done  - I will reivew results and get back to you about plan for bronchoscopy  - based on above might need ENT and GI consultation as well

## 2015-09-29 ENCOUNTER — Telehealth: Payer: Self-pay | Admitting: Internal Medicine

## 2015-09-29 ENCOUNTER — Encounter (HOSPITAL_COMMUNITY)
Admission: RE | Admit: 2015-09-29 | Discharge: 2015-09-29 | Disposition: A | Discharge status: Home / Self Care | Payer: Medicare Other | Source: Ambulatory Visit | Attending: Cardiology | Admitting: Cardiology

## 2015-09-29 VITALS — Ht 74.5 in | Wt 168.2 lb

## 2015-09-29 DIAGNOSIS — Z86010 Personal history of colonic polyps: Secondary | ICD-10-CM | POA: Insufficient documentation

## 2015-09-29 DIAGNOSIS — Z955 Presence of coronary angioplasty implant and graft: Secondary | ICD-10-CM | POA: Insufficient documentation

## 2015-09-29 DIAGNOSIS — E785 Hyperlipidemia, unspecified: Secondary | ICD-10-CM | POA: Insufficient documentation

## 2015-09-29 DIAGNOSIS — K219 Gastro-esophageal reflux disease without esophagitis: Secondary | ICD-10-CM | POA: Insufficient documentation

## 2015-09-29 DIAGNOSIS — Z7982 Long term (current) use of aspirin: Secondary | ICD-10-CM | POA: Diagnosis not present

## 2015-09-29 DIAGNOSIS — I482 Chronic atrial fibrillation: Secondary | ICD-10-CM | POA: Insufficient documentation

## 2015-09-29 DIAGNOSIS — Z79899 Other long term (current) drug therapy: Secondary | ICD-10-CM | POA: Diagnosis not present

## 2015-09-29 NOTE — Telephone Encounter (Signed)
D/w radiology - unclear what is going on with ER Ct. Go ahead with HRCT supine and prone - same time he is getting sinus CT

## 2015-09-29 NOTE — Progress Notes (Addendum)
Cardiac Rehab Medication Review by a Pharmacist  Does the patient  feel that his/her medications are working for him/her?  yes  Has the patient been experiencing any side effects to the medications prescribed?  no  Does the patient measure his/her own blood pressure or blood glucose at home?  yes   Does the patient have any problems obtaining medications due to transportation or finances?   no  Understanding of regimen: good Understanding of indications: fair Potential of compliance: good    Pharmacist comments: Mr. Rayas has good understanding of his medications and endorses compliance. I went over the indications of his medications.   Joya San, PharmD Clinical Pharmacy Resident Pager # [phone] 09/29/2015 1:50 PM

## 2015-09-30 NOTE — Progress Notes (Signed)
Cardiac Individual Treatment Plan  Patient Details  Name: Troy Willis MRN: [id number] Date of Birth: 1945/12/21 Referring Provider:        CARDIAC REHAB PHASE II ORIENTATION from 09/29/2015 in [place]   Referring Provider  Fransico Him, MD      Initial Encounter Date:       CARDIAC REHAB PHASE II ORIENTATION from 09/29/2015 in [place]   Date  09/29/15   Referring Provider  Fransico Him, MD      Visit Diagnosis: Stented coronary artery  Patient's Home Medications on Admission:  Current outpatient prescriptions:  .  albuterol (PROVENTIL HFA;VENTOLIN HFA) 108 (90 Base) MCG/ACT inhaler, Inhale 2 puffs into the lungs every 4 (four) hours as needed for wheezing or shortness of breath., Disp: 1 Inhaler, Rfl: 0 .  aspirin 81 MG tablet, Take 1 tablet (81 mg total) by mouth daily. X 30 days, Disp: , Rfl:  .  atorvastatin (LIPITOR) 80 MG tablet, Take 1 tablet (80 mg total) by mouth daily at 6 PM., Disp: 30 tablet, Rfl: 6 .  Biotin 5000 MCG CAPS, Take by mouth daily., Disp: , Rfl:  .  celecoxib (CELEBREX) 50 MG capsule, Take 50 mg by mouth daily as needed for pain (Hand inflammation)., Disp: , Rfl:  .  Cholecalciferol (VITAMIN D3) 2000 UNITS TABS, Take by mouth daily., Disp: , Rfl:  .  clopidogrel (PLAVIX) 75 MG tablet, Take 1 tablet (75 mg total) by mouth daily with breakfast., Disp: 30 tablet, Rfl: 6 .  HYDROcodone-GuaiFENesin 2.5-200 MG/5ML SOLN, Take 5 mLs by mouth at bedtime as needed., Disp: , Rfl:  .  Multiple Vitamin (MULTIVITAMIN) tablet, Take 1 tablet by mouth daily., Disp: , Rfl:  .  nitroGLYCERIN (NITROSTAT) 0.4 MG SL tablet, Place 1 tablet (0.4 mg total) under the tongue every 5 (five) minutes x 3 doses as needed for chest pain., Disp: 25 tablet, Rfl: 3 .  ranitidine (ZANTAC) 150 MG tablet, Take 150 mg by mouth daily as needed for heartburn., Disp: , Rfl:  .  rivaroxaban (XARELTO) 20 MG TABS tablet, Take  1 tablet (20 mg total) by mouth daily with supper., Disp: 30 tablet, Rfl: 6 .  Omega-3 Fatty Acids (FISH OIL) 1000 MG CAPS, Take 1 capsule by mouth daily. Reported on 09/29/2015, Disp: , Rfl:   Past Medical History: Past Medical History  Diagnosis Date  . Hyperlipidemia   . Chronic atrial fibrillation (HCC)     a. CHA2DS2VASc = 2 -->Xarelto started 08/2015.  . Nonspecific elevation of levels of transaminase or lactic acid dehydrogenase (LDH)     PMH of ; ? due to Amiodarone   . Diverticulosis   . Anemia   . Depression   . Arthritis     hands, lower back  . Heart murmur   . Cataracts, bilateral     surgery  . GERD (gastroesophageal reflux disease)   . Hx of adenomatous colonic polyps 08/13/2015  . Pneumonia, bacterial 05/18/2012  . CAD (coronary artery disease), native coronary artery     a. 08/2015 Cath/PCI: LM nl, LAD 79m, LCX small, nl, RCA 80p (4.0x16 Synergy DES),   . Hilar adenopathy     a. 08/2015 CT Chest: mild bilat hilar adenopathy and mild soft tissue prominence in inf right hilum, left hilum. Also areas of soft tissue and low-density material filling LLL bronchial airways->? mucous vs mass.  . Pulmonary nodules     a. 08/2015 CT Chest:  reticulonodular opacities in RUL - largest 63mm - likely 2/2 inflammatory process.    Tobacco Use: History  Smoking status  . Never Smoker   Smokeless tobacco  . Former Systems developer  . Types: Chew    Comment: occasional cigar in past; not rgular smoker    Labs: Recent Review Flowsheet Data    Labs for ITP Cardiac and Pulmonary Rehab Latest Ref Rng 12/02/2008 11/22/2011 05/14/2014 01/22/2015 09/08/2015   Cholestrol 0 - 200 mg/dL 166 202(H) 194 188 146   LDLCALC 0 - 99 mg/dL 96 - 123(H) 114(H) 78   LDLDIRECT - - 102.4 - - -   HDL >40 mg/dL 61.60 76.70 59.90 61.60 61   Trlycerides <150 mg/dL 43.0 65.0 57.0 62.0 36   Hemoglobin A1c 4.8 - 5.6 % - - - - 6.0(H)      Capillary Blood Glucose: No results found for: GLUCAP   Exercise Target  Goals: Date: 09/29/15  Exercise Program Goal: Individual exercise prescription set with THRR, safety & activity barriers. Participant demonstrates ability to understand and report RPE using BORG scale, to self-measure pulse accurately, and to acknowledge the importance of the exercise prescription.  Exercise Prescription Goal: Starting with aerobic activity 30 plus minutes a day, 3 days per week for initial exercise prescription. Provide home exercise prescription and guidelines that participant acknowledges understanding prior to discharge.  Activity Barriers & Risk Stratification:     Activity Barriers & Cardiac Risk Stratification - 09/29/15 1440    Activity Barriers & Cardiac Risk Stratification   Activity Barriers None   Cardiac Risk Stratification High      6 Minute Walk:     6 Minute Walk      09/29/15 1649       6 Minute Walk   Phase Initial     Distance 2374 feet     Walk Time 6 minutes     # of Rest Breaks 0     MPH 4.5     METS 5.6     RPE 12     VO2 Peak 19.6     Symptoms No     Resting HR 60 bpm     Resting BP 115/70 mmHg     Max Ex. HR 131 bpm     Max Ex. BP 128/78 mmHg     2 Minute Post BP 114/64 mmHg        Initial Exercise Prescription:     Initial Exercise Prescription - 09/29/15 1600    Date of Initial Exercise RX and Referring Provider   Date 09/29/15   Referring Provider Fransico Him, MD   Treadmill   MPH 3.2   Grade 3   Minutes 10   METs 4.77   Bike   Level 1.9   Minutes 10   METs 5.63   NuStep   Level 3   Minutes 10   METs 3   Prescription Details   Frequency (times per week) 3   Duration Progress to 30 minutes of continuous aerobic without signs/symptoms of physical distress   Intensity   THRR 40-80% of Max Heartrate 75-120 bpm   Ratings of Perceived Exertion 11-13   Progression   Progression Continue to progress workloads to maintain intensity without signs/symptoms of physical distress.   Resistance Training    Training Prescription Yes   Weight 2-3 lbs   Reps 10-12      Perform Capillary Blood Glucose checks as needed.  Exercise Prescription Changes:   Exercise Comments:  Discharge Exercise Prescription (Final Exercise Prescription Changes):   Nutrition:  Target Goals: Understanding of nutrition guidelines, daily intake of sodium 1500mg , cholesterol 200mg , calories 30% from fat and 7% or less from saturated fats, daily to have 5 or more servings of fruits and vegetables.  Biometrics:     Pre Biometrics - 09/29/15 1652    Pre Biometrics   Height 6' 2.5" (1.892 m)   Weight 168 lb 3.4 oz (76.3 kg)   Waist Circumference 33.5 inches   Hip Circumference 40 inches   Waist to Hip Ratio 0.84 %   BMI (Calculated) 21.4   Triceps Skinfold 15 mm   % Body Fat 21.8 %   Grip Strength 44 kg   Flexibility 10 in   Single Leg Stand 30 seconds       Nutrition Therapy Plan and Nutrition Goals:   Nutrition Discharge: Nutrition Scores:   Nutrition Goals Re-Evaluation:   Psychosocial: Target Goals: Acknowledge presence or absence of depression, maximize coping skills, provide positive support system. Participant is able to verbalize types and ability to use techniques and skills needed for reducing stress and depression.  Initial Review & Psychosocial Screening:   Quality of Life Scores:     Quality of Life - 09/29/15 1654    Quality of Life Scores   Health/Function Pre 23.07 %   Socioeconomic Pre 24.79 %   Psych/Spiritual Pre 24.75 %   Family Pre 24.35 %   GLOBAL Pre 23.98 %      PHQ-9:     Recent Review Flowsheet Data    Depression screen [facility] 2/9 01/22/2015 05/14/2014   Decreased Interest 0 0   Down, Depressed, Hopeless 0 0   PHQ - 2 Score 0 0      Psychosocial Evaluation and Intervention:   Psychosocial Re-Evaluation:   Vocational Rehabilitation: Provide vocational rehab assistance to qualifying candidates.   Vocational Rehab Evaluation & Intervention:      Vocational Rehab - 09/29/15 1359    Initial Vocational Rehab Evaluation & Intervention   Assessment shows need for Vocational Rehabilitation No      Education: Education Goals: Education classes will be provided on a weekly basis, covering required topics. Participant will state understanding/return demonstration of topics presented.  Learning Barriers/Preferences:     Learning Barriers/Preferences - 09/29/15 1441    Learning Barriers/Preferences   Learning Barriers None   Learning Preferences Skilled Demonstration      Education Topics: Count Your Pulse:  -Group instruction provided by verbal instruction, demonstration, patient participation and written materials to support subject.  Instructors address importance of being able to find your pulse and how to count your pulse when at home without a heart monitor.  Patients get hands on experience counting their pulse with staff help and individually.   Heart Attack, Angina, and Risk Factor Modification:  -Group instruction provided by verbal instruction, video, and written materials to support subject.  Instructors address signs and symptoms of angina and heart attacks.    Also discuss risk factors for heart disease and how to make changes to improve heart health risk factors.   Functional Fitness:  -Group instruction provided by verbal instruction, demonstration, patient participation, and written materials to support subject.  Instructors address safety measures for doing things around the house.  Discuss how to get up and down off the floor, how to pick things up properly, how to safely get out of a chair without assistance, and balance training.   Meditation and Mindfulness:  -Group instruction  provided by verbal instruction, patient participation, and written materials to support subject.  Instructor addresses importance of mindfulness and meditation practice to help reduce stress and improve awareness.  Instructor also  leads participants through a meditation exercise.    Stretching for Flexibility and Mobility:  -Group instruction provided by verbal instruction, patient participation, and written materials to support subject.  Instructors lead participants through series of stretches that are designed to increase flexibility thus improving mobility.  These stretches are additional exercise for major muscle groups that are typically performed during regular warm up and cool down.   Hands Only CPR Anytime:  -Group instruction provided by verbal instruction, video, patient participation and written materials to support subject.  Instructors co-teach with AHA video for hands only CPR.  Participants get hands on experience with mannequins.   Nutrition I class: Heart Healthy Eating:  -Group instruction provided by PowerPoint slides, verbal discussion, and written materials to support subject matter. The instructor gives an explanation and review of the Therapeutic Lifestyle Changes diet recommendations, which includes a discussion on lipid goals, dietary fat, sodium, fiber, plant stanol/sterol esters, sugar, and the components of a well-balanced, healthy diet.   Nutrition II class: Lifestyle Skills:  -Group instruction provided by PowerPoint slides, verbal discussion, and written materials to support subject matter. The instructor gives an explanation and review of label reading, grocery shopping for heart health, heart healthy recipe modifications, and ways to make healthier choices when eating out.   Diabetes Question & Answer:  -Group instruction provided by PowerPoint slides, verbal discussion, and written materials to support subject matter. The instructor gives an explanation and review of diabetes co-morbidities, pre- and post-prandial blood glucose goals, pre-exercise blood glucose goals, signs, symptoms, and treatment of hypoglycemia and hyperglycemia, and foot care basics.   Diabetes Blitz:  -Group  instruction provided by PowerPoint slides, verbal discussion, and written materials to support subject matter. The instructor gives an explanation and review of the physiology behind type 1 and type 2 diabetes, diabetes medications and rational behind using different medications, pre- and post-prandial blood glucose recommendations and Hemoglobin A1c goals, diabetes diet, and exercise including blood glucose guidelines for exercising safely.    Portion Distortion:  -Group instruction provided by PowerPoint slides, verbal discussion, written materials, and food models to support subject matter. The instructor gives an explanation of serving size versus portion size, changes in portions sizes over the last 20 years, and what consists of a serving from each food group.   Stress Management:  -Group instruction provided by verbal instruction, video, and written materials to support subject matter.  Instructors review role of stress in heart disease and how to cope with stress positively.     Exercising on Your Own:  -Group instruction provided by verbal instruction, power point, and written materials to support subject.  Instructors discuss benefits of exercise, components of exercise, frequency and intensity of exercise, and end points for exercise.  Also discuss use of nitroglycerin and activating EMS.  Review options of places to exercise outside of rehab.  Review guidelines for sex with heart disease.   Cardiac Drugs I:  -Group instruction provided by verbal instruction and written materials to support subject.  Instructor reviews cardiac drug classes: antiplatelets, anticoagulants, beta blockers, and statins.  Instructor discusses reasons, side effects, and lifestyle considerations for each drug class.   Cardiac Drugs II:  -Group instruction provided by verbal instruction and written materials to support subject.  Instructor reviews cardiac drug classes: angiotensin converting enzyme inhibitors  (  ACE-I), angiotensin II receptor blockers (ARBs), nitrates, and calcium channel blockers.  Instructor discusses reasons, side effects, and lifestyle considerations for each drug class.   Anatomy and Physiology of the Circulatory System:  -Group instruction provided by verbal instruction, video, and written materials to support subject.  Reviews functional anatomy of heart, how it relates to various diagnoses, and what role the heart plays in the overall system.   Knowledge Questionnaire Score:     Knowledge Questionnaire Score - 09/30/15 0806    Knowledge Questionnaire Score   Pre Score 22/24      Core Components/Risk Factors/Patient Goals at Admission:     Personal Goals and Risk Factors at Admission - 09/29/15 1505    Core Components/Risk Factors/Patient Goals on Admission   Lipids Yes   Intervention Provide education and support for participant on nutrition & aerobic/resistive exercise along with prescribed medications to achieve LDL 70mg , HDL >40mg .   Expected Outcomes Short Term: Participant states understanding of desired cholesterol values and is compliant with medications prescribed. Participant is following exercise prescription and nutrition guidelines.;Long Term: Cholesterol controlled with medications as prescribed, with individualized exercise RX and with personalized nutrition plan. Value goals: LDL < 70mg , HDL > 40 mg.      Core Components/Risk Factors/Patient Goals Review:    Core Components/Risk Factors/Patient Goals at Discharge (Final Review):    ITP Comments:     ITP Comments      09/29/15 1354           ITP Comments Dr. Fransico Him, Medical Director           Comments: Patient attended orientation from 1330 to 1530 to review rules and guidelines for program. Completed 6 minute walk test, Intitial ITP, and exercise prescription.  VSS. Telemetry-atrial fibrillation, rare PVC.  Asymptomatic.

## 2015-10-01 ENCOUNTER — Encounter (HOSPITAL_COMMUNITY): Payer: Medicare Other

## 2015-10-02 ENCOUNTER — Ambulatory Visit (HOSPITAL_COMMUNITY)
Admission: RE | Admit: 2015-10-02 | Discharge: 2015-10-02 | Disposition: A | Discharge status: Home / Self Care | Payer: Medicare Other | Source: Ambulatory Visit | Attending: Internal Medicine | Admitting: Internal Medicine

## 2015-10-02 ENCOUNTER — Telehealth: Payer: Self-pay | Admitting: Internal Medicine

## 2015-10-02 DIAGNOSIS — R634 Abnormal weight loss: Secondary | ICD-10-CM | POA: Diagnosis not present

## 2015-10-02 DIAGNOSIS — R05 Cough: Secondary | ICD-10-CM | POA: Diagnosis not present

## 2015-10-02 DIAGNOSIS — R053 Chronic cough: Secondary | ICD-10-CM

## 2015-10-02 DIAGNOSIS — J387 Other diseases of larynx: Secondary | ICD-10-CM | POA: Insufficient documentation

## 2015-10-02 DIAGNOSIS — R5383 Other fatigue: Secondary | ICD-10-CM | POA: Insufficient documentation

## 2015-10-02 DIAGNOSIS — R490 Dysphonia: Secondary | ICD-10-CM | POA: Diagnosis not present

## 2015-10-02 DIAGNOSIS — R0982 Postnasal drip: Secondary | ICD-10-CM | POA: Insufficient documentation

## 2015-10-02 LAB — PULMONARY FUNCTION TEST
DL/VA % pred: 56 %
DL/VA: 2.73 ml/min/mmHg/L
DLCO UNC % PRED: 40 %
DLCO UNC: 15.95 ml/min/mmHg
FEF 25-75 PRE: 2.04 L/s
FEF 25-75 Post: 2.17 L/sec
FEF2575-%CHANGE-POST: 6 %
FEF2575-%PRED-PRE: 70 %
FEF2575-%Pred-Post: 74 %
FEV1-%Change-Post: 0 %
FEV1-%PRED-POST: 79 %
FEV1-%Pred-Pre: 79 %
FEV1-POST: 3.09 L
FEV1-Pre: 3.07 L
FEV1FVC-%CHANGE-POST: 0 %
FEV1FVC-%Pred-Pre: 98 %
FEV6-%CHANGE-POST: 0 %
FEV6-%PRED-PRE: 84 %
FEV6-%Pred-Post: 83 %
FEV6-POST: 4.18 L
FEV6-PRE: 4.19 L
FEV6FVC-%Change-Post: 0 %
FEV6FVC-%PRED-POST: 103 %
FEV6FVC-%Pred-Pre: 103 %
FVC-%Change-Post: 0 %
FVC-%PRED-POST: 80 %
FVC-%PRED-PRE: 80 %
FVC-Post: 4.26 L
FVC-Pre: 4.25 L
POST FEV6/FVC RATIO: 98 %
PRE FEV1/FVC RATIO: 72 %
Post FEV1/FVC ratio: 73 %
Pre FEV6/FVC Ratio: 99 %
RV % PRED: 79 %
RV: 2.19 L
TLC % pred: 77 %
TLC: 6.2 L

## 2015-10-02 MED ORDER — ALBUTEROL SULFATE (2.5 MG/3ML) 0.083% IN NEBU
2.5000 mg | INHALATION_SOLUTION | Freq: Once | RESPIRATORY_TRACT | Status: AC
  Administered 2015-10-02: 2.5 mg via RESPIRATORY_TRACT

## 2015-10-02 NOTE — Telephone Encounter (Signed)
pft shows isolated reduction in diffusion 40% (inefficiency with o2 transfer_. Will await CT to see if there is a reason for this. I am out of town and can get back to him 10/09/15 (CT is on 5.22/17)

## 2015-10-05 ENCOUNTER — Ambulatory Visit (INDEPENDENT_AMBULATORY_CARE_PROVIDER_SITE_OTHER)
Admission: RE | Admit: 2015-10-05 | Discharge: 2015-10-05 | Disposition: A | Discharge status: Home / Self Care | Payer: Medicare Other | Source: Ambulatory Visit | Attending: Internal Medicine | Admitting: Internal Medicine

## 2015-10-05 ENCOUNTER — Encounter (HOSPITAL_COMMUNITY)
Admission: RE | Admit: 2015-10-05 | Discharge: 2015-10-05 | Disposition: A | Discharge status: Home / Self Care | Payer: Medicare Other | Source: Ambulatory Visit | Attending: Cardiology | Admitting: Cardiology

## 2015-10-05 DIAGNOSIS — R0982 Postnasal drip: Secondary | ICD-10-CM | POA: Diagnosis not present

## 2015-10-05 DIAGNOSIS — R490 Dysphonia: Secondary | ICD-10-CM

## 2015-10-05 DIAGNOSIS — J387 Other diseases of larynx: Secondary | ICD-10-CM

## 2015-10-05 DIAGNOSIS — Z955 Presence of coronary angioplasty implant and graft: Secondary | ICD-10-CM

## 2015-10-05 DIAGNOSIS — R05 Cough: Secondary | ICD-10-CM

## 2015-10-05 DIAGNOSIS — R053 Chronic cough: Secondary | ICD-10-CM

## 2015-10-05 DIAGNOSIS — R5383 Other fatigue: Secondary | ICD-10-CM

## 2015-10-05 DIAGNOSIS — R634 Abnormal weight loss: Secondary | ICD-10-CM

## 2015-10-05 NOTE — Progress Notes (Signed)
Daily Session Note  Patient Details  Name: Troy Willis MRN: [id number] Date of Birth: Dec 11, 1945 Referring Provider:        CARDIAC REHAB PHASE II ORIENTATION from 09/29/2015 in [place]   Referring Provider  Fransico Him, MD      Encounter Date: 10/05/2015  Check In:     Session Check In - 10/05/15 0720    Check-In   Location [facility]   Staff Present Maurice Small, RN, BSN;Molly diVincenzo, MS, ACSM RCEP, Exercise Physiologist;Amber Fair, MS, ACSM RCEP, Exercise Physiologist;Portia Rollene Rotunda, RN, BSN   Supervising physician immediately available to respond to emergencies [facility] immediately available   Physician(s) Dr. Marily Memos    Medication changes reported     No   Fall or balance concerns reported    No   Warm-up and Cool-down Performed as group-led instruction   Resistance Training Performed Yes   VAD Patient? No   Pain Assessment   Currently in Pain? No/denies   Multiple Pain Sites No      Capillary Blood Glucose: No results found for this or any previous visit (from the past 24 hour(s)).   Goals Met:  Exercise tolerated well Personal goals reviewed  Goals Unmet:  Not Applicable  Comments:  Pt in today for his first day of full exercise in cardiac rehab 6:45 phase II program.  Pt tolerated light exercise without difficulty. VSS, telemetry-afib which is chronic for this pt. Pt takes Xeralto and is compliant with this, asymptomatic.  Medication list reconciled. Pt denies barriers to medicaiton compliance. Pt will be finished with his aspirin for 30 days on 5/25.  PSYCHOSOCIAL ASSESSMENT:  PHQ-0. Pt exhibits positive coping skills, hopeful outlook with supportive family. No psychosocial needs identified at this time, no psychosocial interventions necessary.    Pt enjoys boating, spending time with family and target practice at the shooting range. Pt short term goal includes returning back to the gym.   Pt about a year prior to his cardiac event he exercised at the gym using the eliptical and treadmill.  Pt presently is walking a mile a day. Encourage pt to continue with walking until home exercise is reviewed.  Pt long term goal graduate in 12 weeks or less and "die healthy".  Pt defines this as falling asleep and not waking back up. Pt does not want to be chronically ill prior to his death. Pt oriented to exercise equipment and routine.    Understanding verbalized. Maurice Small RN, BSN    Dr. Fransico Him is Medical Director for Cardiac Rehab at [facility].

## 2015-10-07 ENCOUNTER — Encounter (HOSPITAL_COMMUNITY)
Admission: RE | Admit: 2015-10-07 | Discharge: 2015-10-07 | Disposition: A | Discharge status: Home / Self Care | Payer: Medicare Other | Source: Ambulatory Visit | Attending: Cardiology | Admitting: Cardiology

## 2015-10-07 DIAGNOSIS — Z955 Presence of coronary angioplasty implant and graft: Secondary | ICD-10-CM

## 2015-10-07 NOTE — Progress Notes (Signed)
Cardiac Individual Treatment Plan  Patient Details  Name: Troy Willis MRN: [id number] Date of Birth: January 23, 1946 Referring Provider:        CARDIAC REHAB PHASE II ORIENTATION from 09/29/2015 in [place]   Referring Provider  Fransico Him, MD      Initial Encounter Date:       CARDIAC REHAB PHASE II ORIENTATION from 09/29/2015 in [place]   Date  09/29/15   Referring Provider  Fransico Him, MD      Visit Diagnosis: Stented coronary artery  Patient's Home Medications on Admission:  Current outpatient prescriptions:  .  albuterol (PROVENTIL HFA;VENTOLIN HFA) 108 (90 Base) MCG/ACT inhaler, Inhale 2 puffs into the lungs every 4 (four) hours as needed for wheezing or shortness of breath. (Patient not taking: Reported on 10/05/2015), Disp: 1 Inhaler, Rfl: 0 .  aspirin 81 MG tablet, Take 1 tablet (81 mg total) by mouth daily. X 30 days, Disp: , Rfl:  .  atorvastatin (LIPITOR) 80 MG tablet, Take 1 tablet (80 mg total) by mouth daily at 6 PM., Disp: 30 tablet, Rfl: 6 .  Biotin 5000 MCG CAPS, Take by mouth daily., Disp: , Rfl:  .  celecoxib (CELEBREX) 50 MG capsule, Take 50 mg by mouth daily as needed for pain (Hand inflammation)., Disp: , Rfl:  .  Cholecalciferol (VITAMIN D3) 2000 UNITS TABS, Take by mouth daily., Disp: , Rfl:  .  clopidogrel (PLAVIX) 75 MG tablet, Take 1 tablet (75 mg total) by mouth daily with breakfast., Disp: 30 tablet, Rfl: 6 .  HYDROcodone-GuaiFENesin 2.5-200 MG/5ML SOLN, Take 5 mLs by mouth at bedtime as needed. Reported on 10/05/2015, Disp: , Rfl:  .  Multiple Vitamin (MULTIVITAMIN) tablet, Take 1 tablet by mouth daily., Disp: , Rfl:  .  nitroGLYCERIN (NITROSTAT) 0.4 MG SL tablet, Place 1 tablet (0.4 mg total) under the tongue every 5 (five) minutes x 3 doses as needed for chest pain., Disp: 25 tablet, Rfl: 3 .  Omega-3 Fatty Acids (FISH OIL) 1000 MG CAPS, Take 1 capsule by mouth daily. Reported  on 10/05/2015, Disp: , Rfl:  .  ranitidine (ZANTAC) 150 MG tablet, Take 150 mg by mouth daily as needed for heartburn., Disp: , Rfl:  .  rivaroxaban (XARELTO) 20 MG TABS tablet, Take 1 tablet (20 mg total) by mouth daily with supper., Disp: 30 tablet, Rfl: 6  Past Medical History: Past Medical History  Diagnosis Date  . Hyperlipidemia   . Chronic atrial fibrillation (HCC)     a. CHA2DS2VASc = 2 -->Xarelto started 08/2015.  . Nonspecific elevation of levels of transaminase or lactic acid dehydrogenase (LDH)     PMH of ; ? due to Amiodarone   . Diverticulosis   . Anemia   . Depression   . Arthritis     hands, lower back  . Heart murmur   . Cataracts, bilateral     surgery  . GERD (gastroesophageal reflux disease)   . Hx of adenomatous colonic polyps 08/13/2015  . Pneumonia, bacterial 05/18/2012  . CAD (coronary artery disease), native coronary artery     a. 08/2015 Cath/PCI: LM nl, LAD 60m, LCX small, nl, RCA 80p (4.0x16 Synergy DES),   . Hilar adenopathy     a. 08/2015 CT Chest: mild bilat hilar adenopathy and mild soft tissue prominence in inf right hilum, left hilum. Also areas of soft tissue and low-density material filling LLL bronchial airways->? mucous vs mass.  . Pulmonary  nodules     a. 08/2015 CT Chest: reticulonodular opacities in RUL - largest 26mm - likely 2/2 inflammatory process.    Tobacco Use: History  Smoking status  . Never Smoker   Smokeless tobacco  . Former Systems developer  . Types: Chew    Comment: occasional cigar in past; not rgular smoker    Labs: Recent Review Flowsheet Data    Labs for ITP Cardiac and Pulmonary Rehab Latest Ref Rng 12/02/2008 11/22/2011 05/14/2014 01/22/2015 09/08/2015   Cholestrol 0 - 200 mg/dL 166 202(H) 194 188 146   LDLCALC 0 - 99 mg/dL 96 - 123(H) 114(H) 78   LDLDIRECT - - 102.4 - - -   HDL >40 mg/dL 61.60 76.70 59.90 61.60 61   Trlycerides <150 mg/dL 43.0 65.0 57.0 62.0 36   Hemoglobin A1c 4.8 - 5.6 % - - - - 6.0(H)      Capillary Blood  Glucose: No results found for: GLUCAP   Exercise Target Goals:    Exercise Program Goal: Individual exercise prescription set with THRR, safety & activity barriers. Participant demonstrates ability to understand and report RPE using BORG scale, to self-measure pulse accurately, and to acknowledge the importance of the exercise prescription.  Exercise Prescription Goal: Starting with aerobic activity 30 plus minutes a day, 3 days per week for initial exercise prescription. Provide home exercise prescription and guidelines that participant acknowledges understanding prior to discharge.  Activity Barriers & Risk Stratification:     Activity Barriers & Cardiac Risk Stratification - 09/29/15 1440    Activity Barriers & Cardiac Risk Stratification   Activity Barriers None   Cardiac Risk Stratification High      6 Minute Walk:     6 Minute Walk      09/29/15 1649       6 Minute Walk   Phase Initial     Distance 2374 feet     Walk Time 6 minutes     # of Rest Breaks 0     MPH 4.5     METS 5.6     RPE 12     VO2 Peak 19.6     Symptoms No     Resting HR 60 bpm     Resting BP 115/70 mmHg     Max Ex. HR 131 bpm     Max Ex. BP 128/78 mmHg     2 Minute Post BP 114/64 mmHg        Initial Exercise Prescription:     Initial Exercise Prescription - 09/29/15 1600    Date of Initial Exercise RX and Referring Provider   Date 09/29/15   Referring Provider Fransico Him, MD   Treadmill   MPH 3.2   Grade 3   Minutes 10   METs 4.77   Bike   Level 1.9   Minutes 10   METs 5.63   NuStep   Level 3   Minutes 10   METs 3   Prescription Details   Frequency (times per week) 3   Duration Progress to 30 minutes of continuous aerobic without signs/symptoms of physical distress   Intensity   THRR 40-80% of Max Heartrate 75-120 bpm   Ratings of Perceived Exertion 11-13   Progression   Progression Continue to progress workloads to maintain intensity without signs/symptoms of  physical distress.   Resistance Training   Training Prescription Yes   Weight 2-3 lbs   Reps 10-12      Perform Capillary Blood Glucose checks as needed.  Exercise Prescription Changes:   Exercise Comments:   Discharge Exercise Prescription (Final Exercise Prescription Changes):   Nutrition:  Target Goals: Understanding of nutrition guidelines, daily intake of sodium 1500mg , cholesterol 200mg , calories 30% from fat and 7% or less from saturated fats, daily to have 5 or more servings of fruits and vegetables.  Biometrics:     Pre Biometrics - 09/29/15 1652    Pre Biometrics   Height 6' 2.5" (1.892 m)   Weight 168 lb 3.4 oz (76.3 kg)   Waist Circumference 33.5 inches   Hip Circumference 40 inches   Waist to Hip Ratio 0.84 %   BMI (Calculated) 21.4   Triceps Skinfold 15 mm   % Body Fat 21.8 %   Grip Strength 44 kg   Flexibility 10 in   Single Leg Stand 30 seconds       Nutrition Therapy Plan and Nutrition Goals:     Nutrition Therapy & Goals - 09/30/15 0834    Nutrition Therapy   Diet Therapeutic Lifestyle Changes   Personal Nutrition Goals   Personal Goal #1 Pt to maintain UBW range of 167-171 lb while in [place], educate and counsel regarding individualized specific dietary modifications aiming towards targeted core components such as weight, hypertension, lipid management, diabetes, heart failure and other comorbidities.   Expected Outcomes Short Term Goal: Understand basic principles of dietary content, such as calories, fat, sodium, cholesterol and nutrients.;Long Term Goal: Adherence to prescribed nutrition plan.      Nutrition Discharge: Nutrition Scores:     Nutrition Assessments - 09/30/15 0833    MEDFICTS Scores   Pre Score 34      Nutrition Goals Re-Evaluation:   Psychosocial: Target Goals: Acknowledge presence or absence of depression, maximize coping skills, provide positive support  system. Participant is able to verbalize types and ability to use techniques and skills needed for reducing stress and depression.  Initial Review & Psychosocial Screening:   Quality of Life Scores:     Quality of Life - 09/29/15 1654    Quality of Life Scores   Health/Function Pre 23.07 %   Socioeconomic Pre 24.79 %   Psych/Spiritual Pre 24.75 %   Family Pre 24.35 %   GLOBAL Pre 23.98 %      PHQ-9:     Recent Review Flowsheet Data    Depression screen [facility] 2/9 10/05/2015 01/22/2015 05/14/2014   Decreased Interest 0 0 0   Down, Depressed, Hopeless 0 0 0   PHQ - 2 Score 0 0 0      Psychosocial Evaluation and Intervention:   Psychosocial Re-Evaluation:   Vocational Rehabilitation: Provide vocational rehab assistance to qualifying candidates.   Vocational Rehab Evaluation & Intervention:     Vocational Rehab - 09/29/15 1359    Initial Vocational Rehab Evaluation & Intervention   Assessment shows need for Vocational Rehabilitation No      Education: Education Goals: Education classes will be provided on a weekly basis, covering required topics. Participant will state understanding/return demonstration of topics presented.  Learning Barriers/Preferences:     Learning Barriers/Preferences - 09/29/15 1441    Learning Barriers/Preferences   Learning Barriers None   Learning Preferences Skilled Demonstration      Education Topics: Count Your Pulse:  -Group instruction provided by verbal instruction, demonstration, patient participation and written materials to support subject.  Instructors address importance of being able to find your pulse and how to count your pulse when at  home without a heart monitor.  Patients get hands on experience counting their pulse with staff help and individually.   Heart Attack, Angina, and Risk Factor Modification:  -Group instruction provided by verbal instruction, video, and written materials to support subject.  Instructors  address signs and symptoms of angina and heart attacks.    Also discuss risk factors for heart disease and how to make changes to improve heart health risk factors.   Functional Fitness:  -Group instruction provided by verbal instruction, demonstration, patient participation, and written materials to support subject.  Instructors address safety measures for doing things around the house.  Discuss how to get up and down off the floor, how to pick things up properly, how to safely get out of a chair without assistance, and balance training.   Meditation and Mindfulness:  -Group instruction provided by verbal instruction, patient participation, and written materials to support subject.  Instructor addresses importance of mindfulness and meditation practice to help reduce stress and improve awareness.  Instructor also leads participants through a meditation exercise.           CARDIAC REHAB PHASE II EXERCISE from 10/07/2015 in [place]   Date  10/07/15   Educator  Jeanella Craze   Instruction Review Code  2- meets goals/outcomes      Stretching for Flexibility and Mobility:  -Group instruction provided by verbal instruction, patient participation, and written materials to support subject.  Instructors lead participants through series of stretches that are designed to increase flexibility thus improving mobility.  These stretches are additional exercise for major muscle groups that are typically performed during regular warm up and cool down.   Hands Only CPR Anytime:  -Group instruction provided by verbal instruction, video, patient participation and written materials to support subject.  Instructors co-teach with AHA video for hands only CPR.  Participants get hands on experience with mannequins.   Nutrition I class: Heart Healthy Eating:  -Group instruction provided by PowerPoint slides, verbal discussion, and written materials to support subject matter. The  instructor gives an explanation and review of the Therapeutic Lifestyle Changes diet recommendations, which includes a discussion on lipid goals, dietary fat, sodium, fiber, plant stanol/sterol esters, sugar, and the components of a well-balanced, healthy diet.   Nutrition II class: Lifestyle Skills:  -Group instruction provided by PowerPoint slides, verbal discussion, and written materials to support subject matter. The instructor gives an explanation and review of label reading, grocery shopping for heart health, heart healthy recipe modifications, and ways to make healthier choices when eating out.   Diabetes Question & Answer:  -Group instruction provided by PowerPoint slides, verbal discussion, and written materials to support subject matter. The instructor gives an explanation and review of diabetes co-morbidities, pre- and post-prandial blood glucose goals, pre-exercise blood glucose goals, signs, symptoms, and treatment of hypoglycemia and hyperglycemia, and foot care basics.   Diabetes Blitz:  -Group instruction provided by PowerPoint slides, verbal discussion, and written materials to support subject matter. The instructor gives an explanation and review of the physiology behind type 1 and type 2 diabetes, diabetes medications and rational behind using different medications, pre- and post-prandial blood glucose recommendations and Hemoglobin A1c goals, diabetes diet, and exercise including blood glucose guidelines for exercising safely.    Portion Distortion:  -Group instruction provided by PowerPoint slides, verbal discussion, written materials, and food models to support subject matter. The instructor gives an explanation of serving size versus portion size, changes in portions sizes over  the last 20 years, and what consists of a serving from each food group.   Stress Management:  -Group instruction provided by verbal instruction, video, and written materials to support subject  matter.  Instructors review role of stress in heart disease and how to cope with stress positively.     Exercising on Your Own:  -Group instruction provided by verbal instruction, power point, and written materials to support subject.  Instructors discuss benefits of exercise, components of exercise, frequency and intensity of exercise, and end points for exercise.  Also discuss use of nitroglycerin and activating EMS.  Review options of places to exercise outside of rehab.  Review guidelines for sex with heart disease.   Cardiac Drugs I:  -Group instruction provided by verbal instruction and written materials to support subject.  Instructor reviews cardiac drug classes: antiplatelets, anticoagulants, beta blockers, and statins.  Instructor discusses reasons, side effects, and lifestyle considerations for each drug class.   Cardiac Drugs II:  -Group instruction provided by verbal instruction and written materials to support subject.  Instructor reviews cardiac drug classes: angiotensin converting enzyme inhibitors (ACE-I), angiotensin II receptor blockers (ARBs), nitrates, and calcium channel blockers.  Instructor discusses reasons, side effects, and lifestyle considerations for each drug class.   Anatomy and Physiology of the Circulatory System:  -Group instruction provided by verbal instruction, video, and written materials to support subject.  Reviews functional anatomy of heart, how it relates to various diagnoses, and what role the heart plays in the overall system.   Knowledge Questionnaire Score:     Knowledge Questionnaire Score - 09/30/15 0806    Knowledge Questionnaire Score   Pre Score 22/24      Core Components/Risk Factors/Patient Goals at Admission:     Personal Goals and Risk Factors at Admission - 09/29/15 1505    Core Components/Risk Factors/Patient Goals on Admission   Lipids Yes   Intervention Provide education and support for participant on nutrition &  aerobic/resistive exercise along with prescribed medications to achieve LDL 70mg , HDL >40mg .   Expected Outcomes Short Term: Participant states understanding of desired cholesterol values and is compliant with medications prescribed. Participant is following exercise prescription and nutrition guidelines.;Long Term: Cholesterol controlled with medications as prescribed, with individualized exercise RX and with personalized nutrition plan. Value goals: LDL < 70mg , HDL > 40 mg.      Core Components/Risk Factors/Patient Goals Review:    Core Components/Risk Factors/Patient Goals at Discharge (Final Review):    ITP Comments:     ITP Comments      09/29/15 1354           ITP Comments Dr. Fransico Him, Medical Director           Comments:  Pt is making expected progress toward personal goals after completing 3 sessions. Recommend continued exercise and life style modification education including  stress management and relaxation techniques to decrease cardiac risk profile. Cherre Huger, BSN

## 2015-10-08 ENCOUNTER — Telehealth: Payer: Self-pay | Admitting: Internal Medicine

## 2015-10-08 NOTE — Telephone Encounter (Signed)
MR, pt is requesting results of PFTs, CT Chest, and Ct Sinus.  Pt is requesting to know the "extent" of what is going on.  Please advise.  Thank you.

## 2015-10-08 NOTE — Telephone Encounter (Signed)
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 Am waiting to hear Dr Rosario Jacks of radiology on her furhter thoughs

## 2015-10-08 NOTE — Telephone Encounter (Signed)
Spoke with pt.  Discussed below per MR.  Pt verbalized understanding. Will route back to MR as requested.

## 2015-10-08 NOTE — Telephone Encounter (Signed)
Let him know  CT sinus normal  CT chest - there is lung inflammation and some of it is better than April 2017. PFT reflects the CT chest. I am askign for futher info from radiologist. I emailed her early this AM but no new yet.   I can call Anjelo Amani, 10/09/15 when I will have more time for a more indepth phone call and I have time when I am doing paper work  Pls send message back to me  THanks  Dr. Brand Males, M.D., [facility].C.P Pulmonary and Critical Care Medicine Staff Physician [place] [facility] Pager: [phone], If no answer or between  15:00h - 7:00h: call [phone]  10/08/2015 3:25 PM

## 2015-10-09 ENCOUNTER — Telehealth: Payer: Self-pay | Admitting: Internal Medicine

## 2015-10-09 ENCOUNTER — Encounter (HOSPITAL_COMMUNITY)
Admission: RE | Admit: 2015-10-09 | Discharge: 2015-10-09 | Disposition: A | Discharge status: Home / Self Care | Payer: Medicare Other | Source: Ambulatory Visit | Attending: Cardiology | Admitting: Cardiology

## 2015-10-09 DIAGNOSIS — Z955 Presence of coronary angioplasty implant and graft: Secondary | ICD-10-CM

## 2015-10-09 DIAGNOSIS — R059 Cough, unspecified: Secondary | ICD-10-CM

## 2015-10-09 DIAGNOSIS — R490 Dysphonia: Secondary | ICD-10-CM

## 2015-10-09 DIAGNOSIS — R05 Cough: Secondary | ICD-10-CM

## 2015-10-09 NOTE — Progress Notes (Signed)
Daily Session Note  Patient Details  Name: Troy Willis MRN: [id number] Date of Birth: 02-Jan-1946 Referring Provider:        CARDIAC REHAB PHASE II ORIENTATION from 09/29/2015 in [place]   Referring Provider  Fransico Him, MD      Encounter Date: 10/09/2015  Check In:     Session Check In - 10/09/15 0707    Check-In   Location [facility]   Staff Present Maurice Small, RN, BSN;Amber Fair, MS, ACSM RCEP, Exercise Physiologist;Portia Rollene Rotunda, RN, BSN   Supervising physician immediately available to respond to emergencies [facility] immediately available   Physician(s) Dr. Daleen Bo   Medication changes reported     No   Fall or balance concerns reported    No   Warm-up and Cool-down Performed as group-led instruction   Resistance Training Performed Yes   VAD Patient? No   Pain Assessment   Currently in Pain? No/denies   Multiple Pain Sites No      Capillary Blood Glucose: No results found for this or any previous visit (from the past 24 hour(s)).   Goals Met:  Exercise tolerated well  Goals Unmet:  Not Applicable  Comments:   QUALITY OF LIFE SCORE REVIEW  Pt completed Quality of Life survey as a participant in Cardiac Rehab. Scores 21.0 or below are considered low. Pt scores are as follows     Quality of Life - 09/29/15 1654    Quality of Life Scores   Health/Function Pre 23.07 %   Socioeconomic Pre 24.79 %   Psych/Spiritual Pre 24.75 %   Family Pre 24.35 %   GLOBAL Pre 23.98 %      Pt scores are well above the low threshold of 21.0.  Pt demonstrates positive and healthy outlook on life with appropriate coping skills.  Pt has supportive family.  Pt denies any psychosocial needs at this time.Will continue to monitor and intervene as necessary.   Carlette Wilber Oliphant RN    Dr. Fransico Him is Medical Director for Cardiac Rehab at [name].

## 2015-10-09 NOTE — Telephone Encounter (Signed)
Called spoke with pt. Informed him that both referrals have been placed. I offered to schedule ov but pt declined stating that he was walking his neighborhood and did not have his calendar by him. He informed him that he would call back to make the follow up. He voiced understanding and had no further questions.

## 2015-10-09 NOTE — Progress Notes (Signed)
Reviewed home exercise with pt today.  Pt plans to walk and do small handheld weights exercise. Pt will exercise 3x/week in addition to coming to [facility] Reviewed THR, pulse, RPE, sign and symptoms, NTG use, and when to call 911 or MD.  Also discussed weather considerations and indoor options.  Pt voiced understanding.    Troy Willis [employer]

## 2015-10-09 NOTE — Telephone Encounter (Signed)
Spoke to patient. He is felling better. 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 Am waiting to hear more from radiology. However, chronic hoarseness persists (not seen ENT in > 10 years) and still having vomit/gerd after food with cough  PLAN  - [facility] referral for cough, gerd - ENT - Dr Benjamine Mola or [facility] for hoarseness, cough  - FU with me in 6-8 weeks to regroup - In between if I hear from radiology will call him  Dr. Brand Males, M.D., [facility].C.P Pulmonary and Critical Care Medicine Staff Physician [place] [facility] Pager: [phone], If no answer or between  15:00h - 7:00h: call [phone]  10/09/2015 1:55 PM

## 2015-10-09 NOTE — Telephone Encounter (Signed)
See othr phone msg

## 2015-10-12 ENCOUNTER — Encounter (HOSPITAL_COMMUNITY): Payer: Medicare Other

## 2015-10-14 ENCOUNTER — Encounter (HOSPITAL_COMMUNITY)
Admission: RE | Admit: 2015-10-14 | Discharge: 2015-10-14 | Disposition: A | Discharge status: Home / Self Care | Payer: Medicare Other | Source: Ambulatory Visit | Attending: Cardiology | Admitting: Cardiology

## 2015-10-14 DIAGNOSIS — Z955 Presence of coronary angioplasty implant and graft: Secondary | ICD-10-CM | POA: Diagnosis not present

## 2015-10-15 ENCOUNTER — Inpatient Hospital Stay (HOSPITAL_COMMUNITY): Admission: RE | Admit: 2015-10-15 | Payer: Medicare Other | Source: Ambulatory Visit

## 2015-10-15 NOTE — Telephone Encounter (Signed)
MR please advise on Dr. Consuela Mimes recs so this can be relayed to pt.  Thanks!

## 2015-10-16 ENCOUNTER — Encounter (HOSPITAL_COMMUNITY)
Admission: RE | Admit: 2015-10-16 | Discharge: 2015-10-16 | Disposition: A | Discharge status: Home / Self Care | Payer: Medicare Other | Source: Ambulatory Visit | Attending: Cardiology | Admitting: Cardiology

## 2015-10-16 DIAGNOSIS — Z86010 Personal history of colonic polyps: Secondary | ICD-10-CM | POA: Insufficient documentation

## 2015-10-16 DIAGNOSIS — Z955 Presence of coronary angioplasty implant and graft: Secondary | ICD-10-CM | POA: Insufficient documentation

## 2015-10-16 DIAGNOSIS — K219 Gastro-esophageal reflux disease without esophagitis: Secondary | ICD-10-CM | POA: Insufficient documentation

## 2015-10-16 DIAGNOSIS — E785 Hyperlipidemia, unspecified: Secondary | ICD-10-CM | POA: Diagnosis not present

## 2015-10-16 DIAGNOSIS — I482 Chronic atrial fibrillation: Secondary | ICD-10-CM | POA: Insufficient documentation

## 2015-10-16 DIAGNOSIS — Z79899 Other long term (current) drug therapy: Secondary | ICD-10-CM | POA: Insufficient documentation

## 2015-10-16 DIAGNOSIS — Z7982 Long term (current) use of aspirin: Secondary | ICD-10-CM | POA: Insufficient documentation

## 2015-10-16 NOTE — Telephone Encounter (Signed)
Spoke with pt and relayed MR's recommendations. Pt has obtained medical records from previous endoscopy and biopsy and will bring those with him to his appt. Nothing further needed.

## 2015-10-16 NOTE — Telephone Encounter (Signed)
No I will decide on CT followup timing after seeing him. Min need to wai 3 months. If I need it sooner or need to push it out will depend on GI and ENT eval and his symptoms when I see him mid july

## 2015-10-16 NOTE — Telephone Encounter (Signed)
I already spoke to him . She has recommended a repeat CT but in interim I referre him to ENT, And GI and fu with me to reassess - see phine note 10/09/15 1:52pm. SO please ensure fu

## 2015-10-16 NOTE — Telephone Encounter (Signed)
Spoke with pt. States that these referrals have already been placed. His ROV with MR has been scheduled for 12/01/15. Pt would like to know if MR wants him to repeat his CT prior to this appointment.  MR - please clarify what kind of CT you would like done and when you would like to be done.

## 2015-10-19 ENCOUNTER — Encounter (HOSPITAL_COMMUNITY)
Admission: RE | Admit: 2015-10-19 | Discharge: 2015-10-19 | Disposition: A | Discharge status: Home / Self Care | Payer: Medicare Other | Source: Ambulatory Visit | Attending: Cardiology | Admitting: Cardiology

## 2015-10-19 DIAGNOSIS — Z955 Presence of coronary angioplasty implant and graft: Secondary | ICD-10-CM | POA: Diagnosis not present

## 2015-10-21 ENCOUNTER — Encounter (HOSPITAL_COMMUNITY)
Admission: RE | Admit: 2015-10-21 | Discharge: 2015-10-21 | Disposition: A | Discharge status: Home / Self Care | Payer: Medicare Other | Source: Ambulatory Visit | Attending: Cardiology | Admitting: Cardiology

## 2015-10-21 DIAGNOSIS — Z955 Presence of coronary angioplasty implant and graft: Secondary | ICD-10-CM

## 2015-10-23 ENCOUNTER — Encounter (HOSPITAL_COMMUNITY)
Admission: RE | Admit: 2015-10-23 | Discharge: 2015-10-23 | Disposition: A | Discharge status: Home / Self Care | Payer: Medicare Other | Source: Ambulatory Visit | Attending: Cardiology | Admitting: Cardiology

## 2015-10-23 DIAGNOSIS — Z955 Presence of coronary angioplasty implant and graft: Secondary | ICD-10-CM

## 2015-10-26 ENCOUNTER — Encounter (HOSPITAL_COMMUNITY): Payer: Medicare Other

## 2015-10-28 ENCOUNTER — Encounter (HOSPITAL_COMMUNITY): Payer: Medicare Other

## 2015-10-30 ENCOUNTER — Encounter (HOSPITAL_COMMUNITY): Payer: Medicare Other

## 2015-11-02 ENCOUNTER — Encounter (HOSPITAL_COMMUNITY)
Admission: RE | Admit: 2015-11-02 | Discharge: 2015-11-02 | Disposition: A | Discharge status: Home / Self Care | Payer: Medicare Other | Source: Ambulatory Visit | Attending: Cardiology | Admitting: Cardiology

## 2015-11-02 DIAGNOSIS — Z955 Presence of coronary angioplasty implant and graft: Secondary | ICD-10-CM | POA: Diagnosis not present

## 2015-11-03 DIAGNOSIS — R05 Cough: Secondary | ICD-10-CM | POA: Insufficient documentation

## 2015-11-03 DIAGNOSIS — R490 Dysphonia: Secondary | ICD-10-CM | POA: Insufficient documentation

## 2015-11-03 DIAGNOSIS — R059 Cough, unspecified: Secondary | ICD-10-CM | POA: Insufficient documentation

## 2015-11-03 DIAGNOSIS — K219 Gastro-esophageal reflux disease without esophagitis: Secondary | ICD-10-CM | POA: Insufficient documentation

## 2015-11-04 ENCOUNTER — Encounter (HOSPITAL_COMMUNITY)
Admission: RE | Admit: 2015-11-04 | Discharge: 2015-11-04 | Disposition: A | Discharge status: Home / Self Care | Payer: Medicare Other | Source: Ambulatory Visit | Attending: Cardiology | Admitting: Cardiology

## 2015-11-04 DIAGNOSIS — Z955 Presence of coronary angioplasty implant and graft: Secondary | ICD-10-CM | POA: Diagnosis not present

## 2015-11-05 NOTE — Progress Notes (Signed)
Cardiac Individual Treatment Plan  Patient Details  Name: Troy Willis MRN: [id number] Date of Birth: [birth year] Referring Provider:        CARDIAC REHAB PHASE II ORIENTATION from 09/29/2015 in [place]   Referring Provider  Fransico Him, MD      Initial Encounter Date:       CARDIAC REHAB PHASE II ORIENTATION from 09/29/2015 in [place]   Date  09/29/15   Referring Provider  Fransico Him, MD      Visit Diagnosis: Stented coronary artery  Patient's Home Medications on Admission:  Current outpatient prescriptions:  .  albuterol (PROVENTIL HFA;VENTOLIN HFA) 108 (90 Base) MCG/ACT inhaler, Inhale 2 puffs into the lungs every 4 (four) hours as needed for wheezing or shortness of breath. (Patient not taking: Reported on 10/05/2015), Disp: 1 Inhaler, Rfl: 0 .  atorvastatin (LIPITOR) 80 MG tablet, Take 1 tablet (80 mg total) by mouth daily at 6 PM., Disp: 30 tablet, Rfl: 6 .  Biotin 5000 MCG CAPS, Take by mouth daily., Disp: , Rfl:  .  celecoxib (CELEBREX) 50 MG capsule, Take 50 mg by mouth daily as needed for pain (Hand inflammation)., Disp: , Rfl:  .  Cholecalciferol (VITAMIN D3) 2000 UNITS TABS, Take by mouth daily., Disp: , Rfl:  .  clopidogrel (PLAVIX) 75 MG tablet, Take 1 tablet (75 mg total) by mouth daily with breakfast., Disp: 30 tablet, Rfl: 6 .  HYDROcodone-GuaiFENesin 2.5-200 MG/5ML SOLN, Take 5 mLs by mouth at bedtime as needed. Reported on 10/05/2015, Disp: , Rfl:  .  Multiple Vitamin (MULTIVITAMIN) tablet, Take 1 tablet by mouth daily., Disp: , Rfl:  .  nitroGLYCERIN (NITROSTAT) 0.4 MG SL tablet, Place 1 tablet (0.4 mg total) under the tongue every 5 (five) minutes x 3 doses as needed for chest pain., Disp: 25 tablet, Rfl: 3 .  Omega-3 Fatty Acids (FISH OIL) 1000 MG CAPS, Take 1 capsule by mouth daily. Reported on 10/05/2015, Disp: , Rfl:  .  ranitidine (ZANTAC) 150 MG tablet, Take 150 mg by mouth daily as  needed for heartburn., Disp: , Rfl:  .  rivaroxaban (XARELTO) 20 MG TABS tablet, Take 1 tablet (20 mg total) by mouth daily with supper., Disp: 30 tablet, Rfl: 6  Past Medical History: Past Medical History  Diagnosis Date  . Hyperlipidemia   . Chronic atrial fibrillation (HCC)     a. CHA2DS2VASc = 2 -->Xarelto started 08/2015.  . Nonspecific elevation of levels of transaminase or lactic acid dehydrogenase (LDH)     PMH of ; ? due to Amiodarone   . Diverticulosis   . Anemia   . Depression   . Arthritis     hands, lower back  . Heart murmur   . Cataracts, bilateral     surgery  . GERD (gastroesophageal reflux disease)   . Hx of adenomatous colonic polyps 08/13/2015  . Pneumonia, bacterial 05/18/2012  . CAD (coronary artery disease), native coronary artery     a. 08/2015 Cath/PCI: LM nl, LAD 46m, LCX small, nl, RCA 80p (4.0x16 Synergy DES),   . Hilar adenopathy     a. 08/2015 CT Chest: mild bilat hilar adenopathy and mild soft tissue prominence in inf right hilum, left hilum. Also areas of soft tissue and low-density material filling LLL bronchial airways->? mucous vs mass.  . Pulmonary nodules     a. 08/2015 CT Chest: reticulonodular opacities in RUL - largest 15mm - likely 2/2 inflammatory process.  Tobacco Use: History  Smoking status  . Never Smoker   Smokeless tobacco  . Former Systems developer  . Types: Chew    Comment: occasional cigar in past; not rgular smoker    Labs: Recent Review Flowsheet Data    Labs for ITP Cardiac and Pulmonary Rehab Latest Ref Rng 12/02/2008 11/22/2011 05/14/2014 01/22/2015 09/08/2015   Cholestrol 0 - 200 mg/dL 166 202(H) 194 188 146   LDLCALC 0 - 99 mg/dL 96 - 123(H) 114(H) 78   LDLDIRECT - - 102.4 - - -   HDL >40 mg/dL 61.60 76.70 59.90 61.60 61   Trlycerides <150 mg/dL 43.0 65.0 57.0 62.0 36   Hemoglobin A1c 4.8 - 5.6 % - - - - 6.0(H)      Capillary Blood Glucose: No results found for: GLUCAP   Exercise Target Goals:    Exercise Program  Goal: Individual exercise prescription set with THRR, safety & activity barriers. Participant demonstrates ability to understand and report RPE using BORG scale, to self-measure pulse accurately, and to acknowledge the importance of the exercise prescription.  Exercise Prescription Goal: Starting with aerobic activity 30 plus minutes a day, 3 days per week for initial exercise prescription. Provide home exercise prescription and guidelines that participant acknowledges understanding prior to discharge.  Activity Barriers & Risk Stratification:     Activity Barriers & Cardiac Risk Stratification - 09/29/15 1440    Activity Barriers & Cardiac Risk Stratification   Activity Barriers None   Cardiac Risk Stratification High      6 Minute Walk:     6 Minute Walk      09/29/15 1649       6 Minute Walk   Phase Initial     Distance 2374 feet     Walk Time 6 minutes     # of Rest Breaks 0     MPH 4.5     METS 5.6     RPE 12     VO2 Peak 19.6     Symptoms No     Resting HR 60 bpm     Resting BP 115/70 mmHg     Max Ex. HR 131 bpm     Max Ex. BP 128/78 mmHg     2 Minute Post BP 114/64 mmHg        Initial Exercise Prescription:     Initial Exercise Prescription - 09/29/15 1600    Date of Initial Exercise RX and Referring Provider   Date 09/29/15   Referring Provider Fransico Him, MD   Treadmill   MPH 3.2   Grade 3   Minutes 10   METs 4.77   Bike   Level 1.9   Minutes 10   METs 5.63   NuStep   Level 3   Minutes 10   METs 3   Prescription Details   Frequency (times per week) 3   Duration Progress to 30 minutes of continuous aerobic without signs/symptoms of physical distress   Intensity   THRR 40-80% of Max Heartrate 75-120 bpm   Ratings of Perceived Exertion 11-13   Progression   Progression Continue to progress workloads to maintain intensity without signs/symptoms of physical distress.   Resistance Training   Training Prescription Yes   Weight 2-3 lbs    Reps 10-12      Perform Capillary Blood Glucose checks as needed.  Exercise Prescription Changes:     Exercise Prescription Changes      10/19/15 1100 10/26/15 1700 11/05/15 1200  Exercise Review   Progression Yes Yes Yes     Response to Exercise   Blood Pressure (Admit) 106/60 mmHg 108/70 mmHg 118/70 mmHg     Blood Pressure (Exercise) 128/60 mmHg 138/64 mmHg 138/60 mmHg     Blood Pressure (Exit) 108/64 mmHg 102/68 mmHg 102/62 mmHg     Heart Rate (Admit) 68 bpm 78 bpm 68 bpm     Heart Rate (Exercise) 135 bpm 132 bpm 131 bpm     Heart Rate (Exit) 76 bpm 76 bpm 73 bpm     Rating of Perceived Exertion (Exercise) 12 12 12      Duration Progress to 30 minutes of continuous aerobic without signs/symptoms of physical distress Progress to 30 minutes of continuous aerobic without signs/symptoms of physical distress Progress to 30 minutes of continuous aerobic without signs/symptoms of physical distress     Intensity THRR unchanged THRR unchanged THRR unchanged     Progression   Progression Continue to progress workloads to maintain intensity without signs/symptoms of physical distress. Continue to progress workloads to maintain intensity without signs/symptoms of physical distress. Continue to progress workloads to maintain intensity without signs/symptoms of physical distress.     Average METs 3.9 5.2 5.4     Resistance Training   Training Prescription Yes Yes Yes     Weight 4lbs 5lbs 5lbs     Reps 10-12 10-12 10-12     Interval Training   Interval Training No No No     Treadmill   MPH 3.2 3.5 3.7     Grade 3 5 5      Minutes 10 10 10      METs 4.77 5.61 6.09     Bike   Level 1.5  decr. WL due to incr. HR 4  moved to upright scifit 6  moved to upright scifit     Minutes 10 10 10      METs 4.76 7      NuStep   Level 4 5 6      Minutes 10 10 19      METs 3.7 4.8 4.8     Home Exercise Plan   Plans to continue exercise at Home  HEP reviewed on 5/26... see progress note Home   HEP reviewed on 5/26... see progress note Home  HEP reviewed on 5/26... see progress note     Frequency Add 3 additional days to program exercise sessions. Add 3 additional days to program exercise sessions. Add 3 additional days to program exercise sessions.        Exercise Comments:     Exercise Comments      11/05/15 1242           Exercise Comments Pt returmed back to [facility] from [facility]. Pt resumed or progressed with exercise workload. Pt is tolerating exercise well; will continue to monitor progression          Discharge Exercise Prescription (Final Exercise Prescription Changes):     Exercise Prescription Changes - 11/05/15 1200    Exercise Review   Progression Yes   Response to Exercise   Blood Pressure (Admit) 118/70 mmHg   Blood Pressure (Exercise) 138/60 mmHg   Blood Pressure (Exit) 102/62 mmHg   Heart Rate (Admit) 68 bpm   Heart Rate (Exercise) 131 bpm   Heart Rate (Exit) 73 bpm   Rating of Perceived Exertion (Exercise) 12   Duration Progress to 30 minutes of continuous aerobic without signs/symptoms of physical distress   Intensity THRR unchanged   Progression  Progression Continue to progress workloads to maintain intensity without signs/symptoms of physical distress.   Average METs 5.4   Resistance Training   Training Prescription Yes   Weight 5lbs   Reps 10-12   Interval Training   Interval Training No   Treadmill   MPH 3.7   Grade 5   Minutes 10   METs 6.09   Bike   Level 6  moved to upright scifit   Minutes 10   NuStep   Level 6   Minutes 19   METs 4.8   Home Exercise Plan   Plans to continue exercise at Home  HEP reviewed on 5/26... see progress note   Frequency Add 3 additional days to program exercise sessions.      Nutrition:  Target Goals: Understanding of nutrition guidelines, daily intake of sodium 1500mg , cholesterol 200mg , calories 30% from fat and 7% or less from saturated fats, daily to have 5 or more servings of  fruits and vegetables.  Biometrics:     Pre Biometrics - 09/29/15 1652    Pre Biometrics   Height 6' 2.5" (1.892 m)   Weight 168 lb 3.4 oz (76.3 kg)   Waist Circumference 33.5 inches   Hip Circumference 40 inches   Waist to Hip Ratio 0.84 %   BMI (Calculated) 21.4   Triceps Skinfold 15 mm   % Body Fat 21.8 %   Grip Strength 44 kg   Flexibility 10 in   Single Leg Stand 30 seconds       Nutrition Therapy Plan and Nutrition Goals:     Nutrition Therapy & Goals - 09/30/15 0834    Nutrition Therapy   Diet Therapeutic Lifestyle Changes   Personal Nutrition Goals   Personal Goal #1 Pt to maintain UBW range of 167-171 lb while in [place], educate and counsel regarding individualized specific dietary modifications aiming towards targeted core components such as weight, hypertension, lipid management, diabetes, heart failure and other comorbidities.   Expected Outcomes Short Term Goal: Understand basic principles of dietary content, such as calories, fat, sodium, cholesterol and nutrients.;Long Term Goal: Adherence to prescribed nutrition plan.      Nutrition Discharge: Nutrition Scores:     Nutrition Assessments - 09/30/15 0833    MEDFICTS Scores   Pre Score 34      Nutrition Goals Re-Evaluation:   Psychosocial: Target Goals: Acknowledge presence or absence of depression, maximize coping skills, provide positive support system. Participant is able to verbalize types and ability to use techniques and skills needed for reducing stress and depression.  Initial Review & Psychosocial Screening:   Quality of Life Scores:     Quality of Life - 09/29/15 1654    Quality of Life Scores   Health/Function Pre 23.07 %   Socioeconomic Pre 24.79 %   Psych/Spiritual Pre 24.75 %   Family Pre 24.35 %   GLOBAL Pre 23.98 %      PHQ-9:     Recent Review Flowsheet Data    Depression screen [facility] 2/9 10/05/2015 01/22/2015 05/14/2014    Decreased Interest 0 0 0   Down, Depressed, Hopeless 0 0 0   PHQ - 2 Score 0 0 0      Psychosocial Evaluation and Intervention:   Psychosocial Re-Evaluation:   Vocational Rehabilitation: Provide vocational rehab assistance to qualifying candidates.   Vocational Rehab Evaluation & Intervention:     Vocational Rehab - 09/29/15 1359    Initial Vocational  Rehab Evaluation & Intervention   Assessment shows need for Vocational Rehabilitation No      Education: Education Goals: Education classes will be provided on a weekly basis, covering required topics. Participant will state understanding/return demonstration of topics presented.  Learning Barriers/Preferences:     Learning Barriers/Preferences - 09/29/15 1441    Learning Barriers/Preferences   Learning Barriers None   Learning Preferences Skilled Demonstration      Education Topics: Count Your Pulse:  -Group instruction provided by verbal instruction, demonstration, patient participation and written materials to support subject.  Instructors address importance of being able to find your pulse and how to count your pulse when at home without a heart monitor.  Patients get hands on experience counting their pulse with staff help and individually.          CARDIAC REHAB PHASE II EXERCISE from 11/04/2015 in [place]   Date  10/16/15   Educator  Andi Hence, RN   Instruction Review Code  2- meets goals/outcomes      Heart Attack, Angina, and Risk Factor Modification:  -Group instruction provided by verbal instruction, video, and written materials to support subject.  Instructors address signs and symptoms of angina and heart attacks.    Also discuss risk factors for heart disease and how to make changes to improve heart health risk factors.   Functional Fitness:  -Group instruction provided by verbal instruction, demonstration, patient participation, and written materials to support  subject.  Instructors address safety measures for doing things around the house.  Discuss how to get up and down off the floor, how to pick things up properly, how to safely get out of a chair without assistance, and balance training.   Meditation and Mindfulness:  -Group instruction provided by verbal instruction, patient participation, and written materials to support subject.  Instructor addresses importance of mindfulness and meditation practice to help reduce stress and improve awareness.  Instructor also leads participants through a meditation exercise.       CARDIAC REHAB PHASE II EXERCISE from 11/04/2015 in [place]   Date  10/07/15   Educator  Jeanella Craze   Instruction Review Code  2- meets goals/outcomes      Stretching for Flexibility and Mobility:  -Group instruction provided by verbal instruction, patient participation, and written materials to support subject.  Instructors lead participants through series of stretches that are designed to increase flexibility thus improving mobility.  These stretches are additional exercise for major muscle groups that are typically performed during regular warm up and cool down.      CARDIAC REHAB PHASE II EXERCISE from 11/04/2015 in [place]   Date  10/09/15   Instruction Review Code  2- meets goals/outcomes      Hands Only CPR Anytime:  -Group instruction provided by verbal instruction, video, patient participation and written materials to support subject.  Instructors co-teach with AHA video for hands only CPR.  Participants get hands on experience with mannequins.   Nutrition I class: Heart Healthy Eating:  -Group instruction provided by PowerPoint slides, verbal discussion, and written materials to support subject matter. The instructor gives an explanation and review of the Therapeutic Lifestyle Changes diet recommendations, which includes a discussion on lipid goals,  dietary fat, sodium, fiber, plant stanol/sterol esters, sugar, and the components of a well-balanced, healthy diet.   Nutrition II class: Lifestyle Skills:  -Group instruction provided by PowerPoint slides, verbal discussion, and  written materials to support subject matter. The instructor gives an explanation and review of label reading, grocery shopping for heart health, heart healthy recipe modifications, and ways to make healthier choices when eating out.   Diabetes Question & Answer:  -Group instruction provided by PowerPoint slides, verbal discussion, and written materials to support subject matter. The instructor gives an explanation and review of diabetes co-morbidities, pre- and post-prandial blood glucose goals, pre-exercise blood glucose goals, signs, symptoms, and treatment of hypoglycemia and hyperglycemia, and foot care basics.   Diabetes Blitz:  -Group instruction provided by PowerPoint slides, verbal discussion, and written materials to support subject matter. The instructor gives an explanation and review of the physiology behind type 1 and type 2 diabetes, diabetes medications and rational behind using different medications, pre- and post-prandial blood glucose recommendations and Hemoglobin A1c goals, diabetes diet, and exercise including blood glucose guidelines for exercising safely.    Portion Distortion:  -Group instruction provided by PowerPoint slides, verbal discussion, written materials, and food models to support subject matter. The instructor gives an explanation of serving size versus portion size, changes in portions sizes over the last 20 years, and what consists of a serving from each food group.   Stress Management:  -Group instruction provided by verbal instruction, video, and written materials to support subject matter.  Instructors review role of stress in heart disease and how to cope with stress positively.     Exercising on Your Own:  -Group instruction  provided by verbal instruction, power point, and written materials to support subject.  Instructors discuss benefits of exercise, components of exercise, frequency and intensity of exercise, and end points for exercise.  Also discuss use of nitroglycerin and activating EMS.  Review options of places to exercise outside of rehab.  Review guidelines for sex with heart disease.   Cardiac Drugs I:  -Group instruction provided by verbal instruction and written materials to support subject.  Instructor reviews cardiac drug classes: antiplatelets, anticoagulants, beta blockers, and statins.  Instructor discusses reasons, side effects, and lifestyle considerations for each drug class.   Cardiac Drugs II:  -Group instruction provided by verbal instruction and written materials to support subject.  Instructor reviews cardiac drug classes: angiotensin converting enzyme inhibitors (ACE-I), angiotensin II receptor blockers (ARBs), nitrates, and calcium channel blockers.  Instructor discusses reasons, side effects, and lifestyle considerations for each drug class.   Anatomy and Physiology of the Circulatory System:  -Group instruction provided by verbal instruction, video, and written materials to support subject.  Reviews functional anatomy of heart, how it relates to various diagnoses, and what role the heart plays in the overall system.   Knowledge Questionnaire Score:     Knowledge Questionnaire Score - 09/30/15 0806    Knowledge Questionnaire Score   Pre Score 22/24      Core Components/Risk Factors/Patient Goals at Admission:     Personal Goals and Risk Factors at Admission - 09/29/15 1505    Core Components/Risk Factors/Patient Goals on Admission   Lipids Yes   Intervention Provide education and support for participant on nutrition & aerobic/resistive exercise along with prescribed medications to achieve LDL 70mg , HDL >40mg .   Expected Outcomes Short Term: Participant states understanding of  desired cholesterol values and is compliant with medications prescribed. Participant is following exercise prescription and nutrition guidelines.;Long Term: Cholesterol controlled with medications as prescribed, with individualized exercise RX and with personalized nutrition plan. Value goals: LDL < 70mg , HDL > 40 mg.      Core Components/Risk  Factors/Patient Goals Review:    Core Components/Risk Factors/Patient Goals at Discharge (Final Review):    ITP Comments:     ITP Comments      09/29/15 1354           ITP Comments Dr. Fransico Him, Medical Director           Comments:  Pt is making expected progress toward personal goals after completing 11 sessions. Recommend continued exercise and life style modification education including  stress management and relaxation techniques to decrease cardiac risk profile. Cherre Huger, BSN

## 2015-11-06 ENCOUNTER — Encounter (HOSPITAL_COMMUNITY)
Admission: RE | Admit: 2015-11-06 | Discharge: 2015-11-06 | Disposition: A | Discharge status: Home / Self Care | Payer: Medicare Other | Source: Ambulatory Visit | Attending: Cardiology | Admitting: Cardiology

## 2015-11-06 DIAGNOSIS — Z955 Presence of coronary angioplasty implant and graft: Secondary | ICD-10-CM

## 2015-11-09 ENCOUNTER — Encounter (HOSPITAL_COMMUNITY)
Admission: RE | Admit: 2015-11-09 | Discharge: 2015-11-09 | Disposition: A | Discharge status: Home / Self Care | Payer: Medicare Other | Source: Ambulatory Visit | Attending: Cardiology | Admitting: Cardiology

## 2015-11-09 DIAGNOSIS — Z955 Presence of coronary angioplasty implant and graft: Secondary | ICD-10-CM | POA: Diagnosis not present

## 2015-11-10 ENCOUNTER — Telehealth: Payer: Self-pay | Admitting: Cardiology

## 2015-11-10 NOTE — Telephone Encounter (Signed)
Instructed patient to speak to MD about trying Protonix as alternative since he is concerned.  Patient understands to call with medication changes so [facility] has an updated med list. He was grateful for assistance.

## 2015-11-10 NOTE — Telephone Encounter (Signed)
There is a theoretical interaction with Plavix and Prevacid but there has not been a strong association with it affecting clinical outcomes.  If he is concerned, would tell him to speak to his other MD about trying Protonix as this is least likely to cause an issue with Plavix.

## 2015-11-10 NOTE — Telephone Encounter (Signed)
Left message to call back  

## 2015-11-10 NOTE — Telephone Encounter (Signed)
New message     Pt c/o medication issue:  1. Name of Medication: prevacid  2. How are you currently taking this medication (dosage and times per day)? 20 mg po twice daily  3. Are you having a reaction (difficulty breathing--STAT)? no  4. What is your medication issue? The pt states he is on Plavix there is another MD who wants to start on him Prevacid, the pt concerns the side-effects of starting this medication is it stops the effects of the plavix the pt wants to know should he take both medications

## 2015-11-10 NOTE — Telephone Encounter (Signed)
To Pharmacy for review.

## 2015-11-11 ENCOUNTER — Encounter (HOSPITAL_COMMUNITY)
Admission: RE | Admit: 2015-11-11 | Discharge: 2015-11-11 | Disposition: A | Discharge status: Home / Self Care | Payer: Medicare Other | Source: Ambulatory Visit | Attending: Cardiology | Admitting: Cardiology

## 2015-11-11 DIAGNOSIS — Z955 Presence of coronary angioplasty implant and graft: Secondary | ICD-10-CM

## 2015-11-13 ENCOUNTER — Encounter (HOSPITAL_COMMUNITY)
Admission: RE | Admit: 2015-11-13 | Discharge: 2015-11-13 | Disposition: A | Discharge status: Home / Self Care | Payer: Medicare Other | Source: Ambulatory Visit | Attending: Cardiology | Admitting: Cardiology

## 2015-11-13 DIAGNOSIS — Z955 Presence of coronary angioplasty implant and graft: Secondary | ICD-10-CM | POA: Diagnosis not present

## 2015-11-16 ENCOUNTER — Encounter (HOSPITAL_COMMUNITY)
Admission: RE | Admit: 2015-11-16 | Discharge: 2015-11-16 | Disposition: A | Discharge status: Home / Self Care | Payer: Medicare Other | Source: Ambulatory Visit | Attending: Cardiology | Admitting: Cardiology

## 2015-11-16 DIAGNOSIS — Z955 Presence of coronary angioplasty implant and graft: Secondary | ICD-10-CM | POA: Diagnosis not present

## 2015-11-16 DIAGNOSIS — E785 Hyperlipidemia, unspecified: Secondary | ICD-10-CM | POA: Diagnosis not present

## 2015-11-16 DIAGNOSIS — Z7982 Long term (current) use of aspirin: Secondary | ICD-10-CM | POA: Insufficient documentation

## 2015-11-16 DIAGNOSIS — K219 Gastro-esophageal reflux disease without esophagitis: Secondary | ICD-10-CM | POA: Insufficient documentation

## 2015-11-16 DIAGNOSIS — Z79899 Other long term (current) drug therapy: Secondary | ICD-10-CM | POA: Diagnosis not present

## 2015-11-16 DIAGNOSIS — Z86010 Personal history of colonic polyps: Secondary | ICD-10-CM | POA: Diagnosis not present

## 2015-11-16 DIAGNOSIS — I482 Chronic atrial fibrillation: Secondary | ICD-10-CM | POA: Insufficient documentation

## 2015-11-18 ENCOUNTER — Encounter (HOSPITAL_COMMUNITY)
Admission: RE | Admit: 2015-11-18 | Discharge: 2015-11-18 | Disposition: A | Discharge status: Home / Self Care | Payer: Medicare Other | Source: Ambulatory Visit | Attending: Cardiology | Admitting: Cardiology

## 2015-11-18 DIAGNOSIS — Z955 Presence of coronary angioplasty implant and graft: Secondary | ICD-10-CM

## 2015-11-19 ENCOUNTER — Encounter: Payer: Self-pay | Admitting: Cardiology

## 2015-11-19 ENCOUNTER — Ambulatory Visit (INDEPENDENT_AMBULATORY_CARE_PROVIDER_SITE_OTHER): Payer: Medicare Other | Admitting: Cardiology

## 2015-11-19 VITALS — BP 120/62 | HR 60 | Ht 75.0 in | Wt 166.0 lb

## 2015-11-19 DIAGNOSIS — I482 Chronic atrial fibrillation, unspecified: Secondary | ICD-10-CM

## 2015-11-19 DIAGNOSIS — E785 Hyperlipidemia, unspecified: Secondary | ICD-10-CM

## 2015-11-19 DIAGNOSIS — I2510 Atherosclerotic heart disease of native coronary artery without angina pectoris: Secondary | ICD-10-CM | POA: Diagnosis not present
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
## 2015-11-19 NOTE — Patient Instructions (Signed)
Medication Instructions:  Your physician recommends that you continue on your current medications as directed. Please refer to the Current Medication list given to you today.   Labwork: Your physician recommends that you return for FASTING lab work.  Testing/Procedures: None ordered today.  Follow-Up: Your physician wants you to follow-up in: 6 months with Dr. Radford Pax. You will receive a reminder letter in the mail two months in advance. If you don't receive a letter, please call our office to schedule the follow-up appointment.   Any Other Special Instructions Will Be Listed Below (If Applicable).     If you need a refill on your cardiac medications before your next appointment, please call your pharmacy.

## 2015-11-20 ENCOUNTER — Encounter (HOSPITAL_COMMUNITY)
Admission: RE | Admit: 2015-11-20 | Discharge: 2015-11-20 | Disposition: A | Discharge status: Home / Self Care | Payer: Medicare Other | Source: Ambulatory Visit | Attending: Cardiology | Admitting: Cardiology

## 2015-11-20 DIAGNOSIS — Z955 Presence of coronary angioplasty implant and graft: Secondary | ICD-10-CM

## 2015-11-20 NOTE — Progress Notes (Signed)
[name] 70 y.o. male Nutrition Note Spoke with pt.  Nutrition Survey reviewed with pt. Pt is following Step 1 of the Therapeutic Lifestyle Changes diet. Pt expressed understanding of the information reviewed. Pt aware of nutrition education classes offered and is unable to attend nutrition classes due to work schedule. Lab Results  Component Value Date   HGBA1C 6.0* 09/08/2015   Wt Readings from Last 3 Encounters:  11/19/15 166 lb (75.297 kg)  09/29/15 168 lb 3.4 oz (76.3 kg)  09/25/15 168 lb (76.204 kg)   Nutrition Diagnosis ? Food-and nutrition-related knowledge deficit related to lack of exposure to information as related to diagnosis of: ? CVD ? Pre-DM Nutrition Intervention ? Benefits of adopting Therapeutic Lifestyle Changes discussed when Medficts reviewed. ? Pt to attend the Portion Distortion class ? Pt given handouts for: ? Nutrition I class ? Nutrition II class ? Continue client-centered nutrition education by RD, as part of interdisciplinary care.  Goal(s) ? Pt to identify and limit food sources of saturated fat, trans fat, and sodium  Monitor and Evaluate progress toward nutrition goal with team.  Derek Mound, M.Ed, RD, LDN, CDE 11/20/2015 8:27 AM

## 2015-11-23 ENCOUNTER — Other Ambulatory Visit (INDEPENDENT_AMBULATORY_CARE_PROVIDER_SITE_OTHER): Payer: Medicare Other | Admitting: *Deleted

## 2015-11-23 ENCOUNTER — Encounter (HOSPITAL_COMMUNITY)
Admission: RE | Admit: 2015-11-23 | Discharge: 2015-11-23 | Disposition: A | Discharge status: Home / Self Care | Payer: Medicare Other | Source: Ambulatory Visit | Attending: Cardiology | Admitting: Cardiology

## 2015-11-23 DIAGNOSIS — Z955 Presence of coronary angioplasty implant and graft: Secondary | ICD-10-CM | POA: Diagnosis not present

## 2015-11-23 DIAGNOSIS — E785 Hyperlipidemia, unspecified: Secondary | ICD-10-CM

## 2015-11-23 DIAGNOSIS — I2510 Atherosclerotic heart disease of native coronary artery without angina pectoris: Secondary | ICD-10-CM | POA: Diagnosis not present

## 2015-11-23 LAB — HEPATIC FUNCTION PANEL
ALK PHOS: 59 U/L (ref 40–115)
ALT: 26 U/L (ref 9–46)
AST: 32 U/L (ref 10–35)
Albumin: 3.9 g/dL (ref 3.6–5.1)
BILIRUBIN DIRECT: 0.2 mg/dL (ref <=0.2)
BILIRUBIN INDIRECT: 0.4 mg/dL (ref 0.2–1.2)
BILIRUBIN TOTAL: 0.6 mg/dL (ref 0.2–1.2)
TOTAL PROTEIN: 6.5 g/dL (ref 6.1–8.1)

## 2015-11-23 LAB — LIPID PANEL
CHOL/HDL RATIO: 1.9 ratio (ref <=5.0)
Cholesterol: 107 mg/dL — ABNORMAL LOW (ref 125–200)
HDL: 55 mg/dL (ref >=40)
LDL CALC: 43 mg/dL (ref <130)
Triglycerides: 45 mg/dL (ref <150)
VLDL: 9 mg/dL (ref <30)

## 2015-11-25 ENCOUNTER — Encounter (HOSPITAL_COMMUNITY)
Admission: RE | Admit: 2015-11-25 | Discharge: 2015-11-25 | Disposition: A | Discharge status: Home / Self Care | Payer: Medicare Other | Source: Ambulatory Visit | Attending: Cardiology | Admitting: Cardiology

## 2015-11-25 DIAGNOSIS — Z955 Presence of coronary angioplasty implant and graft: Secondary | ICD-10-CM | POA: Diagnosis not present

## 2015-11-27 ENCOUNTER — Encounter (HOSPITAL_COMMUNITY)
Admission: RE | Admit: 2015-11-27 | Discharge: 2015-11-27 | Disposition: A | Discharge status: Home / Self Care | Payer: Medicare Other | Source: Ambulatory Visit | Attending: Cardiology | Admitting: Cardiology

## 2015-11-27 DIAGNOSIS — Z955 Presence of coronary angioplasty implant and graft: Secondary | ICD-10-CM

## 2015-11-30 ENCOUNTER — Encounter (HOSPITAL_COMMUNITY)
Admission: RE | Admit: 2015-11-30 | Discharge: 2015-11-30 | Disposition: A | Discharge status: Home / Self Care | Payer: Medicare Other | Source: Ambulatory Visit | Attending: Cardiology | Admitting: Cardiology

## 2015-11-30 DIAGNOSIS — Z955 Presence of coronary angioplasty implant and graft: Secondary | ICD-10-CM

## 2015-12-01 ENCOUNTER — Encounter: Payer: Self-pay | Admitting: Internal Medicine

## 2015-12-01 ENCOUNTER — Ambulatory Visit (INDEPENDENT_AMBULATORY_CARE_PROVIDER_SITE_OTHER): Payer: Medicare Other | Admitting: Internal Medicine

## 2015-12-01 VITALS — BP 122/66 | HR 58 | Ht 75.0 in | Wt 166.0 lb

## 2015-12-01 DIAGNOSIS — R053 Chronic cough: Secondary | ICD-10-CM

## 2015-12-01 DIAGNOSIS — R9389 Abnormal findings on diagnostic imaging of other specified body structures: Secondary | ICD-10-CM | POA: Insufficient documentation

## 2015-12-01 DIAGNOSIS — R938 Abnormal findings on diagnostic imaging of other specified body structures: Secondary | ICD-10-CM

## 2015-12-01 DIAGNOSIS — R05 Cough: Secondary | ICD-10-CM | POA: Diagnosis not present

## 2015-12-01 NOTE — Patient Instructions (Signed)
ICD-9-CM ICD-10-CM   1. Chronic cough 786.2 R05   2. Abnormal CT of the chest 793.2 R93.8     Keep dietary change up and advice of Dr Janace Hoard Keep appt with GI Dr Carlean Purl  Do HRCT chest supine and prone - end august 2017 - 3 month followup  Followup After HRCT in end august 2017
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
## 2015-12-02 ENCOUNTER — Encounter (HOSPITAL_COMMUNITY)
Admission: RE | Admit: 2015-12-02 | Discharge: 2015-12-02 | Disposition: A | Discharge status: Home / Self Care | Payer: Medicare Other | Source: Ambulatory Visit | Attending: Cardiology | Admitting: Cardiology

## 2015-12-02 DIAGNOSIS — Z955 Presence of coronary angioplasty implant and graft: Secondary | ICD-10-CM | POA: Diagnosis not present

## 2015-12-03 NOTE — Progress Notes (Signed)
Cardiac Individual Treatment Plan  Patient Details  Name: Rasean Mayweather MRN: [id number] Date of Birth: March 08, 1946 Referring Provider:        CARDIAC REHAB PHASE II ORIENTATION from 09/29/2015 in [place]   Referring Provider  Fransico Him, MD      Initial Encounter Date:       CARDIAC REHAB PHASE II ORIENTATION from 09/29/2015 in [place]   Date  09/29/15   Referring Provider  Fransico Him, MD      Visit Diagnosis: Stented coronary artery  Patient's Home Medications on Admission:  Current outpatient prescriptions:  .  atorvastatin (LIPITOR) 80 MG tablet, Take 1 tablet (80 mg total) by mouth daily at 6 PM., Disp: 30 tablet, Rfl: 6 .  Biotin 5000 MCG CAPS, Take by mouth daily., Disp: , Rfl:  .  celecoxib (CELEBREX) 50 MG capsule, Take 50 mg by mouth daily as needed for pain (Hand inflammation). Reported on 11/19/2015, Disp: , Rfl:  .  Cholecalciferol (VITAMIN D3) 2000 UNITS TABS, Take by mouth daily., Disp: , Rfl:  .  clopidogrel (PLAVIX) 75 MG tablet, Take 1 tablet (75 mg total) by mouth daily with breakfast., Disp: 30 tablet, Rfl: 6 .  HYDROcodone-GuaiFENesin 2.5-200 MG/5ML SOLN, Take 5 mLs by mouth at bedtime as needed. Reported on 10/05/2015, Disp: , Rfl:  .  Multiple Vitamin (MULTIVITAMIN) tablet, Take 1 tablet by mouth daily., Disp: , Rfl:  .  nitroGLYCERIN (NITROSTAT) 0.4 MG SL tablet, Place 1 tablet (0.4 mg total) under the tongue every 5 (five) minutes x 3 doses as needed for chest pain., Disp: 25 tablet, Rfl: 3 .  pantoprazole (PROTONIX) 40 MG tablet, Take 40 mg by mouth. Once before breakfast, and once before dinner, Disp: , Rfl:  .  ranitidine (ZANTAC) 150 MG tablet, Take 150 mg by mouth daily as needed for heartburn., Disp: , Rfl:  .  rivaroxaban (XARELTO) 20 MG TABS tablet, Take 1 tablet (20 mg total) by mouth daily with supper., Disp: 30 tablet, Rfl: 6  Past Medical History: Past Medical History   Diagnosis Date  . Hyperlipidemia   . Chronic atrial fibrillation (HCC)     a. CHA2DS2VASc = 2 -->Xarelto started 08/2015.  . Nonspecific elevation of levels of transaminase or lactic acid dehydrogenase (LDH)     PMH of ; ? due to Amiodarone   . Diverticulosis   . Anemia   . Depression   . Arthritis     hands, lower back  . Heart murmur   . Cataracts, bilateral     surgery  . GERD (gastroesophageal reflux disease)   . Hx of adenomatous colonic polyps 08/13/2015  . Pneumonia, bacterial 05/18/2012  . CAD (coronary artery disease), native coronary artery     a. 08/2015 Cath/PCI: LM nl, LAD 61m, LCX small, nl, RCA 80p (4.0x16 Synergy DES),   . Hilar adenopathy     a. 08/2015 CT Chest: mild bilat hilar adenopathy and mild soft tissue prominence in inf right hilum, left hilum. Also areas of soft tissue and low-density material filling LLL bronchial airways->? mucous vs mass.  . Pulmonary nodules     a. 08/2015 CT Chest: reticulonodular opacities in RUL - largest 20mm - likely 2/2 inflammatory process.    Tobacco Use: History  Smoking status  . Never Smoker   Smokeless tobacco  . Former Systems developer  . Types: Chew    Comment: occasional cigar in past; not rgular smoker  Labs: Recent Review Flowsheet Data    Labs for ITP Cardiac and Pulmonary Rehab Latest Ref Rng 11/22/2011 05/14/2014 01/22/2015 09/08/2015 11/23/2015   Cholestrol 125 - 200 mg/dL 202(H) 194 188 146 107(L)   LDLCALC <130 mg/dL - 123(H) 114(H) 78 43   LDLDIRECT - 102.4 - - - -   HDL >=40 mg/dL 76.70 59.90 61.60 61 55   Trlycerides <150 mg/dL 65.0 57.0 62.0 36 45   Hemoglobin A1c 4.8 - 5.6 % - - - 6.0(H) -      Capillary Blood Glucose: No results found for: GLUCAP   Exercise Target Goals:    Exercise Program Goal: Individual exercise prescription set with THRR, safety & activity barriers. Participant demonstrates ability to understand and report RPE using BORG scale, to self-measure pulse accurately, and to acknowledge the  importance of the exercise prescription.  Exercise Prescription Goal: Starting with aerobic activity 30 plus minutes a day, 3 days per week for initial exercise prescription. Provide home exercise prescription and guidelines that participant acknowledges understanding prior to discharge.  Activity Barriers & Risk Stratification:     Activity Barriers & Cardiac Risk Stratification - 09/29/15 1440    Activity Barriers & Cardiac Risk Stratification   Activity Barriers None   Cardiac Risk Stratification High      6 Minute Walk:     6 Minute Walk      09/29/15 1649       6 Minute Walk   Phase Initial     Distance 2374 feet     Walk Time 6 minutes     # of Rest Breaks 0     MPH 4.5     METS 5.6     RPE 12     VO2 Peak 19.6     Symptoms No     Resting HR 60 bpm     Resting BP 115/70 mmHg     Max Ex. HR 131 bpm     Max Ex. BP 128/78 mmHg     2 Minute Post BP 114/64 mmHg        Initial Exercise Prescription:     Initial Exercise Prescription - 09/29/15 1600    Date of Initial Exercise RX and Referring Provider   Date 09/29/15   Referring Provider Fransico Him, MD   Treadmill   MPH 3.2   Grade 3   Minutes 10   METs 4.77   Bike   Level 1.9   Minutes 10   METs 5.63   NuStep   Level 3   Minutes 10   METs 3   Prescription Details   Frequency (times per week) 3   Duration Progress to 30 minutes of continuous aerobic without signs/symptoms of physical distress   Intensity   THRR 40-80% of Max Heartrate 75-120 bpm   Ratings of Perceived Exertion 11-13   Progression   Progression Continue to progress workloads to maintain intensity without signs/symptoms of physical distress.   Resistance Training   Training Prescription Yes   Weight 2-3 lbs   Reps 10-12      Perform Capillary Blood Glucose checks as needed.  Exercise Prescription Changes:     Exercise Prescription Changes      10/19/15 1100 10/26/15 1700 11/05/15 1200 11/13/15 1500 11/23/15 1000    Exercise Review   Progression Yes Yes Yes Yes Yes   Response to Exercise   Blood Pressure (Admit) 106/60 mmHg 108/70 mmHg 118/70 mmHg 118/70 mmHg 104/68 mmHg   Blood Pressure (  Exercise) 128/60 mmHg 138/64 mmHg 138/60 mmHg 138/60 mmHg 150/78 mmHg   Blood Pressure (Exit) 108/64 mmHg 102/68 mmHg 102/62 mmHg 102/62 mmHg 102/64 mmHg   Heart Rate (Admit) 68 bpm 78 bpm 68 bpm 68 bpm 69 bpm   Heart Rate (Exercise) 135 bpm 132 bpm 131 bpm 131 bpm 144 bpm   Heart Rate (Exit) 76 bpm 76 bpm 73 bpm 73 bpm 79 bpm   Rating of Perceived Exertion (Exercise) 12 12 12 12 12    Comments     THR increase per Dr. Radford Pax, 142 bpm   Duration Progress to 30 minutes of continuous aerobic without signs/symptoms of physical distress Progress to 30 minutes of continuous aerobic without signs/symptoms of physical distress Progress to 30 minutes of continuous aerobic without signs/symptoms of physical distress Progress to 30 minutes of continuous aerobic without signs/symptoms of physical distress Progress to 30 minutes of continuous aerobic without signs/symptoms of physical distress   Intensity THRR unchanged THRR unchanged THRR unchanged THRR unchanged THRR unchanged   Progression   Progression Continue to progress workloads to maintain intensity without signs/symptoms of physical distress. Continue to progress workloads to maintain intensity without signs/symptoms of physical distress. Continue to progress workloads to maintain intensity without signs/symptoms of physical distress. Continue to progress workloads to maintain intensity without signs/symptoms of physical distress. Continue to progress workloads to maintain intensity without signs/symptoms of physical distress.   Average METs 3.9 5.2 5.4 5.4 6.5   Resistance Training   Training Prescription Yes Yes Yes Yes Yes   Weight 4lbs 5lbs 5lbs 5lbs 6LBS   Reps 10-12 10-12 10-12 10-12 10-12   Interval Training   Interval Training No No No No No   Treadmill   MPH 3.2  3.5 3.7 3.7 3.8   Grade 3 5 5 5 6    Minutes 10 10 10 10 10    METs 4.77 5.61 6.09 6.09 7.05   Bike   Level 1.5  decr. WL due to incr. HR 4  moved to upright scifit 6  moved to upright scifit 6  moved to upright scifit 6  moved to upright scifit   Minutes 10 10 10 10 10    METs 4.76 7      NuStep   Level 4 5 6 6 6    Minutes 10 10 19 19 19    METs 3.7 4.8 4.8 4.8 6   Home Exercise Plan   Plans to continue exercise at Home  HEP reviewed on 5/26... see progress note Home  HEP reviewed on 5/26... see progress note Home  HEP reviewed on 5/26... see progress note Home  HEP reviewed on 5/26... see progress note Home  HEP reviewed on 5/26... see progress note   Frequency Add 3 additional days to program exercise sessions. Add 3 additional days to program exercise sessions. Add 3 additional days to program exercise sessions. Add 3 additional days to program exercise sessions. Add 3 additional days to program exercise sessions.     12/02/15 1600           Exercise Review   Progression Yes       Response to Exercise   Blood Pressure (Admit) 126/70 mmHg       Blood Pressure (Exercise) 140/80 mmHg       Blood Pressure (Exit) 104/70 mmHg       Heart Rate (Admit) 75 bpm       Heart Rate (Exercise) 142 bpm       Heart Rate (Exit) 71 bpm  Rating of Perceived Exertion (Exercise) 13       Comments THR increase per Dr. Radford Pax, 142 bpm       Duration Progress to 30 minutes of continuous aerobic without signs/symptoms of physical distress       Intensity THRR unchanged       Progression   Progression Continue to progress workloads to maintain intensity without signs/symptoms of physical distress.       Average METs 7       Resistance Training   Training Prescription Yes       Weight 6LBS       Reps 10-12       Interval Training   Interval Training No       Treadmill   MPH 3.8       Grade 6       Minutes 10       METs 7.05       Bike   Level --  moved to upright scifit        NuStep   Level 7       Minutes 10       METs 6.6       Rower   Level 2       Watts 81       Minutes 10       METs 7       Home Exercise Plan   Plans to continue exercise at Home  HEP reviewed on 5/26... see progress note       Frequency Add 3 additional days to program exercise sessions.          Exercise Comments:     Exercise Comments      11/05/15 1242 11/23/15 1042         Exercise Comments Pt returmed back to [facility] from [employer]. Pt resumed or progressed with exercise workload. Pt is tolerating exercise well; will continue to monitor progression Reviewed METs and goals and pt is tolerating exercise well. Will continue to monitor exercise progression         Discharge Exercise Prescription (Final Exercise Prescription Changes):     Exercise Prescription Changes - 12/02/15 1600    Exercise Review   Progression Yes   Response to Exercise   Blood Pressure (Admit) 126/70 mmHg   Blood Pressure (Exercise) 140/80 mmHg   Blood Pressure (Exit) 104/70 mmHg   Heart Rate (Admit) 75 bpm   Heart Rate (Exercise) 142 bpm   Heart Rate (Exit) 71 bpm   Rating of Perceived Exertion (Exercise) 13   Comments THR increase per Dr. Radford Pax, 142 bpm   Duration Progress to 30 minutes of continuous aerobic without signs/symptoms of physical distress   Intensity THRR unchanged   Progression   Progression Continue to progress workloads to maintain intensity without signs/symptoms of physical distress.   Average METs 7   Resistance Training   Training Prescription Yes   Weight 6LBS   Reps 10-12   Interval Training   Interval Training No   Treadmill   MPH 3.8   Grade 6   Minutes 10   METs 7.05   Bike   Level --  moved to upright scifit   NuStep   Level 7   Minutes 10   METs 6.6   Rower   Level 2   Watts 81   Minutes 10   METs 7   Home Exercise Plan   Plans to continue exercise at [place]  reviewed on 5/26... see progress note   Frequency Add 3 additional days to  program exercise sessions.      Nutrition:  Target Goals: Understanding of nutrition guidelines, daily intake of sodium 1500mg , cholesterol 200mg , calories 30% from fat and 7% or less from saturated fats, daily to have 5 or more servings of fruits and vegetables.  Biometrics:     Pre Biometrics - 09/29/15 1652    Pre Biometrics   Height 6' 2.5" (1.892 m)   Weight 168 lb 3.4 oz (76.3 kg)   Waist Circumference 33.5 inches   Hip Circumference 40 inches   Waist to Hip Ratio 0.84 %   BMI (Calculated) 21.4   Triceps Skinfold 15 mm   % Body Fat 21.8 %   Grip Strength 44 kg   Flexibility 10 in   Single Leg Stand 30 seconds       Nutrition Therapy Plan and Nutrition Goals:     Nutrition Therapy & Goals - 09/30/15 0834    Nutrition Therapy   Diet Therapeutic Lifestyle Changes   Personal Nutrition Goals   Personal Goal #1 Pt to maintain UBW range of 167-171 lb while in [place], educate and counsel regarding individualized specific dietary modifications aiming towards targeted core components such as weight, hypertension, lipid management, diabetes, heart failure and other comorbidities.   Expected Outcomes Short Term Goal: Understand basic principles of dietary content, such as calories, fat, sodium, cholesterol and nutrients.;Long Term Goal: Adherence to prescribed nutrition plan.      Nutrition Discharge: Nutrition Scores:     Nutrition Assessments - 11/20/15 0833    MEDFICTS Scores   Pre Score 54      Nutrition Goals Re-Evaluation:   Psychosocial: Target Goals: Acknowledge presence or absence of depression, maximize coping skills, provide positive support system. Participant is able to verbalize types and ability to use techniques and skills needed for reducing stress and depression.  Initial Review & Psychosocial Screening:   Quality of Life Scores:     Quality of Life - 09/29/15 1654    Quality of Life  Scores   Health/Function Pre 23.07 %   Socioeconomic Pre 24.79 %   Psych/Spiritual Pre 24.75 %   Family Pre 24.35 %   GLOBAL Pre 23.98 %      PHQ-9:     Recent Review Flowsheet Data    Depression screen [facility] 2/9 10/05/2015 01/22/2015 05/14/2014   Decreased Interest 0 0 0   Down, Depressed, Hopeless 0 0 0   PHQ - 2 Score 0 0 0      Psychosocial Evaluation and Intervention:   Psychosocial Re-Evaluation:   Vocational Rehabilitation: Provide vocational rehab assistance to qualifying candidates.   Vocational Rehab Evaluation & Intervention:     Vocational Rehab - 09/29/15 1359    Initial Vocational Rehab Evaluation & Intervention   Assessment shows need for Vocational Rehabilitation No      Education: Education Goals: Education classes will be provided on a weekly basis, covering required topics. Participant will state understanding/return demonstration of topics presented.  Learning Barriers/Preferences:     Learning Barriers/Preferences - 09/29/15 1441    Learning Barriers/Preferences   Learning Barriers None   Learning Preferences Skilled Demonstration      Education Topics: Count Your Pulse:  -Group instruction provided by verbal instruction, demonstration, patient participation and written materials to support subject.  Instructors address importance of being able to find your pulse and how to  count your pulse when at home without a heart monitor.  Patients get hands on experience counting their pulse with staff help and individually.          CARDIAC REHAB PHASE II EXERCISE from 11/25/2015 in [place]   Date  10/16/15   Educator  Andi Hence, RN   Instruction Review Code  2- meets goals/outcomes      Heart Attack, Angina, and Risk Factor Modification:  -Group instruction provided by verbal instruction, video, and written materials to support subject.  Instructors address signs and symptoms of angina and heart attacks.     Also discuss risk factors for heart disease and how to make changes to improve heart health risk factors.      CARDIAC REHAB PHASE II EXERCISE from 11/25/2015 in [place]   Date  11/06/15   Instruction Review Code  2- meets goals/outcomes      Functional Fitness:  -Group instruction provided by verbal instruction, demonstration, patient participation, and written materials to support subject.  Instructors address safety measures for doing things around the house.  Discuss how to get up and down off the floor, how to pick things up properly, how to safely get out of a chair without assistance, and balance training.      CARDIAC REHAB PHASE II EXERCISE from 11/25/2015 in [place]   Date  11/13/15   Instruction Review Code  2- meets goals/outcomes      Meditation and Mindfulness:  -Group instruction provided by verbal instruction, patient participation, and written materials to support subject.  Instructor addresses importance of mindfulness and meditation practice to help reduce stress and improve awareness.  Instructor also leads participants through a meditation exercise.       CARDIAC REHAB PHASE II EXERCISE from 11/25/2015 in [place]   Date  10/07/15   Educator  Jeanella Craze   Instruction Review Code  2- meets goals/outcomes      Stretching for Flexibility and Mobility:  -Group instruction provided by verbal instruction, patient participation, and written materials to support subject.  Instructors lead participants through series of stretches that are designed to increase flexibility thus improving mobility.  These stretches are additional exercise for major muscle groups that are typically performed during regular warm up and cool down.      CARDIAC REHAB PHASE II EXERCISE from 11/25/2015 in [place]   Date  10/09/15   Instruction Review Code  2- meets  goals/outcomes      Hands Only CPR Anytime:  -Group instruction provided by verbal instruction, video, patient participation and written materials to support subject.  Instructors co-teach with AHA video for hands only CPR.  Participants get hands on experience with mannequins.   Nutrition I class: Heart Healthy Eating:  -Group instruction provided by PowerPoint slides, verbal discussion, and written materials to support subject matter. The instructor gives an explanation and review of the Therapeutic Lifestyle Changes diet recommendations, which includes a discussion on lipid goals, dietary fat, sodium, fiber, plant stanol/sterol esters, sugar, and the components of a well-balanced, healthy diet.      CARDIAC REHAB PHASE II EXERCISE from 11/25/2015 in [place]   Date  11/20/15   Educator  RD   Instruction Review Code  Not applicable [Class handouts given]      Nutrition II class: Lifestyle Skills:  -Group instruction provided  by PowerPoint slides, verbal discussion, and written materials to support subject matter. The instructor gives an explanation and review of label reading, grocery shopping for heart health, heart healthy recipe modifications, and ways to make healthier choices when eating out.      CARDIAC REHAB PHASE II EXERCISE from 11/25/2015 in [place]   Date  11/20/15   Educator  RD   Instruction Review Code  Not applicable [Class handouts given]      Diabetes Question & Answer:  -Group instruction provided by PowerPoint slides, verbal discussion, and written materials to support subject matter. The instructor gives an explanation and review of diabetes co-morbidities, pre- and post-prandial blood glucose goals, pre-exercise blood glucose goals, signs, symptoms, and treatment of hypoglycemia and hyperglycemia, and foot care basics.   Diabetes Blitz:  -Group instruction provided by PowerPoint slides, verbal  discussion, and written materials to support subject matter. The instructor gives an explanation and review of the physiology behind type 1 and type 2 diabetes, diabetes medications and rational behind using different medications, pre- and post-prandial blood glucose recommendations and Hemoglobin A1c goals, diabetes diet, and exercise including blood glucose guidelines for exercising safely.    Portion Distortion:  -Group instruction provided by PowerPoint slides, verbal discussion, written materials, and food models to support subject matter. The instructor gives an explanation of serving size versus portion size, changes in portions sizes over the last 20 years, and what consists of a serving from each food group.      CARDIAC REHAB PHASE II EXERCISE from 11/25/2015 in [place]   Date  11/11/15   Educator  RD   Instruction Review Code  2- meets goals/outcomes      Stress Management:  -Group instruction provided by verbal instruction, video, and written materials to support subject matter.  Instructors review role of stress in heart disease and how to cope with stress positively.     Exercising on Your Own:  -Group instruction provided by verbal instruction, power point, and written materials to support subject.  Instructors discuss benefits of exercise, components of exercise, frequency and intensity of exercise, and end points for exercise.  Also discuss use of nitroglycerin and activating EMS.  Review options of places to exercise outside of rehab.  Review guidelines for sex with heart disease.   Cardiac Drugs I:  -Group instruction provided by verbal instruction and written materials to support subject.  Instructor reviews cardiac drug classes: antiplatelets, anticoagulants, beta blockers, and statins.  Instructor discusses reasons, side effects, and lifestyle considerations for each drug class.   Cardiac Drugs II:  -Group instruction provided by verbal  instruction and written materials to support subject.  Instructor reviews cardiac drug classes: angiotensin converting enzyme inhibitors (ACE-I), angiotensin II receptor blockers (ARBs), nitrates, and calcium channel blockers.  Instructor discusses reasons, side effects, and lifestyle considerations for each drug class.      CARDIAC REHAB PHASE II EXERCISE from 11/25/2015 in [place]   Date  11/25/15   Instruction Review Code  2- meets goals/outcomes      Anatomy and Physiology of the Circulatory System:  -Group instruction provided by verbal instruction, video, and written materials to support subject.  Reviews functional anatomy of heart, how it relates to various diagnoses, and what role the heart plays in the overall system.   Knowledge Questionnaire Score:     Knowledge Questionnaire Score - 09/30/15 0806    Knowledge Questionnaire Score  Pre Score 22/24      Core Components/Risk Factors/Patient Goals at Admission:     Personal Goals and Risk Factors at Admission - 09/29/15 1505    Core Components/Risk Factors/Patient Goals on Admission   Lipids Yes   Intervention Provide education and support for participant on nutrition & aerobic/resistive exercise along with prescribed medications to achieve LDL 70mg , HDL >40mg .   Expected Outcomes Short Term: Participant states understanding of desired cholesterol values and is compliant with medications prescribed. Participant is following exercise prescription and nutrition guidelines.;Long Term: Cholesterol controlled with medications as prescribed, with individualized exercise RX and with personalized nutrition plan. Value goals: LDL < 70mg , HDL > 40 mg.      Core Components/Risk Factors/Patient Goals Review:      Goals and Risk Factor Review      11/23/15 1043           Core Components/Risk Factors/Patient Goals Review   Personal Goals Review Other       Review Pt is walking and doing handheld  weights on off days (T/TH/Sat) Pt plans to graduate at the end of the month and then continue with exercise at the gym       Expected Outcomes Pt will graduate meeting all goals and will continue with exercise at the gym          Core Components/Risk Factors/Patient Goals at Discharge (Final Review):      Goals and Risk Factor Review - 11/23/15 1043    Core Components/Risk Factors/Patient Goals Review   Personal Goals Review Other   Review Pt is walking and doing handheld weights on off days (T/TH/Sat) Pt plans to graduate at the end of the month and then continue with exercise at the gym   Expected Outcomes Pt will graduate meeting all goals and will continue with exercise at the gym      ITP Comments:     ITP Comments      09/29/15 1354           ITP Comments Dr. Fransico Him, Medical Director           Comments:  Pt is making expected progress toward personal goals after completing 23 sessions.  Pt continues to exhibit positive coping skills with no psychosocial needs with no further intervention warranted. Pt has good support system in place. Recommend continued exercise and life style modification education including  stress management and relaxation techniques to decrease cardiac risk profile. Cherre Huger, BSN

## 2015-12-04 ENCOUNTER — Encounter (HOSPITAL_COMMUNITY)
Admission: RE | Admit: 2015-12-04 | Discharge: 2015-12-04 | Disposition: A | Discharge status: Home / Self Care | Payer: Medicare Other | Source: Ambulatory Visit | Attending: Cardiology | Admitting: Cardiology

## 2015-12-04 DIAGNOSIS — Z955 Presence of coronary angioplasty implant and graft: Secondary | ICD-10-CM

## 2015-12-07 ENCOUNTER — Encounter (HOSPITAL_COMMUNITY)
Admission: RE | Admit: 2015-12-07 | Discharge: 2015-12-07 | Disposition: A | Discharge status: Home / Self Care | Payer: Medicare Other | Source: Ambulatory Visit | Attending: Cardiology | Admitting: Cardiology

## 2015-12-07 DIAGNOSIS — Z955 Presence of coronary angioplasty implant and graft: Secondary | ICD-10-CM | POA: Diagnosis not present

## 2015-12-08 ENCOUNTER — Telehealth: Payer: Self-pay | Admitting: Internal Medicine

## 2015-12-08 NOTE — Telephone Encounter (Signed)
I reviewed with the patient our conversation from 2013.  He now recalls.  He will keep his appt with Dr. Carlean Purl on Friday to discuss his symptoms.

## 2015-12-09 ENCOUNTER — Encounter (HOSPITAL_COMMUNITY)
Admission: RE | Admit: 2015-12-09 | Discharge: 2015-12-09 | Disposition: A | Discharge status: Home / Self Care | Payer: Medicare Other | Source: Ambulatory Visit | Attending: Cardiology | Admitting: Cardiology

## 2015-12-09 DIAGNOSIS — Z955 Presence of coronary angioplasty implant and graft: Secondary | ICD-10-CM | POA: Diagnosis not present

## 2015-12-11 ENCOUNTER — Encounter (HOSPITAL_COMMUNITY)
Admission: RE | Admit: 2015-12-11 | Discharge: 2015-12-11 | Disposition: A | Discharge status: Home / Self Care | Payer: Medicare Other | Source: Ambulatory Visit | Attending: Cardiology | Admitting: Cardiology

## 2015-12-11 ENCOUNTER — Ambulatory Visit (INDEPENDENT_AMBULATORY_CARE_PROVIDER_SITE_OTHER): Payer: Medicare Other | Admitting: Internal Medicine

## 2015-12-11 ENCOUNTER — Encounter: Payer: Self-pay | Admitting: Internal Medicine

## 2015-12-11 VITALS — BP 80/48 | HR 64 | Ht 75.0 in | Wt 168.8 lb

## 2015-12-11 DIAGNOSIS — R1310 Dysphagia, unspecified: Secondary | ICD-10-CM

## 2015-12-11 DIAGNOSIS — K219 Gastro-esophageal reflux disease without esophagitis: Secondary | ICD-10-CM

## 2015-12-11 DIAGNOSIS — Z955 Presence of coronary angioplasty implant and graft: Secondary | ICD-10-CM | POA: Diagnosis not present

## 2015-12-11 DIAGNOSIS — R05 Cough: Secondary | ICD-10-CM

## 2015-12-11 DIAGNOSIS — R053 Chronic cough: Secondary | ICD-10-CM
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
## 2015-12-11 NOTE — Patient Instructions (Signed)
We will decide when to have you follow up after your barium esophagram.  You have been scheduled for a Barium Esophogram at [facility] (1st floor of the hospital) on Thursday, 12/17/15 at 11:00 am. Please arrive 15 minutes prior to your appointment for registration. Make certain not to have anything to eat or drink 6 hours prior to your test. If you need to reschedule for any reason, please contact radiology at [phone] to do so. __________________________________________________________________ A barium swallow is an examination that concentrates on views of the esophagus. This tends to be a double contrast exam (barium and two liquids which, when combined, create a gas to distend the wall of the oesophagus) or single contrast (non-ionic iodine based). The study is usually tailored to your symptoms so a good history is essential. Attention is paid during the study to the form, structure and configuration of the esophagus, looking for functional disorders (such as aspiration, dysphagia, achalasia, motility and reflux) EXAMINATION You may be asked to change into a gown, depending on the type of swallow being performed. A radiologist and radiographer will perform the procedure. The radiologist will advise you of the type of contrast selected for your procedure and direct you during the exam. You will be asked to stand, sit or lie in several different positions and to hold a small amount of fluid in your mouth before being asked to swallow while the imaging is performed .In some instances you may be asked to swallow barium coated marshmallows to assess the motility of a solid food bolus. The exam can be recorded as a digital or video fluoroscopy procedure. POST PROCEDURE It will take 1-2 days for the barium to pass through your system. To facilitate this, it is important, unless otherwise directed, to increase your fluids for the next 24-48hrs and to resume your normal diet.  This test typically  takes about 30 minutes to perform. ______________________________________________________________________________ If you are age 40 or older, your body mass index should be between 23-30. Your Body mass index is 21.1 kg/m. If this is out of the aforementioned range listed, please consider follow up with your Primary Care Provider.  If you are age 65 or younger, your body mass index should be between 19-25. Your Body mass index is 21.1 kg/m. If this is out of the aformentioned range listed, please consider follow up with your Primary Care Provider.   Thank you for choosing me and [facility].  Gatha Mayer, MD, [facility]

## 2015-12-12 ENCOUNTER — Telehealth: Payer: Self-pay | Admitting: Internal Medicine

## 2015-12-12 NOTE — Telephone Encounter (Signed)
Troy Willis wanted me to inform his daughter Troy Willis of the plan. Please call daughter and apologize on my behalf for delay because I was super busy. Pls let her know is plan for CT end august 2017 and fu after that  Thanks  Dr. Brand Males, M.D., [facility]C.P Pulmonary and Critical Care Medicine Staff Physician [place] [facility] Pager: [phone], If no answer or between  15:00h - 7:00h: call [phone]  12/12/2015 6:20 AM

## 2015-12-14 ENCOUNTER — Encounter (HOSPITAL_COMMUNITY)
Admission: RE | Admit: 2015-12-14 | Discharge: 2015-12-14 | Disposition: A | Discharge status: Home / Self Care | Payer: Medicare Other | Source: Ambulatory Visit | Attending: Cardiology | Admitting: Cardiology

## 2015-12-14 DIAGNOSIS — Z955 Presence of coronary angioplasty implant and graft: Secondary | ICD-10-CM | POA: Diagnosis not present

## 2015-12-14 NOTE — Telephone Encounter (Signed)
Pt called back, asked me to call his daughter [name] Both at [phone]. atc pt's daughter, lmtcb.

## 2015-12-14 NOTE — Telephone Encounter (Signed)
Called and spoke with pt, states he would like for me to speak to his daughter [name] Both about his course of treatment.  Pt did not have daughter's number to give to me.  Gave pt our office number to give to his daughter, so she can call at her convenience.   Will await phone call.

## 2015-12-16 ENCOUNTER — Encounter: Payer: Self-pay | Admitting: Cardiology

## 2015-12-16 ENCOUNTER — Encounter (HOSPITAL_COMMUNITY)
Admission: RE | Admit: 2015-12-16 | Discharge: 2015-12-16 | Disposition: A | Discharge status: Home / Self Care | Payer: Medicare Other | Source: Ambulatory Visit | Attending: Cardiology | Admitting: Cardiology

## 2015-12-16 DIAGNOSIS — Z955 Presence of coronary angioplasty implant and graft: Secondary | ICD-10-CM | POA: Insufficient documentation

## 2015-12-16 DIAGNOSIS — Z79899 Other long term (current) drug therapy: Secondary | ICD-10-CM | POA: Diagnosis not present

## 2015-12-16 DIAGNOSIS — Z7982 Long term (current) use of aspirin: Secondary | ICD-10-CM | POA: Diagnosis not present

## 2015-12-16 DIAGNOSIS — I482 Chronic atrial fibrillation: Secondary | ICD-10-CM | POA: Insufficient documentation

## 2015-12-16 DIAGNOSIS — Z86010 Personal history of colonic polyps: Secondary | ICD-10-CM | POA: Diagnosis not present

## 2015-12-16 DIAGNOSIS — K219 Gastro-esophageal reflux disease without esophagitis: Secondary | ICD-10-CM | POA: Insufficient documentation

## 2015-12-16 DIAGNOSIS — E785 Hyperlipidemia, unspecified: Secondary | ICD-10-CM | POA: Diagnosis not present

## 2015-12-17 ENCOUNTER — Ambulatory Visit (HOSPITAL_COMMUNITY)
Admission: RE | Admit: 2015-12-17 | Discharge: 2015-12-17 | Disposition: A | Discharge status: Home / Self Care | Payer: Medicare Other | Source: Ambulatory Visit | Attending: Internal Medicine | Admitting: Internal Medicine

## 2015-12-17 DIAGNOSIS — R938 Abnormal findings on diagnostic imaging of other specified body structures: Secondary | ICD-10-CM | POA: Insufficient documentation

## 2015-12-17 DIAGNOSIS — K219 Gastro-esophageal reflux disease without esophagitis: Secondary | ICD-10-CM | POA: Insufficient documentation

## 2015-12-17 DIAGNOSIS — R1310 Dysphagia, unspecified: Secondary | ICD-10-CM | POA: Diagnosis present

## 2015-12-17 NOTE — Telephone Encounter (Signed)
Pt daughter [name] Both called back - aware of rec's per MR Aware that the order for the CT scan will be placed to be done at the end of August 2017. Dr Chase Caller please advise on if you would like just a plain CT w/o contrast? Thanks.

## 2015-12-18 ENCOUNTER — Encounter (HOSPITAL_COMMUNITY)
Admission: RE | Admit: 2015-12-18 | Discharge: 2015-12-18 | Disposition: A | Discharge status: Home / Self Care | Payer: Medicare Other | Source: Ambulatory Visit | Attending: Cardiology | Admitting: Cardiology

## 2015-12-18 ENCOUNTER — Telehealth: Payer: Self-pay | Admitting: Internal Medicine

## 2015-12-18 DIAGNOSIS — Z955 Presence of coronary angioplasty implant and graft: Secondary | ICD-10-CM | POA: Diagnosis not present

## 2015-12-18 NOTE — Telephone Encounter (Signed)
Spoke with the pt  He states already has CT chest that MR ordered scheduled for 01/14/16  He wants to keep this appt  I have scheduled with appt with BQ for 01/19/16  Called pulmonary rehab and spoke with Thayer Headings and she states will have Portia call us back

## 2015-12-18 NOTE — Progress Notes (Signed)
Radiologist noted some possible abnormality in the back of the throat and recommended direct look - he has seen Dr. Janace Hoard and should keep f/u with him - looks like plans to examine with ENT scope in the works - initial direct laryngoscopy was NL Ask him to f/u me in November  Please cc Dr. Janace Hoard

## 2015-12-18 NOTE — Telephone Encounter (Signed)
Triage 1. Pls let Marquavis Mardis know that I spoke to daughter [name] Both 12/18/2015  2. Ok to change to BQ 3. Order High Resolution CT chest without contrast on ILD protocol. DO both Supine and Prone Images. Only  Dr Lorin Picket or Dr Salvatore Marvel Dr. Vinnie Langton to read.  - for 3rd week in august 2017 (around 01/05/16) - FU with BQ shortly after that -pls let me know date so I can sign off 4. Please ask rehab if patient is deaturating there with walking - he did not in office - but daughter says he still is very dyspneic (LVEF normal with cath) while walking  Thanks  Dr. Brand Males, M.D., [facility].C.P Pulmonary and Critical Care Medicine Staff Physician [place] [facility] Pager: [phone], If no answer or between  15:00h - 7:00h: call [phone]  12/18/2015 10:19 AM     Long discussion with daughter [name] Both 12/18/2015 that ended  10:19 AM  . Discussed  1. Nodules in Ct - 93mm  - explained too small for diagnostic tech currently other than serial fu 2. Mucoid impaction - explained hard to explain in absence of description of definitive bronchiectasis and imprioving CT. Will see what repeat CT entails 3. Chronic cough - multi decade clearing of throat but drop off past year - explained unclear if this was due to abnromal CT or CAD with stents or both- would like to se results oF barium swallow, and fu CT end aug 2017 to dterimine. Discussed possibluity of ILD - and different varieties of ILD. She wanted to know all about ILD. Explained prednisone responsive varieites and IPF with poor prognosis and antifibrotix Rx. Explained that once CT done end of this month then we can determine how to workup ILD if ILD is there 4. Dyspnea - still very dyspenic despite cardiac rehab. EF normal in cath April 2017. Did not desat here in office. Explained if ILD present this can cotnriubte to dyspnea. Will get cardiac rehab to test his walking desats 5. Explained  that we are balancing observation /workup time v rushing in for ILD testing and is trade off  6. HE wants to siwtch to BQ - will let him know

## 2015-12-18 NOTE — Telephone Encounter (Signed)
I'm OK to switch too but it MUST be a 30 minute visit. Thanks. Looks like he is complicated and so I need the 30 minute visit.

## 2015-12-18 NOTE — Telephone Encounter (Signed)
Called and spoke with Troy Willis from pulm rehab.  She stated that the pt wanted to change providers from MR to BQ.  Will check with both providers to have the ok to do this.  Will call the pt to make him aware of decision.    MR and BQ please advise. thanks

## 2015-12-18 NOTE — Telephone Encounter (Signed)
Forwarding to [place] for follow up.

## 2015-12-18 NOTE — Telephone Encounter (Signed)
Pt is indeed scheduled for 30 min spot  Still awaiting rehab to call back

## 2015-12-21 ENCOUNTER — Encounter (HOSPITAL_COMMUNITY)
Admission: RE | Admit: 2015-12-21 | Discharge: 2015-12-21 | Disposition: A | Discharge status: Home / Self Care | Payer: Medicare Other | Source: Ambulatory Visit | Attending: Cardiology | Admitting: Cardiology

## 2015-12-21 ENCOUNTER — Telehealth: Payer: Self-pay

## 2015-12-21 DIAGNOSIS — Z955 Presence of coronary angioplasty implant and graft: Secondary | ICD-10-CM

## 2015-12-21 NOTE — Telephone Encounter (Signed)
lmtcb for pulm rehab.  

## 2015-12-21 NOTE — Telephone Encounter (Signed)
Just an FYI, Troy Willis co/pay is $47.00 per month. He is just worried about reaching the donut hole, THEN having a higher co/pay. I advised him to continue the Xarelto till then, and to let us know at that time, so we could address the problem. He is agreeable to that plan.

## 2015-12-22 ENCOUNTER — Telehealth: Payer: Self-pay | Admitting: Internal Medicine

## 2015-12-22 NOTE — Telephone Encounter (Signed)
Katy,  Please get a record of patient's HR during cardiac rehab and make sure he has scheuduled followup with me

## 2015-12-22 NOTE — Telephone Encounter (Signed)
Hello Troy  Finnis Willis is in the [place] for chronic hoarseness/cough and abnormal CT from April 2017 during Stent time but has since improved in may 2017 He has fu Ct pending end aug 2017 after which he will switch from seeing me to Dr Lynnea Maizes there is some improvement in his CT April -> May 2017, daughter last week on phone told me he is still very dyspneic. He did not desat in our office and I have asked rehab to check his puilse ox during cardiac rehab/ Reviewing his chart noticed he is actually done better in cardiac rehab from 3.9 mets at start to doing > 6 mets and rhab team seem pleased with his progress.   So if indeed he has residual dyspnea worse than baseline - we will know his CT chest  end of this month if there are lung issues involved. However,  wondering about cardiac issues -a) you think his HR response at rehab is blunted? B)  noticed no fu with you ; c) ? How is his EF  Please address  Thanks  Dr. Brand Males, M.D., [facility].C.P Pulmonary and Critical Care Medicine Staff Physician [place] [facility] Pager: [phone], If no answer or between  15:00h - 7:00h: call [phone]  12/22/2015 5:06 AM

## 2015-12-22 NOTE — Telephone Encounter (Signed)
Troy Willis  I sent staff message to [place] at rehab 12/22/2015 12:39 AM. Will fwd that to you. Need ex pulse ox and HR with exertino there. Let them know to document and send via epic because daughter says patient still dyspneic  Thanks  Dr. Brand Males, M.D., [facility].C.P Pulmonary and Critical Care Medicine Staff Physician [place] [facility] Pager: [phone], If no answer or between  15:00h - 7:00h: call [phone]  12/22/2015 12:39 AM

## 2015-12-23 ENCOUNTER — Encounter (HOSPITAL_COMMUNITY)
Admission: RE | Admit: 2015-12-23 | Discharge: 2015-12-23 | Disposition: A | Discharge status: Home / Self Care | Payer: Medicare Other | Source: Ambulatory Visit | Attending: Cardiology | Admitting: Cardiology

## 2015-12-23 ENCOUNTER — Telehealth (HOSPITAL_COMMUNITY): Payer: Self-pay | Admitting: *Deleted

## 2015-12-23 DIAGNOSIS — Z955 Presence of coronary angioplasty implant and graft: Secondary | ICD-10-CM

## 2015-12-23 NOTE — Telephone Encounter (Signed)
Called pulmonary rehab and spoke with Carlette. Exertional sats and HR were obtained on pt. Carlette is updating chart to reflect results.   MR these results will be in a telephone encounter per Carlette.

## 2015-12-23 NOTE — Telephone Encounter (Signed)
Please assure patient that Dr. Chase Caller wanted to make sure that HR was adequate when he was exercising.  He does not need an immediated Followup appt unless he is having problems.  Just make sure he has followup in 3 months

## 2015-12-23 NOTE — Telephone Encounter (Signed)
Message sent to Cardiac Rehab to fax record of patient's HR to [phone].   Mr. Dargenio was surprised when he was called to schedule appointment. He reports he is not having any symptoms - he denies CP and SOB. He graduated from [facility] today and was extremely impressed with the program. He does not think he needs to be seen soon, but will do what Dr. Radford Pax recommends.  To Dr. Radford Pax for appropriate follow-up.

## 2015-12-23 NOTE — Telephone Encounter (Signed)
Daneil Dan He is switching to [place] - he has fu with [facility] 01/21/16.  But seems he has one to see me after that; can be canceled  Thanks  Dr. Brand Males, M.D., [facility]C.P Pulmonary and Critical Care Medicine Staff Physician [place] [facility] Pager: [phone], If no answer or between  15:00h - 7:00h: call [phone]  12/23/2015 6:05 PM     ...................... I called Yuvaan Olander  6:05 PM 12/23/2015 \   I reviewed [place] notes:  oxygen levels were adequate and normal during exercise  but is possible is dropping from high end of normal to lower end of normal (but still normal). I think the followup HRCT end of monthe (ensure is hrct is there for august 20-30 2017) can help [place] know how much of the lung findings are still present and if is contributing to dyspnea. He verbalized understanding. He wil have C 8/31 and fu with [name] 01/21/16.       Good Morning Dr. Alver Fisher completed his last day of phase II cardiac rehab with the completion of 32 sessions in 12 weeks.  In talking with pt regarding his symptoms, pt reports feeling short of breath while exercising and the inability to achieve desired workloads. I talked with him about his perception and desire to return to previous workloads that were achieved when he was much younger. Advised him that comparing him with other patients his age his workloads and achieved MET levels are far superior in comparison. He believes this is not the case.   Here are his following stats for todays exercise at 6:45 am  We do 3-10 stations in cardiac rehab  Resting pre exercise HR 74, bp 112/70, O2 sat 99 weight 76.4 kg  #1 - HR 142 Nustep level 7 o2 sat at lowest point 93-95 met average 6.1 that is lower than usual due to having only use of one hand due to the finger probe(we did not have a forehead probe available  #2 - HR135 bp 138/80 02 sat 94 Treadmill 3.8/8.0  #3- HR95 bp 112/66 o2  sat 97 Weights  He does the following  Lateral pull down 50 pound weight 5 reps  Tricep extension 40 pound weight 5 reps  Bicep curls 40 pound weight 15 reps  Seated chest press 30 pound weight 15 reps  Leg curls 50 pound weight 15 reps  Hamstring curls 37 1/2 pound weight 15 reps  Cool down Post exercise Hr 70, bp 106/62, o2 sat 100    Thanks  Psychologist, clinical

## 2015-12-23 NOTE — Telephone Encounter (Signed)
Received phone call from [facility] pulmonology.  Requested oximetry readings for exercise to be documented in epic. Indicated I would copy my in basket sent to Dr. Chase Caller per his request by in basket.  The message is to follow  Good Morning Dr. Alver Fisher completed his last day of phase II cardiac rehab with the completion of 32 sessions in 12 weeks.  In talking with pt regarding his symptoms, pt reports feeling short of breath while exercising and the inability to achieve desired workloads. I talked with him about his perception and desire to return to previous workloads that were achieved when he was much younger. Advised him that comparing him with other patients his age his workloads and achieved MET levels are far superior in comparison. He believes this is not the case.   Here are his following stats for todays exercise at 6:45 am  We do 3-10 stations in cardiac rehab  Resting pre exercise HR 74, bp 112/70, O2 sat 99 weight 76.4 kg  #1 - HR 142 Nustep level 7 o2 sat at lowest point 93-95 met average 6.1 that is lower than usual due to having only use of one hand due to the finger probe(we did not have a forehead probe available  #2 - HR135 bp 138/80 02 sat 94 Treadmill 3.8/8.0  #3- HR95 bp 112/66 o2 sat 97 Weights  He does the following  Lateral pull down 50 pound weight 5 reps  Tricep extension 40 pound weight 5 reps  Bicep curls 40 pound weight 15 reps  Seated chest press 30 pound weight 15 reps  Leg curls 50 pound weight 15 reps  Hamstring curls 37 1/2 pound weight 15 reps  Cool down Post exercise Hr 70, bp 106/62, o2 sat 100   Thanks  [facility] RN   Previous Messages    ----- Message -----  From: Brand Males, MD  Sent: 12/23/2015  4:43 AM  To: Rowe Pavy, RN, Collier [place], RN, *   Thanks [place]. After I sent you this message noticed that he has improved from 3 to 6.3 mets and has been doing well. So, not sure if daughter's concern is a bit out dated  or naybe he is still not back to baseline. Given concern with abnormal CT Knowing his pulse ox with this high level (esp with forehead probe if you have it) will be useful  ----- Message -----  From: Feliz Beam Divincenzo, RCEP  Sent: 12/22/2015  9:53 AM  To: Rowe Pavy, RN, Brand Males, MD   Dr. Chase Caller,   He works at a high intensity when he is here at rehab (6.3 METs). I am not in the office tomorrow but I have forwarded this message on to [facility], his nurse in cardiac rehab, and she is going to follow up with you regarding the information you have requested.   Molly diVincenzo   ----- Message -----  From: Brand Males, MD  Sent: 12/22/2015 12:11 AM  To: Benedetto Goad, RN, Brand Males, MD, *   Hi guys   Kanon Novosel might or might not have ILD; he is awaitin a CT end august 2017 but he is in cardiac rehab and daughte tells me he is still having significant dyspnea. Not sure if hisheart or lung related or deconditioning . If deconditioning why not better with cardiac rehab. Walking desats test 185 feet x 3 laps in our office without desats. Can you please give me a) sense  of his current ex. Performance; and b) can you monitor his pulse ox and HR at one of his biggest exertions and report back   Thanks   Dr. Brand Males, M.D., [facility].C.P  Pulmonary and Critical Care Medicine  Staff Physician  [place] [facility]  Pager: [phone], If no answer or between 15:00h - 7:00h: call [phone]   12/22/2015  12:13 AM

## 2015-12-24 NOTE — Telephone Encounter (Signed)
pls see earlier documentation - this has already been changed He is on schedule to have CT and then see BQ 01/21/16. On comptuer there is another appt to see me but this can be canceled  Dr. Brand Males, M.D., [facility].C.P Pulmonary and Critical Care Medicine Staff Physician [place] [facility] Pager: [phone], If no answer or between  15:00h - 7:00h: call [phone]  12/24/2015 8:59 AM

## 2015-12-24 NOTE — Telephone Encounter (Signed)
MR and BQ please advise. thanks

## 2015-12-24 NOTE — Telephone Encounter (Signed)
Spoke with pt. He is aware of his appointment with BQ on 01/21/16. OV with MR has been canceled. Nothing further was needed.

## 2015-12-25 ENCOUNTER — Encounter (HOSPITAL_COMMUNITY): Payer: Medicare Other

## 2015-12-28 ENCOUNTER — Encounter (HOSPITAL_COMMUNITY): Payer: Medicare Other

## 2015-12-28 ENCOUNTER — Telehealth: Payer: Self-pay | Admitting: Internal Medicine

## 2015-12-28 NOTE — Telephone Encounter (Signed)
Review ENT Dr. Janace Hoard note from 12/11/2015. It is noted that patient's cough and hoarseness are better after he doubled up on his Protonix. No vocal cord lesions were found. Speech therapy can be offered to the patient according to Dr. Janace Hoard. But he suspects that the voice of a difficult to change.

## 2015-12-29 NOTE — Telephone Encounter (Signed)
Scheduled patient 11/10. He understands to call if he starts to experience symptoms. He was grateful for call.

## 2015-12-30 ENCOUNTER — Encounter (HOSPITAL_COMMUNITY): Payer: Medicare Other

## 2016-01-01 ENCOUNTER — Encounter (HOSPITAL_COMMUNITY): Payer: Medicare Other

## 2016-01-04 ENCOUNTER — Encounter (HOSPITAL_COMMUNITY): Payer: Medicare Other

## 2016-01-06 ENCOUNTER — Encounter (HOSPITAL_COMMUNITY): Payer: Medicare Other

## 2016-01-08 ENCOUNTER — Encounter (HOSPITAL_COMMUNITY): Payer: Medicare Other

## 2016-01-11 ENCOUNTER — Encounter (HOSPITAL_COMMUNITY): Payer: Medicare Other

## 2016-01-13 ENCOUNTER — Encounter (HOSPITAL_COMMUNITY): Payer: Medicare Other

## 2016-01-14 ENCOUNTER — Ambulatory Visit (INDEPENDENT_AMBULATORY_CARE_PROVIDER_SITE_OTHER)
Admission: RE | Admit: 2016-01-14 | Discharge: 2016-01-14 | Disposition: A | Discharge status: Home / Self Care | Payer: Medicare Other | Source: Ambulatory Visit | Attending: Internal Medicine | Admitting: Internal Medicine

## 2016-01-14 DIAGNOSIS — R938 Abnormal findings on diagnostic imaging of other specified body structures: Secondary | ICD-10-CM

## 2016-01-14 DIAGNOSIS — R053 Chronic cough: Secondary | ICD-10-CM

## 2016-01-14 DIAGNOSIS — R05 Cough: Secondary | ICD-10-CM | POA: Diagnosis not present

## 2016-01-14 DIAGNOSIS — R9389 Abnormal findings on diagnostic imaging of other specified body structures: Secondary | ICD-10-CM

## 2016-01-15 ENCOUNTER — Encounter (HOSPITAL_COMMUNITY): Payer: Medicare Other

## 2016-01-15 ENCOUNTER — Telehealth: Payer: Self-pay | Admitting: Internal Medicine

## 2016-01-15 ENCOUNTER — Encounter: Payer: Self-pay | Admitting: Cardiology

## 2016-01-15 NOTE — Telephone Encounter (Signed)
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 Brand Males, M.D., [facility]C.P Pulmonary and Critical Care Medicine Staff Physician [facility] Pager: [phone], If no answer or between  15:00h - 7:00h: call [phone]  01/15/2016 12:46 PM      Ct Chest High Resolution  Result Date: 01/14/2016 CLINICAL DATA:  70 year old male with persistent chronic cough. Followup evaluation. EXAM: CT CHEST WITHOUT CONTRAST TECHNIQUE: Multidetector CT imaging of the chest was performed following the standard protocol without intravenous contrast. High resolution imaging of the lungs, as well as inspiratory and expiratory imaging, was performed. COMPARISON:  Chest CT 10/05/2015. FINDINGS: Cardiovascular: Heart size is enlarged with biatrial dilatation. There is no significant pericardial fluid, thickening or pericardial calcification. There is aortic atherosclerosis, as well as atherosclerosis of the great vessels of the mediastinum and the coronary arteries, including calcified atherosclerotic plaque in the left main, left anterior descending, left circumflex and right coronary arteries. Mediastinum/Nodes: No pathologically enlarged mediastinal or hilar lymph nodes. Please note that accurate exclusion of hilar adenopathy is limited on noncontrast CT scans. Esophagus is unremarkable in appearance. No axillary lymphadenopathy. Lungs/Pleura: High-resolution again demonstrate patchy areas of ground-glass attenuation and mild peripheral  septal thickening scattered throughout the lungs bilaterally. These findings have no definitive craniocaudal gradient, and appear generally stable compared to the prior examination. No associated honeycombing. There are few areas of scattered mild cylindrical bronchiectasis with some associated mucoid impaction, including areas of peribronchovascular micronodularity, which appear very similar to the prior examination and are most compatible with areas of chronic mucoid impaction. The largest of these nodules measure up to 6 mm in the left lower lobe (unchanged). No other larger more suspicious appearing pulmonary nodules or masses are identified. Inspiratory and expiratory imaging demonstrates a few areas of mild air trapping, indicative of mild small airways disease. Mild diffuse bronchial wall thickening with mild centrilobular and paraseptal emphysema. No acute consolidative airspace disease. No pleural effusions. Upper Abdomen: Aortic atherosclerosis. Musculoskeletal: There are no aggressive appearing lytic or blastic lesions noted in the visualized portions of the skeleton. IMPRESSION: 1. 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 2. Mild diffuse bronchial wall thickening with mild centrilobular and paraseptal emphysema. 3. Cardiomegaly with biatrial dilatation. 4. Aortic atherosclerosis, in addition to left main and 3 vessel coronary artery disease. Please note that although the presence of coronary artery calcium documents the presence of coronary artery disease, the severity of this disease and any potential stenosis cannot be assessed on this non-gated CT examination. Assessment for potential risk factor modification, dietary therapy or pharmacologic therapy may be warranted, if clinically indicated. 5. Multiple tiny pulmonary nodules are again noted, stable compared to the  prior study, most compatible with areas of chronic mucoid impaction within terminal bronchioles. These all measure 6 mm or less in size. Repeat noncontrast chest CT could  be considered in 12-18 months to ensure continued stability these findings. This recommendation follows the consensus statement: Guidelines for Management of Incidental Pulmonary Nodules Detected on CT Images:From the Fleischner Society 2017; published online before print (10.1148/radiol.SG:5268862). 6. Additional incidental findings, as above. Electronically Signed   By: Vinnie Langton M.D.   On: 01/14/2016 10:38

## 2016-01-20 ENCOUNTER — Encounter (HOSPITAL_COMMUNITY): Payer: Medicare Other

## 2016-01-21 ENCOUNTER — Encounter: Payer: Self-pay | Admitting: Pulmonary Disease

## 2016-01-21 ENCOUNTER — Ambulatory Visit (INDEPENDENT_AMBULATORY_CARE_PROVIDER_SITE_OTHER): Payer: Medicare Other | Admitting: Pulmonary Disease

## 2016-01-21 VITALS — BP 114/56 | HR 62 | Ht 75.0 in | Wt 166.6 lb

## 2016-01-21 DIAGNOSIS — J479 Bronchiectasis, uncomplicated: Secondary | ICD-10-CM | POA: Insufficient documentation

## 2016-01-21 DIAGNOSIS — J849 Interstitial pulmonary disease, unspecified: Secondary | ICD-10-CM | POA: Diagnosis not present

## 2016-01-21 DIAGNOSIS — Z23 Encounter for immunization: Secondary | ICD-10-CM | POA: Diagnosis not present

## 2016-01-21 DIAGNOSIS — J432 Centrilobular emphysema: Secondary | ICD-10-CM | POA: Insufficient documentation

## 2016-01-21 DIAGNOSIS — R911 Solitary pulmonary nodule: Secondary | ICD-10-CM | POA: Insufficient documentation

## 2016-01-21 NOTE — Progress Notes (Signed)
Subjective:    Patient ID: Troy Willis, male    DOB: Aug 25, 1945, 70 y.o.   MRN: [id number]  HPI Chief Complaint  Patient presents with  . Advice Only    pt switching care from MR to BQ- being treated for chronic cough.     Troy Willis is a 70 year old gentleman has been referred to me for a second opinion on chronic cough and non-specific airways and interstitial lung disease.    He tells me that he had a normal childhood without respiratory illnesses but in November 1968 when he was in the [employer] he contracted "[place] influenza" and with symptoms of cough, mucus production, and shortness of breath for approximately 6 months. He was eventually treated with an antibiotic which helped improve his symptoms and he was able returned to active duty. He was not hospitalized for the illness at this time. However, ever since 1968 he still with recurrent bronchitis episodes over the years which occur on about an every other year basis. He says that he has a chronic cough as well which has occurred off and on over the years and has been associated with "a tickling in his throat".  Despite the recurrent bronchitis attacks he's never been hospitalized for respiratory problem. He notes that the most severe attack of bronchitis of curd around December 2016 and after that he had about 3 months of ongoing chest congestion and shortness of breath.  In April 2017 he had a heart attack in and up having a drug-eluting stent placed. He's been on Plavix ever since then. The drug-eluting stent was placed into the proximal right coronary artery. During that hospital visit he was also started on Xarelto for chronic A. fib with a chads 2 score of 2.  Of note, he had a history of atrial fibrillation prior to this admission. A CT scan of his chest was performed at that time which noted some hilar adenopathy as well as what appeared to be mucus plugging in the bases of his lungs. There is also concern for possible  interstitial lung disease so he was referred to the pulmonary clinic for further evaluation.  Since the spring of 2017 he has participated in cardiac rehabilitation and he says that he still has some shortness of breath when he exerts himself on the exercise machines but he is able to walk on level ground at a pace of about 45 miles an hour and can walk 4-5 miles. He does this several times per week.  He continues to have cough with mucus production in the mornings, its typically gray in color. He will then have a cough throughout the course of the day. The cough is also worse after any sort of meal particularly lunch and dinner.  His cough has been evaluated with a barium swallow which was worrisome for an esophageal ring. He was evaluated by ear nose and throat which showed evidence of likely laryngopharyngeal reflux but there is no evidence of an esophageal stricture on endoscopy.  04/2014 diagnosed with pneumonia.  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     Past Medical History:  Diagnosis Date  . Anemia   . Arthritis    hands, lower back  . CAD (coronary artery disease), native coronary artery  a. 08/2015 Cath/PCI: LM nl, LAD 44m, LCX small, nl, RCA 80p (4.0x16 Synergy DES),   . Cataracts, bilateral    surgery  . Chronic atrial fibrillation (HCC)    a. CHA2DS2VASc = 2 -->Xarelto started 08/2015.  [name] Depression   . Diverticulosis   . GERD (gastroesophageal reflux disease)   . Heart murmur   . Hilar adenopathy    a. 08/2015 CT Chest: mild bilat hilar adenopathy and mild soft tissue prominence in inf right hilum, left hilum. Also areas of soft tissue and low-density material filling LLL bronchial airways->? mucous vs mass.  [name] Hx of adenomatous colonic polyps  08/13/2015  . Hyperlipidemia   . Nonspecific elevation of levels of transaminase or lactic acid dehydrogenase (LDH)    PMH of ; ? due to Amiodarone   . Pneumonia, bacterial 05/18/2012  . Pulmonary nodules    a. 08/2015 CT Chest: reticulonodular opacities in RUL - largest 46mm - likely 2/2 inflammatory process.     Family History  Problem Relation Age of Onset  . Cancer Mother     oral  . COPD Father   . Heart attack Father   . Heart attack Brother     died from MI at age 22, sister CABG 08/2015, brother CABG in his 34's.   . Arthritis Sister   . Colon cancer Sister 21  . Arthritis Sister   . Heart disease Sister   . Arthritis Sister   . Heart disease Brother   . Coronary artery disease    . Suicidality    . Ulcers Neg Hx   . Esophageal cancer Neg Hx   . Pancreatic cancer Neg Hx   . Stomach cancer Neg Hx      Social History   Social History  . Marital status: Married    Spouse name: N/A  . Number of children: 3  . Years of education: N/A   Occupational History  . Engineer    Social History Main Topics  . Smoking status: Never Smoker  . Smokeless tobacco: Former Systems developer    Types: Chew     Comment: occasional cigar in past; not regular smoker  . Alcohol use 0.6 oz/week    1 Cans of beer per week     Comment: 1 beer every 2 weeks  . Drug use: No  . Sexual activity: No   Other Topics Concern  . Not on file   Social History Narrative   Daily caffeine      Allergies  Allergen Reactions  . Amiodarone Other (See Comments)    REACTION: elevated liver enzymes  . Metoprolol     Hypotension  . Prevacid [Lansoprazole]     Cannot take with plavix     Outpatient Medications Prior to Visit  Medication Sig Dispense Refill  . atorvastatin (LIPITOR) 80 MG tablet Take 1 tablet (80 mg total) by mouth daily at 6 PM. 30 tablet 6  . Biotin 5000 MCG CAPS Take by mouth daily.    . celecoxib (CELEBREX) 50 MG capsule Take 50 mg by mouth daily as needed for pain (Hand  inflammation). Reported on 11/19/2015    . Cholecalciferol (VITAMIN D3) 2000 UNITS TABS Take by mouth daily.    . clopidogrel (PLAVIX) 75 MG tablet Take 1 tablet (75 mg total) by mouth daily with breakfast. 30 tablet 6  . Multiple Vitamin (MULTIVITAMIN) tablet Take 1 tablet by mouth daily.    . nitroGLYCERIN (NITROSTAT) 0.4 MG SL tablet Place 1 tablet (0.4  mg total) under the tongue every 5 (five) minutes x 3 doses as needed for chest pain. 25 tablet 3  . pantoprazole (PROTONIX) 40 MG tablet Take 40 mg by mouth daily.     . rivaroxaban (XARELTO) 20 MG TABS tablet Take 1 tablet (20 mg total) by mouth daily with supper. 30 tablet 6   No facility-administered medications prior to visit.       Review of Systems  Constitutional: Negative for fever and unexpected weight change.  HENT: Positive for congestion. Negative for dental problem, ear pain, nosebleeds, postnasal drip, rhinorrhea, sinus pressure, sneezing, sore throat and trouble swallowing.   Eyes: Negative for redness and itching.  Respiratory: Positive for cough. Negative for chest tightness, shortness of breath and wheezing.   Cardiovascular: Negative for palpitations and leg swelling.  Gastrointestinal: Negative for nausea and vomiting.  Genitourinary: Negative for dysuria.  Musculoskeletal: Negative for joint swelling.  Skin: Negative for rash.  Neurological: Negative for headaches.  Hematological: Does not bruise/bleed easily.  Psychiatric/Behavioral: Negative for dysphoric mood. The patient is not nervous/anxious.        Objective:   Physical Exam Vitals:   01/21/16 1431  BP: (!) 114/56  Pulse: 62  SpO2: 95%  Weight: 166 lb 9.6 oz (75.6 kg)  Height: 6\' 3"  (1.905 m)   RA  Gen: well appearing, no acute distress HENT: NCAT, OP clear, neck supple without masses Eyes: PERRL, EOMi Lymph: no cervical lymphadenopathy PULM: CTA B (surprisingly clear), normal effort CV: Irreg irreg, no mgr, no JVD GI: BS+, soft, nontender,  no hsm Derm: no rash or skin breakdown MSK: normal bulk and tone Neuro: A&Ox4, CN II-XII intact, strength 5/5 in all 4 extremities Psyche: normal mood and affect   01/14/2016 CT chest images personally reviewed showing nonspecific fibrotic type changes in the periphery of his lungs bilaterally also with mild to moderate bronchiectasis changes some mucoid impaction in the bases, pulmonary nodule noted  CBC    Component Value Date/Time   WBC 9.0 09/08/2015 0203   RBC 3.25 (L) 09/08/2015 0203   HGB 11.0 (L) 09/08/2015 0203   HCT 32.9 (L) 09/08/2015 0203   PLT 161 09/08/2015 0203   MCV 101.2 (H) 09/08/2015 0203   MCH 33.8 09/08/2015 0203   MCHC 33.4 09/08/2015 0203   RDW 13.8 09/08/2015 0203   LYMPHSABS 1.9 01/22/2015 1133   MONOABS 0.5 01/22/2015 1133   EOSABS 0.1 01/22/2015 1133   BASOSABS 0.0 01/22/2015 1133        Assessment & Plan:  Cough He has a chronic cough which I think is multifactorial at best. Ongoing acid reflux contributes and is likely the primary cause followed closely by ongoing laryngeal irritation. I believe the laryngeal irritation causes cyclical cough syndrome or some people caught irritable larynx syndrome. Certainly his bronchiectasis may contribute but I think to a much lesser degree.  Plan: Take Pepcid in the evenings GERD lifestyle modification and specific dietary changes were recommended today Continue PPI in the morning Voice rest was strongly encouraged If no improvement then consider enrollment in a clinical trial for cough  ILD (interstitial lung disease) ([place]) He has a very mild interstitial infiltrate in the periphery of his lungs which is nonspecific at this time. It is likely the explanation for his diminished DLCO on pulmonary function testing. Fortunately, the rest of his PFTs are essentially normal some hopeful that this is just a mild nonspecific abnormality which will not need further workup. However, the only way to know  for certain  what is causing this to be to perform an open lung biopsy. I think this is not necessary considering his clinical improvement in terms of dyspnea over the last few months.  Plan: Repeat pulmonary function testing in November 2017, if evidence of worsening disease been consider open lung biopsy   Bronchiectasis without acute exacerbation [facility]) Personal review of his CT chest images shows significant mucus plugging in the bases of his lungs as well as diffuse cylindrical bronchiectasis. These imaging findings are consistent with his clinical presentation of a severe respiratory infection as a young adult followed by multiple recurrent airway infections over the years.  At this point I see no indication for additional inhaled medicines unless he has recurrent exacerbations.  Patients who have bronchiectasis have an increased burden of bacterial colonization in the airways and in the event of a recurrent episode of bronchitis he would need to be treated with antibiotics sooner rather than later. Specifically, he would likely need treatment for Pseudomonas.  He is also at increased risk for AFB colonization and/or infection based on his bronchiectasis. This may explain some of the nodules that were seen on his CT that I think the majority of the nodules were explained by mucus impaction.  Plan summary Sputum culture for AFB, fungal, bacteria Use flutter valve in the event of an upper respiratory infection He will need an antibiotic in the event of an upper respiratory infection (bronchitis) Hold off on adding any further treatment at this time  Solitary pulmonary nodule Multiple scattered nodules were noted on the August 30 first 2017 CT scan. The radiologist has recommended a repeat image in 6-12 months. We'll plan on repeating it in 12 months given all the CT scans he said recently unless he and his family feel otherwise. Will discuss more on the next visit.  Centrilobular emphysema  (HCC) Centrilobular emphysema as well as mucus plugging and chronic airway thickening was noted on the recent CT scan of the chest.  Plan: We'll need to obtain an alpha-1 antitrypsin test on the next visit Hold off on inhaled therapy for now    Current Outpatient Prescriptions:  .  atorvastatin (LIPITOR) 80 MG tablet, Take 1 tablet (80 mg total) by mouth daily at 6 PM., Disp: 30 tablet, Rfl: 6 .  Biotin 5000 MCG CAPS, Take by mouth daily., Disp: , Rfl:  .  celecoxib (CELEBREX) 50 MG capsule, Take 50 mg by mouth daily as needed for pain (Hand inflammation). Reported on 11/19/2015, Disp: , Rfl:  .  Cholecalciferol (VITAMIN D3) 2000 UNITS TABS, Take by mouth daily., Disp: , Rfl:  .  clopidogrel (PLAVIX) 75 MG tablet, Take 1 tablet (75 mg total) by mouth daily with breakfast., Disp: 30 tablet, Rfl: 6 .  Multiple Vitamin (MULTIVITAMIN) tablet, Take 1 tablet by mouth daily., Disp: , Rfl:  .  nitroGLYCERIN (NITROSTAT) 0.4 MG SL tablet, Place 1 tablet (0.4 mg total) under the tongue every 5 (five) minutes x 3 doses as needed for chest pain., Disp: 25 tablet, Rfl: 3 .  pantoprazole (PROTONIX) 40 MG tablet, Take 40 mg by mouth daily. , Disp: , Rfl:  .  rivaroxaban (XARELTO) 20 MG TABS tablet, Take 1 tablet (20 mg total) by mouth daily with supper., Disp: 30 tablet, Rfl: 6

## 2016-01-21 NOTE — Assessment & Plan Note (Signed)
He has a chronic cough which I think is multifactorial at best. Ongoing acid reflux contributes and is likely the primary cause followed closely by ongoing laryngeal irritation. I believe the laryngeal irritation causes cyclical cough syndrome or some people caught irritable larynx syndrome. Certainly his bronchiectasis may contribute but I think to a much lesser degree.  Plan: Take Pepcid in the evenings GERD lifestyle modification and specific dietary changes were recommended today Continue PPI in the morning Voice rest was strongly encouraged If no improvement then consider enrollment in a clinical trial for cough

## 2016-01-21 NOTE — Assessment & Plan Note (Signed)
Centrilobular emphysema as well as mucus plugging and chronic airway thickening was noted on the recent CT scan of the chest.  Plan: We'll need to obtain an alpha-1 antitrypsin test on the next visit Hold off on inhaled therapy for now

## 2016-01-21 NOTE — Assessment & Plan Note (Signed)
Personal review of his CT chest images shows significant mucus plugging in the bases of his lungs as well as diffuse cylindrical bronchiectasis. These imaging findings are consistent with his clinical presentation of a severe respiratory infection as a young adult followed by multiple recurrent airway infections over the years.  At this point I see no indication for additional inhaled medicines unless he has recurrent exacerbations.  Patients who have bronchiectasis have an increased burden of bacterial colonization in the airways and in the event of a recurrent episode of bronchitis he would need to be treated with antibiotics sooner rather than later. Specifically, he would likely need treatment for Pseudomonas.  He is also at increased risk for AFB colonization and/or infection based on his bronchiectasis. This may explain some of the nodules that were seen on his CT that I think the majority of the nodules were explained by mucus impaction.  Plan summary Sputum culture for AFB, fungal, bacteria Use flutter valve in the event of an upper respiratory infection He will need an antibiotic in the event of an upper respiratory infection (bronchitis) Hold off on adding any further treatment at this time

## 2016-01-21 NOTE — Assessment & Plan Note (Signed)
Multiple scattered nodules were noted on the August 30 first 2017 CT scan. The radiologist has recommended a repeat image in 6-12 months. We'll plan on repeating it in 12 months given all the CT scans he said recently unless he and his family feel otherwise. Will discuss more on the next visit.

## 2016-01-21 NOTE — Patient Instructions (Signed)
  Provide [place] with a sample of your mucus  Take a Pepcid or Zantac in the evening after dinner in addition to the pantoprazole which you took earlier during the day  You need to try to suppress your cough to allow your larynx (voice box) to heal.  For three days don't talk, laugh, sing, or clear your throat. Do everything you can to suppress the cough during this time. Use hard candies (sugarless Jolly Ranchers) or non-mint or non-menthol containing cough drops during this time to soothe your throat.  Use a cough suppressant (Delsym or what I have prescribed you) around the clock during this time.  After three days, gradually increase the use of your voice and back off on the cough suppressants.  We will arrange a PFT in 03/2016   We will see you back in 4 weeks or sooner if needed

## 2016-01-21 NOTE — Assessment & Plan Note (Signed)
He has a very mild interstitial infiltrate in the periphery of his lungs which is nonspecific at this time. It is likely the explanation for his diminished DLCO on pulmonary function testing. Fortunately, the rest of his PFTs are essentially normal some hopeful that this is just a mild nonspecific abnormality which will not need further workup. However, the only way to know for certain what is causing this to be to perform an open lung biopsy. I think this is not necessary considering his clinical improvement in terms of dyspnea over the last few months.  Plan: Repeat pulmonary function testing in November 2017, if evidence of worsening disease been consider open lung biopsy

## 2016-01-22 ENCOUNTER — Other Ambulatory Visit: Payer: Medicare Other

## 2016-01-22 ENCOUNTER — Encounter (HOSPITAL_COMMUNITY): Payer: Medicare Other

## 2016-01-22 ENCOUNTER — Other Ambulatory Visit: Payer: Self-pay | Admitting: Pulmonary Disease

## 2016-01-22 DIAGNOSIS — J849 Interstitial pulmonary disease, unspecified: Secondary | ICD-10-CM

## 2016-01-22 NOTE — Telephone Encounter (Signed)
Called and spoke to pt. Informed pt of the results and recs per MR. Pt saw BQ on 01/21/16, pt pleased with discussion with BQ at [place] on 01/21/16, and does not have an further questions or concerns at this time.

## 2016-01-25 LAB — RESPIRATORY CULTURE OR RESPIRATORY AND SPUTUM CULTURE

## 2016-01-28 ENCOUNTER — Ambulatory Visit: Payer: Medicare Other | Admitting: Internal Medicine

## 2016-01-29 ENCOUNTER — Other Ambulatory Visit: Payer: Medicare Other

## 2016-01-29 DIAGNOSIS — J849 Interstitial pulmonary disease, unspecified: Secondary | ICD-10-CM

## 2016-02-01 ENCOUNTER — Telehealth: Payer: Self-pay | Admitting: Pulmonary Disease

## 2016-02-01 ENCOUNTER — Other Ambulatory Visit: Payer: Medicare Other

## 2016-02-01 NOTE — Telephone Encounter (Signed)
Spoke with pt. He is aware of results. Nothing further was needed. 

## 2016-02-01 NOTE — Addendum Note (Signed)
Encounter addended by: Athena Baltz D Nelson Julson on: 02/01/2016  4:59 PM<BR>    Actions taken: Flowsheet data copied forward, Flowsheet accepted, Visit Navigator Flowsheet section accepted

## 2016-02-01 NOTE — Telephone Encounter (Signed)
Pt returning calling.Troy Willis ° °

## 2016-02-01 NOTE — Telephone Encounter (Signed)
A,  Please let the patient know this showed a very common organism called pseduomonas. This doesn't need immediate treatment but will guide [place] if he becomes ill with bronchitis again.  Thanks,  B   Called and lmomtcb x 1 for the pt to review his lab results per BQ.

## 2016-02-03 NOTE — Addendum Note (Signed)
Encounter addended by: Goble Fudala D Prerna Harold on: 02/03/2016  3:09 PM<BR>    Actions taken: Visit Navigator Flowsheet section accepted

## 2016-02-03 NOTE — Addendum Note (Signed)
Encounter addended by: Connie Hilgert D Larz Mark on: 02/03/2016  4:05 PM<BR>    Actions taken: Flowsheet data copied forward, Flowsheet accepted, Visit Navigator Flowsheet section accepted

## 2016-02-04 LAB — RESPIRATORY CULTURE OR RESPIRATORY AND SPUTUM CULTURE

## 2016-02-08 NOTE — Addendum Note (Signed)
Encounter addended by: Jewel Baize, RD on: 02/08/2016 11:01 AM<BR>    Actions taken: Flowsheet data copied forward, Visit Navigator Flowsheet section accepted

## 2016-02-12 ENCOUNTER — Encounter (HOSPITAL_COMMUNITY): Payer: Self-pay | Admitting: *Deleted

## 2016-02-12 NOTE — Progress Notes (Signed)
Discharge Summary  Patient Details  Name: Troy Willis MRN: [id number] Date of Birth: 04/16/1946 Referring Provider:   Flowsheet Row CARDIAC REHAB PHASE II ORIENTATION from 09/29/2015 in [facility]  Referring Provider  Fransico Him, MD       Number of Visits: 59  Reason for Discharge:  Patient reached a stable level of exercise. Patient independent in their exercise.  Smoking History:  History  Smoking Status  . Never Smoker  Smokeless Tobacco  . Former Systems developer  . Types: Chew    Comment: occasional cigar in past; not regular smoker    Diagnosis:  No diagnosis found.  ADL UCSD:   Initial Exercise Prescription:   Discharge Exercise Prescription (Final Exercise Prescription Changes):     Exercise Prescription Changes - 12/23/15 1533      Exercise Review   Progression Yes     Response to Exercise   Blood Pressure (Admit) 112/70   Blood Pressure (Exercise) 138/80   Blood Pressure (Exit) 106/62   Heart Rate (Admit) 74 bpm   Heart Rate (Exercise) 142 bpm   Heart Rate (Exit) 70 bpm   Rating of Perceived Exertion (Exercise) 13   Comments THR increase per Dr. Radford Pax, 142 bpm   Duration Progress to 30 minutes of continuous aerobic without signs/symptoms of physical distress   Intensity THRR unchanged     Progression   Progression Continue to progress workloads to maintain intensity without signs/symptoms of physical distress.   Average METs 7.1     Resistance Training   Training Prescription Yes   Weight 8lbs   Reps 10-12     Interval Training   Interval Training No     Treadmill   MPH 3.8   Grade 8   Minutes 19   METs 8.1     NuStep   Level 7   Minutes 10   METs 6.1     Rower   Level 3   Watts 82   Minutes 10   METs 7     Home Exercise Plan   Plans to continue exercise at Home   Frequency Add 3 additional days to program exercise sessions.      Functional Capacity:     6 Minute Walk    Row Name  02/03/16 1453 02/03/16 1531 02/03/16 1539     6 Minute Walk   Phase (P)  Discharge  - Discharge   Distance (P)  2479 feet  - 2479 feet   Distance % Change  -  - 4.42 %   Walk Time (P)  6 minutes  - 6 minutes   # of Rest Breaks (P)  0 0  -   MPH (P)  4.7 4.7  -   METS (P)  5.7 5.7  -   RPE (P)  13 13  -   VO2 Peak (P)  19.87 19.87  -   Symptoms (P)  No No  -   Resting HR (P)  67 bpm 67 bpm  -   Resting BP (P)  122/60 122/60  -   Max Ex. HR (P)  137 bpm 137 bpm  -   Max Ex. BP (P)  116/72 116/72  -   2 Minute Post BP  - 106/62  -      Psychological, QOL, Others - Outcomes: PHQ 2/9: Depression screen [facility] 2/9 12/23/2015 10/05/2015 01/22/2015 05/14/2014  Decreased Interest 0 0 0 0  Down, Depressed, Hopeless 0 0 0 0  PHQ - 2 Score 0 0 0 0    Quality of Life:     Quality of Life - 12/23/15 0832      Quality of Life Scores   Health/Function Post 21.13 %   Socioeconomic Post 25.93 %   Psych/Spiritual Post 21 %   Family Post 27.6 %   GLOBAL Post 23.04 %      Personal Goals: Goals established at orientation with interventions provided to work toward goal.    Personal Goals Discharge:   Nutrition & Weight - Outcomes:      Post Biometrics - 02/03/16 1537       Post  Biometrics   Waist Circumference 32.5 inches   Hip Circumference 39.5 inches   Waist to Hip Ratio 0.82 %   Triceps Skinfold 15 mm   % Body Fat 21.3 %   Grip Strength 47 kg   Flexibility 10.5 in   Single Leg Stand 30 seconds      Nutrition:   Nutrition Discharge:     Nutrition Assessments - 02/08/16 1059      MEDFICTS Scores   Pre Score 54   Post Score --  nutrition survey not returned      Education Questionnaire Score:     Knowledge Questionnaire Score - 12/23/15 0831      Knowledge Questionnaire Score   Post Score 24/24      Goals reviewed with patient. Pt completed 36 exercise sessions on 12/23/2015. Cherre Huger, BSN

## 2016-02-25 ENCOUNTER — Other Ambulatory Visit: Payer: Medicare Other

## 2016-02-25 ENCOUNTER — Ambulatory Visit (INDEPENDENT_AMBULATORY_CARE_PROVIDER_SITE_OTHER): Payer: Medicare Other | Admitting: Pulmonary Disease

## 2016-02-25 ENCOUNTER — Encounter: Payer: Self-pay | Admitting: Pulmonary Disease

## 2016-02-25 VITALS — BP 114/62 | HR 70 | Ht 75.0 in | Wt 160.8 lb

## 2016-02-25 DIAGNOSIS — J432 Centrilobular emphysema: Secondary | ICD-10-CM

## 2016-02-25 DIAGNOSIS — R911 Solitary pulmonary nodule: Secondary | ICD-10-CM | POA: Diagnosis not present

## 2016-02-25 DIAGNOSIS — J471 Bronchiectasis with (acute) exacerbation: Secondary | ICD-10-CM

## 2016-02-25 DIAGNOSIS — J849 Interstitial pulmonary disease, unspecified: Secondary | ICD-10-CM | POA: Diagnosis not present

## 2016-02-25 MED ORDER — FLUTTER DEVI: 0 refills | Status: DC

## 2016-02-25 NOTE — Progress Notes (Signed)
Subjective:    Patient ID: Troy Willis, male    DOB: [birth year], 70 y.o.   MRN: [id number] \ Synopsis: Evaluated in 2017 by [facility] for an ill-defined interstitial lung disease and bronchiectasis which is felt to be related to a prior severe lung infection. He also has mild emphysema on CT chest and pulmonary nodules.  HPI Chief Complaint  Patient presents with  . Follow-up    pt states he is doing well, notes stable prod cough with brown to gray mucus.      He has been doing well.  He says that he coulnd't go three days wihtout talking but he is trying to rest his voice.  He Says he has been exercising regularly. He continues to produce yellow to brown mucus. He produces mucus on his own. He had a flu shot last visit.    Past Medical History:  Diagnosis Date  . Anemia   . Arthritis    hands, lower back  . CAD (coronary artery disease), native coronary artery    a. 08/2015 Cath/PCI: LM nl, LAD 41m, LCX small, nl, RCA 80p (4.0x16 Synergy DES),   . Cataracts, bilateral    surgery  . Chronic atrial fibrillation (HCC)    a. CHA2DS2VASc = 2 -->Xarelto started 08/2015.  [name] Depression   . Diverticulosis   . GERD (gastroesophageal reflux disease)   . Heart murmur   . Hilar adenopathy    a. 08/2015 CT Chest: mild bilat hilar adenopathy and mild soft tissue prominence in inf right hilum, left hilum. Also areas of soft tissue and low-density material filling LLL bronchial airways->? mucous vs mass.  [name] Hx of adenomatous colonic polyps 08/13/2015  . Hyperlipidemia   . Nonspecific elevation of levels of transaminase or lactic acid dehydrogenase (LDH)    PMH of ; ? due to Amiodarone   . Pneumonia, bacterial 05/18/2012  . Pulmonary nodules    a. 08/2015 CT Chest: reticulonodular opacities in RUL - largest 71mm - likely 2/2 inflammatory process.      Review of Systems  Constitutional: Negative for chills, fatigue and fever.  HENT: Negative for postnasal drip, rhinorrhea and  sinus pressure.   Respiratory: Positive for cough and shortness of breath. Negative for wheezing.   Cardiovascular: Negative for chest pain, palpitations and leg swelling.       Objective:   Physical Exam Vitals:   02/25/16 1039  BP: 114/62  Pulse: 70  SpO2: 96%  Weight: 160 lb 12.8 oz (72.9 kg)  Height: 6\' 3"  (1.905 m)  RA  Gen: well appearing HENT: OP clear, neck supple PULM: CTA B, normal percussion CV: Irreg irreg, no mgr, trace edema GI: BS+, soft, nontender Derm: no cyanosis or rash Psyche: normal mood and affect    September 2017 sputum culture Pseudomonas     Assessment & Plan:  Cough Slight improvement in cough (hack) but he continues to produce mucus which is consistent with his bronchiectasis. He was provided with the flutter valve today to use for mucociliary clearance, see below.  ILD (interstitial lung disease) ([place]) As described previously he has an ill-defined interstitial lung disease which is been seen on CT scanning of his chest. At this time were electing to observe alone. We'll plan on repeating a pulmonary function test in November. If there has been a dramatic change in his pulmonary function test parameters then we will consider an open lung biopsy.  Bronchiectasis without acute exacerbation ([place]) Likely due to a severe infection  many years ago. Notably, his daughter is an alpha-1 antitrypsin deficiency carrier so we will need to check an alpha-1 test on him today is that can be associated with bronchiectasis.  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  Plan summary: Provided with flutter valve today with instructions to use 4-5 breath 4-5 times a day when ill If he has a flareup (increased mucus production, shortness breath, fever, chills) despite use of the flutter valve for 3-4 days then he will need antibiotics to cover Pseudomonas with a treatment course of 10-14 days Check alpha-1 antitrypsin deficiency today.  Solitary  pulmonary nodule Repeat CT chest October 2018.  Centrilobular emphysema ([place]) As detailed above will check alpha-1 antitrypsin level  > 50% of today's 28 minute visit spent face to face  Current Outpatient Prescriptions:  .  atorvastatin (LIPITOR) 80 MG tablet, Take 1 tablet (80 mg total) by mouth daily at 6 PM., Disp: 30 tablet, Rfl: 6 .  Biotin 5000 MCG CAPS, Take by mouth daily., Disp: , Rfl:  .  celecoxib (CELEBREX) 50 MG capsule, Take 50 mg by mouth daily as needed for pain (Hand inflammation). Reported on 11/19/2015, Disp: , Rfl:  .  Cholecalciferol (VITAMIN D3) 2000 UNITS TABS, Take by mouth daily., Disp: , Rfl:  .  clopidogrel (PLAVIX) 75 MG tablet, Take 1 tablet (75 mg total) by mouth daily with breakfast., Disp: 30 tablet, Rfl: 6 .  Multiple Vitamin (MULTIVITAMIN) tablet, Take 1 tablet by mouth daily., Disp: , Rfl:  .  nitroGLYCERIN (NITROSTAT) 0.4 MG SL tablet, Place 1 tablet (0.4 mg total) under the tongue every 5 (five) minutes x 3 doses as needed for chest pain., Disp: 25 tablet, Rfl: 3 .  ranitidine (ZANTAC) 150 MG tablet, Take 150 mg by mouth as needed for heartburn., Disp: , Rfl:  .  rivaroxaban (XARELTO) 20 MG TABS tablet, Take 1 tablet (20 mg total) by mouth daily with supper., Disp: 30 tablet, Rfl: 6 .  Respiratory Therapy Supplies (FLUTTER) DEVI, Use as directed, Disp: 1 each, Rfl: 0

## 2016-02-25 NOTE — Assessment & Plan Note (Signed)
Slight improvement in cough (hack) but he continues to produce mucus which is consistent with his bronchiectasis. He was provided with the flutter valve today to use for mucociliary clearance, see below.

## 2016-02-25 NOTE — Assessment & Plan Note (Signed)
Likely due to a severe infection many years ago. Notably, his daughter is an alpha-1 antitrypsin deficiency carrier so we will need to check an alpha-1 test on him today is that can be associated with bronchiectasis.  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  Plan summary: Provided with flutter valve today with instructions to use 4-5 breath 4-5 times a day when ill If he has a flareup (increased mucus production, shortness breath, fever, chills) despite use of the flutter valve for 3-4 days then he will need antibiotics to cover Pseudomonas with a treatment course of 10-14 days Check alpha-1 antitrypsin deficiency today.

## 2016-02-25 NOTE — Assessment & Plan Note (Signed)
As detailed above will check alpha-1 antitrypsin level

## 2016-02-25 NOTE — Assessment & Plan Note (Signed)
As described previously he has an ill-defined interstitial lung disease which is been seen on CT scanning of his chest. At this time were electing to observe alone. We'll plan on repeating a pulmonary function test in November. If there has been a dramatic change in his pulmonary function test parameters then we will consider an open lung biopsy.

## 2016-02-25 NOTE — Assessment & Plan Note (Signed)
Repeat CT chest October 2018.

## 2016-02-25 NOTE — Patient Instructions (Signed)
We will call you with the results of your blood test from today We'll see you back in December after the lung function test Use the flutter valve 4-5 breath 4-5 times a day for the next few days. If this is helpful then you can continue using it on a regular basis (though twice a day is sufficient for routine use). If you have a cold with mucus production then used the flutter valve as described above for 3-4 days, if no improvement then call me and I will prescribe an antibiotic

## 2016-03-02 ENCOUNTER — Telehealth (HOSPITAL_COMMUNITY): Payer: Self-pay

## 2016-03-02 NOTE — Telephone Encounter (Signed)
-----   Message from Sueanne Margarita, MD sent at 11/18/2015  2:40 PM EDT ----- Regarding: RE: THR increase I am fine with the increase  Traci ----- Message -----    From: Lequita Halt    Sent: 11/18/2015  12:26 PM      To: Sueanne Margarita, MD Subject: THR increase                                   Patient has been in cardiac rehab approximately 2 months and is doing well. BP's WNL's, but HR's are beginning to exceed Target Heart Rate (THR) of  60-127 bpm (50%-80% of age predicted max HR). If no GXT is planned for the near future and MD agrees, request to increase THR to 90% of age predicted max HR at 157bpm.   Futures trader

## 2016-03-02 NOTE — Telephone Encounter (Signed)
-----   Message from Sueanne Margarita, MD sent at 12/18/2015 11:10 AM EDT ----- I am fine with that  Fransico Him ----- Message ----- From: Lequita Halt Sent: 12/18/2015   8:38 AM To: Sueanne Margarita, MD  Patient has been in cardiac rehab approximately months and doing is well. Pt has been averaging 6.8 METS and BP/HR has been in stabled with exercise. Pt is interested in doing strength training. If you feel this patient is appropriate to begin strength training please f/u with cardiac rehab staff.    Thank you,  Futures trader

## 2016-03-04 LAB — ALPHA-1 ANTITRYPSIN PHENOTYPE: A-1 Antitrypsin: 134 mg/dL (ref 83–199)

## 2016-03-10 ENCOUNTER — Ambulatory Visit: Payer: Medicare Other | Admitting: Internal Medicine

## 2016-03-10 LAB — AFB CULTURE WITH SMEAR (NOT AT ARMC): SOURCE: 0

## 2016-03-10 LAB — FUNGUS CULTURE W SMEAR

## 2016-03-18 LAB — AFB CULTURE WITH SMEAR (NOT AT ARMC)

## 2016-03-18 LAB — FUNGUS CULTURE W SMEAR

## 2016-03-24 ENCOUNTER — Ambulatory Visit (INDEPENDENT_AMBULATORY_CARE_PROVIDER_SITE_OTHER): Payer: Medicare Other | Admitting: Pulmonary Disease

## 2016-03-24 ENCOUNTER — Encounter: Payer: Self-pay | Admitting: Cardiology

## 2016-03-24 DIAGNOSIS — J849 Interstitial pulmonary disease, unspecified: Secondary | ICD-10-CM

## 2016-03-24 LAB — PULMONARY FUNCTION TEST
DL/VA % PRED: 74 %
DL/VA: 3.63 ml/min/mmHg/L
DLCO COR % PRED: 57 %
DLCO COR: 22.3 ml/min/mmHg
DLCO unc % pred: 48 %
DLCO unc: 19.03 ml/min/mmHg
FEF 25-75 POST: 2.72 L/s
FEF 25-75 Pre: 2.22 L/sec
FEF2575-%CHANGE-POST: 22 %
FEF2575-%PRED-PRE: 76 %
FEF2575-%Pred-Post: 94 %
FEV1-%CHANGE-POST: 3 %
FEV1-%Pred-Post: 80 %
FEV1-%Pred-Pre: 77 %
FEV1-Post: 3.12 L
FEV1-Pre: 3.01 L
FEV1FVC-%CHANGE-POST: 3 %
FEV1FVC-%PRED-PRE: 102 %
FEV6-%Change-Post: 0 %
FEV6-%PRED-PRE: 80 %
FEV6-%Pred-Post: 80 %
FEV6-Post: 4.01 L
FEV6-Pre: 3.99 L
FEV6FVC-%Change-Post: 0 %
FEV6FVC-%Pred-Post: 105 %
FEV6FVC-%Pred-Pre: 104 %
FVC-%Change-Post: 0 %
FVC-%PRED-POST: 76 %
FVC-%PRED-PRE: 76 %
FVC-POST: 4.01 L
FVC-PRE: 4 L
POST FEV6/FVC RATIO: 100 %
PRE FEV1/FVC RATIO: 75 %
PRE FEV6/FVC RATIO: 100 %
Post FEV1/FVC ratio: 78 %
RV % pred: 68 %
RV: 1.88 L
TLC % pred: 73 %
TLC: 5.91 L
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
## 2016-03-25 ENCOUNTER — Ambulatory Visit (INDEPENDENT_AMBULATORY_CARE_PROVIDER_SITE_OTHER): Payer: Medicare Other | Admitting: Cardiology

## 2016-03-25 ENCOUNTER — Encounter: Payer: Self-pay | Admitting: Cardiology

## 2016-03-25 VITALS — BP 112/62 | HR 59 | Ht 75.0 in | Wt 163.8 lb

## 2016-03-25 DIAGNOSIS — E7800 Pure hypercholesterolemia, unspecified: Secondary | ICD-10-CM | POA: Diagnosis not present

## 2016-03-25 DIAGNOSIS — I2510 Atherosclerotic heart disease of native coronary artery without angina pectoris: Secondary | ICD-10-CM | POA: Diagnosis not present

## 2016-03-25 DIAGNOSIS — I482 Chronic atrial fibrillation: Secondary | ICD-10-CM

## 2016-03-25 DIAGNOSIS — I4821 Permanent atrial fibrillation: Secondary | ICD-10-CM

## 2016-03-25 NOTE — Patient Instructions (Signed)
Medication Instructions:  1) STOP PLAVIX  Labwork: You have a FASTING lab appointment Friday, November 17. You may come ANY TIME between 7:30AM and 5:00PM as long as you are FASTING.  Testing/Procedures: None  Follow-Up: Your physician wants you to follow-up in: 6 months with Dr. Radford Pax. You will receive a reminder letter in the mail two months in advance. If you don't receive a letter, please call our office to schedule the follow-up appointment.   Any Other Special Instructions Will Be Listed Below (If Applicable).     If you need a refill on your cardiac medications before your next appointment, please call your pharmacy.

## 2016-04-01 ENCOUNTER — Other Ambulatory Visit: Payer: Medicare Other | Admitting: *Deleted

## 2016-04-01 ENCOUNTER — Other Ambulatory Visit: Payer: Medicare Other

## 2016-04-01 ENCOUNTER — Other Ambulatory Visit: Payer: Self-pay | Admitting: Nurse Practitioner

## 2016-04-01 DIAGNOSIS — E7800 Pure hypercholesterolemia, unspecified: Secondary | ICD-10-CM

## 2016-04-01 DIAGNOSIS — I4821 Permanent atrial fibrillation: Secondary | ICD-10-CM

## 2016-04-01 LAB — BASIC METABOLIC PANEL
BUN: 19 mg/dL (ref 7–25)
CALCIUM: 8.8 mg/dL (ref 8.6–10.3)
CO2: 29 mmol/L (ref 20–31)
Chloride: 106 mmol/L (ref 98–110)
Creat: 0.86 mg/dL (ref 0.70–1.18)
Glucose, Bld: 93 mg/dL (ref 65–99)
POTASSIUM: 4.5 mmol/L (ref 3.5–5.3)
SODIUM: 140 mmol/L (ref 135–146)

## 2016-04-01 LAB — CBC WITH DIFFERENTIAL/PLATELET
BASOS ABS: 0 {cells}/uL (ref 0–200)
Basophils Relative: 0 %
EOS PCT: 1 %
Eosinophils Absolute: 49 cells/uL (ref 15–500)
HCT: 37.8 % — ABNORMAL LOW (ref 38.5–50.0)
HEMOGLOBIN: 12.5 g/dL — AB (ref 13.2–17.1)
LYMPHS ABS: 1421 {cells}/uL (ref 850–3900)
Lymphocytes Relative: 29 %
MCH: 34.2 pg — ABNORMAL HIGH (ref 27.0–33.0)
MCHC: 33.1 g/dL (ref 32.0–36.0)
MCV: 103.3 fL — ABNORMAL HIGH (ref 80.0–100.0)
MPV: 9.6 fL (ref 7.5–12.5)
Monocytes Absolute: 392 cells/uL (ref 200–950)
Monocytes Relative: 8 %
NEUTROS ABS: 3038 {cells}/uL (ref 1500–7800)
Neutrophils Relative %: 62 %
Platelets: 198 10*3/uL (ref 140–400)
RBC: 3.66 MIL/uL — AB (ref 4.20–5.80)
RDW: 13.8 % (ref 11.0–15.0)
WBC: 4.9 10*3/uL (ref 3.8–10.8)

## 2016-04-01 LAB — LIPID PANEL
CHOL/HDL RATIO: 2.1 ratio (ref <5.0)
CHOLESTEROL: 130 mg/dL (ref <200)
HDL: 62 mg/dL (ref >40)
LDL CALC: 59 mg/dL (ref <100)
Triglycerides: 44 mg/dL (ref <150)
VLDL: 9 mg/dL (ref <30)

## 2016-04-01 LAB — HEPATIC FUNCTION PANEL
ALK PHOS: 53 U/L (ref 40–115)
ALT: 26 U/L (ref 9–46)
AST: 33 U/L (ref 10–35)
Albumin: 3.9 g/dL (ref 3.6–5.1)
BILIRUBIN DIRECT: 0.1 mg/dL (ref <=0.2)
BILIRUBIN TOTAL: 0.4 mg/dL (ref 0.2–1.2)
Indirect Bilirubin: 0.3 mg/dL (ref 0.2–1.2)
Total Protein: 7 g/dL (ref 6.1–8.1)

## 2016-04-05 ENCOUNTER — Telehealth: Payer: Self-pay | Admitting: Pulmonary Disease

## 2016-04-05 NOTE — Telephone Encounter (Signed)
-----   Message from Debbora Dus sent at 03/31/2016  4:07 PM EST ----- Hello!!  Pt states he had a blood test with Dr. Lake Bells that showed a low iron level. He is coming to our office in the am to have labs and wants to know if your office would like to have it repeated? If so can an order be put in and we will draw it?  Thank you!!  Lorenda Hatchet

## 2016-04-05 NOTE — Telephone Encounter (Signed)
BQ please advise if we need to have the pt repeat any labs.  Looks like this message was sent over on 11/16--it looks like the labs in the computer are from 11/17.  Pt does have a pending appt with you on 12/7.  Please advise. thanks

## 2016-04-05 NOTE — Telephone Encounter (Signed)
I'm not sure if this was taken care of.

## 2016-04-06 ENCOUNTER — Encounter: Payer: Self-pay | Admitting: Pulmonary Disease

## 2016-04-06 NOTE — Telephone Encounter (Signed)
BQ, please advise on PFT results. Thanks .

## 2016-04-06 NOTE — Telephone Encounter (Signed)
Noted. Nothing further needed. Will sign off.  

## 2016-04-06 NOTE — Telephone Encounter (Signed)
Don't need to repeat labs

## 2016-04-21 ENCOUNTER — Ambulatory Visit (INDEPENDENT_AMBULATORY_CARE_PROVIDER_SITE_OTHER): Payer: Medicare Other | Admitting: Pulmonary Disease

## 2016-04-21 ENCOUNTER — Ambulatory Visit: Payer: Medicare Other | Admitting: Pulmonary Disease

## 2016-04-21 ENCOUNTER — Encounter: Payer: Self-pay | Admitting: Pulmonary Disease

## 2016-04-21 VITALS — BP 110/70 | HR 55 | Ht 75.0 in | Wt 166.2 lb

## 2016-04-21 DIAGNOSIS — R911 Solitary pulmonary nodule: Secondary | ICD-10-CM

## 2016-04-21 DIAGNOSIS — J849 Interstitial pulmonary disease, unspecified: Secondary | ICD-10-CM | POA: Diagnosis not present

## 2016-04-21 DIAGNOSIS — J479 Bronchiectasis, uncomplicated: Secondary | ICD-10-CM

## 2016-04-21 NOTE — Progress Notes (Signed)
Subjective:    Patient ID: Troy Willis, male    DOB: [birth year], 70 y.o.   MRN: [id number] \ Synopsis: Evaluated in 2017 by [facility] for an ill-defined interstitial lung disease and bronchiectasis which is felt to be related to a prior severe lung infection. He also has mild emphysema on CT chest and pulmonary nodules. Alpha 1 testing 02/2016 > M/MAugust 2017 CT chest showed findings suggestive of nonspecific and distal pneumonitis with cylindrical bronchiectasis, mucous plugging and mild paraseptal and centrilobular emphysema. Three-vessel coronary disease noted. Stable pulmonary nodules. He has known coronary artery disease and a history of a stent.   Upper endoscopy in 2013 showed evidence of esophagitis  HPI Chief Complaint  Patient presents with  . Follow-up    PFT results. Breathing has been improving over the last few months. SOB at first when excerising but quickly goes away.     [name] says he feels about the same as he did the last visit. He says that he still coughs from time to time which will happen when he eats.  He says that sometimes food gets hung up in his throat.  Dust or mold will make him cough a lot.   He isn't bringing up mucus. He has been using the flutter valves.  He did not produce more mucus.   He saw his ENT doctor Janace Hoard) who repeated a laryngoscopy in the office which was reportedly normal.  However he says that he may have GERD or silent reflux/aspiration.  He was told that he should take protonix because of this.     Past Medical History:  Diagnosis Date  . Anemia   . Arthritis    hands, lower back  . CAD (coronary artery disease), native coronary artery    a. 08/2015 Cath/PCI: LM nl, LAD 84m, LCX small, nl, RCA 80p (4.0x16 Synergy DES),   . Cataracts, bilateral    surgery  . Depression   . Diverticulosis   . GERD (gastroesophageal reflux disease)   . Heart murmur   . Hilar adenopathy    a. 08/2015 CT Chest: mild bilat hilar  adenopathy and mild soft tissue prominence in inf right hilum, left hilum. Also areas of soft tissue and low-density material filling LLL bronchial airways->? mucous vs mass.  [name] Hx of adenomatous colonic polyps 08/13/2015  . Hyperlipidemia   . Nonspecific elevation of levels of transaminase or lactic acid dehydrogenase (LDH)    PMH of ; ? due to Amiodarone   . Permanent atrial fibrillation (HCC)    a. CHA2DS2VASc = 2 -->Xarelto started 08/2015.  [name] Pneumonia, bacterial 05/18/2012  . Pulmonary nodules    a. 08/2015 CT Chest: reticulonodular opacities in RUL - largest 17mm - likely 2/2 inflammatory process.      Review of Systems  Constitutional: Negative for chills, fatigue and fever.  HENT: Negative for postnasal drip, rhinorrhea and sinus pressure.   Respiratory: Positive for cough and shortness of breath. Negative for wheezing.   Cardiovascular: Negative for chest pain, palpitations and leg swelling.       Objective:   Physical Exam Vitals:   04/21/16 1102  BP: 110/70  Pulse: (!) 55  SpO2: 95%  Weight: 166 lb 3.2 oz (75.4 kg)  Height: 6\' 3"  (1.905 m)  RA O2 saturation 95%, not 55%  Gen: well appearing HENT: OP clear, neck supple PULM: CTA B, normal percussion CV: Irreg irreg, no mgr, trace edema GI: BS+, soft, nontender Derm: no cyanosis or rash Psyche:  normal mood and affect   November 2017 pulmonary function testing FVC 4.0 L 76% predicted, no airflow obstruction, total lung capacity 5.91 L 73% predicted, DLCO 22.30  Prior endoscopy results reviewed from 2013 showing esophagitis.     Assessment & Plan:  ILD (interstitial lung disease) ([place]) He has mild fibrotic changes seen in addition to bronchiectasis on his CT scan. We do not have a clear diagnosis the only way to do that would be to perform an open lung biopsy which I do not feel is necessary considering his overall mild symptoms.  In my mind the differential diagnosis is between silent recurrent aspiration versus  less likely nonspecific interstitial pneumonitis.  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  Plan: Counseled once again on the importance of adherence to pulmonary toilet measures with flutter valve use Call us in the event of an infection Flu shot is up-to-date  Solitary pulmonary nodule Stable Repeat CT chest 12/2016  > 50% of today's 26 minute visit spent face to face  Current Outpatient Prescriptions:  .  atorvastatin (LIPITOR) 80 MG tablet, TAKE 1 TABLET BY MOUTH DAILY AT 6 PM., Disp: 30 tablet, Rfl: 11 .  Biotin 5000 MCG CAPS, Take by mouth daily., Disp: , Rfl:  .  Cholecalciferol (VITAMIN D3) 2000 UNITS TABS, Take by mouth daily., Disp: , Rfl:  .  Multiple Vitamin (MULTIVITAMIN) tablet, Take 1 tablet by mouth daily., Disp: , Rfl:  .  nitroGLYCERIN (NITROSTAT) 0.4 MG SL tablet, Place 1 tablet (0.4 mg total) under the tongue every 5 (five) minutes x 3 doses as needed for chest pain., Disp: 25 tablet, Rfl: 3 .  ranitidine (ZANTAC) 150 MG tablet, Take 150 mg by mouth as needed for heartburn., Disp: , Rfl:  .  Respiratory Therapy Supplies (FLUTTER) DEVI, Use as directed, Disp: 1 each, Rfl: 0 .  XARELTO 20 MG TABS tablet, TAKE 1 TABLET BY MOUTH DAILY WITH SUPPER., Disp: 30 tablet, Rfl: 11

## 2016-04-21 NOTE — Assessment & Plan Note (Signed)
Stable Repeat CT chest 12/2016

## 2016-04-21 NOTE — Assessment & Plan Note (Signed)
He has mild fibrotic changes seen in addition to bronchiectasis on his CT scan. We do not have a clear diagnosis the only way to do that would be to perform an open lung biopsy which I do not feel is necessary considering his overall mild symptoms.  In my mind the differential diagnosis is between silent recurrent aspiration versus less likely nonspecific interstitial pneumonitis.  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

## 2016-04-21 NOTE — Assessment & Plan Note (Signed)
Stable interval without exacerbation.  Plan: Counseled once again on the importance of adherence to pulmonary toilet measures with flutter valve use Call us in the event of an infection Flu shot is up-to-date

## 2016-04-21 NOTE — Patient Instructions (Signed)
In the event of a respiratory infection: Call us right away Use her flutter valve 4-5 breaths, 4-5 times a day  We will arrange for repeat lung function test in May 2018  I want you to follow-up with Dr. Carlean Purl with gastroenterology to discuss the possibility of silent aspiration.  I will see you back in May or sooner if needed

## 2016-04-28 ENCOUNTER — Telehealth: Payer: Self-pay | Admitting: Pulmonary Disease

## 2016-04-28 MED ORDER — LEVOFLOXACIN 750 MG PO TABS: 750.0000 mg | ORAL_TABLET | Freq: Every day | ORAL | 0 refills | Status: DC

## 2016-04-28 NOTE — Telephone Encounter (Signed)
Pt c/o increased cough and chest congestion x 2 days.  Pt had an unproductive cough on day 1 and today his cough turned productive with light to dark brown- no blood Pt very hoarse, denies sore throat.  Some chest tightness, coughing with deep breaths.  Decreased energy.  Feverish/chills - pt has not taken temp.  Requesting an OV if BQ feels needed or something to be called into pharmacy.  Please advise [name]. Thanks.  [facility].

## 2016-04-28 NOTE — Telephone Encounter (Signed)
Use flutter valve regularly (4-5 breaths, 4-5 times a day) Take Levaquin 750mg  daily x 7 days Take yogurt or probiotic on days when he has a flare up Call if no improvement

## 2016-04-28 NOTE — Telephone Encounter (Signed)
Called and spoke with pt and he is aware of BQ recs.  Med has been sent to the pharmacy and nothing further is needed.

## 2016-05-06 ENCOUNTER — Encounter: Payer: Self-pay | Admitting: Pulmonary Disease

## 2016-05-13 ENCOUNTER — Encounter: Payer: Self-pay | Admitting: Internal Medicine

## 2016-05-13 ENCOUNTER — Ambulatory Visit (INDEPENDENT_AMBULATORY_CARE_PROVIDER_SITE_OTHER): Payer: Medicare Other | Admitting: Internal Medicine

## 2016-05-13 VITALS — BP 100/64 | HR 72 | Ht 75.0 in | Wt 168.0 lb

## 2016-05-13 DIAGNOSIS — R05 Cough: Secondary | ICD-10-CM

## 2016-05-13 DIAGNOSIS — R1319 Other dysphagia: Secondary | ICD-10-CM

## 2016-05-13 DIAGNOSIS — R1310 Dysphagia, unspecified: Secondary | ICD-10-CM | POA: Diagnosis not present

## 2016-05-13 DIAGNOSIS — R059 Cough, unspecified: Secondary | ICD-10-CM

## 2016-05-13 NOTE — Progress Notes (Signed)
   Troy Willis 70 y.o. 1945-07-16 [id number]  Assessment & Plan:   Encounter Diagnoses  Name Primary?  . Cough Yes  . Esophageal dysphagia     I think the rare dysphagia could be from aortic arch impression on esophagus +/- some dysmotility. 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  I am not sure why he coughs after he eats at times - he thinks ranitidine prn helps this but does not think hoarseness was helped by chronic bid PPI so stopped and is not interested in taking medication. He and I think EGD likely low yield and he is not inclined to have one. 2013 showed slight mid esophageal inflammation ? cause  I told him I thought a modified barium swallow might shed some light on things but his sxs of occasional coughing after a meal do not suggest aspiration as would expect that to be associated with coughing during drinking or eating. He will think about modified barium swallow and this could be scheduled at anytime by me or other MD's  Other options would be pH probe either attached ([place] - would have to come off anti-coag tx) or intranasal. He is not inclined to do those either.  I appreciate the opportunity to care for this patient. [id number] Troy Riddle, MD Roselie Awkward, MD     Subjective:   Chief Complaint: cough  HPI Pleasant elderly wm w/ chronic cough and hoarseness and also has interstitial lung disease. He has seen ENT w/ negative laryngoscopy (? Pharyngeal lesion on ba swallow) and last saw me in July 2017, was on PPI and thought it helped reflux and hoarseness. Over time he thought maybe not helping and stopped. Ba swallow w/ tiny cervical web, ? Posterior pharyngeal lesion (none on ENT direct visual exam) and slight dysmotility. 13 mm tablet swallowed well. Was some aortic arch impression.  Here for f/u - Dr. Lake Bells wondered if repeating an EGD would be needed.  Medications, allergies, past  medical history, past surgical history, family history and social history are reviewed and updated in the EMR.  Review of Systems As above  Objective:   Physical Exam BP 100/64   Pulse 72   Ht 6\' 3"  (1.905 m)   Wt 168 lb (76.2 kg)   BMI 21.00 kg/m  NAD  15 minutes time spent with patient > half in counseling coordination of care

## 2016-05-13 NOTE — Patient Instructions (Signed)
   Please follow up with Dr Gessner as needed.   I appreciate the opportunity to care for you. Carl Gessner, MD, FACG 

## 2016-05-18 DIAGNOSIS — C44319 Basal cell carcinoma of skin of other parts of face: Secondary | ICD-10-CM | POA: Diagnosis not present

## 2016-07-21 DIAGNOSIS — L2081 Atopic neurodermatitis: Secondary | ICD-10-CM | POA: Diagnosis not present

## 2016-07-21 DIAGNOSIS — Z85828 Personal history of other malignant neoplasm of skin: Secondary | ICD-10-CM | POA: Diagnosis not present

## 2016-07-21 DIAGNOSIS — L820 Inflamed seborrheic keratosis: Secondary | ICD-10-CM | POA: Diagnosis not present

## 2016-08-17 ENCOUNTER — Telehealth: Payer: Self-pay | Admitting: Pulmonary Disease

## 2016-08-17 NOTE — Telephone Encounter (Signed)
Pt is traveling out of the country for a couple weeks, requesting an abx to take with him as a precaution.  Pt states the last time he traveled we gave him levaquin.  Pt is flying out out 08/26/16.    Pt uses [employer].  Aware that BQ is out of the office until Monday, ok with waiting for his recs.    BQ please advise.  Thanks!

## 2016-08-21 NOTE — Telephone Encounter (Signed)
OK to give levaquin 500mg  po daily x 7 days

## 2016-08-22 MED ORDER — LEVOFLOXACIN 500 MG PO TABS: 500.0000 mg | ORAL_TABLET | Freq: Every day | ORAL | 0 refills | Status: DC

## 2016-08-22 NOTE — Telephone Encounter (Signed)
Called and spoke with pt and he is aware of rx for the levaquin that has been sent to his pharmacy.  Nothing further is needed.

## 2016-09-23 ENCOUNTER — Ambulatory Visit (INDEPENDENT_AMBULATORY_CARE_PROVIDER_SITE_OTHER): Payer: PPO | Admitting: Cardiology

## 2016-09-23 ENCOUNTER — Encounter: Payer: Self-pay | Admitting: Cardiology

## 2016-09-23 VITALS — BP 100/62 | HR 61 | Resp 16 | Ht 75.0 in | Wt 170.0 lb

## 2016-09-23 DIAGNOSIS — I4821 Permanent atrial fibrillation: Secondary | ICD-10-CM

## 2016-09-23 DIAGNOSIS — I25118 Atherosclerotic heart disease of native coronary artery with other forms of angina pectoris: Secondary | ICD-10-CM | POA: Diagnosis not present

## 2016-09-23 DIAGNOSIS — R252 Cramp and spasm: Secondary | ICD-10-CM

## 2016-09-23 DIAGNOSIS — I482 Chronic atrial fibrillation: Secondary | ICD-10-CM | POA: Diagnosis not present

## 2016-09-23 DIAGNOSIS — E7800 Pure hypercholesterolemia, unspecified: Secondary | ICD-10-CM
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
## 2016-09-23 NOTE — Patient Instructions (Signed)
Medication Instructions:  Your physician recommends that you continue on your current medications as directed. Please refer to the Current Medication list given to you today.   Labwork: None  Testing/Procedures: Your physician has requested that you have a lower extremity arterial duplex. During this test, ultrasound is used to evaluate arterial blood flow in the legs. Allow one hour for this exam. There are no restrictions or special instructions.   Follow-Up: Your physician wants you to follow-up in: 6 months with Dr. Radford Pax. You will receive a reminder letter in the mail two months in advance. If you don't receive a letter, please call our office to schedule the follow-up appointment.   Any Other Special Instructions Will Be Listed Below (If Applicable).     If you need a refill on your cardiac medications before your next appointment, please call your pharmacy.

## 2016-10-03 ENCOUNTER — Telehealth: Payer: Self-pay

## 2016-10-03 DIAGNOSIS — E785 Hyperlipidemia, unspecified: Secondary | ICD-10-CM

## 2016-10-03 NOTE — Telephone Encounter (Signed)
BMET, ALT and FLP scheduled this Friday, May 25. Patient agrees with treatment plan and understands to come fasting for blood draw.

## 2016-10-03 NOTE — Telephone Encounter (Signed)
-----   Message from Sueanne Margarita, MD sent at 09/29/2016 12:16 PM EDT ----- Those labs are from last fall.  Please have him come in for FLP , ALT and BMET  Traci ----- Message ----- From: Theodoro Parma, RN Sent: 09/29/2016  12:05 PM To: Sueanne Margarita, MD  You requested most recent PCP labs. His PCP is [facility] so most recent are in our system. Thanks!

## 2016-10-05 ENCOUNTER — Other Ambulatory Visit: Payer: Self-pay | Admitting: Cardiology

## 2016-10-05 DIAGNOSIS — R0989 Other specified symptoms and signs involving the circulatory and respiratory systems: Secondary | ICD-10-CM

## 2016-10-07 ENCOUNTER — Encounter: Payer: Self-pay | Admitting: Pulmonary Disease

## 2016-10-07 ENCOUNTER — Other Ambulatory Visit: Payer: PPO | Admitting: *Deleted

## 2016-10-07 ENCOUNTER — Telehealth: Payer: Self-pay | Admitting: Cardiology

## 2016-10-07 ENCOUNTER — Ambulatory Visit (INDEPENDENT_AMBULATORY_CARE_PROVIDER_SITE_OTHER): Payer: PPO | Admitting: Pulmonary Disease

## 2016-10-07 ENCOUNTER — Ambulatory Visit (INDEPENDENT_AMBULATORY_CARE_PROVIDER_SITE_OTHER): Payer: PPO | Admitting: *Deleted

## 2016-10-07 VITALS — BP 116/66 | HR 67 | Ht 75.0 in | Wt 175.0 lb

## 2016-10-07 DIAGNOSIS — R911 Solitary pulmonary nodule: Secondary | ICD-10-CM | POA: Diagnosis not present

## 2016-10-07 DIAGNOSIS — D129 Benign neoplasm of anus and anal canal: Secondary | ICD-10-CM | POA: Diagnosis not present

## 2016-10-07 DIAGNOSIS — E785 Hyperlipidemia, unspecified: Secondary | ICD-10-CM | POA: Diagnosis not present

## 2016-10-07 DIAGNOSIS — J479 Bronchiectasis, uncomplicated: Secondary | ICD-10-CM | POA: Diagnosis not present

## 2016-10-07 DIAGNOSIS — J849 Interstitial pulmonary disease, unspecified: Secondary | ICD-10-CM

## 2016-10-07 DIAGNOSIS — Z125 Encounter for screening for malignant neoplasm of prostate: Secondary | ICD-10-CM | POA: Diagnosis not present

## 2016-10-07 DIAGNOSIS — Z129 Encounter for screening for malignant neoplasm, site unspecified: Secondary | ICD-10-CM

## 2016-10-07 LAB — BASIC METABOLIC PANEL
BUN/Creatinine Ratio: 20 (ref 10–24)
BUN: 18 mg/dL (ref 8–27)
CO2: 25 mmol/L (ref 18–29)
Calcium: 9.1 mg/dL (ref 8.6–10.2)
Chloride: 101 mmol/L (ref 96–106)
Creatinine, Ser: 0.89 mg/dL (ref 0.76–1.27)
GFR calc Af Amer: 99 mL/min/{1.73_m2} (ref >59)
GFR calc non Af Amer: 86 mL/min/{1.73_m2} (ref >59)
Glucose: 98 mg/dL (ref 65–99)
POTASSIUM: 4.7 mmol/L (ref 3.5–5.2)
SODIUM: 139 mmol/L (ref 134–144)

## 2016-10-07 LAB — PULMONARY FUNCTION TEST
DL/VA % pred: 64 %
DL/VA: 3.12 ml/min/mmHg/L
DLCO UNC: 17.9 ml/min/mmHg
DLCO cor % pred: 47 %
DLCO cor: 18.62 ml/min/mmHg
DLCO unc % pred: 45 %
FEF 25-75 Post: 2.27 L/sec
FEF 25-75 Pre: 1.88 L/sec
FEF2575-%Change-Post: 20 %
FEF2575-%Pred-Post: 79 %
FEF2575-%Pred-Pre: 65 %
FEV1-%CHANGE-POST: 5 %
FEV1-%PRED-POST: 79 %
FEV1-%Pred-Pre: 75 %
FEV1-POST: 3.04 L
FEV1-PRE: 2.89 L
FEV1FVC-%Change-Post: 0 %
FEV1FVC-%Pred-Pre: 98 %
FEV6-%Change-Post: 4 %
FEV6-%PRED-POST: 84 %
FEV6-%Pred-Pre: 80 %
FEV6-POST: 4.19 L
FEV6-PRE: 4.01 L
FEV6FVC-%CHANGE-POST: 0 %
FEV6FVC-%PRED-POST: 103 %
FEV6FVC-%PRED-PRE: 104 %
FVC-%CHANGE-POST: 5 %
FVC-%Pred-Post: 81 %
FVC-%Pred-Pre: 77 %
FVC-Post: 4.25 L
FVC-Pre: 4.04 L
PRE FEV6/FVC RATIO: 99 %
Post FEV1/FVC ratio: 72 %
Post FEV6/FVC ratio: 99 %
Pre FEV1/FVC ratio: 72 %
RV % PRED: 78 %
RV: 2.17 L
TLC % PRED: 78 %
TLC: 6.3 L

## 2016-10-07 LAB — LIPID PANEL
CHOLESTEROL TOTAL: 119 mg/dL (ref 100–199)
Chol/HDL Ratio: 1.9 ratio (ref 0.0–5.0)
HDL: 64 mg/dL (ref >39)
LDL Calculated: 48 mg/dL (ref 0–99)
Triglycerides: 33 mg/dL (ref 0–149)
VLDL CHOLESTEROL CAL: 7 mg/dL (ref 5–40)

## 2016-10-07 LAB — ALT: ALT: 27 IU/L (ref 0–44)

## 2016-10-07 NOTE — Assessment & Plan Note (Signed)
Plan follow-up CT scan of the chest and August 2018

## 2016-10-07 NOTE — Assessment & Plan Note (Signed)
Stable interval see detailed plan above. Continue gastroesophageal reflux lifestyle modification. Use flutter valve in the event of bronchitis. If no improvement with 2 days of flutter valve use then call us for antibiotics.

## 2016-10-07 NOTE — Assessment & Plan Note (Signed)
Ill-defined interstitial lung disease felt to be due to chronic aspiration. Also with bronchiectasis which is likely due to chronic aspiration but related to a severe prior episode of bronchitis in the 1960s.  His lung function testing is stable with the exception of a slight decrease in his diffusion capacity. Considering his overall clinical stability I feel strongly that he has not had progression of disease. However, in the absence of a biopsy to give [place] a definitive diagnosis I think he still needs careful monitoring.  Plan: 6 walk test today to monitor O2 saturation If that test is abnormal we will arrange for a high-resolution CT scan sooner than August Continue plans for every 6 month on her function testing

## 2016-10-07 NOTE — Progress Notes (Signed)
Subjective:    Patient ID: Troy Willis, male    DOB: Jan 04, 1946, 71 y.o.   MRN: [id number] \ Synopsis: Evaluated in 2017 by [facility] for an ill-defined interstitial lung disease and bronchiectasis which is felt to be related to a prior severe lung infection. He also has mild emphysema on CT chest and pulmonary nodules.  He had a severe  Alpha 1 testing 02/2016 > M/MAugust 2017 CT chest showed findings suggestive of nonspecific and distal pneumonitis with cylindrical bronchiectasis, mucous plugging and mild paraseptal and centrilobular emphysema. Three-vessel coronary disease noted. Stable pulmonary nodules. He has known coronary artery disease and a history of a stent.   Upper endoscopy in 2013 showed evidence of esophagitis  HPI Chief Complaint  Patient presents with  . Follow-up    review PFT.  pt c/o cough, throat clearing.      Armoni hasn't had a flare up of his bronchiectasis since the last visit.  He reports no dyspnea other than when he is exerting himself heavily.   He has not been back to see GI medicine in a while. His last upper GI was in 12/2015. Food is going down OK, not betting stuck.   Sometimes pills get stuck.   He has some GERD symptoms.  He feels that he has acid reflux of stomach contents.  He doesn't feel that food goes down the wrong pipe.  These symptoms have improved somewhat with lifestyle modification.   He has not needed the flutter valve.   Past Medical History:  Diagnosis Date  . Anemia   . Arthritis    hands, lower back  . CAD (coronary artery disease), native coronary artery    a. 08/2015 Cath/PCI: LM nl, LAD 46m, LCX small, nl, RCA 80p (4.0x16 Synergy DES),   . Cataracts, bilateral    surgery  . Depression   . Diverticulosis   . GERD (gastroesophageal reflux disease)   . Heart murmur   . Hilar adenopathy    a. 08/2015 CT Chest: mild bilat hilar adenopathy and mild soft tissue prominence in inf right hilum, left hilum. Also areas  of soft tissue and low-density material filling LLL bronchial airways->? mucous vs mass.  [name] Hx of adenomatous colonic polyps 08/13/2015  . Hyperlipidemia   . Nonspecific elevation of levels of transaminase or lactic acid dehydrogenase (LDH)    PMH of ; ? due to Amiodarone   . Permanent atrial fibrillation (HCC)    a. CHA2DS2VASc = 2 -->Xarelto started 08/2015.  [name] Pneumonia, bacterial 05/18/2012  . Pulmonary nodules    a. 08/2015 CT Chest: reticulonodular opacities in RUL - largest 4mm - likely 2/2 inflammatory process.      Review of Systems  Constitutional: Negative for chills, fatigue and fever.  HENT: Negative for postnasal drip, rhinorrhea and sinus pressure.   Respiratory: Positive for cough and shortness of breath. Negative for wheezing.   Cardiovascular: Negative for chest pain, palpitations and leg swelling.       Objective:   Physical Exam Vitals:   10/07/16 0959  BP: 116/66  Pulse: 67  SpO2: 94%  Weight: 175 lb (79.4 kg)  Height: 6\' 3"  (1.905 m)  RA O2 saturation 95%, not 55%  Gen: well appearing HENT: OP clear, TM's clear, neck supple PULM: Crackles bases B, normal percussion CV: Irreg irreg, no mgr, trace edema GI: BS+, soft, nontender Derm: no cyanosis or rash Psyche: normal mood and affect   PFT November 2017  FVC 4.0 L 76%  predicted, no airflow obstruction, total lung capacity 5.91 L 73% predicted, DLCO 22.12 Oct 2016 ratio normal, FEV1 3.04 L 79% predicted, FVC 4.25 L 81% protected, total lung capacity 6.30 L 78% productive, DLCO 17.90 45% predicted  Cardiology records reviewed were he was treated for A. fib hyperlipidemia and coronary artery disease earlier this month     Assessment & Plan:  Solitary pulmonary nodule Plan follow-up CT scan of the chest and August 2018  ILD (interstitial lung disease) ([place]) Ill-defined interstitial lung disease felt to be due to chronic aspiration. Also with bronchiectasis which is likely due to chronic aspiration but  related to a severe prior episode of bronchitis in the 1960s.  His lung function testing is stable with the exception of a slight decrease in his diffusion capacity. Considering his overall clinical stability I feel strongly that he has not had progression of disease. However, in the absence of a biopsy to give [place] a definitive diagnosis I think he still needs careful monitoring.  Plan: 6 walk test today to monitor O2 saturation If that test is abnormal we will arrange for a high-resolution CT scan sooner than August Continue plans for every 6 month on her function testing  Bronchiectasis without acute exacerbation ([place]) Stable interval see detailed plan above. Continue gastroesophageal reflux lifestyle modification. Use flutter valve in the event of bronchitis. If no improvement with 2 days of flutter valve use then call us for antibiotics.    Current Outpatient Prescriptions:  .  atorvastatin (LIPITOR) 80 MG tablet, TAKE 1 TABLET BY MOUTH DAILY AT 6 PM., Disp: 30 tablet, Rfl: 11 .  Biotin 5000 MCG CAPS, Take 1 capsule by mouth daily. , Disp: , Rfl:  .  Cholecalciferol (VITAMIN D3) 2000 UNITS TABS, Take 1 tablet by mouth daily. , Disp: , Rfl:  .  Multiple Vitamin (MULTIVITAMIN) tablet, Take 1 tablet by mouth daily., Disp: , Rfl:  .  nitroGLYCERIN (NITROSTAT) 0.4 MG SL tablet, Place 1 tablet (0.4 mg total) under the tongue every 5 (five) minutes x 3 doses as needed for chest pain., Disp: 25 tablet, Rfl: 3 .  ranitidine (ZANTAC) 150 MG tablet, Take 150 mg by mouth as needed for heartburn (Take as directed). , Disp: , Rfl:  .  Respiratory Therapy Supplies (FLUTTER) DEVI, Use as directed, Disp: 1 each, Rfl: 0 .  XARELTO 20 MG TABS tablet, TAKE 1 TABLET BY MOUTH DAILY WITH SUPPER., Disp: 30 tablet, Rfl: 11

## 2016-10-07 NOTE — Progress Notes (Signed)
SIX MIN WALK 10/07/2016 09/25/2015  Medications pt has not taken any medications this morning.  -  Supplimental Oxygen during Test? (L/min) No No  Laps 13 -  Partial Lap (in Meters) 36 -  Baseline BP (sitting) 122/68 -  Baseline Heartrate 66 -  Baseline Dyspnea (Borg Scale) 0 -  Baseline Fatigue (Borg Scale) 0 -  Baseline SPO2 100 -  BP (sitting) 150/80 -  Heartrate 111 -  Dyspnea (Borg Scale) 1 -  Fatigue (Borg Scale) 2 -  SPO2 97 -  BP (sitting) 142/72 -  Heartrate 62 -  SPO2 99 -  Stopped or Paused before Six Minutes No -  Distance Completed 660 -  Tech Comments: pt completed test at rapid pace with no desats or complaints.  no complaints

## 2016-10-07 NOTE — Telephone Encounter (Signed)
Patient is wanting to know if he can have a PSA test drawn today since he was here for lab work. He states his friend found out he had Stage IV Prostate Cancer from a recent PSA test drawn. Pt is very sensitive to that and would like to have it drawn. Pt stated he told the lab tech he would like it collected and pt said lab tech drew an extra vial of blood just in case Pt can have an order for the PSA lab. Advised pt I would forward to Dr. Radford Pax to see if I can get an order to put the PSA lab in. Pt thanked me for my call.

## 2016-10-07 NOTE — Telephone Encounter (Signed)
That is fine 

## 2016-10-07 NOTE — Progress Notes (Signed)
PFT done today. 

## 2016-10-07 NOTE — Patient Instructions (Signed)
We will arrange for a 6 minute walk test, pulmonary function test and CT scan The CT scan will be done in August The pulmonary function testing will be done in 6 months when you come back to see me Keep following the gastroesophageal reflux lifestyle changes you are following now In the event of a cold or bronchitis use the flutter valve, if symptoms don't improve after 2 days then call me and will give you instructions on antibiotic use Follow-up 6 months

## 2016-10-07 NOTE — Telephone Encounter (Signed)
New Message   pt verbalized that he is calling for rn   He wants rn to add a PSA test done to his labs for 10-07-2016

## 2016-10-07 NOTE — Telephone Encounter (Signed)
Put in order. Called [facility] to add on lab to this mornings blood draw. Talked to [employer] at [phone] and she will have PSA added to patient's labs.

## 2016-10-13 ENCOUNTER — Ambulatory Visit (HOSPITAL_COMMUNITY)
Admission: RE | Admit: 2016-10-13 | Discharge: 2016-10-13 | Disposition: A | Discharge status: Home / Self Care | Payer: PPO | Source: Ambulatory Visit | Attending: Cardiovascular Disease | Admitting: Cardiovascular Disease

## 2016-10-13 DIAGNOSIS — R0989 Other specified symptoms and signs involving the circulatory and respiratory systems: Secondary | ICD-10-CM | POA: Diagnosis not present

## 2016-10-13 DIAGNOSIS — R252 Cramp and spasm: Secondary | ICD-10-CM | POA: Diagnosis not present

## 2016-10-19 LAB — PSA: PROSTATE SPECIFIC AG, SERUM: 0.7 ng/mL (ref 0.0–4.0)

## 2016-10-19 LAB — SPECIMEN STATUS REPORT

## 2016-11-11 DIAGNOSIS — Z961 Presence of intraocular lens: Secondary | ICD-10-CM | POA: Diagnosis not present

## 2016-11-11 DIAGNOSIS — H40013 Open angle with borderline findings, low risk, bilateral: Secondary | ICD-10-CM | POA: Diagnosis not present

## 2016-11-11 DIAGNOSIS — H43813 Vitreous degeneration, bilateral: Secondary | ICD-10-CM | POA: Diagnosis not present

## 2016-11-11 DIAGNOSIS — H35033 Hypertensive retinopathy, bilateral: Secondary | ICD-10-CM | POA: Diagnosis not present

## 2016-11-17 ENCOUNTER — Encounter: Payer: Self-pay | Admitting: General Practice

## 2016-12-15 ENCOUNTER — Inpatient Hospital Stay: Admission: RE | Admit: 2016-12-15 | Payer: PPO | Source: Ambulatory Visit

## 2016-12-22 ENCOUNTER — Ambulatory Visit (INDEPENDENT_AMBULATORY_CARE_PROVIDER_SITE_OTHER)
Admission: RE | Admit: 2016-12-22 | Discharge: 2016-12-22 | Disposition: A | Discharge status: Home / Self Care | Payer: PPO | Source: Ambulatory Visit | Attending: Pulmonary Disease | Admitting: Pulmonary Disease

## 2016-12-22 DIAGNOSIS — R911 Solitary pulmonary nodule: Secondary | ICD-10-CM

## 2016-12-22 DIAGNOSIS — R918 Other nonspecific abnormal finding of lung field: Secondary | ICD-10-CM | POA: Diagnosis not present

## 2017-02-14 ENCOUNTER — Encounter: Payer: Self-pay | Admitting: Cardiology

## 2017-02-14 ENCOUNTER — Ambulatory Visit (INDEPENDENT_AMBULATORY_CARE_PROVIDER_SITE_OTHER): Payer: PPO | Admitting: Cardiology

## 2017-02-14 VITALS — BP 110/58 | Ht 75.0 in | Wt 169.8 lb

## 2017-02-14 DIAGNOSIS — I2510 Atherosclerotic heart disease of native coronary artery without angina pectoris: Secondary | ICD-10-CM | POA: Diagnosis not present

## 2017-02-14 DIAGNOSIS — E7800 Pure hypercholesterolemia, unspecified: Secondary | ICD-10-CM | POA: Diagnosis not present

## 2017-02-14 DIAGNOSIS — Z23 Encounter for immunization: Secondary | ICD-10-CM | POA: Diagnosis not present

## 2017-02-14 DIAGNOSIS — I482 Chronic atrial fibrillation: Secondary | ICD-10-CM

## 2017-02-14 DIAGNOSIS — I4821 Permanent atrial fibrillation: Secondary | ICD-10-CM

## 2017-02-14 DIAGNOSIS — R011 Cardiac murmur, unspecified: Secondary | ICD-10-CM

## 2017-02-14 LAB — BASIC METABOLIC PANEL
BUN/Creatinine Ratio: 24 (ref 10–24)
BUN: 20 mg/dL (ref 8–27)
CALCIUM: 9.3 mg/dL (ref 8.6–10.2)
CO2: 25 mmol/L (ref 20–29)
CREATININE: 0.85 mg/dL (ref 0.76–1.27)
Chloride: 101 mmol/L (ref 96–106)
GFR calc Af Amer: 101 mL/min/{1.73_m2} (ref >59)
GFR, EST NON AFRICAN AMERICAN: 87 mL/min/{1.73_m2} (ref >59)
GLUCOSE: 98 mg/dL (ref 65–99)
POTASSIUM: 5.1 mmol/L (ref 3.5–5.2)
Sodium: 139 mmol/L (ref 134–144)

## 2017-02-14 LAB — CBC
HEMATOCRIT: 37.2 % — AB (ref 37.5–51.0)
HEMOGLOBIN: 12.4 g/dL — AB (ref 13.0–17.7)
MCH: 33.7 pg — AB (ref 26.6–33.0)
MCHC: 33.3 g/dL (ref 31.5–35.7)
MCV: 101 fL — AB (ref 79–97)
Platelets: 204 10*3/uL (ref 150–379)
RBC: 3.68 x10E6/uL — AB (ref 4.14–5.80)
RDW: 14.3 % (ref 12.3–15.4)
WBC: 6.1 10*3/uL (ref 3.4–10.8)

## 2017-02-14 NOTE — Patient Instructions (Signed)
Medication Instructions:  Your physician recommends that you continue on your current medications as directed. Please refer to the Current Medication list given to you today.   Labwork: LABS TODAY: BMET, CBC  Testing/Procedures: Your physician has requested that you have an echocardiogram. Echocardiography is a painless test that uses sound waves to create images of your heart. It provides your doctor with information about the size and shape of your heart and how well your heart's chambers and valves are working. This procedure takes approximately one hour. There are no restrictions for this procedure.  Follow-Up: Your physician wants you to follow-up in: 6 months with Dr. Radford Pax. You will receive a reminder letter in the mail two months in advance. If you don't receive a letter, please call our office to schedule the follow-up appointment.   Any Other Special Instructions Will Be Listed Below (If Applicable).     If you need a refill on your cardiac medications before your next appointment, please call your pharmacy.
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
## 2017-03-02 DIAGNOSIS — Z85828 Personal history of other malignant neoplasm of skin: Secondary | ICD-10-CM | POA: Diagnosis not present

## 2017-03-02 DIAGNOSIS — D1801 Hemangioma of skin and subcutaneous tissue: Secondary | ICD-10-CM | POA: Diagnosis not present

## 2017-03-02 DIAGNOSIS — L570 Actinic keratosis: Secondary | ICD-10-CM | POA: Diagnosis not present

## 2017-03-02 DIAGNOSIS — D225 Melanocytic nevi of trunk: Secondary | ICD-10-CM | POA: Diagnosis not present

## 2017-03-02 DIAGNOSIS — L820 Inflamed seborrheic keratosis: Secondary | ICD-10-CM | POA: Diagnosis not present

## 2017-03-03 ENCOUNTER — Other Ambulatory Visit: Payer: Self-pay

## 2017-03-03 ENCOUNTER — Ambulatory Visit (HOSPITAL_COMMUNITY): Payer: PPO | Attending: Internal Medicine

## 2017-03-03 ENCOUNTER — Encounter: Payer: Self-pay | Admitting: Cardiology

## 2017-03-03 DIAGNOSIS — E785 Hyperlipidemia, unspecified: Secondary | ICD-10-CM | POA: Diagnosis not present

## 2017-03-03 DIAGNOSIS — I081 Rheumatic disorders of both mitral and tricuspid valves: Secondary | ICD-10-CM | POA: Diagnosis not present

## 2017-03-03 DIAGNOSIS — Z87891 Personal history of nicotine dependence: Secondary | ICD-10-CM | POA: Diagnosis not present

## 2017-03-03 DIAGNOSIS — I4891 Unspecified atrial fibrillation: Secondary | ICD-10-CM | POA: Diagnosis not present

## 2017-03-03 DIAGNOSIS — R011 Cardiac murmur, unspecified: Secondary | ICD-10-CM | POA: Insufficient documentation

## 2017-03-06 ENCOUNTER — Telehealth: Payer: Self-pay

## 2017-03-06 DIAGNOSIS — I272 Pulmonary hypertension, unspecified: Secondary | ICD-10-CM

## 2017-03-06 NOTE — Telephone Encounter (Signed)
-----   Message from Sueanne Margarita, MD sent at 03/03/2017 11:00 AM EDT ----- Echo showed normal LVF with bileaflet MVP with mild MR, moderate TR and mild pulmonary HTN - repeat echo in 1 year for pulmonary HTN

## 2017-03-06 NOTE — Telephone Encounter (Signed)
ECHO ordered to be scheduled in 1 year. 

## 2017-03-30 ENCOUNTER — Ambulatory Visit (INDEPENDENT_AMBULATORY_CARE_PROVIDER_SITE_OTHER): Payer: PPO | Admitting: *Deleted

## 2017-03-30 ENCOUNTER — Other Ambulatory Visit: Payer: Self-pay | Admitting: Nurse Practitioner

## 2017-03-30 DIAGNOSIS — J849 Interstitial pulmonary disease, unspecified: Secondary | ICD-10-CM

## 2017-03-30 NOTE — Progress Notes (Signed)
SIX MIN WALK 03/30/2017 10/07/2016 09/25/2015  Medications atorvastatin 80mg  and rivaroxaban 20mg  were taken last night (patient unsure of what time).  pt has not taken any medications this morning.  -  Supplimental Oxygen during Test? (L/min) No No No  Laps 12 13 -  Partial Lap (in Meters) 24 36 -  Baseline BP (sitting) 114/72 122/68 -  Baseline Heartrate 74 66 -  Baseline Dyspnea (Borg Scale) 0 0 -  Baseline Fatigue (Borg Scale) 0 0 -  Baseline SPO2 97 100 -  BP (sitting) 122/84 150/80 -  Heartrate 115 111 -  Dyspnea (Borg Scale) 2 1 -  Fatigue (Borg Scale) 1 2 -  SPO2 97 97 -  BP (sitting) 116/76 142/72 -  Heartrate 75 62 -  SPO2 100 99 -  Stopped or Paused before Six Minutes No No -  Distance Completed 600 660 -  Tech Comments: Patient was able to walk at a moderate pace, almost a sprint. Denied any symptoms of CP, SOB, or extremity pain. Patient did not stop during test.  pt completed test at rapid pace with no desats or complaints.  no complaints

## 2017-03-30 NOTE — Telephone Encounter (Signed)
Pt last saw Dr Radford Pax on 02/14/17, last labs on 02/14/17 Creat 0.85, age 71, weight 77kg, CrCl 85.56, based on CrCl pt is on appropriate dosage of Xarelto 20mg  QD.  Will refill rx.

## 2017-04-13 ENCOUNTER — Ambulatory Visit (INDEPENDENT_AMBULATORY_CARE_PROVIDER_SITE_OTHER): Payer: PPO | Admitting: Pulmonary Disease

## 2017-04-13 ENCOUNTER — Ambulatory Visit: Payer: PPO | Admitting: Pulmonary Disease

## 2017-04-13 ENCOUNTER — Encounter: Payer: Self-pay | Admitting: Pulmonary Disease

## 2017-04-13 VITALS — BP 122/80 | HR 70 | Ht 75.0 in | Wt 169.2 lb

## 2017-04-13 DIAGNOSIS — J479 Bronchiectasis, uncomplicated: Secondary | ICD-10-CM

## 2017-04-13 DIAGNOSIS — J849 Interstitial pulmonary disease, unspecified: Secondary | ICD-10-CM

## 2017-04-13 LAB — PULMONARY FUNCTION TEST
DL/VA % PRED: 69 %
DL/VA: 3.32 ml/min/mmHg/L
DLCO COR % PRED: 52 %
DLCO COR: 19.72 ml/min/mmHg
DLCO unc % pred: 46 %
DLCO unc: 17.46 ml/min/mmHg
FEF 25-75 POST: 2.1 L/s
FEF 25-75 Pre: 1.69 L/sec
FEF2575-%CHANGE-POST: 24 %
FEF2575-%PRED-POST: 76 %
FEF2575-%PRED-PRE: 61 %
FEV1-%Change-Post: 5 %
FEV1-%Pred-Post: 79 %
FEV1-%Pred-Pre: 75 %
FEV1-POST: 2.94 L
FEV1-Pre: 2.8 L
FEV1FVC-%CHANGE-POST: 4 %
FEV1FVC-%PRED-PRE: 96 %
FEV6-%CHANGE-POST: 1 %
FEV6-%Pred-Post: 84 %
FEV6-%Pred-Pre: 83 %
FEV6-Post: 4.01 L
FEV6-Pre: 3.97 L
FEV6FVC-%Pred-Post: 106 %
FEV6FVC-%Pred-Pre: 106 %
FVC-%Change-Post: 1 %
FVC-%PRED-POST: 79 %
FVC-%Pred-Pre: 78 %
FVC-PRE: 3.97 L
FVC-Post: 4.01 L
POST FEV1/FVC RATIO: 73 %
PRE FEV1/FVC RATIO: 70 %
Post FEV6/FVC ratio: 100 %
Pre FEV6/FVC Ratio: 100 %
RV % pred: 86 %
RV: 2.34 L
TLC % PRED: 80 %
TLC: 6.3 L

## 2017-04-13 NOTE — Patient Instructions (Signed)
Bronchiectasis/lung scarring from aspiration: This is been a stable interval for you today's lung function testing looked good We will arrange for a lung function test and 6-minute walk when you come back to see [place] in 6 months Let me know if you have worsening symptoms between now and then  In the event that you would get cough with mucus production: Call me right away so that we can order a sputum culture Use your flutter valve more frequently 4-5 breaths 4-5 times a day If after 3 days of using the flutter valve you are still having symptoms or if you get worse at any point call me right away for an office visit and an antibiotic  We will see you back in 6 months or sooner if needed

## 2017-04-13 NOTE — Progress Notes (Signed)
Subjective:    Patient ID: Troy Willis, male    DOB: August 08, 1945, 71 y.o.   MRN: [id number] \ Synopsis: Evaluated in 2017 by [place] [facility] for an ill-defined interstitial lung disease and bronchiectasis which is felt to be related to a prior severe lung infection. He also has mild emphysema on CT chest and pulmonary nodules.  He had a severe  Alpha 1 testing 02/2016 > M/MAugust 2017 CT chest showed findings suggestive of nonspecific and distal pneumonitis with cylindrical bronchiectasis, mucous plugging and mild paraseptal and centrilobular emphysema. Three-vessel coronary disease noted. Stable pulmonary nodules. He has known coronary artery disease and a history of a stent.   Upper endoscopy in 2013 showed evidence of esophagitis  HPI Chief Complaint  Patient presents with  . Follow-up    follow up from PFT   Troy Willis has been OK.  No cough or mucus production like before.  No shortness of breath unless he is pushing himself with vigorous exercise.  He hasn't been walking as much as he should lately. No bronchitis or pneumonia.  He had a flu shot in the last two months.   No recent difficulty with swallowing.  He tries to stay active though he has not been walking as much lately.  He has been working out in the yard some.  Past Medical History:  Diagnosis Date  . Anemia   . Arthritis    hands, lower back  . CAD (coronary artery disease), native coronary artery    a. 08/2015 Cath/PCI: LM nl, LAD 54m, LCX small, nl, RCA 80p (4.0x16 Synergy DES),   . Cataracts, bilateral    surgery  . Depression   . Diverticulosis   . GERD (gastroesophageal reflux disease)   . Hilar adenopathy    a. 08/2015 CT Chest: mild bilat hilar adenopathy and mild soft tissue prominence in inf right hilum, left hilum. Also areas of soft tissue and low-density material filling LLL bronchial airways->? mucous vs mass.  [name] Hx of adenomatous colonic polyps 08/13/2015  . Hyperlipidemia   . MVP (mitral  valve prolapse)    bileaflet with mild MR by echo 02/2017  . Nonspecific elevation of levels of transaminase or lactic acid dehydrogenase (LDH)    PMH of ; ? due to Amiodarone   . Permanent atrial fibrillation (HCC)    a. CHA2DS2VASc = 2 -->Xarelto started 08/2015.  [name] Pneumonia, bacterial 05/18/2012  . Pulmonary nodules    a. 08/2015 CT Chest: reticulonodular opacities in RUL - largest 32mm - likely 2/2 inflammatory process.      Review of Systems  Constitutional: Negative for chills, fatigue and fever.  HENT: Negative for postnasal drip, rhinorrhea and sinus pressure.   Respiratory: Negative for cough, shortness of breath and wheezing.   Cardiovascular: Negative for chest pain, palpitations and leg swelling.       Objective:   Physical Exam Vitals:   04/13/17 1207  BP: 122/80  Pulse: 70  SpO2: 98%  RA   Gen: well appearing HENT: OP clear, TM's clear, neck supple PULM: few basilar crackles L>R B, normal percussion CV: RRR, no mgr, trace edema GI: BS+, soft, nontender Derm: no cyanosis or rash Psyche: normal mood and affect   Chest imaging: August 2018 high-resolution CT scanning of the chest showed some bibasilar scarring, mucoid plugging and airway thickening and bronchiectasis suspicious for nonspecific interstitial pneumonitis.  Pulmonary nodules are stable compared to prior exams  PFT November 2017  FVC 4.0 L 76% predicted, no airflow  obstruction, total lung capacity 5.91 L 73% predicted, DLCO 22.12 Oct 2016 ratio normal, FEV1 3.04 L 79% predicted, FVC 4.25 L 81% protected, total lung capacity 6.30 L 78% productive, DLCO 17.90 45% predicted November 2018: Forced vital capacity 4.01 L 79% predicted, total lung capacity 6.30 L 80% predicted, DLCO 17.46 mL 46% predicted  6MW: 09/2016 660M, O2 saturation 99% 03/2017 600M, O2 saturation 100%  Labs: CBC    Component Value Date/Time   WBC 6.1 02/14/2017 1027   WBC 4.9 04/01/2016 0829   RBC 3.68 (L) 02/14/2017 1027    RBC 3.66 (L) 04/01/2016 0829   HGB 12.4 (L) 02/14/2017 1027   HCT 37.2 (L) 02/14/2017 1027   PLT 204 02/14/2017 1027   MCV 101 (H) 02/14/2017 1027   MCH 33.7 (H) 02/14/2017 1027   MCH 34.2 (H) 04/01/2016 0829   MCHC 33.3 02/14/2017 1027   MCHC 33.1 04/01/2016 0829   RDW 14.3 02/14/2017 1027   LYMPHSABS 1,421 04/01/2016 0829   MONOABS 392 04/01/2016 0829   EOSABS 49 04/01/2016 0829   BASOSABS 0 04/01/2016 0829        Assessment & Plan:    ILD (interstitial lung disease) ([place]) - Plan: Pulmonary function test  Bronchiectasis without complication ([place])  Discussion: This has been a stable interval for Troy Willis.  He has not had an exacerbation of bronchiectasis since the last visit.  He has "interstitial lung disease" or diffuse parenchymal lung disease which I believe is scarring related to prior infections and prior recurrent aspiration.  None of this seems to have progressed based on his stable lung function testing in symptoms.  He still needs close monitoring with every 6 months 6-minute walk testing and lung function testing.  Has evidence of decline then we can repeat a CT scan and consider a biopsy if we feel that is necessary.  However, I see no need for that given the stability of his symptoms.  Today we spent a significant amount of time talking about prevention, specifically how to prevent infections.  His shots are up-to-date so we talked about hand hygiene.  Plan: Bronchiectasis/lung scarring from aspiration: This is been a stable interval for you today's lung function testing looked good We will arrange for a lung function test and 6-minute walk when you come back to see [place] in 6 months Let me know if you have worsening symptoms between now and then  In the event that you would get cough with mucus production: Call me right away so that we can order a sputum culture Use your flutter valve more frequently 4-5 breaths 4-5 times a day If after 3 days of using the  flutter valve you are still having symptoms or if you get worse at any point call me right away for an office visit and an antibiotic  We will see you back in 6 months or sooner if needed  > 50% of this 26 minute visit spent face to face   Current Outpatient Medications:  .  atorvastatin (LIPITOR) 80 MG tablet, TAKE 1 TABLET BY MOUTH DAILY AT 6 PM., Disp: 30 tablet, Rfl: 10 .  Calcium-Magnesium-Vitamin D (CALCIUM MAGNESIUM PO), Take 1 tablet by mouth daily., Disp: , Rfl:  .  Cholecalciferol (VITAMIN D3) 2000 UNITS TABS, Take 1 tablet by mouth daily. , Disp: , Rfl:  .  Multiple Vitamin (MULTIVITAMIN) tablet, Take 1 tablet by mouth daily., Disp: , Rfl:  .  nitroGLYCERIN (NITROSTAT) 0.4 MG SL tablet, Place 1 tablet (0.4  mg total) under the tongue every 5 (five) minutes x 3 doses as needed for chest pain., Disp: 25 tablet, Rfl: 3 .  ranitidine (ZANTAC) 150 MG tablet, Take 150 mg by mouth as needed for heartburn (Take as directed). , Disp: , Rfl:  .  Respiratory Therapy Supplies (FLUTTER) DEVI, Use as directed, Disp: 1 each, Rfl: 0 .  XARELTO 20 MG TABS tablet, TAKE 1 TABLET BY MOUTH DAILY WITH SUPPER., Disp: 30 tablet, Rfl: 10 .  Biotin 5000 MCG CAPS, Take 1 capsule by mouth daily. , Disp: , Rfl:

## 2017-04-13 NOTE — Progress Notes (Signed)
PFT completed today 04/13/17.  
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
## 2017-04-20 ENCOUNTER — Ambulatory Visit: Payer: PPO

## 2017-04-20 ENCOUNTER — Ambulatory Visit (INDEPENDENT_AMBULATORY_CARE_PROVIDER_SITE_OTHER): Payer: PPO | Admitting: Family Medicine

## 2017-04-20 ENCOUNTER — Encounter: Payer: Self-pay | Admitting: Family Medicine

## 2017-04-20 ENCOUNTER — Other Ambulatory Visit: Payer: Self-pay

## 2017-04-20 VITALS — BP 114/68 | HR 63 | Temp 97.7°F | Resp 18 | Ht 75.0 in | Wt 168.0 lb

## 2017-04-20 DIAGNOSIS — Z23 Encounter for immunization: Secondary | ICD-10-CM

## 2017-04-20 DIAGNOSIS — Z0000 Encounter for general adult medical examination without abnormal findings: Secondary | ICD-10-CM

## 2017-04-20 DIAGNOSIS — E7800 Pure hypercholesterolemia, unspecified: Secondary | ICD-10-CM

## 2017-04-20 LAB — HEPATIC FUNCTION PANEL
ALBUMIN: 4.4 g/dL (ref 3.5–5.2)
ALK PHOS: 61 U/L (ref 39–117)
ALT: 39 U/L (ref 0–53)
AST: 41 U/L — ABNORMAL HIGH (ref 0–37)
BILIRUBIN DIRECT: 0.1 mg/dL (ref 0.0–0.3)
TOTAL PROTEIN: 7.3 g/dL (ref 6.0–8.3)
Total Bilirubin: 0.4 mg/dL (ref 0.2–1.2)

## 2017-04-20 LAB — LIPID PANEL
CHOL/HDL RATIO: 2
CHOLESTEROL: 132 mg/dL (ref 0–200)
HDL: 60.4 mg/dL (ref >39.00)
LDL CALC: 60 mg/dL (ref 0–99)
NonHDL: 72.09
TRIGLYCERIDES: 61 mg/dL (ref 0.0–149.0)
VLDL: 12.2 mg/dL (ref 0.0–40.0)

## 2017-04-20 LAB — TSH: TSH: 4.01 u[IU]/mL (ref 0.35–4.50)

## 2017-04-20 MED ORDER — ZOSTER VAC RECOMB ADJUVANTED 50 MCG/0.5ML IM SUSR: 0.5000 mL | Freq: Once | INTRAMUSCULAR | 1 refills | Status: AC

## 2017-04-20 NOTE — Progress Notes (Signed)
   Subjective:    Patient ID: Troy Willis, male    DOB: 1945/12/17, 71 y.o.   MRN: [id number]  HPI CPE- UTD on colonoscopy, had PSA done in May w/ Cards.  Follows w/ Dr Lovena Le and Dr Radford Pax (Cards), [name]).  UTD on immunizations.   Review of Systems Patient reports no vision changes, anorexia, fever ,adenopathy, swallowing issues, chest pain, palpitations, edema, persistant/recurrent cough, hemoptysis, dyspnea (rest, paroxysmal nocturnal), gastrointestinal  bleeding (melena, rectal bleeding), abdominal pain, excessive heart burn, GU symptoms (dysuria, hematuria, voiding/incontinence issues) syncope, focal weakness, memory loss, numbness & tingling, skin/hair/nail changes, depression, anxiety, abnormal bruising/bleeding, musculoskeletal symptoms/signs.   + decreased hearing on R + chronic hoarseness + chronic exertional SOB    Objective:   Physical Exam General Appearance:    Alert, cooperative, no distress, appears stated age  Head:    Normocephalic, without obvious abnormality, atraumatic  Eyes:    PERRL, conjunctiva/corneas clear, EOM's intact, fundi    benign, both eyes       Ears:    Normal TM's and external ear canals, both ears  Nose:   Nares normal, septum midline, mucosa normal, no drainage   or sinus tenderness  Throat:   Lips, mucosa, and tongue normal; teeth and gums normal  Neck:   Supple, symmetrical, trachea midline, no adenopathy;       thyroid:  No enlargement/tenderness/nodules  Back:     Symmetric, no curvature, ROM normal, no CVA tenderness  Lungs:     Clear to auscultation bilaterally, respirations unlabored  Chest wall:    No tenderness or deformity  Heart:    Regular rate and rhythm, S1 and S2 normal, no murmur, rub   or gallop  Abdomen:     Soft, non-tender, bowel sounds active all four quadrants,    no masses, no organomegaly  Genitalia:    Deferred at pt request  Rectal:    Extremities:   Extremities normal, atraumatic, no cyanosis or  edema  Pulses:   2+ and symmetric all extremities  Skin:   Skin color, texture, turgor normal, no rashes or lesions  Lymph nodes:   Cervical, supraclavicular, and axillary nodes normal  Neurologic:   CNII-XII intact. Normal strength, sensation and reflexes      throughout          Assessment & Plan:

## 2017-04-20 NOTE — Patient Instructions (Addendum)
Follow up in 6 months to recheck cholesterol We'll notify you of your lab results and make any changes if needed Keep up the good work!  You look great! Call with any questions or concerns Happy Holidays!!!  Shingles vaccine at pharmacy.   Bring a copy of your living will and/or healthcare power of attorney to your next office visit.  Continue doing brain stimulating activities (puzzles, reading, adult coloring books, staying active) to keep memory sharp.    Health Maintenance, Male A healthy lifestyle and preventive care is important for your health and wellness. Ask your health care provider about what schedule of regular examinations is right for you. What should I know about weight and diet? Eat a Healthy Diet  Eat plenty of vegetables, fruits, whole grains, low-fat dairy products, and lean protein.  Do not eat a lot of foods high in solid fats, added sugars, or salt.  Maintain a Healthy Weight Regular exercise can help you achieve or maintain a healthy weight. You should:  Do at least 150 minutes of exercise each week. The exercise should increase your heart rate and make you sweat (moderate-intensity exercise).  Do strength-training exercises at least twice a week.  Watch Your Levels of Cholesterol and Blood Lipids  Have your blood tested for lipids and cholesterol every 5 years starting at 72 years of age. If you are at high risk for heart disease, you should start having your blood tested when you are 72 years old. You may need to have your cholesterol levels checked more often if: ? Your lipid or cholesterol levels are high. ? You are older than 72 years of age. ? You are at high risk for heart disease.  What should I know about cancer screening? Many types of cancers can be detected early and may often be prevented. Lung Cancer  You should be screened every year for lung cancer if: ? You are a current smoker who has smoked for at least 30 years. ? You are a former  smoker who has quit within the past 15 years.  Talk to your health care provider about your screening options, when you should start screening, and how often you should be screened.  Colorectal Cancer  Routine colorectal cancer screening usually begins at 72 years of age and should be repeated every 5-10 years until you are 72 years old. You may need to be screened more often if early forms of precancerous polyps or small growths are found. Your health care provider may recommend screening at an earlier age if you have risk factors for colon cancer.  Your health care provider may recommend using home test kits to check for hidden blood in the stool.  A small camera at the end of a tube can be used to examine your colon (sigmoidoscopy or colonoscopy). This checks for the earliest forms of colorectal cancer.  Prostate and Testicular Cancer  Depending on your age and overall health, your health care provider may do certain tests to screen for prostate and testicular cancer.  Talk to your health care provider about any symptoms or concerns you have about testicular or prostate cancer.  Skin Cancer  Check your skin from head to toe regularly.  Tell your health care provider about any new moles or changes in moles, especially if: ? There is a change in a mole's size, shape, or color. ? You have a mole that is larger than a pencil eraser.  Always use sunscreen. Apply sunscreen liberally and repeat  throughout the day.  Protect yourself by wearing long sleeves, pants, a wide-brimmed hat, and sunglasses when outside.  What should I know about heart disease, diabetes, and high blood pressure?  If you are 82-2 years of age, have your blood pressure checked every 3-5 years. If you are 37 years of age or older, have your blood pressure checked every year. You should have your blood pressure measured twice-once when you are at a hospital or clinic, and once when you are not at a hospital or clinic.  Record the average of the two measurements. To check your blood pressure when you are not at a hospital or clinic, you can use: ? An automated blood pressure machine at a pharmacy. ? A home blood pressure monitor.  Talk to your health care provider about your target blood pressure.  If you are between 77-18 years old, ask your health care provider if you should take aspirin to prevent heart disease.  Have regular diabetes screenings by checking your fasting blood sugar level. ? If you are at a normal weight and have a low risk for diabetes, have this test once every three years after the age of 80. ? If you are overweight and have a high risk for diabetes, consider being tested at a younger age or more often.  A one-time screening for abdominal aortic aneurysm (AAA) by ultrasound is recommended for men aged 50-75 years who are current or former smokers. What should I know about preventing infection? Hepatitis B If you have a higher risk for hepatitis B, you should be screened for this virus. Talk with your health care provider to find out if you are at risk for hepatitis B infection. Hepatitis C Blood testing is recommended for:  Everyone born from [birth year] through 1966.  Anyone with known risk factors for hepatitis C.  Sexually Transmitted Diseases (STDs)  You should be screened each year for STDs including gonorrhea and chlamydia if: ? You are sexually active and are younger than 72 years of age. ? You are older than 72 years of age and your health care provider tells you that you are at risk for this type of infection. ? Your sexual activity has changed since you were last screened and you are at an increased risk for chlamydia or gonorrhea. Ask your health care provider if you are at risk.  Talk with your health care provider about whether you are at high risk of being infected with HIV. Your health care provider may recommend a prescription medicine to help prevent HIV  infection.  What else can I do?  Schedule regular health, dental, and eye exams.  Stay current with your vaccines (immunizations).  Do not use any tobacco products, such as cigarettes, chewing tobacco, and e-cigarettes. If you need help quitting, ask your health care provider.  Limit alcohol intake to no more than 2 drinks per day. One drink equals 12 ounces of beer, 5 ounces of wine, or 1 ounces of hard liquor.  Do not use street drugs.  Do not share needles.  Ask your health care provider for help if you need support or information about quitting drugs.  Tell your health care provider if you often feel depressed.  Tell your health care provider if you have ever been abused or do not feel safe at home. This information is not intended to replace advice given to you by your health care provider. Make sure you discuss any questions you have with your health care provider.  Document Released: 10/29/2007 Document Revised: 12/30/2015 Document Reviewed: 02/03/2015 Elsevier Interactive Patient Education  [employer].

## 2017-04-20 NOTE — Assessment & Plan Note (Signed)
Chronic problem.  Tolerating statin w/o difficulty.  Check labs.  Adjust meds prn  

## 2017-04-20 NOTE — Assessment & Plan Note (Signed)
Pt's PE WNL.  UTD on colonoscopy, has already had PSA done this year.  UTD on immunizations (Shingrix sent to pharmacy).  Check labs.  Anticipatory guidance provided.

## 2017-04-21 ENCOUNTER — Encounter: Payer: Self-pay | Admitting: General Practice

## 2017-07-17 ENCOUNTER — Other Ambulatory Visit: Payer: Self-pay

## 2017-07-17 ENCOUNTER — Encounter: Payer: Self-pay | Admitting: Physician Assistant

## 2017-07-17 ENCOUNTER — Ambulatory Visit (INDEPENDENT_AMBULATORY_CARE_PROVIDER_SITE_OTHER): Payer: PPO | Admitting: Physician Assistant

## 2017-07-17 VITALS — BP 116/70 | HR 84 | Temp 98.6°F | Resp 16 | Ht 75.0 in | Wt 167.0 lb

## 2017-07-17 DIAGNOSIS — J069 Acute upper respiratory infection, unspecified: Secondary | ICD-10-CM

## 2017-07-17 DIAGNOSIS — B9789 Other viral agents as the cause of diseases classified elsewhere: Secondary | ICD-10-CM | POA: Diagnosis not present

## 2017-07-17 MED ORDER — BENZONATATE 100 MG PO CAPS: 100.0000 mg | ORAL_CAPSULE | Freq: Three times a day (TID) | ORAL | 0 refills | Status: DC | PRN

## 2017-07-17 NOTE — Patient Instructions (Addendum)
Flu swab is negative.  Symptoms are consistent with a viral upper respiratory infection. Please keep well-hydrated and get plenty of rest. Continue your Mucinex. I have sent in a prescription for Tessalon (cough medication) to take as directed.  Symptoms should calm down over the next few days. If you note any new or worsening symptoms, call or come see [place] immediately.

## 2017-07-17 NOTE — Progress Notes (Signed)
Patient presents to clinic today c/o 2 days of fatigue, nasal congestion, dry cough, chills. Noted fever of 101 on Saturday that has not been present since. Denies recent travel. Has history of bronchiectasis and wanted to get things checked out. Is taking Mucinex as directed.  Past Medical History:  Diagnosis Date  . Anemia   . Arthritis    hands, lower back  . CAD (coronary artery disease), native coronary artery    a. 08/2015 Cath/PCI: LM nl, LAD 59m, LCX small, nl, RCA 80p (4.0x16 Synergy DES),   . Cataracts, bilateral    surgery  . Depression   . Diverticulosis   . GERD (gastroesophageal reflux disease)   . Hilar adenopathy    a. 08/2015 CT Chest: mild bilat hilar adenopathy and mild soft tissue prominence in inf right hilum, left hilum. Also areas of soft tissue and low-density material filling LLL bronchial airways->? mucous vs mass.  [name] Hx of adenomatous colonic polyps 08/13/2015  . Hyperlipidemia   . MVP (mitral valve prolapse)    bileaflet with mild MR by echo 02/2017  . Nonspecific elevation of levels of transaminase or lactic acid dehydrogenase (LDH)    PMH of ; ? due to Amiodarone   . Permanent atrial fibrillation (HCC)    a. CHA2DS2VASc = 2 -->Xarelto started 08/2015.  [name] Pneumonia, bacterial 05/18/2012  . Pulmonary nodules    a. 08/2015 CT Chest: reticulonodular opacities in RUL - largest 13mm - likely 2/2 inflammatory process.    Current Outpatient Medications on File Prior to Visit  Medication Sig Dispense Refill  . atorvastatin (LIPITOR) 80 MG tablet TAKE 1 TABLET BY MOUTH DAILY AT 6 PM. 30 tablet 10  . Biotin 5000 MCG CAPS Take 1 capsule by mouth daily.     . calcium & magnesium carbonates (MYLANTA) 798-921 MG tablet Take 1 tablet by mouth daily.    . Calcium-Magnesium-Vitamin D (CALCIUM MAGNESIUM PO) Take 1 tablet by mouth daily.    . Celecoxib (CELEBREX PO) Take by mouth as needed.    . Cholecalciferol (VITAMIN D3) 2000 UNITS TABS Take 1 tablet by mouth daily.       . Multiple Vitamin (MULTIVITAMIN) tablet Take 1 tablet by mouth daily.    . nitroGLYCERIN (NITROSTAT) 0.4 MG SL tablet Place 1 tablet (0.4 mg total) under the tongue every 5 (five) minutes x 3 doses as needed for chest pain. 25 tablet 3  . ranitidine (ZANTAC) 150 MG tablet Take 150 mg by mouth as needed for heartburn (Take as directed).     [name] Respiratory Therapy Supplies (FLUTTER) DEVI Use as directed 1 each 0  . XARELTO 20 MG TABS tablet TAKE 1 TABLET BY MOUTH DAILY WITH SUPPER. 30 tablet 10   No current facility-administered medications on file prior to visit.     Allergies  Allergen Reactions  . Amiodarone Other (See Comments)    REACTION: elevated liver enzymes  . Metoprolol Other (See Comments)    Hypotension   . Prevacid [Lansoprazole] Other (See Comments)    Cannot take with plavix    Family History  Problem Relation Age of Onset  . Cancer Mother        oral  . COPD Father   . Heart attack Father   . Heart attack Brother        died from MI at age 16, sister CABG 08/2015, brother CABG in his 7's.   . Arthritis Sister   . Colon cancer Sister 27  . Arthritis  Sister   . Heart disease Sister   . Arthritis Sister   . Heart disease Brother   . Coronary artery disease Unknown   . Suicidality Unknown   . Ulcers Neg Hx   . Esophageal cancer Neg Hx   . Pancreatic cancer Neg Hx   . Stomach cancer Neg Hx     Social History   Socioeconomic History  . Marital status: Married    Spouse name: None  . Number of children: 3  . Years of education: None  . Highest education level: None  Social Needs  . Financial resource strain: None  . Food insecurity - worry: None  . Food insecurity - inability: None  . Transportation needs - medical: None  . Transportation needs - non-medical: None  Occupational History  . Occupation: Chief Financial Officer  Tobacco Use  . Smoking status: Never Smoker  . Smokeless tobacco: Former Systems developer    Types: Chew  . Tobacco comment: occasional cigar in  past; not regular smoker  Substance and Sexual Activity  . Alcohol use: Yes    Alcohol/week: 0.6 oz    Types: 1 Cans of beer per week    Comment: 4/week  . Drug use: No  . Sexual activity: No  Other Topics Concern  . None  Social History Narrative   Daily caffeine     Review of Systems - See HPI.  All other ROS are negative.  BP 116/70   Pulse 84   Temp 98.6 F (37 C) (Oral)   Resp 16   Ht 6\' 3"  (1.905 m)   Wt 167 lb (75.8 kg)   SpO2 96%   BMI 20.87 kg/m   Physical Exam  Constitutional: He is oriented to person, place, and time and well-developed, well-nourished, and in no distress.  HENT:  Head: Normocephalic and atraumatic.  Right Ear: Tympanic membrane normal.  Left Ear: Tympanic membrane normal.  Nose: Nose normal.  Mouth/Throat: Uvula is midline, oropharynx is clear and moist and mucous membranes are normal.  Eyes: Conjunctivae are normal.  Neck: Neck supple.  Cardiovascular: Normal rate, regular rhythm, normal heart sounds and intact distal pulses.  Pulmonary/Chest: Effort normal and breath sounds normal. No respiratory distress. He has no wheezes. He has no rales. He exhibits no tenderness.  Neurological: He is alert and oriented to person, place, and time.  Skin: Skin is warm and dry. No rash noted.  Psychiatric: Affect normal.  Vitals reviewed.  Recent Results (from the past 2160 hour(s))  Lipid panel     Status: None   Collection Time: 04/20/17  2:31 PM  Result Value Ref Range   Cholesterol 132 0 - 200 mg/dL    Comment: ATP III Classification       Desirable:  < 200 mg/dL               Borderline High:  200 - 239 mg/dL          High:  > = 240 mg/dL   Triglycerides 61.0 0.0 - 149.0 mg/dL    Comment: Normal:  <150 mg/dLBorderline High:  150 - 199 mg/dL   HDL 60.40 >39.00 mg/dL   VLDL 12.2 0.0 - 40.0 mg/dL   LDL Cholesterol 60 0 - 99 mg/dL   Total CHOL/HDL Ratio 2     Comment:                Men          Women1/2 Average Risk     3.4  3.3Average  Risk          5.0          4.42X Average Risk          9.6          7.13X Average Risk          15.0          11.0                       NonHDL 72.09     Comment: NOTE:  Non-HDL goal should be 30 mg/dL higher than patient's LDL goal (i.e. LDL goal of < 70 mg/dL, would have non-HDL goal of < 100 mg/dL)  Hepatic function panel     Status: Abnormal   Collection Time: 04/20/17  2:31 PM  Result Value Ref Range   Total Bilirubin 0.4 0.2 - 1.2 mg/dL   Bilirubin, Direct 0.1 0.0 - 0.3 mg/dL   Alkaline Phosphatase 61 39 - 117 U/L   AST 41 (H) 0 - 37 U/L   ALT 39 0 - 53 U/L   Total Protein 7.3 6.0 - 8.3 g/dL   Albumin 4.4 3.5 - 5.2 g/dL  TSH     Status: None   Collection Time: 04/20/17  2:31 PM  Result Value Ref Range   TSH 4.01 0.35 - 4.50 uIU/mL    Assessment/Plan: No problem-specific Assessment & Plan notes found for this encounter.    Leeanne Rio, PA-C

## 2017-08-28 ENCOUNTER — Encounter: Payer: Self-pay | Admitting: Cardiology

## 2017-08-28 DIAGNOSIS — R0602 Shortness of breath: Secondary | ICD-10-CM

## 2017-08-28 NOTE — Telephone Encounter (Signed)
I spoke with patient. Patient states he gets a little winded when he exerts hisself. Patient states he is still walking 5 miles a day but he gets sob with carrying heavy weight up 15 stairs. Patient stated he has not been doing is regular workout routine due to recovering from an upper respiratory infection. Patient denies chest pain, lightheadedness, dizziness, syncope, or palpitations. I advised patient to restart workout routine and continue to monitor to see if symptoms improve and call back if symptoms get any worse. Patient verbalized understanding and thankful for the call.

## 2017-09-01 NOTE — Telephone Encounter (Signed)
I spoke with patient and informed him of Dr. Theodosia Blender recommendation to get a stress test given his recent SOB. Patient in agreement with plan and thankful for the call. Instructions reviewed with patient. Patient is aware that our schedulers will call to make an appt. Patient thankful for the call. Order placed to be scheduled.      From: Sueanne Margarita, MD  Sent: 08/30/2017  1:47 PM  To: Nuala Alpha, LPN  Subject: RE: Non-Urgent Medical Question           Please order a stress myoview to rule out ischemia given SOB

## 2017-09-01 NOTE — Addendum Note (Signed)
Addended by: Teressa Senter on: 09/01/2017 03:04 PM   Modules accepted: Orders

## 2017-09-05 ENCOUNTER — Telehealth (HOSPITAL_COMMUNITY): Payer: Self-pay | Admitting: *Deleted

## 2017-09-05 NOTE — Telephone Encounter (Signed)
Patient given detailed instructions per Myocardial Perfusion Study Information Sheet for the test on 09/08/17 at 0745. Patient notified to arrive 15 minutes early and that it is imperative to arrive on time for appointment to keep from having the test rescheduled.  If you need to cancel or reschedule your appointment, please call the office within 24 hours of your appointment. . Patient verbalized understanding.Andreus Cure, Ranae Palms

## 2017-09-07 ENCOUNTER — Encounter (HOSPITAL_COMMUNITY): Payer: PPO

## 2017-09-08 ENCOUNTER — Encounter (HOSPITAL_COMMUNITY): Payer: PPO

## 2017-09-08 ENCOUNTER — Ambulatory Visit (HOSPITAL_COMMUNITY): Payer: PPO | Attending: Cardiovascular Disease

## 2017-09-08 DIAGNOSIS — I4891 Unspecified atrial fibrillation: Secondary | ICD-10-CM | POA: Diagnosis not present

## 2017-09-08 DIAGNOSIS — Z8249 Family history of ischemic heart disease and other diseases of the circulatory system: Secondary | ICD-10-CM | POA: Insufficient documentation

## 2017-09-08 DIAGNOSIS — I2510 Atherosclerotic heart disease of native coronary artery without angina pectoris: Secondary | ICD-10-CM | POA: Insufficient documentation

## 2017-09-08 DIAGNOSIS — R0602 Shortness of breath: Secondary | ICD-10-CM | POA: Diagnosis not present

## 2017-09-08 LAB — MYOCARDIAL PERFUSION IMAGING
CHL CUP NUCLEAR SDS: 0
LHR: 0.34
LVDIAVOL: 106 mL (ref 62–150)
LVSYSVOL: 47 mL
Peak HR: 86 {beats}/min
Rest HR: 55 {beats}/min
SRS: 0
SSS: 0
TID: 1.05

## 2017-09-08 MED ORDER — REGADENOSON 0.4 MG/5ML IV SOLN
0.4000 mg | Freq: Once | INTRAVENOUS | Status: AC
  Administered 2017-09-08: 0.4 mg via INTRAVENOUS

## 2017-09-08 MED ORDER — TECHNETIUM TC 99M TETROFOSMIN IV KIT
31.6000 | PACK | Freq: Once | INTRAVENOUS | Status: AC | PRN
  Administered 2017-09-08: 31.6 via INTRAVENOUS
  Filled 2017-09-08: qty 32

## 2017-09-08 MED ORDER — TECHNETIUM TC 99M TETROFOSMIN IV KIT
10.3000 | PACK | Freq: Once | INTRAVENOUS | Status: AC | PRN
  Administered 2017-09-08: 10.3 via INTRAVENOUS
  Filled 2017-09-08: qty 11

## 2017-10-18 ENCOUNTER — Ambulatory Visit: Payer: PPO | Admitting: Cardiology

## 2017-10-18 ENCOUNTER — Encounter: Payer: Self-pay | Admitting: Cardiology

## 2017-10-18 VITALS — BP 108/60 | HR 69 | Ht 75.0 in | Wt 167.6 lb

## 2017-10-18 DIAGNOSIS — I482 Chronic atrial fibrillation: Secondary | ICD-10-CM | POA: Diagnosis not present

## 2017-10-18 DIAGNOSIS — I2510 Atherosclerotic heart disease of native coronary artery without angina pectoris: Secondary | ICD-10-CM

## 2017-10-18 DIAGNOSIS — I4821 Permanent atrial fibrillation: Secondary | ICD-10-CM

## 2017-10-18 DIAGNOSIS — I10 Essential (primary) hypertension: Secondary | ICD-10-CM

## 2017-10-18 DIAGNOSIS — E7800 Pure hypercholesterolemia, unspecified: Secondary | ICD-10-CM

## 2017-10-18 HISTORY — DX: Essential (primary) hypertension: I10
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
## 2017-10-18 NOTE — Patient Instructions (Signed)
Medication Instructions:  Your physician recommends that you continue on your current medications as directed. Please refer to the Current Medication list given to you today.  If you need a refill on your cardiac medications, please contact your pharmacy first.  Labwork: Today for complete blood count, and kidney function test   Testing/Procedures: None ordered   Follow-Up: Your physician wants you to follow-up in: 6 months with Dr. Radford Pax. You will receive a reminder letter in the mail two months in advance. If you don't receive a letter, please call our office to schedule the follow-up appointment.  Any Other Special Instructions Will Be Listed Below (If Applicable).   Thank you for choosing [facility], RN  [phone]  If you need a refill on your cardiac medications before your next appointment, please call your pharmacy.

## 2017-10-19 LAB — BASIC METABOLIC PANEL
BUN / CREAT RATIO: 18 (ref 10–24)
BUN: 17 mg/dL (ref 8–27)
CO2: 26 mmol/L (ref 20–29)
CREATININE: 0.92 mg/dL (ref 0.76–1.27)
Calcium: 9.2 mg/dL (ref 8.6–10.2)
Chloride: 102 mmol/L (ref 96–106)
GFR calc Af Amer: 96 mL/min/{1.73_m2} (ref >59)
GFR calc non Af Amer: 83 mL/min/{1.73_m2} (ref >59)
GLUCOSE: 88 mg/dL (ref 65–99)
Potassium: 4.5 mmol/L (ref 3.5–5.2)
Sodium: 139 mmol/L (ref 134–144)

## 2017-10-19 LAB — CBC
Hematocrit: 37.4 % — ABNORMAL LOW (ref 37.5–51.0)
Hemoglobin: 11.9 g/dL — ABNORMAL LOW (ref 13.0–17.7)
MCH: 33 pg (ref 26.6–33.0)
MCHC: 31.8 g/dL (ref 31.5–35.7)
MCV: 104 fL — AB (ref 79–97)
Platelets: 233 10*3/uL (ref 150–450)
RBC: 3.61 x10E6/uL — ABNORMAL LOW (ref 4.14–5.80)
RDW: 14.5 % (ref 12.3–15.4)
WBC: 6.6 10*3/uL (ref 3.4–10.8)

## 2017-10-20 ENCOUNTER — Ambulatory Visit: Payer: PPO | Admitting: Family Medicine

## 2017-11-25 ENCOUNTER — Encounter: Payer: Self-pay | Admitting: Family Medicine

## 2017-12-07 DIAGNOSIS — H43813 Vitreous degeneration, bilateral: Secondary | ICD-10-CM | POA: Diagnosis not present

## 2017-12-07 DIAGNOSIS — H40013 Open angle with borderline findings, low risk, bilateral: Secondary | ICD-10-CM | POA: Diagnosis not present

## 2017-12-07 DIAGNOSIS — H35033 Hypertensive retinopathy, bilateral: Secondary | ICD-10-CM | POA: Diagnosis not present

## 2017-12-07 DIAGNOSIS — Z961 Presence of intraocular lens: Secondary | ICD-10-CM | POA: Diagnosis not present

## 2018-03-08 DIAGNOSIS — M1812 Unilateral primary osteoarthritis of first carpometacarpal joint, left hand: Secondary | ICD-10-CM | POA: Diagnosis not present

## 2018-03-15 ENCOUNTER — Other Ambulatory Visit: Payer: Self-pay

## 2018-03-15 ENCOUNTER — Ambulatory Visit (HOSPITAL_COMMUNITY): Payer: PPO | Attending: Cardiology

## 2018-03-15 DIAGNOSIS — I272 Pulmonary hypertension, unspecified: Secondary | ICD-10-CM

## 2018-03-19 ENCOUNTER — Telehealth: Payer: Self-pay

## 2018-03-19 DIAGNOSIS — I272 Pulmonary hypertension, unspecified: Secondary | ICD-10-CM

## 2018-03-19 NOTE — Telephone Encounter (Signed)
Spoke with the patient about his echo results. He accepted and had no further questions. Result sent to Dr. Lake Bells to review. Repeat echo placed for 1 year.

## 2018-03-19 NOTE — Telephone Encounter (Signed)
-----   Message from Sueanne Margarita, MD sent at 03/15/2018  2:41 PM EDT ----- Please let patient know that echo showed normal LVF with mildly thickened heart muscle, mildly leaky MV and mildly calcified AV leaflets which are normal for his age.  There is moderate elevated BP in the arteries in the lungs.  This is likely related to underlying lung dz. Please forward this to Dr. Lake Bells for review.  Please repeat echo in 1 year for pulmonary HTN

## 2018-06-07 DIAGNOSIS — M1812 Unilateral primary osteoarthritis of first carpometacarpal joint, left hand: Secondary | ICD-10-CM | POA: Diagnosis not present

## 2018-07-05 ENCOUNTER — Other Ambulatory Visit: Payer: Self-pay | Admitting: Cardiology

## 2018-07-06 NOTE — Telephone Encounter (Signed)
Pt pharmacy has requested a refill on Xarelto. Please address.

## 2018-07-06 NOTE — Telephone Encounter (Signed)
Xarelto 20mg  refill request received; pt is 73 yrs old, wt-76kg, Crea-0.92 on 10/18/2017, last seen by Dr. Radford Pax on 10/18/2017, CrCl-76.39ml/min; will send in refill to requested pharmacy.

## 2018-08-02 DIAGNOSIS — M1812 Unilateral primary osteoarthritis of first carpometacarpal joint, left hand: Secondary | ICD-10-CM | POA: Diagnosis not present

## 2018-08-29 DIAGNOSIS — D485 Neoplasm of uncertain behavior of skin: Secondary | ICD-10-CM | POA: Diagnosis not present

## 2018-09-10 ENCOUNTER — Encounter: Payer: Self-pay | Admitting: Internal Medicine

## 2018-09-17 ENCOUNTER — Other Ambulatory Visit: Payer: Self-pay | Admitting: Cardiology

## 2018-09-17 ENCOUNTER — Telehealth: Payer: Self-pay

## 2018-09-17 NOTE — Telephone Encounter (Signed)
Patient received his colonoscopy recall letter and after consulting with Dr Carlean Purl I called and left him a voicemail to call us back and set up either a phone or zoom meeting to discuss colonoscopy testing.

## 2018-09-20 ENCOUNTER — Encounter: Payer: Self-pay | Admitting: General Surgery

## 2018-09-24 ENCOUNTER — Ambulatory Visit (INDEPENDENT_AMBULATORY_CARE_PROVIDER_SITE_OTHER): Payer: PPO | Admitting: Internal Medicine

## 2018-09-24 ENCOUNTER — Encounter: Payer: Self-pay | Admitting: Internal Medicine

## 2018-09-24 ENCOUNTER — Other Ambulatory Visit: Payer: Self-pay

## 2018-09-24 VITALS — Ht 75.0 in | Wt 160.0 lb

## 2018-09-24 DIAGNOSIS — Z7901 Long term (current) use of anticoagulants: Secondary | ICD-10-CM | POA: Diagnosis not present

## 2018-09-24 DIAGNOSIS — J479 Bronchiectasis, uncomplicated: Secondary | ICD-10-CM

## 2018-09-24 DIAGNOSIS — J432 Centrilobular emphysema: Secondary | ICD-10-CM

## 2018-09-24 DIAGNOSIS — Z86010 Personal history of colonic polyps: Secondary | ICD-10-CM

## 2018-09-24 DIAGNOSIS — I4821 Permanent atrial fibrillation: Secondary | ICD-10-CM

## 2018-09-24 NOTE — Assessment & Plan Note (Signed)
Change recall to 07/2019 Hopefully by then will have better protocols and protection in place for Covid-19. He has sig cardiopulmonary disease and would be at higher risk of death from Covid-19 than most so we have decided to reassess later as above. He knows to contact me if having any major bleeding, bowel changes, pain, etc

## 2018-09-24 NOTE — Patient Instructions (Signed)
It was good to speak to you today said and I am glad from a health perspective things are stable.  As we discussed, you and I both think it makes sense to delay considering a repeat colonoscopy until next year so I anticipate contacting you again around March or April to discuss this.  If you develop significant changes in your bowel habits, significant bleeding other than that little hemorrhoid bothering you from time to time, bad persistent abdominal pain, please contact me before we contact you.  Continue to stay safe and as well as you can.  I appreciate the opportunity to care for you. Gatha Mayer, MD, [facility]
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
## 2018-09-26 ENCOUNTER — Ambulatory Visit: Payer: Self-pay

## 2018-09-26 ENCOUNTER — Other Ambulatory Visit: Payer: Self-pay

## 2018-09-26 ENCOUNTER — Ambulatory Visit (INDEPENDENT_AMBULATORY_CARE_PROVIDER_SITE_OTHER): Payer: PPO

## 2018-09-26 ENCOUNTER — Encounter: Payer: Self-pay | Admitting: Acute Care

## 2018-09-26 ENCOUNTER — Ambulatory Visit: Payer: PPO | Admitting: Acute Care

## 2018-09-26 ENCOUNTER — Ambulatory Visit: Payer: PPO

## 2018-09-26 VITALS — BP 126/64 | HR 64 | Temp 98.1°F | Ht 75.0 in | Wt 169.4 lb

## 2018-09-26 DIAGNOSIS — I270 Primary pulmonary hypertension: Secondary | ICD-10-CM

## 2018-09-26 DIAGNOSIS — I2510 Atherosclerotic heart disease of native coronary artery without angina pectoris: Secondary | ICD-10-CM | POA: Diagnosis not present

## 2018-09-26 DIAGNOSIS — I272 Pulmonary hypertension, unspecified: Secondary | ICD-10-CM

## 2018-09-26 DIAGNOSIS — R0602 Shortness of breath: Secondary | ICD-10-CM | POA: Diagnosis not present

## 2018-09-26 DIAGNOSIS — J849 Interstitial pulmonary disease, unspecified: Secondary | ICD-10-CM

## 2018-09-26 DIAGNOSIS — R0600 Dyspnea, unspecified: Secondary | ICD-10-CM | POA: Diagnosis not present

## 2018-09-26 DIAGNOSIS — J479 Bronchiectasis, uncomplicated: Secondary | ICD-10-CM | POA: Diagnosis not present

## 2018-09-26 MED ORDER — LEVOFLOXACIN 500 MG PO TABS: 500.0000 mg | ORAL_TABLET | Freq: Every day | ORAL | 0 refills | Status: DC

## 2018-09-26 NOTE — Progress Notes (Signed)
History of Present Illness Troy Willis is a 73 y.o. male never smoker  with NSIP and bronchiectasis. He is followed by Dr. Lake Bells  Synopsis: Evaluated in 2017 by [facility] for an ill-defined interstitial lung disease and bronchiectasis which is felt to be related to a prior severe lung infection. He also has mild emphysema on CT chest and pulmonary nodules.  He had a severe  Alpha 1 testing 02/2016 > M/MAugust 2017 CT chest showed findings suggestive of nonspecific and distal pneumonitis with cylindrical bronchiectasis, mucous plugging and mild paraseptal and centrilobular emphysema. Three-vessel coronary disease noted. Stable pulmonary nodules. He has known coronary artery disease and a history of a stent.   Upper endoscopy in 2013 showed evidence of esophagitis Last Echo 02/2018>> PAP 48 mm Hg   09/26/2018 Pt. Presents to the office with his wife for follow up.He states he was  doing well through March. Then he has noticed some increase in shortness of breath. He states he has not been to see Dr. Lake Bells since 03/2017. He has noticed that with the shortness of breath he is  also  having a dry non-productive cough. He notices the shortness of breath while climbing stairs and working in the yard. He states that when he walks on flat surface he is better. He does walk afew miles daily.He states that he is walking less than he had in the past as his wife has had recent knee replacement surgery, and they have been focused on her rehab. He has a worsening sensitivity to pollen or dust.He feels they cause him to cough more now than in the past. Afew weeks ago he did have some light brown secretions. He notes that he has had 2 siblings die from hypoxemic illnesses. One was a smoker, the other was not.He was told by his cardiologist that he has pulmonary HTN. He has noticed some wheezing when he lies down.He takes zantac as needed for reflux. He is not on a scheduled dose. He has not  used  his flutter valve. He does not feel he is aspirating while he eats.He denies fever, chest pain, orthopnea   Test Results:  CXR 09/26/2018 IMPRESSION: No active cardiopulmonary disease.  Echo 02/2018 Left ventricle: The cavity size was normal. There was mild   concentric hypertrophy. Systolic function was normal. The   estimated ejection fraction was in the range of 60% to 65%. Wall   motion was normal; there were no regional wall motion   abnormalities. - Aortic valve: Trileaflet; mildly thickened, mildly calcified   leaflets. - Mitral valve: There was mild regurgitation. - Left atrium: The atrium was moderately dilated. - Right atrium: The atrium was moderately dilated. - Pulmonary arteries: PA peak pressure: 48 mm Hg (S).  PFT November 2017  FVC 4.0 L 76% predicted, no airflow obstruction, total lung capacity 5.91 L 73% predicted, DLCO 22.12 Oct 2016 ratio normal, FEV1 3.04 L 79% predicted, FVC 4.25 L 81% protected, total lung capacity 6.30 L 78% productive, DLCO 17.90 45% predicted November 2018: Forced vital capacity 4.01 L 79% predicted, total lung capacity 6.30 L 80% predicted, DLCO 17.46 mL 46% predicted  6MW: 09/2016 660M, O2 saturation 99% 03/2017 600M, O2 saturation 100% 09/26/2018:  714 feet, O2 saturation 97%>> was wearing mask   CBC Latest Ref Rng & Units 10/18/2017 02/14/2017 04/01/2016  WBC 3.4 - 10.8 x10E3/uL 6.6 6.1 4.9  Hemoglobin 13.0 - 17.7 g/dL 11.9(L) 12.4(L) 12.5(L)  Hematocrit 37.5 - 51.0 % 37.4(L) 37.2(L) 37.8(L)  Platelets 150 - 450 x10E3/uL 233 204 198    BMP Latest Ref Rng & Units 10/18/2017 02/14/2017 10/07/2016  Glucose 65 - 99 mg/dL 88 98 98  BUN 8 - 27 mg/dL 17 20 18   Creatinine 0.76 - 1.27 mg/dL 0.92 0.85 0.89  BUN/Creat Ratio 10 - 24 18 24 20   Sodium 134 - 144 mmol/L 139 139 139  Potassium 3.5 - 5.2 mmol/L 4.5 5.1 4.7  Chloride 96 - 106 mmol/L 102 101 101  CO2 20 - 29 mmol/L 26 25 25   Calcium 8.6 - 10.2 mg/dL 9.2 9.3 9.1    BNP No results  found for: BNP  ProBNP No results found for: PROBNP  PFT    Component Value Date/Time   FEV1PRE 2.80 04/13/2017 1056   FEV1POST 2.94 04/13/2017 1056   FVCPRE 3.97 04/13/2017 1056   FVCPOST 4.01 04/13/2017 1056   TLC 6.30 04/13/2017 1056   DLCOUNC 17.46 04/13/2017 1056   PREFEV1FVCRT 70 04/13/2017 1056   PSTFEV1FVCRT 73 04/13/2017 1056    Dg Chest 2 View  Result Date: 09/26/2018 CLINICAL DATA:  Shortness of breath. EXAM: CHEST - 2 VIEW COMPARISON:  Radiographs of July 09, 2012. FINDINGS: The heart size and mediastinal contours are within normal limits. Both lungs are clear. No pneumothorax or pleural effusion is noted. The visualized skeletal structures are unremarkable. IMPRESSION: No active cardiopulmonary disease. Electronically Signed   By: Marijo Conception M.D.   On: 09/26/2018 11:50     Past medical hx Past Medical History:  Diagnosis Date  . Anemia   . Arthritis    hands, lower back  . CAD (coronary artery disease), native coronary artery    a. 08/2015 Cath/PCI: LM nl, LAD 72m, LCX small, nl, RCA 80p (4.0x16 Synergy DES),   . Cataracts, bilateral    surgery  . Depression   . Diverticulosis   . GERD (gastroesophageal reflux disease)   . Hilar adenopathy    a. 08/2015 CT Chest: mild bilat hilar adenopathy and mild soft tissue prominence in inf right hilum, left hilum. Also areas of soft tissue and low-density material filling LLL bronchial airways->? mucous vs mass.  [name] Hx of adenomatous colonic polyps 08/13/2015  . Hyperlipidemia   . MVP (mitral valve prolapse)    bileaflet with mild MR by echo 02/2017  . Nonspecific elevation of levels of transaminase or lactic acid dehydrogenase (LDH)    PMH of ; ? due to Amiodarone   . 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 Permanent atrial fibrillation    a. CHA2DS2VASc = 2 -->Xarelto started 08/2015.  [name] Pneumonia, bacterial 05/18/2012  . Pulmonary nodules    a. 08/2015 CT Chest: reticulonodular opacities in RUL - largest 59mm -  likely 2/2 inflammatory process.     Social History   Tobacco Use  . Smoking status: Never Smoker  . Smokeless tobacco: Former Systems developer    Types: Chew  . Tobacco comment: occasional cigar in past; not regular smoker  Substance Use Topics  . Alcohol use: Yes    Alcohol/week: 1.0 standard drinks    Types: 1 Cans of beer per week    Comment: 4/week  . Drug use: No    Mr.Badgett reports that he has never smoked. He has quit using smokeless tobacco.  His smokeless tobacco use included chew. He reports current alcohol use of about 1.0 standard drinks of alcohol per week. He reports that he does not use drugs.  Tobacco Cessation: Never Smoker  Past surgical hx, Family hx,  Social hx all reviewed.  Current Outpatient Medications on File Prior to Visit  Medication Sig  . atorvastatin (LIPITOR) 80 MG tablet TAKE 1 TABLET BY MOUTH DAILY AT 6PM **PLEASE MAKE APPOINTMENT FOR JUNE**  . Biotin 1000 MCG tablet Take 1,000 mcg by mouth daily.  . Celecoxib (CELEBREX PO) Take by mouth as needed.  . Cholecalciferol (VITAMIN D3) 2000 UNITS TABS Take 1 tablet by mouth daily.   . Multiple Vitamin (MULTIVITAMIN) tablet Take 1 tablet by mouth daily.  . nitroGLYCERIN (NITROSTAT) 0.4 MG SL tablet Place 1 tablet (0.4 mg total) under the tongue every 5 (five) minutes x 3 doses as needed for chest pain.  . ranitidine (ZANTAC) 150 MG tablet Take 150 mg by mouth as needed for heartburn (Take as directed).   [name] Respiratory Therapy Supplies (FLUTTER) DEVI Use as directed  . XARELTO 20 MG TABS tablet TAKE ONE TABLET BY MOUTH ONE TIME DAILY WITH SUPPER  . Calcium-Magnesium-Vitamin D (CALCIUM MAGNESIUM PO) Take 1 capsule by mouth every evening.   No current facility-administered medications on file prior to visit.      Allergies  Allergen Reactions  . Amiodarone Other (See Comments)    REACTION: elevated liver enzymes  . Metoprolol Other (See Comments)    Hypotension   . Prevacid [Lansoprazole] Other (See  Comments)    Cannot take with plavix    Review Of Systems:  Constitutional:   No  weight loss, night sweats,  Fevers, chills, fatigue, or  lassitude.  HEENT:   No headaches,  Difficulty swallowing,  Tooth/dental problems, or  Sore throat,                No sneezing, itching, ear ache, +nasal congestion,+ post nasal drip,   CV:  No chest pain,  Orthopnea, PND, swelling in lower extremities, anasarca, dizziness, palpitations, syncope.   GI  No heartburn, indigestion, abdominal pain, nausea, vomiting, diarrhea, change in bowel habits, loss of appetite, bloody stools.   Resp: + shortness of breath with exertion less at rest.  No excess mucus, no productive cough,  + non-productive cough,  No coughing up of blood.  No change in color of mucus.  +  Wheezing at bedtime.  No chest wall deformity  Skin: no rash or lesions.  GU: no dysuria, change in color of urine, no urgency or frequency.  No flank pain, no hematuria   MS:  No joint pain or swelling.  No decreased range of motion.  No back pain.  Psych:  No change in mood or affect. No depression or anxiety.  No memory loss.   Vital Signs BP 126/64 (BP Location: Right Arm, Patient Position: Sitting, Cuff Size: Normal)   Pulse 64   Temp 98.1 F (36.7 C)   Ht 6\' 3"  (1.905 m)   Wt 169 lb 6.4 oz (76.8 kg)   SpO2 98%   BMI 21.17 kg/m    Physical Exam:  General- No distress,  A&Ox3, pleasant and appropriate ENT: No sinus tenderness, TM clear, pale nasal mucosa, no oral exudate,no post nasal drip, no LAN Cardiac: S1, S2, regular rate and rhythm, no murmur Chest: No wheeze/ rales/ dullness; no accessory muscle use, no nasal flaring, no sternal retractions Abd.: Soft Non-tender, ND, BS +, Body mass index is 21.17 kg/m. Ext: No clubbing cyanosis, edema Neuro:  normal strength, MAE x 4, A&O x 3 Skin: No rashes, no lesions, warm and dry Psych: normal mood and behavior, appropriate   Assessment/Plan  ILD (interstitial lung  disease)  ([place]) New onset SOB with exertion over last several months No follow up since 2018 Plan We will do a 6 minute walk today. We will do an EKG today>> monitor QTc prior to Levaquin therapy CXR today We will call you with results. We will schedule you for PFT's to evaluate for progression of disease at the hospital. You may need to be tested for COVID prior.  The hospital will let you know the protocol when they call you to schedule. We will treat you with Levaquin 500 mg once daily x 7 days. We will schedule a HRCT to evaluate for progression of disease at follow up based on above diagnostics ? Bronch vs biopsy for definitive diagnosis Use Flutter Valve as needed Mucinex 1200 mg once daily with a full glass of water Follow up in 2 weeks after antibiotic treatment is completed with [name] NP. Follow up sooner if you need [place] sooner. We are here for you.  Please contact office for sooner follow up if symptoms do not improve or worsen or seek emergency care    Bronchiectasis without acute exacerbation ([place]) New shortness of breath with non-productive cough Plan We will do an EKG today CXR today We will call you with results. We will schedule you for PFT's to evaluate for progression of disease at the hospital. You may need to be tested for COVID prior.  You may need to Quarantine while awaiting test results The hospital will let you know the protocol when they call you to schedule. We will treat you with Levaquin 500 mg once daily x 7 days. Use Flutter Valve as needed Mucinex 1200 mg once daily with a full glass of water Follow up in 2 weeks after antibiotic treatment is completed with [name] NP. Follow up sooner if you need [place] sooner. We are here for you.  Please contact office for sooner follow up if symptoms do not improve or worsen or seek emergency care    Pulmonary HTN ([place]) Per last echo 02/2018 PAP 48 mm Hg Concern this may be contributing to his newly  progressive shortness  of breath Plan Consider R heart cath Consider HRCT    [name], NP 09/26/2018  1:28 PM

## 2018-09-26 NOTE — Assessment & Plan Note (Signed)
Per last echo 02/2018 PAP 48 mm Hg Concern this may be contributing to his newly  progressive shortness of breath Plan Consider R heart cath Consider HRCT

## 2018-09-26 NOTE — Assessment & Plan Note (Signed)
New shortness of breath with non-productive cough Plan We will do an EKG today CXR today We will call you with results. We will schedule you for PFT's to evaluate for progression of disease at the hospital. You may need to be tested for COVID prior.  You may need to Quarantine while awaiting test results The hospital will let you know the protocol when they call you to schedule. We will treat you with Levaquin 500 mg once daily x 7 days. Use Flutter Valve as needed Mucinex 1200 mg once daily with a full glass of water Follow up in 2 weeks after antibiotic treatment is completed with [name] NP. Follow up sooner if you need [place] sooner. We are here for you.  Please contact office for sooner follow up if symptoms do not improve or worsen or seek emergency care

## 2018-09-26 NOTE — Patient Instructions (Addendum)
It is nice to see you today. We will do a 6 minute walk today. We will do an EKG today CXR today We will call you with results. We will schedule you for PFT's to evaluate for progression of disease at the hospital. You may need to be tested for COVID prior.  The hospital will let you know the protocol when they call you to schedule. We will treat you with Levaquin 500 mg once daily x 7 days. We will schedule a HRCT to evaluate for progression of disease at follow up based on above diagnostics Use Flutter Valve as needed Mucinex 1200 mg once daily with a full glass of water Follow up in 2 weeks after antibiotic treatment is completed with [name] NP. Follow up sooner if you need [place] sooner. We are here for you.  Please contact office for sooner follow up if symptoms do not improve or worsen or seek emergency care

## 2018-09-26 NOTE — Progress Notes (Signed)
SIX MIN WALK 09/26/2018 03/30/2017 10/07/2016 09/25/2015  Medications none atorvastatin 80mg  and rivaroxaban 20mg  were taken last night (patient unsure of what time).  pt has not taken any medications this morning.  -  Supplimental Oxygen during Test? (L/min) No No No No  Laps 21 12 13  -  Partial Lap (in Meters) 0 24 36 -  Baseline BP (sitting) 122/70 114/72 122/68 -  Baseline Heartrate 64 74 66 -  Baseline Dyspnea (Borg Scale) 0.5 0 0 -  Baseline Fatigue (Borg Scale) 0 0 0 -  Baseline SPO2 99 97 100 -  BP (sitting) 130/72 122/84 150/80 -  Heartrate 130 115 111 -  Dyspnea (Borg Scale) 5 2 1  -  Fatigue (Borg Scale) 0 1 2 -  SPO2 97 97 97 -  BP (sitting) 124/70 116/76 142/72 -  Heartrate 79 75 62 -  SPO2 99 100 99 -  Stopped or Paused before Six Minutes No No No -  Distance Completed [phone] -  Tech Comments: very fast walk, great efforrt from patient/mask worn/no complications TA/CMA Patient was able to walk at a moderate pace, almost a sprint. Denied any symptoms of CP, SOB, or extremity pain. Patient did not stop during test.  pt completed test at rapid pace with no desats or complaints.  no complaints

## 2018-09-26 NOTE — Assessment & Plan Note (Signed)
New onset SOB with exertion over last several months No follow up since 2018 Plan We will do a 6 minute walk today. We will do an EKG today>> monitor QTc prior to Levaquin therapy CXR today We will call you with results. We will schedule you for PFT's to evaluate for progression of disease at the hospital. You may need to be tested for COVID prior.  The hospital will let you know the protocol when they call you to schedule. We will treat you with Levaquin 500 mg once daily x 7 days. We will schedule a HRCT to evaluate for progression of disease at follow up based on above diagnostics ? Bronch vs biopsy for definitive diagnosis Use Flutter Valve as needed Mucinex 1200 mg once daily with a full glass of water Follow up in 2 weeks after antibiotic treatment is completed with [name] NP. Follow up sooner if you need [place] sooner. We are here for you.  Please contact office for sooner follow up if symptoms do not improve or worsen or seek emergency care

## 2018-09-26 NOTE — Progress Notes (Signed)
Reviewed, agree 

## 2018-09-27 DIAGNOSIS — L570 Actinic keratosis: Secondary | ICD-10-CM | POA: Diagnosis not present

## 2018-09-27 DIAGNOSIS — D485 Neoplasm of uncertain behavior of skin: Secondary | ICD-10-CM | POA: Diagnosis not present

## 2018-10-10 ENCOUNTER — Ambulatory Visit: Payer: PPO | Admitting: Acute Care

## 2018-10-10 ENCOUNTER — Other Ambulatory Visit: Payer: Self-pay

## 2018-10-10 ENCOUNTER — Encounter: Payer: Self-pay | Admitting: Acute Care

## 2018-10-10 VITALS — BP 116/62 | HR 82 | Ht 75.0 in | Wt 168.0 lb

## 2018-10-10 DIAGNOSIS — R911 Solitary pulmonary nodule: Secondary | ICD-10-CM | POA: Diagnosis not present

## 2018-10-10 DIAGNOSIS — R0602 Shortness of breath: Secondary | ICD-10-CM

## 2018-10-10 DIAGNOSIS — I272 Pulmonary hypertension, unspecified: Secondary | ICD-10-CM | POA: Diagnosis not present

## 2018-10-10 DIAGNOSIS — J849 Interstitial pulmonary disease, unspecified: Secondary | ICD-10-CM | POA: Diagnosis not present

## 2018-10-10 DIAGNOSIS — J479 Bronchiectasis, uncomplicated: Secondary | ICD-10-CM

## 2018-10-10 NOTE — Progress Notes (Signed)
History of Present Illness Troy Willis is a 73 y.o. male never smoker  with NSIP, PAH  and bronchiectasis. He is followed by Dr. Lake Bells   Synopsis: Synopsis: Evaluated in 2017 by [facility] for an ill-defined interstitial lung disease and bronchiectasis which is felt to be related to a prior severe lung infection. He also has mild emphysema on CT chest and pulmonary nodules. He had a severe  Alpha 1 testing 02/2016 > M/MAugust 2017 CT chest showed findings suggestive of nonspecific and distal pneumonitis with cylindrical bronchiectasis, mucous plugging and mild paraseptal and centrilobular emphysema. Three-vessel coronary disease noted. Stable pulmonary nodules. He has known coronary artery disease and a history of a stent.  Upper endoscopy in 2013 showed evidence of esophagitis Last Echo 02/2018>> PAP 48 mm Hg   10/10/2018 Follow up OV Pt. Was seen 09/26/2018 for increase in shortness of breath and increase in his dry, non-productive/ slightly productive  Cough with scant light brown secretions. He was treated with Levaquin 500 mg daily x 7 days, Mucinex for Mucolytic, and flutter valve 2-3 times daily. The patient was compliant with all treatment. [name] He returns today stating his  Cough is gone and he has had improvement in his shortness of breath, but it is not completely gone. He has been able to walk without dyspnea on flat surfaces, but gets winded with incline.Secretions when he has them are clear.We discussed that his dyspnea could be a result of NSIP, his bronchiectasis or possible pulmonary HTN. His PAP have been increasing with his last cath 02/2018 ( PA peak pressure: 48 mm Hg (S). Dt. Radford Pax had planned on repeat echo 02/2019, but we need to consider repeating sooner as patient has had worsening dyspnea.He is interested in Pulmonary Rehab once it opens up again.He denies ant lower extremity edema.His weight remains stable at 168 pounds.He denies fever, chest pain,  orthopnea or hemoptysis.    Test Results:  CXR 09/26/2018 IMPRESSION: No active cardiopulmonary disease.  Echo 02/2018 Left ventricle: The cavity size was normal. There was mild concentric hypertrophy. Systolic function was normal. The estimated ejection fraction was in the range of 60% to 65%. Wall motion was normal; there were no regional wall motion abnormalities. - Aortic valve: Trileaflet; mildly thickened, mildly calcified leaflets. - Mitral valve: There was mild regurgitation. - Left atrium: The atrium was moderately dilated. - Right atrium: The atrium was moderately dilated. - Pulmonary arteries: PA peak pressure: 48 mm Hg (S).  PFT November 2017 FVC 4.0 L 76% predicted, no airflow obstruction, total lung capacity 5.91 L 73% predicted, DLCO 22.12 Oct 2016 ratio normal, FEV1 3.04 L 79% predicted, FVC 4.25 L 81% protected, total lung capacity 6.30 L 78% productive, DLCO 17.90 45% predicted November 2018: Forced vital capacity 4.01 L 79% predicted, total lung capacity 6.30 L 80% predicted, DLCO 17.46 mL 46% predicted  6MW: 09/2016 660M,O2 saturation99% 03/2017 600M,O2 saturation100% 09/26/2018:  714 feet, O2 saturation 97%>> was wearing mask  HRCT 12/2015 IMPRESSION: The appearance of the chest appears very similar to the prior examination, with lung findings that are again most suggestive of mild nonspecific interstitial pneumonia (NSIP). Mild diffuse bronchial wall thickening with mild centrilobular and paraseptal emphysema. Cardiomegaly with biatrial dilatation. Aortic atherosclerosis, in addition to left main and 3 vessel coronary artery disease. Please note that although the presence of coronary artery calcium documents the presence of coronary artery disease, the severity of this disease and any potential stenosis cannot be assessed on this non-gated CT  examination. Assessment for potential risk factor modification, dietary therapy or  pharmacologic therapy may be warranted, if clinically indicated. Multiple tiny pulmonary nodules are again noted, stable compared to the prior study, most compatible with areas of chronic mucoid impaction within terminal bronchioles. These all measure 6 mm or less in size. Repeat noncontrast chest CT could be considered in 12-18 months to ensure continued stability these findings.   CBC Latest Ref Rng & Units 10/18/2017 02/14/2017 04/01/2016  WBC 3.4 - 10.8 x10E3/uL 6.6 6.1 4.9  Hemoglobin 13.0 - 17.7 g/dL 11.9(L) 12.4(L) 12.5(L)  Hematocrit 37.5 - 51.0 % 37.4(L) 37.2(L) 37.8(L)  Platelets 150 - 450 x10E3/uL 233 204 198    BMP Latest Ref Rng & Units 10/18/2017 02/14/2017 10/07/2016  Glucose 65 - 99 mg/dL 88 98 98  BUN 8 - 27 mg/dL 17 20 18   Creatinine 0.76 - 1.27 mg/dL 0.92 0.85 0.89  BUN/Creat Ratio 10 - 24 18 24 20   Sodium 134 - 144 mmol/L 139 139 139  Potassium 3.5 - 5.2 mmol/L 4.5 5.1 4.7  Chloride 96 - 106 mmol/L 102 101 101  CO2 20 - 29 mmol/L 26 25 25   Calcium 8.6 - 10.2 mg/dL 9.2 9.3 9.1    BNP No results found for: BNP  ProBNP No results found for: PROBNP  PFT    Component Value Date/Time   FEV1PRE 2.80 04/13/2017 1056   FEV1POST 2.94 04/13/2017 1056   FVCPRE 3.97 04/13/2017 1056   FVCPOST 4.01 04/13/2017 1056   TLC 6.30 04/13/2017 1056   DLCOUNC 17.46 04/13/2017 1056   PREFEV1FVCRT 70 04/13/2017 1056   PSTFEV1FVCRT 73 04/13/2017 1056    Dg Chest 2 View  Result Date: 09/26/2018 CLINICAL DATA:  Shortness of breath. EXAM: CHEST - 2 VIEW COMPARISON:  Radiographs of July 09, 2012. FINDINGS: The heart size and mediastinal contours are within normal limits. Both lungs are clear. No pneumothorax or pleural effusion is noted. The visualized skeletal structures are unremarkable. IMPRESSION: No active cardiopulmonary disease. Electronically Signed   By: Marijo Conception M.D.   On: 09/26/2018 11:50     Past medical hx Past Medical History:  Diagnosis Date  . Anemia    . Arthritis    hands, lower back  . CAD (coronary artery disease), native coronary artery    a. 08/2015 Cath/PCI: LM nl, LAD 32m, LCX small, nl, RCA 80p (4.0x16 Synergy DES),   . Cataracts, bilateral    surgery  . Depression   . Diverticulosis   . GERD (gastroesophageal reflux disease)   . Hilar adenopathy    a. 08/2015 CT Chest: mild bilat hilar adenopathy and mild soft tissue prominence in inf right hilum, left hilum. Also areas of soft tissue and low-density material filling LLL bronchial airways->? mucous vs mass.  [name] Hx of adenomatous colonic polyps 08/13/2015  . Hyperlipidemia   . MVP (mitral valve prolapse)    bileaflet with mild MR by echo 02/2017  . Nonspecific elevation of levels of transaminase or lactic acid dehydrogenase (LDH)    PMH of ; ? due to Amiodarone   . 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 Permanent atrial fibrillation    a. CHA2DS2VASc = 2 -->Xarelto started 08/2015.  [name] Pneumonia, bacterial 05/18/2012  . Pulmonary nodules    a. 08/2015 CT Chest: reticulonodular opacities in RUL - largest 84mm - likely 2/2 inflammatory process.     Social History   Tobacco Use  . Smoking status: Never Smoker  . Smokeless tobacco: Former Systems developer  Types: Chew  . Tobacco comment: occasional cigar in past; not regular smoker  Substance Use Topics  . Alcohol use: Yes    Alcohol/week: 1.0 standard drinks    Types: 1 Cans of beer per week    Comment: 4/week  . Drug use: No    Mr.Burbach reports that he has never smoked. He has quit using smokeless tobacco.  His smokeless tobacco use included chew. He reports current alcohol use of about 1.0 standard drinks of alcohol per week. He reports that he does not use drugs.  Tobacco Cessation: Never cigarette smoker, occasional cigar smoker  Past surgical hx, Family hx, Social hx all reviewed.  Current Outpatient Medications on File Prior to Visit  Medication Sig  . atorvastatin (LIPITOR) 80 MG tablet TAKE 1 TABLET BY MOUTH DAILY AT 6PM  **PLEASE MAKE APPOINTMENT FOR JUNE**  . Biotin 1000 MCG tablet Take 1,000 mcg by mouth daily.  . Celecoxib (CELEBREX PO) Take by mouth as needed.  . Cholecalciferol (VITAMIN D3) 2000 UNITS TABS Take 1 tablet by mouth daily.   [name] levofloxacin (LEVAQUIN) 500 MG tablet Take 1 tablet (500 mg total) by mouth daily.  . Multiple Vitamin (MULTIVITAMIN) tablet Take 1 tablet by mouth daily.  . nitroGLYCERIN (NITROSTAT) 0.4 MG SL tablet Place 1 tablet (0.4 mg total) under the tongue every 5 (five) minutes x 3 doses as needed for chest pain.  . ranitidine (ZANTAC) 150 MG tablet Take 150 mg by mouth as needed for heartburn (Take as directed).   [name] Respiratory Therapy Supplies (FLUTTER) DEVI Use as directed  . XARELTO 20 MG TABS tablet TAKE ONE TABLET BY MOUTH ONE TIME DAILY WITH SUPPER   No current facility-administered medications on file prior to visit.      Allergies  Allergen Reactions  . Amiodarone Other (See Comments)    REACTION: elevated liver enzymes  . Metoprolol Other (See Comments)    Hypotension   . Prevacid [Lansoprazole] Other (See Comments)    Cannot take with plavix    Review Of Systems:  Constitutional:   No  weight loss, night sweats,  Fevers, chills, fatigue, or  lassitude.  HEENT:   No headaches,  Difficulty swallowing,  Tooth/dental problems, or  Sore throat,                No sneezing, itching, ear ache, nasal congestion, post nasal drip,   CV:  No chest pain,  Orthopnea, PND, swelling in lower extremities, anasarca, dizziness, palpitations, syncope.   GI  No heartburn, indigestion, abdominal pain, nausea, vomiting, diarrhea, change in bowel habits, loss of appetite, bloody stools.   Resp: + shortness of breath with up hill  exertion none  at rest.  No excess mucus, no productive cough,  No non-productive cough,  No coughing up of blood.  No change in color of mucus.  No wheezing.  No chest wall deformity  Skin: no rash or lesions.  GU: no dysuria, change in color of  urine, no urgency or frequency.  No flank pain, no hematuria   MS:  No joint pain or swelling.  No decreased range of motion.  No back pain.  Psych:  No change in mood or affect. No depression or anxiety.  No memory loss.   Vital Signs BP 116/62 (BP Location: Left Arm, Cuff Size: Normal)   Pulse 82   Ht 6\' 3"  (1.905 m)   Wt 168 lb (76.2 kg)   SpO2 95%   BMI 21.00 kg/m  Physical Exam:  General- No distress,  A&Ox3, pleasant  ENT: No sinus tenderness, TM clear, pale nasal mucosa, no oral exudate,no post nasal drip, no LAN Cardiac: S1, S2, regular rate and rhythm, no murmur Chest: No wheeze/ rales/ dullness; no accessory muscle use, no nasal flaring, no sternal retractions, + scant crackles per right base Abd.: Soft Non-tender, ND, BS + Ext: No clubbing cyanosis, edema Neuro:  normal strength, MAE x4, A&O x 3 Skin: No rashes, warm and dry, intact Psych: normal mood and behavior, appropriate   Assessment/Plan  Pulmonary HTN (HCC) Pulmonary HTN ([place]) Per last echo 02/2018 PAP 48 mm Hg Concern this may be contributing to his newly  progressive shortness of breath Plan HRCT to evaluate for progression of lung disease PFT's to evaluate for progression of lung disease Consider repeat Echo to evaluate for worsening  of PAH Follow up with Dr. Lake Bells after testing completed >> if appears to be PAH will need to initiate therapy Consider sleep evaluation COVID testing prior to HRCT/ PFT's  Bronchiectasis without acute exacerbation (HCC) Cough and sputum production improved after treatment with Levaquin Hx of pseudomonas sensitive to Levaquin Treated with Levaquin with good response this month Plan Continue Mucinex 1200 mg once daily with full glass of water Continue using the flutter valve twice daily. Follow up after HRCT and PFT's   ILD (interstitial lung disease) ([place]) Last HRCT 12/2015 New onset dyspnea ? Multifactorial 2/2 ILD/bronchiectsisi/ PAH 09/26/2018:  714  feet, O2 saturation 97%>> was wearing mask Plan HRCT for disease progression PFT's for disease progression Consider Pulmonary rehab   Solitary pulmonary nodule Follow Up [facility] 09/2018 to re-evaluate for stability/ gowth    [name], NP 10/10/2018  10:10 PM

## 2018-10-10 NOTE — Assessment & Plan Note (Signed)
Cough and sputum production improved after treatment with Levaquin Hx of pseudomonas sensitive to Levaquin Treated with Levaquin with good response this month Plan Continue Mucinex 1200 mg once daily with full glass of water Continue using the flutter valve twice daily. Follow up after HRCT and PFT's

## 2018-10-10 NOTE — Assessment & Plan Note (Addendum)
Pulmonary HTN ([place]) Per last echo 02/2018 PAP 48 mm Hg Concern this may be contributing to his newly  progressive shortness of breath Plan HRCT to evaluate for progression of lung disease PFT's to evaluate for progression of lung disease Consider repeat Echo to evaluate for worsening  of PAH Follow up with Dr. Lake Bells after testing completed >> if appears to be PAH will need to initiate therapy Consider sleep evaluation COVID testing prior to HRCT/ PFT's

## 2018-10-10 NOTE — Patient Instructions (Addendum)
We will schedule your HRCT and your PFT's Tou will need COVID testing prior. You will get a call from the office with instructions. We will order COVID antibody testing Continue Mucinex 1200 mg once daily with full glass of water Continue using the flutter valve twice daily. Continue using antacid for heartburn as you have been doing. We will consider Pulmonary Rehab once they re-open. Continue to exercise as you are able  Follow up after PFT's and HRCT to evaluate for progression of disease , and to re-evaluate pulmonary nodules noted on previous CT in 2017.[name] HRCT is scheduled for June 5 th at 2:30 pm I will message Dr. Lake Bells about Pulmonary HTN medications Please contact office for sooner follow up if symptoms do not improve or worsen or seek emergency care

## 2018-10-10 NOTE — Assessment & Plan Note (Addendum)
Last HRCT 12/2015 New onset dyspnea ? Multifactorial 2/2 ILD/bronchiectsisi/ PAH 09/26/2018:  714 feet, O2 saturation 97%>> was wearing mask Plan HRCT for disease progression PFT's for disease progression Consider Pulmonary rehab

## 2018-10-10 NOTE — Assessment & Plan Note (Signed)
Follow Up HCRT 09/2018 to re-evaluate for stability/ gowth

## 2018-10-11 NOTE — Progress Notes (Signed)
Reviewed, agree 

## 2018-10-13 LAB — SARS-COV-2 ANTIBODY, IGM: SARS-CoV-2 Antibody, IgM: NEGATIVE

## 2018-10-17 ENCOUNTER — Telehealth: Payer: Self-pay | Admitting: *Deleted

## 2018-10-17 NOTE — Telephone Encounter (Signed)

## 2018-10-19 ENCOUNTER — Ambulatory Visit (INDEPENDENT_AMBULATORY_CARE_PROVIDER_SITE_OTHER)
Admission: RE | Admit: 2018-10-19 | Discharge: 2018-10-19 | Disposition: A | Discharge status: Home / Self Care | Payer: PPO | Source: Ambulatory Visit | Attending: Acute Care | Admitting: Acute Care

## 2018-10-19 ENCOUNTER — Other Ambulatory Visit: Payer: Self-pay

## 2018-10-19 DIAGNOSIS — J849 Interstitial pulmonary disease, unspecified: Secondary | ICD-10-CM

## 2018-10-19 DIAGNOSIS — J439 Emphysema, unspecified: Secondary | ICD-10-CM | POA: Diagnosis not present

## 2018-10-19 DIAGNOSIS — J479 Bronchiectasis, uncomplicated: Secondary | ICD-10-CM | POA: Diagnosis not present

## 2018-10-19 DIAGNOSIS — R918 Other nonspecific abnormal finding of lung field: Secondary | ICD-10-CM | POA: Diagnosis not present

## 2018-10-24 ENCOUNTER — Telehealth: Payer: Self-pay

## 2018-10-24 NOTE — Telephone Encounter (Signed)
Mychart message sent by pt wanting to know when he can be placed on the schedule to have the PFT completed. SG is also wanting to know if we might have an idea on this as pt will need to be scheduled a follow up office visit once the PFT is completed.  Patrice, please advise on this for pt. Thanks!

## 2018-10-24 NOTE — Telephone Encounter (Signed)
Troy Willis, please see email and advise thanks!  Hello Ms. Elie Confer,  One more follow up question:   In our visit 10/10/18 we briefly discussed my Pulmonary Hypertension per my Ecocardiogram of 03/15/18 which showed PA peak pressure of 71mm Hg....one year earlier 03/03/81 PA peak pressure was 52mm Hg. I believe you said you would discuss that with Dr. Lake Bells whether we should medicate for that condition. Any feedback on that?  Thank you,  [name]  [phone] (cell)

## 2018-10-24 NOTE — Telephone Encounter (Signed)
Patrice can we schedule patient for a full PFT and follow up appt with SG or McQuaid.

## 2018-10-25 ENCOUNTER — Other Ambulatory Visit: Payer: Self-pay | Admitting: Pulmonary Disease

## 2018-10-25 NOTE — Telephone Encounter (Signed)
Called and scheduled patient PFT on 10/31/2018-pr

## 2018-10-25 NOTE — Telephone Encounter (Signed)
Pt is calling back to schedule a PFT [phone]

## 2018-10-29 ENCOUNTER — Other Ambulatory Visit (HOSPITAL_COMMUNITY): Payer: PPO

## 2018-10-29 ENCOUNTER — Other Ambulatory Visit (HOSPITAL_COMMUNITY)
Admission: RE | Admit: 2018-10-29 | Discharge: 2018-10-29 | Disposition: A | Discharge status: Home / Self Care | Payer: PPO | Source: Ambulatory Visit | Attending: Pulmonary Disease | Admitting: Pulmonary Disease

## 2018-10-29 DIAGNOSIS — Z1159 Encounter for screening for other viral diseases: Secondary | ICD-10-CM | POA: Insufficient documentation

## 2018-10-30 LAB — NOVEL CORONAVIRUS, NAA (HOSP ORDER, SEND-OUT TO REF LAB; TAT 18-24 HRS): SARS-CoV-2, NAA: NOT DETECTED

## 2018-10-31 ENCOUNTER — Encounter: Payer: Self-pay | Admitting: Acute Care

## 2018-10-31 ENCOUNTER — Other Ambulatory Visit: Payer: Self-pay

## 2018-10-31 ENCOUNTER — Ambulatory Visit (INDEPENDENT_AMBULATORY_CARE_PROVIDER_SITE_OTHER): Payer: PPO | Admitting: Pulmonary Disease

## 2018-10-31 ENCOUNTER — Ambulatory Visit: Payer: PPO | Admitting: Acute Care

## 2018-10-31 DIAGNOSIS — I272 Pulmonary hypertension, unspecified: Secondary | ICD-10-CM

## 2018-10-31 DIAGNOSIS — J849 Interstitial pulmonary disease, unspecified: Secondary | ICD-10-CM

## 2018-10-31 DIAGNOSIS — J479 Bronchiectasis, uncomplicated: Secondary | ICD-10-CM

## 2018-10-31 DIAGNOSIS — R911 Solitary pulmonary nodule: Secondary | ICD-10-CM | POA: Diagnosis not present

## 2018-10-31 DIAGNOSIS — I270 Primary pulmonary hypertension: Secondary | ICD-10-CM

## 2018-10-31 LAB — PULMONARY FUNCTION TEST
DL/VA % pred: 80 %
DL/VA: 3.13 ml/min/mmHg/L
DLCO unc % pred: 63 %
DLCO unc: 17.98 ml/min/mmHg
FEF 25-75 Post: 2.5 L/sec
FEF 25-75 Pre: 1.93 L/sec
FEF2575-%Change-Post: 29 %
FEF2575-%Pred-Post: 94 %
FEF2575-%Pred-Pre: 72 %
FEV1-%Change-Post: 5 %
FEV1-%Pred-Post: 84 %
FEV1-%Pred-Pre: 80 %
FEV1-Post: 3.05 L
FEV1-Pre: 2.9 L
FEV1FVC-%Change-Post: 2 %
FEV1FVC-%Pred-Pre: 99 %
FEV6-%Change-Post: 2 %
FEV6-%Pred-Post: 87 %
FEV6-%Pred-Pre: 85 %
FEV6-Post: 4.12 L
FEV6-Pre: 4.02 L
FEV6FVC-%Change-Post: 0 %
FEV6FVC-%Pred-Post: 104 %
FEV6FVC-%Pred-Pre: 105 %
FVC-%Change-Post: 3 %
FVC-%Pred-Post: 83 %
FVC-%Pred-Pre: 81 %
FVC-Post: 4.15 L
FVC-Pre: 4.03 L
Post FEV1/FVC ratio: 74 %
Post FEV6/FVC ratio: 99 %
Pre FEV1/FVC ratio: 72 %
Pre FEV6/FVC Ratio: 100 %
RV % pred: 89 %
RV: 2.46 L
TLC % pred: 81 %
TLC: 6.4 L

## 2018-10-31 NOTE — Assessment & Plan Note (Addendum)
Continued improvement after treatment with Levaquin Stable interval Plan Continue Mucinex daily Continue Flutter valve daily Call for any change in secretions to allow [place] to be proactive Follow up in 05/2019 or sooner if needed

## 2018-10-31 NOTE — Assessment & Plan Note (Signed)
New 7 mm Pulmonary Nodule noted per HRCT 10/2018 ? Post inflammatory vs atypical infection Plan  Repeat CT 04/2019

## 2018-10-31 NOTE — Progress Notes (Signed)
PFT performed today. 

## 2018-10-31 NOTE — Assessment & Plan Note (Addendum)
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 Contributing factor to dyspnea PFT's with improved DLCO Plan Follow up Echo 02/2019 Consider O&O and sleep study as possible etiology Diuretic therapy not indicated at this point Follow up in 05/2019 to review repeat CT and Echo

## 2018-10-31 NOTE — Assessment & Plan Note (Addendum)
HRCT confirms minimal progression DLCO improved per most recent PFT Improving dyspnea Plan: Continue exercising Follow up in 6 months with 6 minute walk  Consider Pulmonary Rehab

## 2018-10-31 NOTE — Patient Instructions (Addendum)
It is good to see you today. Your Pulmonary Function Test looks better. This is good news. We will schedule you for a repeat CT Chest at the end of December 2020 to follow up on the new pulmonary nodule noted on the June 2020 CT scan Continue using your flutter valve daily as you have been doing Continue using Mucinex daily with a full glass of water. Follow up Echo in 02/2019 to evaluate your Pulmonary Artery Pressure ( Ask Dr. Radford Pax) Continue to exercise as you have been doing. We will consider a sleep evaluation with an O&O at follow up. Let me know if you would like to do it sooner  Please contact office for sooner follow up if symptoms do not improve or worsen or seek emergency care Call at any time for an acute visit   Remember we like to be very proactive.  Follow up with Troy Willis or Troy Willis in 05/2019 to review CT scan and Echo.

## 2018-10-31 NOTE — Progress Notes (Signed)
History of Present Illness Troy Willis is a 73 y.o. male never smoker with NSIP, PAH  and bronchiectasis. He is followed by Dr. Lake Bells  Synopsis: Synopsis: Evaluated in 2017 by [facility] for an ill-defined interstitial lung disease and bronchiectasis which is felt to be related to a prior severe lung infection. He also has mild emphysema on CT chest and pulmonary nodules. He had a severe  Alpha 1 testing 02/2016 > M/MAugust 2017 CT chest showed findings suggestive of nonspecific and distal pneumonitis with cylindrical bronchiectasis, mucous plugging and mild paraseptal and centrilobular emphysema. Three-vessel coronary disease noted. Stable pulmonary nodules. He has known coronary artery disease and a history of a stent.  Upper endoscopy in 2013 showed evidence of esophagitis Last Echo 02/2018>> PAP 48 mm Hg   10/31/2018  Pt. Presents for follow up after having PFT's this morning. He states he is doing well. He has started riding his bike. He states he does have some shortness of breath with exercise, but he can tell this is getting better as he exercises more and is re-conditioning.He is also walking up to 3 miles a day. He is very compliant and engaged in his health care regiment. We reviewed his Pulmonary Function Test. DLCO is improved compared to his last PFT in 03/2017. He states he does have some white secretions. He feels this is his baseline. He is compliant with his Mucinex and Flutter valve. We discussed follow up Echo 02/2019 to re-evaluate his PAP. We also discussed sleep testing as possible etiology of his PAH, but he would prefer to wait on that at present. He denies fever, chest pain, orthopnea or hemoptysis. He feels he is currently doing well.   Test Results: CXR 09/26/2018 IMPRESSION: No active cardiopulmonary disease.  Echo 02/2018 Left ventricle: The cavity size was normal. There was mild concentric hypertrophy. Systolic function was normal.  The estimated ejection fraction was in the range of 60% to 65%. Wall motion was normal; there were no regional wall motion abnormalities. - Aortic valve: Trileaflet; mildly thickened, mildly calcified leaflets. - Mitral valve: There was mild regurgitation. - Left atrium: The atrium was moderately dilated. - Right atrium: The atrium was moderately dilated. - Pulmonary arteries: PA peak pressure: 48 mm Hg (S).  PFT November 2017 FVC 4.0 L 76% predicted, no airflow obstruction, total lung capacity 5.91 L 73% predicted, DLCO 22.12 Oct 2016 ratio normal, FEV1 3.04 L 79% predicted, FVC 4.25 L 81% protected, total lung capacity 6.30 L 78% productive, DLCO 17.90 45% predicted November 2018: Forced vital capacity 4.01 L 79% predicted, total lung capacity 6.30 L 80% predicted, DLCO 17.46 mL 46% predicted  6MW: 09/2016 660M,O2 saturation99% 03/2017 600M,O2 saturation100% 09/26/2018: 714 feet, O2 saturation 97%>>was wearing mask  HRCT 10/2018 There is very subtle peripheral interstitial reticular opacity without a clear apical to basal gradient, and with superimposed bandlike scarring or atelectasis of the bilateral lung bases. There is mild tubular bronchiectasis throughout without bronchiolectasis or honeycombing. Findings are not significantly changed in comparison to prior examinations dating back to 10/05/2015 and remain consistent with mild pulmonary fibrosis in an "indeterminate for UIP (early)" pattern by ATS pulmonary fibrosis criteria, differential considerations include both UIP and NSIP. There is superimposed scarring related to prior infection or aspiration.  There is a new 7 mm pulmonary nodule of the paramedian left upper lobe (series 3, image 68), nonspecific. Recommend follow-up for this nodule in 6-12 months. Additional stable benign pulmonary nodules. Some nodules are clustered and centrilobular,  for example in the anterior right upper lobe, findings  consistent with atypical infection, including atypical mycobacterium.  Cardiomegaly and coronary artery disease.  HRCT 12/2015 IMPRESSION: The appearance of the chest appears very similar to the prior examination, with lung findings that are again most suggestive of mild nonspecific interstitial pneumonia (NSIP). Mild diffuse bronchial wall thickening with mild centrilobular and paraseptal emphysema. Cardiomegaly with biatrial dilatation. Aortic atherosclerosis, in addition to left main and 3 vessel coronary artery disease. Please note that although the presence of coronary artery calcium documents the presence of coronary artery disease, the severity of this disease and any potential stenosis cannot be assessed on this non-gated CT examination. Assessment for potential risk factor modification, dietary therapy or pharmacologic therapy may be warranted, if clinically indicated. Multiple tiny pulmonary nodules are again noted, stable compared to the prior study, most compatible with areas of chronic mucoid impaction within terminal bronchioles. These all measure 6 mm or less in size. Repeat noncontrast chest CT could be considered in 12-18 months to ensure continued stability these findings.  CBC Latest Ref Rng & Units 10/18/2017 02/14/2017 04/01/2016  WBC 3.4 - 10.8 x10E3/uL 6.6 6.1 4.9  Hemoglobin 13.0 - 17.7 g/dL 11.9(L) 12.4(L) 12.5(L)  Hematocrit 37.5 - 51.0 % 37.4(L) 37.2(L) 37.8(L)  Platelets 150 - 450 x10E3/uL 233 204 198    BMP Latest Ref Rng & Units 10/18/2017 02/14/2017 10/07/2016  Glucose 65 - 99 mg/dL 88 98 98  BUN 8 - 27 mg/dL 17 20 18   Creatinine 0.76 - 1.27 mg/dL 0.92 0.85 0.89  BUN/Creat Ratio 10 - 24 18 24 20   Sodium 134 - 144 mmol/L 139 139 139  Potassium 3.5 - 5.2 mmol/L 4.5 5.1 4.7  Chloride 96 - 106 mmol/L 102 101 101  CO2 20 - 29 mmol/L 26 25 25   Calcium 8.6 - 10.2 mg/dL 9.2 9.3 9.1    BNP No results found for: BNP  ProBNP No results found for:  PROBNP  PFT    Component Value Date/Time   FEV1PRE 2.90 10/31/2018 0849   FEV1POST 3.05 10/31/2018 0849   FVCPRE 4.03 10/31/2018 0849   FVCPOST 4.15 10/31/2018 0849   TLC 6.40 10/31/2018 0849   DLCOUNC 17.98 10/31/2018 0849   PREFEV1FVCRT 72 10/31/2018 0849   PSTFEV1FVCRT 74 10/31/2018 0849    Ct Chest High Resolution  Result Date: 10/19/2018 CLINICAL DATA:  Evaluate ILD, productive cough EXAM: CT CHEST WITHOUT CONTRAST TECHNIQUE: Multidetector CT imaging of the chest was performed following the standard protocol without intravenous contrast. High resolution imaging of the lungs, as well as inspiratory and expiratory imaging, was performed. COMPARISON:  12/22/2016, 01/14/2016, 10/05/2015, 09/07/2015 FINDINGS: Cardiovascular: Three-vessel coronary artery calcifications and/or stents. Cardiomegaly with very enlarged atria. No pericardial effusion. Aortic atherosclerosis. Mediastinum/Nodes: No enlarged mediastinal, hilar, or axillary lymph nodes. Thyroid gland, trachea, and esophagus demonstrate no significant findings. Lungs/Pleura: There are multiple small, stable, benign pulmonary nodules, for example in the dependent left lung base (series 3, image 114). There is a new 7 mm pulmonary nodule of the paramedian left upper lobe (series 3, image 68). Some nodules are clustered and centrilobular, for example in the anterior right upper lobe. there is very subtle peripheral interstitial reticular opacity without a clear apical to basal gradient, and with superimposed bandlike scarring or atelectasis of the bilateral lung bases. There is mild tubular bronchiectasis throughout without bronchiolectasis or honeycombing. Minimal emphysema. No pleural effusion or pneumothorax. Upper Abdomen: No acute abnormality. Musculoskeletal: No chest wall mass or suspicious bone lesions  identified. IMPRESSION: 1. There is very subtle peripheral interstitial reticular opacity without a clear apical to basal gradient, and  with superimposed bandlike scarring or atelectasis of the bilateral lung bases. There is mild tubular bronchiectasis throughout without bronchiolectasis or honeycombing. Findings are not significantly changed in comparison to prior examinations dating back to 10/05/2015 and remain consistent with mild pulmonary fibrosis in an "indeterminate for UIP (early)" pattern by ATS pulmonary fibrosis criteria, differential considerations include both UIP and NSIP. There is superimposed scarring related to prior infection or aspiration. 2. There is a new 7 mm pulmonary nodule of the paramedian left upper lobe (series 3, image 68), nonspecific. Recommend follow-up for this nodule in 6-12 months. Additional stable benign pulmonary nodules. Some nodules are clustered and centrilobular, for example in the anterior right upper lobe, findings consistent with atypical infection, including atypical mycobacterium. 3.  Cardiomegaly and coronary artery disease. Electronically Signed   By: Eddie Candle M.D.   On: 10/19/2018 15:50     Past medical hx Past Medical History:  Diagnosis Date   Anemia    Arthritis    hands, lower back   CAD (coronary artery disease), native coronary artery    a. 08/2015 Cath/PCI: LM nl, LAD 3m, LCX small, nl, RCA 80p (4.0x16 Synergy DES),    Cataracts, bilateral    surgery   Depression    Diverticulosis    GERD (gastroesophageal reflux disease)    Hilar adenopathy    a. 08/2015 CT Chest: mild bilat hilar adenopathy and mild soft tissue prominence in inf right hilum, left hilum. Also areas of soft tissue and low-density material filling LLL bronchial airways->? mucous vs mass.   Hx of adenomatous colonic polyps 08/13/2015   Hyperlipidemia    MVP (mitral valve prolapse)    bileaflet with mild MR by echo 02/2017   Nonspecific elevation of levels of transaminase or lactic acid dehydrogenase (LDH)    PMH of ; ? 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 CHA2DS2VASc = 2 -->Xarelto started 08/2015.   Pneumonia, bacterial 05/18/2012   Pulmonary nodules    a. 08/2015 CT Chest: reticulonodular opacities in RUL - largest 77mm - likely 2/2 inflammatory process.     Social History   Tobacco Use   Smoking status: Never Smoker   Smokeless tobacco: Former Systems developer    Types: Chew   Tobacco comment: occasional cigar in past; not regular smoker  Substance Use Topics   Alcohol use: Yes    Alcohol/week: 1.0 standard drinks    Types: 1 Cans of beer per week    Comment: 4/week   Drug use: No    Mr.Radziewicz reports that he has never smoked. He has quit using smokeless tobacco.  His smokeless tobacco use included chew. He reports current alcohol use of about 1.0 standard drinks of alcohol per week. He reports that he does not use drugs.  Tobacco Cessation: Never smoker  Past surgical hx, Family hx, Social hx all reviewed.  Current Outpatient Medications on File Prior to Visit  Medication Sig   atorvastatin (LIPITOR) 80 MG tablet TAKE 1 TABLET BY MOUTH DAILY AT 6PM **PLEASE MAKE APPOINTMENT FOR JUNE**   Biotin 1000 MCG tablet Take 1,000 mcg by mouth daily.   Celecoxib (CELEBREX PO) Take by mouth as needed.   Cholecalciferol (VITAMIN D3) 2000 UNITS TABS Take 1 tablet by mouth daily.    Multiple Vitamin (MULTIVITAMIN) tablet Take 1 tablet by mouth  daily.   nitroGLYCERIN (NITROSTAT) 0.4 MG SL tablet Place 1 tablet (0.4 mg total) under the tongue every 5 (five) minutes x 3 doses as needed for chest pain.   ranitidine (ZANTAC) 150 MG tablet Take 150 mg by mouth as needed for heartburn (Take as directed).    Respiratory Therapy Supplies (FLUTTER) DEVI Use as directed   XARELTO 20 MG TABS tablet TAKE ONE TABLET BY MOUTH ONE TIME DAILY WITH SUPPER   No current facility-administered medications on file prior to visit.      Allergies  Allergen Reactions   Amiodarone Other (See Comments)    REACTION: elevated liver  enzymes   Metoprolol Other (See Comments)    Hypotension    Prevacid [Lansoprazole] Other (See Comments)    Cannot take with plavix    Review Of Systems:  Constitutional:   No  weight loss, night sweats,  Fevers, chills, fatigue, or  lassitude.  HEENT:   No headaches,  Difficulty swallowing,  Tooth/dental problems, or  Sore throat,                No sneezing, itching, ear ache, nasal congestion, post nasal drip,   CV:  No chest pain,  Orthopnea, PND, swelling in lower extremities, anasarca, dizziness, palpitations, syncope.   GI  No heartburn, indigestion, abdominal pain, nausea, vomiting, diarrhea, change in bowel habits, loss of appetite, bloody stools.   Resp: + shortness of breath with exertion only,  None  at rest.  +  excess baseline mucus, + productive cough,  No non-productive cough,  No coughing up of blood.  No change in color of mucus.  No wheezing.  No chest wall deformity  Skin: no rash or lesions.  GU: no dysuria, change in color of urine, no urgency or frequency.  No flank pain, no hematuria   MS:  No joint pain or swelling.  No decreased range of motion.  No back pain.  Psych:  No change in mood or affect. No depression or anxiety.  No memory loss.   Vital Signs BP 108/68 (BP Location: Left Arm, Cuff Size: Normal)    Pulse 61    Ht 6\' 2"  (1.88 m)    Wt 168 lb (76.2 kg)    SpO2 99%    BMI 21.57 kg/m    Physical Exam:  General- No distress,  A&Ox3, pleasant ENT: No sinus tenderness, TM clear, pale nasal mucosa, no oral exudate,no post nasal drip, no LAN Cardiac: S1, S2, regular rate and rhythm, no murmur Chest: No wheeze/ rales/ dullness; no accessory muscle use, no nasal flaring, no sternal retractions Abd.: Soft Non-tender, ND, BS +, Body mass index is 21.57 kg/m. Ext: No clubbing cyanosis, edema Neuro:  normal strength, MAE x 4, A&O x 3 Skin: No rashes, no lesions, warm and dry Psych: normal mood and behavior   Assessment/Plan  Pulmonary HTN  ([place]) Per Echo 02/2018 PAP 48 mm Hg ( Suspect WHO Type 3) ? Contributing factor to dyspnea PFT's with improved DLCO Plan Follow up Echo 02/2019 Consider O&O and sleep study as possible etiology Diuretic therapy not indicated at this point Follow up in 05/2019 to review repeat CT and Echo  Bronchiectasis without acute exacerbation ([place]) Continued improvement after treatment with Levaquin Stable interval Plan Continue Mucinex daily Continue Flutter valve daily Call for any change in secretions to allow [place] to be proactive Follow up in 05/2019 or sooner if needed   ILD (interstitial lung disease) ([place]) HRCT confirms minimal progression DLCO  improved per most recent PFT Improving dyspnea Plan: Continue exercising Follow up in 6 months with 6 minute walk  Consider Pulmonary Rehab    Solitary pulmonary nodule New 7 mm Pulmonary Nodule noted per HRCT 10/2018 ? Post inflammatory vs atypical infection Plan  Repeat CT 04/2019  Deconditioning post bronchiectasis flare Contributing to dyspnea Plan Continue your exercise regimen Declined Pulmonary Rehab at present Feels he can do this at home  [name], NP 10/31/2018  11:40 AM

## 2018-11-01 ENCOUNTER — Ambulatory Visit: Payer: PPO | Admitting: Acute Care

## 2018-11-01 NOTE — Progress Notes (Signed)
Reviewed, agree 

## 2018-12-12 ENCOUNTER — Other Ambulatory Visit: Payer: Self-pay | Admitting: Cardiology

## 2018-12-12 NOTE — Telephone Encounter (Signed)
Pt last saw Dr Radford Pax 10/18/17, pt is overdue for follow-up has appt scheduled for 02/05/19.  Last labs 10/18/17 Creat 0.92, pt is overdue for labwork.  Placed note on appt pt needs CBC and CMB at [place] on 02/05/19. Age 74, weight 76kg, CrCl 75.72, based on CrCl pt is on appropriate dosage of Xarelto 20mg  QD.  Will refill rx.

## 2018-12-13 ENCOUNTER — Other Ambulatory Visit: Payer: Self-pay | Admitting: Cardiology

## 2018-12-13 DIAGNOSIS — Z961 Presence of intraocular lens: Secondary | ICD-10-CM | POA: Diagnosis not present

## 2018-12-13 DIAGNOSIS — H2640 Unspecified secondary cataract: Secondary | ICD-10-CM | POA: Diagnosis not present

## 2018-12-13 DIAGNOSIS — H35033 Hypertensive retinopathy, bilateral: Secondary | ICD-10-CM | POA: Diagnosis not present

## 2018-12-13 DIAGNOSIS — H40013 Open angle with borderline findings, low risk, bilateral: Secondary | ICD-10-CM | POA: Diagnosis not present

## 2018-12-14 ENCOUNTER — Other Ambulatory Visit: Payer: Self-pay

## 2019-01-01 NOTE — Telephone Encounter (Signed)
[name] please advise on patient email.  Thanks!   Hello [name],    I am following your instructions and am doing well.    Stage III clinical trials are beginning for the [place] vaccine. Subject to your and Dr. Anastasia Pall advice, I would like to participate in that trial.   My logic: If it works, my risk for getting COVID is greatly reduced, if it doesn't work, I'm no worse off.  What do you and Dr. Lake Bells recommend? If "yes", can you help get me (and possibly my wife, [name]) into the trial.    Thank you!  Troy Willis  [phone]

## 2019-01-03 NOTE — Telephone Encounter (Signed)
[facility] is offering ? [place] is offering Moderna trial

## 2019-01-06 NOTE — Telephone Encounter (Signed)
I'm OK with him participating once he has reviewed informed consent

## 2019-01-07 NOTE — Telephone Encounter (Signed)
MR please advise if you have any info on how to enroll a patient in this study.  Thanks!

## 2019-02-04 DIAGNOSIS — D225 Melanocytic nevi of trunk: Secondary | ICD-10-CM | POA: Diagnosis not present

## 2019-02-04 DIAGNOSIS — D485 Neoplasm of uncertain behavior of skin: Secondary | ICD-10-CM | POA: Diagnosis not present

## 2019-02-04 DIAGNOSIS — L814 Other melanin hyperpigmentation: Secondary | ICD-10-CM | POA: Diagnosis not present

## 2019-02-04 DIAGNOSIS — L821 Other seborrheic keratosis: Secondary | ICD-10-CM | POA: Diagnosis not present

## 2019-02-04 DIAGNOSIS — L570 Actinic keratosis: Secondary | ICD-10-CM | POA: Diagnosis not present

## 2019-02-05 ENCOUNTER — Encounter: Payer: Self-pay | Admitting: Cardiology

## 2019-02-05 ENCOUNTER — Ambulatory Visit: Payer: PPO | Admitting: Cardiology

## 2019-02-05 ENCOUNTER — Other Ambulatory Visit: Payer: Self-pay

## 2019-02-05 ENCOUNTER — Ambulatory Visit (HOSPITAL_COMMUNITY): Payer: PPO | Attending: Cardiology

## 2019-02-05 VITALS — BP 110/58 | HR 50 | Ht 75.0 in | Wt 166.0 lb

## 2019-02-05 DIAGNOSIS — E7800 Pure hypercholesterolemia, unspecified: Secondary | ICD-10-CM | POA: Diagnosis not present

## 2019-02-05 DIAGNOSIS — I272 Pulmonary hypertension, unspecified: Secondary | ICD-10-CM | POA: Diagnosis not present

## 2019-02-05 DIAGNOSIS — I4821 Permanent atrial fibrillation: Secondary | ICD-10-CM | POA: Diagnosis not present

## 2019-02-05 DIAGNOSIS — I2510 Atherosclerotic heart disease of native coronary artery without angina pectoris: Secondary | ICD-10-CM | POA: Diagnosis not present

## 2019-02-05 NOTE — Patient Instructions (Signed)
Medication Instructions:  Your physician recommends that you continue on your current medications as directed. Please refer to the Current Medication list given to you today.  If you need a refill on your cardiac medications before your next appointment, please call your pharmacy.   Lab work: Your physician recommends that you return for Fasting lab work: CMET Fasting Lipid Panel CBC   If you have labs (blood work) drawn today and your tests are completely normal, you will receive your results only by: [name] MyChart Message (if you have MyChart) OR . A paper copy in the mail If you have any lab test that is abnormal or we need to change your treatment, we will call you to review the results.  Testing/Procedures: None Ordered  Follow-Up: At [facility], you and your health needs are our priority.  As part of our continuing mission to provide you with exceptional heart care, we have created designated Provider Care Teams.  These Care Teams include your primary Cardiologist (physician) and Advanced Practice Providers (APPs -  Physician Assistants and Nurse Practitioners) who all work together to provide you with the care you need, when you need it. You will need a follow up appointment in 1 years.  Please call our office 2 months in advance to schedule this appointment.  You may see Dr. Radford Pax or one of the following Advanced Practice Providers on your designated Care Team:   Acomita Lake, PA-C Melina Copa, PA-C . Ermalinda Barrios, PA-C  .
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
## 2019-02-06 ENCOUNTER — Telehealth: Payer: Self-pay | Admitting: Family Medicine

## 2019-02-06 ENCOUNTER — Telehealth: Payer: Self-pay

## 2019-02-06 DIAGNOSIS — Z125 Encounter for screening for malignant neoplasm of prostate: Secondary | ICD-10-CM

## 2019-02-06 DIAGNOSIS — I272 Pulmonary hypertension, unspecified: Secondary | ICD-10-CM

## 2019-02-06 DIAGNOSIS — I079 Rheumatic tricuspid valve disease, unspecified: Secondary | ICD-10-CM

## 2019-02-06 DIAGNOSIS — I080 Rheumatic disorders of both mitral and aortic valves: Secondary | ICD-10-CM

## 2019-02-06 NOTE — Telephone Encounter (Signed)
Pt called in stating that he is having BW done tomorrow he wanted to know if Dr. [name] would add a PSA to the BW he is having done. Pt can be reached at the home #

## 2019-02-06 NOTE — Telephone Encounter (Signed)
Called patient with echo results. Per Dr. Radford Pax, Echo showed normal LVF with enlarged atrial, moderate MR, severe TR and moderate PHTN. COmpared to prior echo the MR has progressed.  Please repeat echo in 6 months. Patient verbalized understanding. Will ordered echo and have scheduling call patient to make an appointment.

## 2019-02-06 NOTE — Telephone Encounter (Signed)
Pt will need to ask Dr Radford Pax to add the PSA since the orders didn't come from me.  Unfortunately, I am not able to add to these orders

## 2019-02-06 NOTE — Telephone Encounter (Signed)
Pt had asked Dr. Radford Pax and their office advised pt to ask Dr. [name] to order.

## 2019-02-06 NOTE — Telephone Encounter (Signed)
Please advise 

## 2019-02-06 NOTE — Telephone Encounter (Signed)
-----   Message from Sueanne Margarita, MD sent at 02/06/2019  8:03 AM EDT ----- Echo showed normal LVF with enlarged atrial, moderate MR, severe TR and moderate PHTN. COmpared to prior echo the MR has progressed.  Please repeat echo in 6 months

## 2019-02-06 NOTE — Telephone Encounter (Signed)
Ok to place future medicare PSA (dx prostate cancer screen)

## 2019-02-06 NOTE — Telephone Encounter (Signed)
Called and advised pt to let lb tech know to release lab. Stated an understanding labs ordered.

## 2019-02-07 ENCOUNTER — Other Ambulatory Visit: Payer: PPO | Admitting: *Deleted

## 2019-02-07 ENCOUNTER — Other Ambulatory Visit (INDEPENDENT_AMBULATORY_CARE_PROVIDER_SITE_OTHER): Payer: PPO

## 2019-02-07 ENCOUNTER — Other Ambulatory Visit: Payer: Self-pay

## 2019-02-07 DIAGNOSIS — I4821 Permanent atrial fibrillation: Secondary | ICD-10-CM | POA: Diagnosis not present

## 2019-02-07 DIAGNOSIS — I272 Pulmonary hypertension, unspecified: Secondary | ICD-10-CM | POA: Diagnosis not present

## 2019-02-07 DIAGNOSIS — Z125 Encounter for screening for malignant neoplasm of prostate: Secondary | ICD-10-CM

## 2019-02-07 DIAGNOSIS — E7800 Pure hypercholesterolemia, unspecified: Secondary | ICD-10-CM

## 2019-02-07 DIAGNOSIS — I2510 Atherosclerotic heart disease of native coronary artery without angina pectoris: Secondary | ICD-10-CM

## 2019-02-07 LAB — CBC
Hematocrit: 34.8 % — ABNORMAL LOW (ref 37.5–51.0)
Hemoglobin: 12.1 g/dL — ABNORMAL LOW (ref 13.0–17.7)
MCH: 35.3 pg — ABNORMAL HIGH (ref 26.6–33.0)
MCHC: 34.8 g/dL (ref 31.5–35.7)
MCV: 102 fL — ABNORMAL HIGH (ref 79–97)
Platelets: 149 10*3/uL — ABNORMAL LOW (ref 150–450)
RBC: 3.43 x10E6/uL — ABNORMAL LOW (ref 4.14–5.80)
RDW: 12 % (ref 11.6–15.4)
WBC: 5.3 10*3/uL (ref 3.4–10.8)

## 2019-02-07 LAB — COMPREHENSIVE METABOLIC PANEL
ALT: 36 IU/L (ref 0–44)
AST: 47 IU/L — ABNORMAL HIGH (ref 0–40)
Albumin/Globulin Ratio: 1.4 (ref 1.2–2.2)
Albumin: 4.3 g/dL (ref 3.7–4.7)
Alkaline Phosphatase: 66 IU/L (ref 39–117)
BUN/Creatinine Ratio: 20 (ref 10–24)
BUN: 16 mg/dL (ref 8–27)
Bilirubin Total: 0.4 mg/dL (ref 0.0–1.2)
CO2: 26 mmol/L (ref 20–29)
Calcium: 9.5 mg/dL (ref 8.6–10.2)
Chloride: 101 mmol/L (ref 96–106)
Creatinine, Ser: 0.82 mg/dL (ref 0.76–1.27)
GFR calc Af Amer: 101 mL/min/{1.73_m2} (ref >59)
GFR calc non Af Amer: 87 mL/min/{1.73_m2} (ref >59)
Globulin, Total: 3 g/dL (ref 1.5–4.5)
Glucose: 100 mg/dL — ABNORMAL HIGH (ref 65–99)
Potassium: 4.7 mmol/L (ref 3.5–5.2)
Sodium: 138 mmol/L (ref 134–144)
Total Protein: 7.3 g/dL (ref 6.0–8.5)

## 2019-02-07 LAB — LIPID PANEL
Chol/HDL Ratio: 1.7 ratio (ref 0.0–5.0)
Cholesterol, Total: 130 mg/dL (ref 100–199)
HDL: 75 mg/dL (ref >39)
LDL Chol Calc (NIH): 47 mg/dL (ref 0–99)
Triglycerides: 28 mg/dL (ref 0–149)
VLDL Cholesterol Cal: 8 mg/dL (ref 5–40)

## 2019-02-07 LAB — PSA, MEDICARE: PSA: 0.44 ng/ml (ref 0.10–4.00)

## 2019-03-10 DIAGNOSIS — R918 Other nonspecific abnormal finding of lung field: Secondary | ICD-10-CM

## 2019-03-11 NOTE — Telephone Encounter (Signed)
Received below [employer] email message.  Will route to [facility] pool to schedule.  Thanks!   "Good Morning Troy Willis, During our visit 10/31/18 you spoke of arranging  a repeat CTChest scan at the end of December 2020.  Is it time for [place] to schedule that scan or should I contact you later? [name], Troy Willis [phone] (cell)"

## 2019-03-22 ENCOUNTER — Other Ambulatory Visit: Payer: Self-pay | Admitting: Cardiology

## 2019-03-22 NOTE — Telephone Encounter (Signed)
Pt last saw Dr Radford Pax 02/05/19, last labs 02/07/19 Creat 0.82, age 73, weight 75.3kg, CrCl 84.18, based on CrCl pt is on appropriate dosage of Xarelto 20mg  QD.  Will refill rx.

## 2019-03-24 ENCOUNTER — Encounter: Payer: Self-pay | Admitting: Family Medicine

## 2019-03-25 NOTE — Telephone Encounter (Signed)
Received the following messages from the patient:   "03/24/2019 Troy Willis (1946-01-23) In the last couple of weeks I have noticed some undesirable (?) changes in my general health.  I am writing to bring them to your attention to see if I should take some action or not.   Please let me know how/if to proceed. 1.  Over last 2 weeks,  feet, ankles, and lower legs swollen.  (not normal.) 2.  Blood Pressure:  Over years, my usual blood pressure is 100-115/55-65.  Over the last couple of weeks readings, taken after rising, before breakfast, were:  03/08/19      126/75 03/17/19        132/72 03/20/19        140/84 03/21/19        134/72 3.  Appear to be coughing somewhat more than normal and having somewhat more production of brown sputum.   4.  Over the last couple of weeks I've been pretty active, but have felt down on energy compared to "normal". I await your response. Thank you, Troy Willis [phone] (cell)"  Also received a message today from patient that he contacted his cardiologist Dr. Radford Pax and she advised for the patient to follow up with his PCP. Patient has been scheduled for an appt with Dr. [name] tomorrow morning. Advised him to let us know what she recommended for him.

## 2019-03-26 ENCOUNTER — Encounter: Payer: Self-pay | Admitting: Family Medicine

## 2019-03-26 ENCOUNTER — Ambulatory Visit (INDEPENDENT_AMBULATORY_CARE_PROVIDER_SITE_OTHER): Payer: PPO | Admitting: Family Medicine

## 2019-03-26 ENCOUNTER — Other Ambulatory Visit: Payer: Self-pay

## 2019-03-26 VITALS — BP 121/65 | HR 75 | Temp 97.1°F | Resp 16 | Ht 75.0 in | Wt 163.1 lb

## 2019-03-26 DIAGNOSIS — R5383 Other fatigue: Secondary | ICD-10-CM | POA: Diagnosis not present

## 2019-03-26 DIAGNOSIS — R053 Chronic cough: Secondary | ICD-10-CM

## 2019-03-26 DIAGNOSIS — R030 Elevated blood-pressure reading, without diagnosis of hypertension: Secondary | ICD-10-CM | POA: Diagnosis not present

## 2019-03-26 DIAGNOSIS — R600 Localized edema: Secondary | ICD-10-CM

## 2019-03-26 DIAGNOSIS — R05 Cough: Secondary | ICD-10-CM | POA: Diagnosis not present

## 2019-03-26 LAB — CBC WITH DIFFERENTIAL/PLATELET
Basophils Absolute: 0.1 10*3/uL (ref 0.0–0.1)
Basophils Relative: 1.3 % (ref 0.0–3.0)
Eosinophils Absolute: 0.1 10*3/uL (ref 0.0–0.7)
Eosinophils Relative: 1.9 % (ref 0.0–5.0)
HCT: 33.3 % — ABNORMAL LOW (ref 39.0–52.0)
Hemoglobin: 11.2 g/dL — ABNORMAL LOW (ref 13.0–17.0)
Lymphocytes Relative: 21.7 % (ref 12.0–46.0)
Lymphs Abs: 1 10*3/uL (ref 0.7–4.0)
MCHC: 33.6 g/dL (ref 30.0–36.0)
MCV: 106.1 fl — ABNORMAL HIGH (ref 78.0–100.0)
Monocytes Absolute: 0.6 10*3/uL (ref 0.1–1.0)
Monocytes Relative: 12.7 % — ABNORMAL HIGH (ref 3.0–12.0)
Neutro Abs: 2.8 10*3/uL (ref 1.4–7.7)
Neutrophils Relative %: 62.4 % (ref 43.0–77.0)
Platelets: 192 10*3/uL (ref 150.0–400.0)
RBC: 3.14 Mil/uL — ABNORMAL LOW (ref 4.22–5.81)
RDW: 14 % (ref 11.5–15.5)
WBC: 4.5 10*3/uL (ref 4.0–10.5)

## 2019-03-26 LAB — BASIC METABOLIC PANEL
BUN: 17 mg/dL (ref 6–23)
CO2: 29 mEq/L (ref 19–32)
Calcium: 9 mg/dL (ref 8.4–10.5)
Chloride: 103 mEq/L (ref 96–112)
Creatinine, Ser: 0.71 mg/dL (ref 0.40–1.50)
GFR: 108.33 mL/min (ref >60.00)
Glucose, Bld: 83 mg/dL (ref 70–99)
Potassium: 4.7 mEq/L (ref 3.5–5.1)
Sodium: 136 mEq/L (ref 135–145)

## 2019-03-26 LAB — BRAIN NATRIURETIC PEPTIDE: Pro B Natriuretic peptide (BNP): 225 pg/mL — ABNORMAL HIGH (ref 0.0–100.0)

## 2019-03-26 LAB — HEPATIC FUNCTION PANEL
ALT: 27 U/L (ref 0–53)
AST: 37 U/L (ref 0–37)
Albumin: 3.9 g/dL (ref 3.5–5.2)
Alkaline Phosphatase: 66 U/L (ref 39–117)
Bilirubin, Direct: 0.2 mg/dL (ref 0.0–0.3)
Total Bilirubin: 0.6 mg/dL (ref 0.2–1.2)
Total Protein: 6.9 g/dL (ref 6.0–8.3)

## 2019-03-26 LAB — TSH: TSH: 2.9 u[IU]/mL (ref 0.35–4.50)

## 2019-03-26 MED ORDER — FUROSEMIDE 20 MG PO TABS: 20.0000 mg | ORAL_TABLET | Freq: Every day | ORAL | 3 refills | Status: DC

## 2019-03-26 NOTE — Assessment & Plan Note (Signed)
Ongoing issue.  Currently at baseline.  Denies increased SOB.  No abx needed at this time.  If sxs worsen he is to call Pulmonary.

## 2019-03-26 NOTE — Patient Instructions (Addendum)
Schedule your complete physical in 6 months We'll notify you of your lab results and make any changes if needed START the Furosemide 20mg  daily to help w/ swelling Limit your salt intake, increase your water intake, and elevate your legs when sitting Your BP may be higher than your usual but it is still excellent today! Call with any questions or concerns Hang in there!!

## 2019-03-26 NOTE — Progress Notes (Signed)
   Subjective:    Patient ID: Troy Willis, male    DOB: 08-18-1945, 73 y.o.   MRN: [id number]  HPI LE edema- pt has hx of Afib.  sxs started 'a couple of weeks ago'.  sxs improve overnight.  R>L.  Denies changes to diet.  Legs are 'uncomfortable'.  Denies hx of swelling.  No swelling of hands.  Denies CP, SOB above baseline, HAs.  Pt reports decreased water intake.  Cough- pt has hx of Bronchiectasis, Centrilobular Emphysema, ILD, and LPR.  Pt reports cough is 'stable'.  Fatigue- pt has hx of similar in the past.  [place]  sxs started 'a couple of weeks ago'.  Sleep has not changed- wakes frequently during the night.  Pt is doing much more physical activity than usual.  sxs have started since last lab draw- late October.  Elevated BP- is currently renovating his [place], increased physical activity.  BP today 121/65- but he is concerned it's not 110-115   Review of Systems For ROS see HPI     Objective:   Physical Exam Vitals signs reviewed.  Constitutional:      General: He is not in acute distress.    Appearance: Normal appearance. He is well-developed. He is not ill-appearing.  HENT:     Head: Normocephalic and atraumatic.  Eyes:     Conjunctiva/sclera: Conjunctivae normal.     Pupils: Pupils are equal, round, and reactive to light.  Neck:     Musculoskeletal: Normal range of motion and neck supple.     Thyroid: No thyromegaly.  Cardiovascular:     Heart sounds: Normal heart sounds. No murmur.     Comments: Rate controlled Afib Pulmonary:     Effort: Pulmonary effort is normal. No respiratory distress.     Breath sounds: Normal breath sounds.     Comments: + hoarseness Abdominal:     General: Bowel sounds are normal. There is no distension.     Palpations: Abdomen is soft.  Musculoskeletal:     Right lower leg: Edema (2+ LE edema) present.     Left lower leg: Edema (1+ LE edema) present.  Lymphadenopathy:     Cervical: No cervical  adenopathy.  Skin:    General: Skin is warm and dry.  Neurological:     Mental Status: He is alert and oriented to person, place, and time.     Cranial Nerves: No cranial nerve deficit.  Psychiatric:        Behavior: Behavior normal.           Assessment & Plan:  Edema- new.  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  Denies change in cough, SOB.  No CP.  Denies change in diet.  Does admit to decreased water intake.  Check BNP given his Afib although he does not appear to be significantly volume overloaded.  Reviewed recent labs- Cr and K WNL.  Start low dose Lasix.  Reviewed need for fluids, low Na diet, and elevation.  Will follow.  Elevated BP- had discussion w/ pt that his BP is completely normal.  Told him that with his more strenuous activity level, it is not unusual for BP to rise somewhat.  Told him that Lasix will also decrease BP slightly.  No further w/u needed.

## 2019-03-26 NOTE — Assessment & Plan Note (Signed)
Recurring problem.  Suspect this is due to his increased activity recently.  Check labs to determine if metabolic cause.  Will follow.

## 2019-04-07 ENCOUNTER — Encounter: Payer: Self-pay | Admitting: Family Medicine

## 2019-04-15 NOTE — Telephone Encounter (Signed)
[name] please see email and advise any recs. I let him know that he does not need to do covid test prior to CT, but I am unsure about his question regarding vaccine. Thanks

## 2019-04-22 ENCOUNTER — Ambulatory Visit (INDEPENDENT_AMBULATORY_CARE_PROVIDER_SITE_OTHER)
Admission: RE | Admit: 2019-04-22 | Discharge: 2019-04-22 | Disposition: A | Discharge status: Home / Self Care | Payer: PPO | Source: Ambulatory Visit | Attending: Acute Care | Admitting: Acute Care

## 2019-04-22 ENCOUNTER — Other Ambulatory Visit: Payer: Self-pay

## 2019-04-22 DIAGNOSIS — R911 Solitary pulmonary nodule: Secondary | ICD-10-CM | POA: Diagnosis not present

## 2019-04-22 DIAGNOSIS — R918 Other nonspecific abnormal finding of lung field: Secondary | ICD-10-CM

## 2019-04-23 ENCOUNTER — Other Ambulatory Visit: Payer: Self-pay

## 2019-04-23 DIAGNOSIS — R911 Solitary pulmonary nodule: Secondary | ICD-10-CM

## 2019-04-23 NOTE — Progress Notes (Unsigned)
t chest

## 2019-05-03 ENCOUNTER — Telehealth: Payer: Self-pay | Admitting: Family Medicine

## 2019-05-03 NOTE — Telephone Encounter (Signed)
It depends on whether the co-worker had direct contact with his father (the one currently sick) and if Mr Anez had direct/close/prolonged contact with the co-worker.  It also depends on whether masks were worn.  We are not typically testing contacts of contacts but if he develops any sore of respiratory or GI symptoms, I would schedule testing ASAP.

## 2019-05-03 NOTE — Telephone Encounter (Signed)
Called and spoke with pt. Co-worker did have contact with the father (caring for his sick mom), Patient and co-worker work in same area but not direct contact, patient was masked. Advised that he should be fine but if any symptoms develop he should not wait to go for testing. Pt stated an understanding.

## 2019-05-03 NOTE — Telephone Encounter (Signed)
Pt called in stating that a co-workers father tested positive for COVID, he states that he hasn't had direct contact with the co-worker's father. He has been around the co-workers. He is not having any symptoms he just wanted to be on top of it and wanted to know if he should be tested. Please advise

## 2019-05-03 NOTE — Telephone Encounter (Signed)
Please advise 

## 2019-05-14 ENCOUNTER — Encounter: Payer: Self-pay | Admitting: Family Medicine

## 2019-05-21 ENCOUNTER — Other Ambulatory Visit: Payer: Self-pay

## 2019-05-21 ENCOUNTER — Encounter: Payer: Self-pay | Admitting: Family Medicine

## 2019-05-21 ENCOUNTER — Ambulatory Visit (INDEPENDENT_AMBULATORY_CARE_PROVIDER_SITE_OTHER): Payer: PPO | Admitting: Family Medicine

## 2019-05-21 VITALS — BP 134/82 | HR 72 | Temp 97.9°F | Resp 16 | Ht 75.0 in | Wt 158.4 lb

## 2019-05-21 DIAGNOSIS — R718 Other abnormality of red blood cells: Secondary | ICD-10-CM | POA: Diagnosis not present

## 2019-05-21 DIAGNOSIS — R634 Abnormal weight loss: Secondary | ICD-10-CM

## 2019-05-21 DIAGNOSIS — R030 Elevated blood-pressure reading, without diagnosis of hypertension: Secondary | ICD-10-CM

## 2019-05-21 LAB — TSH: TSH: 2.96 u[IU]/mL (ref 0.35–4.50)

## 2019-05-21 LAB — CBC WITH DIFFERENTIAL/PLATELET
Basophils Absolute: 0.1 10*3/uL (ref 0.0–0.1)
Basophils Relative: 1 % (ref 0.0–3.0)
Eosinophils Absolute: 0.2 10*3/uL (ref 0.0–0.7)
Eosinophils Relative: 3.1 % (ref 0.0–5.0)
HCT: 36.4 % — ABNORMAL LOW (ref 39.0–52.0)
Hemoglobin: 12.2 g/dL — ABNORMAL LOW (ref 13.0–17.0)
Lymphocytes Relative: 21.8 % (ref 12.0–46.0)
Lymphs Abs: 1.4 10*3/uL (ref 0.7–4.0)
MCHC: 33.6 g/dL (ref 30.0–36.0)
MCV: 105.6 fl — ABNORMAL HIGH (ref 78.0–100.0)
Monocytes Absolute: 0.6 10*3/uL (ref 0.1–1.0)
Monocytes Relative: 9.1 % (ref 3.0–12.0)
Neutro Abs: 4.1 10*3/uL (ref 1.4–7.7)
Neutrophils Relative %: 65 % (ref 43.0–77.0)
Platelets: 221 10*3/uL (ref 150.0–400.0)
RBC: 3.45 Mil/uL — ABNORMAL LOW (ref 4.22–5.81)
RDW: 13.8 % (ref 11.5–15.5)
WBC: 6.2 10*3/uL (ref 4.0–10.5)

## 2019-05-21 LAB — SEDIMENTATION RATE: Sed Rate: 38 mm/hr — ABNORMAL HIGH (ref 0–20)

## 2019-05-21 LAB — BASIC METABOLIC PANEL
BUN: 20 mg/dL (ref 6–23)
CO2: 30 mEq/L (ref 19–32)
Calcium: 9.2 mg/dL (ref 8.4–10.5)
Chloride: 102 mEq/L (ref 96–112)
Creatinine, Ser: 0.69 mg/dL (ref 0.40–1.50)
GFR: 111.91 mL/min (ref >60.00)
Glucose, Bld: 92 mg/dL (ref 70–99)
Potassium: 4.5 mEq/L (ref 3.5–5.1)
Sodium: 136 mEq/L (ref 135–145)

## 2019-05-21 LAB — HEPATIC FUNCTION PANEL
ALT: 27 U/L (ref 0–53)
AST: 30 U/L (ref 0–37)
Albumin: 3.8 g/dL (ref 3.5–5.2)
Alkaline Phosphatase: 65 U/L (ref 39–117)
Bilirubin, Direct: 0.1 mg/dL (ref 0.0–0.3)
Total Bilirubin: 0.5 mg/dL (ref 0.2–1.2)
Total Protein: 7 g/dL (ref 6.0–8.3)

## 2019-05-21 LAB — B12 AND FOLATE PANEL
Folate: 20.3 ng/mL (ref >5.9)
Vitamin B-12: 532 pg/mL (ref 211–911)

## 2019-05-21 NOTE — Patient Instructions (Signed)
Follow up in 3 months to recheck weight We'll notify you of your lab results and make any changes if needed Consider scheduling your colonoscopy as this was due in the Spring.  This would complete the weight loss work up Schedule an appt with pulmonary to review the CT and discuss next steps Since you are working and burning more calories, make sure you are eating regularly and add snacks! Protein shakes can also be helpful! The increased systolic blood pressure is not concerning at this time and expected given age and medical conditions Call with any questions or concerns Stay Safe!  Stay Healthy!

## 2019-05-21 NOTE — Progress Notes (Signed)
   Subjective:    Patient ID: Troy Willis, male    DOB: 27-Apr-1946, 74 y.o.   MRN: [id number]  HPI Weight loss- pt was 168 in 2018, 167 in 2019, and 163 November 2020.  Now 158.  This is 10 lbs in 2 yrs.  Pt continues to work full days in [employer]- 'it really wears me out'.  Pt reports over the holidays 'i've been eating everything I can stuff in my mouth' and has regained 4 lbs.  Denies night sweats.  Pt had recent chest CT.  This fall pt stopped drinking- which he reports he was doing nightly.  Elevated BP- pt reports previous SBP avg was 108 but recently now 132.  Pt has known pulmonary HTN and COPD.   Review of Systems For ROS see HPI   This visit occurred during the SARS-CoV-2 public health emergency.  Safety protocols were in place, including screening questions prior to the visit, additional usage of staff PPE, and extensive cleaning of exam room while observing appropriate contact time as indicated for disinfecting solutions.       Objective:   Physical Exam Constitutional:      General: He is not in acute distress.    Appearance: Normal appearance. He is well-developed. He is not ill-appearing, toxic-appearing or diaphoretic.  HENT:     Head: Normocephalic and atraumatic.  Eyes:     Conjunctiva/sclera: Conjunctivae normal.     Pupils: Pupils are equal, round, and reactive to light.  Neck:     Thyroid: No thyromegaly.  Cardiovascular:     Rate and Rhythm: Normal rate and regular rhythm.     Heart sounds: Normal heart sounds. No murmur.  Pulmonary:     Effort: Pulmonary effort is normal. No respiratory distress.     Breath sounds: Normal breath sounds.  Abdominal:     General: Bowel sounds are normal. There is no distension.     Palpations: Abdomen is soft.  Musculoskeletal:     Cervical back: Normal range of motion and neck supple.  Lymphadenopathy:     Cervical: No cervical adenopathy.  Skin:    General: Skin is warm and dry.  Neurological:     Mental  Status: He is alert and oriented to person, place, and time.     Cranial Nerves: No cranial nerve deficit.  Psychiatric:        Behavior: Behavior normal.           Assessment & Plan:  Elevated MCV- pt reports that until recently, he was drinking alcohol nightly.  Check B12, thiamine, and folate.  Replete prn.  Elevated systolic BP- not truly hypertensive.  Discussed that it is normal for SBP to rise w/ age.  Also discussed that his PAH is likely playing a part.  This is something we will continue to monitor and address if readings continue to climb.  Pt expressed understanding and is in agreement w/ plan.

## 2019-05-26 LAB — VITAMIN B1: Vitamin B1 (Thiamine): 23 nmol/L (ref 8–30)

## 2019-05-29 ENCOUNTER — Other Ambulatory Visit: Payer: Self-pay

## 2019-05-29 ENCOUNTER — Encounter: Payer: Self-pay | Admitting: Acute Care

## 2019-05-29 ENCOUNTER — Ambulatory Visit (INDEPENDENT_AMBULATORY_CARE_PROVIDER_SITE_OTHER): Payer: PPO | Admitting: Acute Care

## 2019-05-29 VITALS — BP 116/60 | HR 85 | Temp 97.5°F | Ht 75.0 in | Wt 159.8 lb

## 2019-05-29 DIAGNOSIS — R29898 Other symptoms and signs involving the musculoskeletal system: Secondary | ICD-10-CM

## 2019-05-29 DIAGNOSIS — J849 Interstitial pulmonary disease, unspecified: Secondary | ICD-10-CM

## 2019-05-29 DIAGNOSIS — K766 Portal hypertension: Secondary | ICD-10-CM

## 2019-05-29 DIAGNOSIS — J479 Bronchiectasis, uncomplicated: Secondary | ICD-10-CM | POA: Diagnosis not present

## 2019-05-29 DIAGNOSIS — Z0000 Encounter for general adult medical examination without abnormal findings: Secondary | ICD-10-CM

## 2019-05-29 DIAGNOSIS — I2721 Secondary pulmonary arterial hypertension: Secondary | ICD-10-CM | POA: Diagnosis not present

## 2019-05-29 DIAGNOSIS — R911 Solitary pulmonary nodule: Secondary | ICD-10-CM | POA: Diagnosis not present

## 2019-05-29 NOTE — Progress Notes (Signed)
History of Present Illness Troy Willis is a 74 y.o. male never smoker with NSIP, PAHand bronchiectasis. He was  followed by Dr. Lake Bells. He needs to be reassigned to another primary pulmonologist.  Synopsis: Synopsis: Evaluated in 2017 by [facility] for an ill-defined interstitial lung disease and bronchiectasis which is felt to be related to a prior severe lung infection. He also has mild emphysema on CT chest and pulmonary nodules. He had a severe  Alpha 1 testing 02/2016 > M/MAugust 2017 CT chest showed findings suggestive of nonspecific and distal pneumonitis with cylindrical bronchiectasis, mucous plugging and mild paraseptal and centrilobular emphysema. Three-vessel coronary disease noted. Stable pulmonary nodules. He has known coronary artery disease and a history of a stent.  Upper endoscopy in 2013 showed evidence of esophagitis Last Echo 02/2019>> PAP 45 mm Hg, worsening TR>> Will repeat 08/2019  05/29/2019 6 month follow up  Pt. States he is doing well.No acute new issues. He is here to review diagnostics and imaging.  He continues to have  brown secretions which he states are  his basline, amounts vary from day to day. He endorses occasional blood tinged secretions. He is using his flutter valve and Mucinex daily.He is a very compliant patient. Echo results 01/2019 noted below. Pt will need repeat Echo 07/2019 to re-evaluate severe TR.We reviewed his 04/2019 CT chest. He does have some new Pulmonary nodules, and we will repeat CT Chest 08/2019 to re-evaluate.He continues to exercise . He is very vested in his healthcare.He states he did have some weight loss in November, but states he has gained that weight back since. We discussed meal supplements like Boost  if he notes any further weight loss. No worsening of his baseline dyspnea. He will need annual HRCT, PFT's  and 6 minute walk in April ( Based on 6 month follow up per 02/2019 scan)  Test Results:  Echo 01/2019  Left ventricular ejection fraction, by visual estimation, is 65 to 70%. The left ventricle has hyperdynamic function. Normal left ventricular size. There is no left ventricular hypertrophy.  Left ventricular diastolic function could not be evaluated pattern of LV diastolic filling.  Global right ventricle has normal systolic function.The right ventricular size is normal. No increase in right ventricular wall thickness.  Left atrial size was moderately dilated.  Right atrial size was severely dilated.  The mitral valve is normal in structure. Moderate mitral valve regurgitation. No evidence of mitral stenosis.  The tricuspid valve is normal in structure. Tricuspid valve regurgitation severe.  The aortic valve is normal in structure. Aortic valve regurgitation was not visualized by color flow Doppler. Structurally normal aortic valve, with no evidence of sclerosis or stenosis.  The pulmonic valve was not assessed. Pulmonic valve regurgitation is not visualized by color flow Doppler. Moderately elevated pulmonary artery systolic pressure. The tricuspid regurgitant velocity is 2.75 m/s, and with an assumed right atrial pressure of 15 mmHg, the estimated right ventricular systolic pressure is moderately elevated at 45.2 mmHg. The inferior vena cava is normal in size with greater than 50% respiratory variability, suggesting right atrial pressure of 3 mmHg.  In comparison to the previous echocardiogram(s): Prior examinations were reviewed in a side by side comparison of images. When compared to the prior study from 03/03/2017 RVSP is unchanged, now 45 mmHg, previously 47 mmHg, consistent with moderate pulmonary hypertension.  Echo showed normal LVF with enlarged atrial, moderate MR, severe TR and moderate PHTN. Compared to prior echo the MR has progressed.  Repeat  echo in 6 months per Cards to re-evaluate.   6MW: 09/2016 660M,O2 saturation99% 03/2017 600M,O2 saturation100% 09/26/2018: 714 feet, O2  saturation 97%>>was wearing mask  HRCT 04/2019 IMPRESSION: The appearance of the lungs remains compatible with interstitial lung disease, with stable findings compared to prior studies, again categorized as indeterminate for usual interstitial pneumonia (UIP) per current ATS guidelines. 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 While most of the previously noted pulmonary nodules are stable, there are some new nodules, as detailed above. The largest of these has a mean diameter of 7 mm in the lateral segment of the right middle lobe. Non-contrast chest CT at 6 months is recommended. If the nodules are stable at time of repeat CT, then future CT at 18-24 months (from today's scan) is considered optional for low-risk patients, but is recommended for high-risk patients.  Aortic atherosclerosis, in addition to left main and 3 vessel coronary artery disease. Assessment for potential risk factor modification, dietary therapy or pharmacologic therapy may be warranted, if clinically indicated. Mild cardiomegaly with biatrial dilatation.  Aortic Atherosclerosis (ICD10-I70.0).  02/2019 CT Chest  The appearance of the lungs remains compatible with interstitial lung disease, with stable findings compared to prior studies, again categorized as indeterminate for usual interstitial pneumonia (UIP) per current ATS guidelines. 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 While most of the previously noted pulmonary nodules are stable, there are some new nodules, as detailed above. The largest of these has a mean diameter of 7 mm in the lateral segment of the right middle lobe. Non-contrast chest CT at 6 months is recommended. If the nodules are stable at time of repeat CT, then future CT at 18-24 months (from today's scan) is considered optional for low-risk patients, but is recommended for high-risk patients. This  recommendation follows the consensus statement: Guidelines for Management of Incidental Pulmonary Nodules Detected on CT Images: From the Fleischner Society 2017; Radiology 2017; 284:228-243. Aortic atherosclerosis, in addition to left main and 3 vessel coronary artery disease. Assessment for potential risk factor modification, dietary therapy or pharmacologic therapy may be warranted, if clinically indicated. Mild cardiomegaly with biatrial dilatation.   HRCT 10/2018 There is very subtle peripheral interstitial reticular opacity without a clear apical to basal gradient, and with superimposed bandlike scarring or atelectasis of the bilateral lung bases. There is mild tubular bronchiectasis throughout without bronchiolectasis or honeycombing. Findings are not significantly changed in comparison to prior examinations dating back to 10/05/2015 and remain consistent with mild pulmonary fibrosis in an "indeterminate for UIP (early)" pattern by ATS pulmonary fibrosis criteria, differential considerations include both UIP and NSIP. There is superimposed scarring related to prior infection or aspiration.  There is a new 7 mm pulmonary nodule of the paramedian left upper lobe (series 3, image 68), nonspecific. Recommend follow-up for this nodule in 6-12 months. Additional stable benign pulmonary nodules. Some nodules are clustered and centrilobular, for example in the anterior right upper lobe, findings consistent with atypical infection, including atypical mycobacterium.  Cardiomegaly and coronary artery disease.  HRCT 12/2015 IMPRESSION: The appearance of the chest appears very similar to the prior examination, with lung findings that are again most suggestive of mild nonspecific interstitial pneumonia (NSIP). Mild diffuse bronchial wall thickening with mild centrilobular and paraseptal emphysema. Cardiomegaly with biatrial dilatation. Aortic atherosclerosis, in addition to left  main and 3 vessel coronary artery disease. Please note that although the presence of coronary artery calcium documents the presence of coronary artery disease,  the severity of this disease and any potential stenosis cannot be assessed on this non-gated CT examination. Assessment for potential risk factor modification, dietary therapy or pharmacologic therapy may be warranted, if clinically indicated. Multiple tiny pulmonary nodules are again noted, stable compared to the prior study, most compatible with areas of chronic mucoid impaction within terminal bronchioles. These all measure 6 mm or less in size. Repeat noncontrast chest CT could be considered in 12-18 months to ensure continued stability these findings.   CBC Latest Ref Rng & Units 05/21/2019 03/26/2019 02/07/2019  WBC 4.0 - 10.5 K/uL 6.2 4.5 5.3  Hemoglobin 13.0 - 17.0 g/dL 12.2(L) 11.2(L) 12.1(L)  Hematocrit 39.0 - 52.0 % 36.4(L) 33.3(L) 34.8(L)  Platelets 150.0 - 400.0 K/uL 221.0 192.0 149(L)    BMP Latest Ref Rng & Units 05/21/2019 03/26/2019 02/07/2019  Glucose 70 - 99 mg/dL 92 83 100(H)  BUN 6 - 23 mg/dL 20 17 16   Creatinine 0.40 - 1.50 mg/dL 0.69 0.71 0.82  BUN/Creat Ratio 10 - 24 - - 20  Sodium 135 - 145 mEq/L 136 136 138  Potassium 3.5 - 5.1 mEq/L 4.5 4.7 4.7  Chloride 96 - 112 mEq/L 102 103 101  CO2 19 - 32 mEq/L 30 29 26   Calcium 8.4 - 10.5 mg/dL 9.2 9.0 9.5    BNP No results found for: BNP  ProBNP    Component Value Date/Time   PROBNP 225.0 (H) 03/26/2019 0909    PFT    Component Value Date/Time   FEV1PRE 2.90 10/31/2018 0849   FEV1POST 3.05 10/31/2018 0849   FVCPRE 4.03 10/31/2018 0849   FVCPOST 4.15 10/31/2018 0849   TLC 6.40 10/31/2018 0849   DLCOUNC 17.98 10/31/2018 0849   PREFEV1FVCRT 72 10/31/2018 0849   PSTFEV1FVCRT 74 10/31/2018 0849    No results found.   Past medical hx Past Medical History:  Diagnosis Date  . Anemia   . Arthritis    hands, lower back  . CAD (coronary artery  disease), native coronary artery    a. 08/2015 Cath/PCI: LM nl, LAD 79m, LCX small, nl, RCA 80p (4.0x16 Synergy DES),   . Cataracts, bilateral    surgery  . Depression   . Diverticulosis   . GERD (gastroesophageal reflux disease)   . Hilar adenopathy    a. 08/2015 CT Chest: mild bilat hilar adenopathy and mild soft tissue prominence in inf right hilum, left hilum. Also areas of soft tissue and low-density material filling LLL bronchial airways->? mucous vs mass.  [name] Hx of adenomatous colonic polyps 08/13/2015  . Hyperlipidemia   . MVP (mitral valve prolapse)    bileaflet with mild MR by echo 02/2017  . Nonspecific elevation of levels of transaminase or lactic acid dehydrogenase (LDH)    PMH of ; ? due to Amiodarone   . 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 Permanent atrial fibrillation (HCC)    a. CHA2DS2VASc = 2 -->Xarelto started 08/2015.  [name] Pneumonia, bacterial 05/18/2012  . Pulmonary nodules    a. 08/2015 CT Chest: reticulonodular opacities in RUL - largest 28mm - likely 2/2 inflammatory process.     Social History   Tobacco Use  . Smoking status: Never Smoker  . Smokeless tobacco: Former Systems developer    Types: Chew  . Tobacco comment: occasional cigar in past; not regular smoker  Substance Use Topics  . Alcohol use: Yes    Alcohol/week: 1.0 standard drinks    Types: 1 Cans of beer per week    Comment: 4/week  .  Drug use: No    Mr.Maniaci reports that he has never smoked. He has quit using smokeless tobacco.  His smokeless tobacco use included chew. He reports current alcohol use of about 1.0 standard drinks of alcohol per week. He reports that he does not use drugs.  Tobacco Cessation: Never Smoker  Past surgical hx, Family hx, Social hx all reviewed.  Current Outpatient Medications on File Prior to Visit  Medication Sig  . acetaminophen (TYLENOL) 500 MG tablet Take 1,000 mg by mouth daily.  [name] atorvastatin (LIPITOR) 80 MG tablet Take 1 tablet (80 mg total) by mouth daily at 6 PM.   . Biotin 1000 MCG tablet Take 1,000 mcg by mouth daily.  . Cholecalciferol (VITAMIN D3) 2000 UNITS TABS Take 1 tablet by mouth daily.   . famotidine (PEPCID) 40 MG tablet Take 40 mg by mouth daily as needed for heartburn or indigestion.  . furosemide (LASIX) 20 MG tablet Take 1 tablet (20 mg total) by mouth daily.  . Multiple Vitamin (MULTIVITAMIN) tablet Take 1 tablet by mouth daily.  . nitroGLYCERIN (NITROSTAT) 0.4 MG SL tablet Place 1 tablet (0.4 mg total) under the tongue every 5 (five) minutes x 3 doses as needed for chest pain.  [name] Respiratory Therapy Supplies (FLUTTER) DEVI Use as directed  . rivaroxaban (XARELTO) 20 MG TABS tablet Take 1 tablet (20 mg total) by mouth daily with supper.   No current facility-administered medications on file prior to visit.     Allergies  Allergen Reactions  . Amiodarone Other (See Comments)    REACTION: elevated liver enzymes  . Metoprolol Other (See Comments)    Hypotension   . Prevacid [Lansoprazole] Other (See Comments)    Cannot take with plavix    Review Of Systems:  Constitutional:   No  weight loss, night sweats,  Fevers, chills, fatigue, or  lassitude.  HEENT:   No headaches,  Difficulty swallowing,  Tooth/dental problems, or  Sore throat,                No sneezing, itching, ear ache, nasal congestion, post nasal drip,   CV:  No chest pain,  Orthopnea, PND, swelling in lower extremities, anasarca, dizziness, palpitations, syncope.   GI  No heartburn, indigestion, abdominal pain, nausea, vomiting, diarrhea, change in bowel habits, loss of appetite, bloody stools.   Resp: Baseline  shortness of breath with exertion less  at rest.  No excess mucus, no productive cough,  No non-productive cough,  No coughing up of blood.  No change in color of mucus.  No wheezing.  No chest wall deformity  Skin: no rash or lesions.  GU: no dysuria, change in color of urine, no urgency or frequency.  No flank pain, no hematuria   MS:  No joint pain  or swelling.  No decreased range of motion.  No back pain.  Psych:  No change in mood or affect. No depression or anxiety.  No memory loss.   Vital Signs BP 116/60 (BP Location: Left Arm, Cuff Size: Normal)   Pulse 85   Temp (!) 97.5 F (36.4 C) (Oral)   Ht 6\' 3"  (1.905 m)   Wt 159 lb 12.8 oz (72.5 kg)   SpO2 100%   BMI 19.97 kg/m    Physical Exam:  General- No distress,  A&Ox3, pleasant ENT: No sinus tenderness, TM clear, pale nasal mucosa, no oral exudate,no post nasal drip, no LAN Cardiac: S1, S2, regular rate and rhythm, ? 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 Follow up in 05/2019 to review repeat CT and Echo Follow up wit Dr. Valeta Harms in 6 months  Bronchiectasis without acute exacerbation ([place]) Stable interval Plan Continue Mucinex daily Continue Flutter valve daily Call for any change in secretions to allow [place] to be proactive Follow up with Dr. Valeta Harms in 6 months  or sooner if needed   ILD (interstitial lung disease) ([place]) HRCT confirms minimal progression DLCO improved per 10/2018 PFT Improving dyspnea Plan: Continue exercising Follow up in 6 months with 6 minute walk  Consider Pulmonary Rehab Repeat  HRCT 10/2019 Pt with good BD response in mid flows per PFT's.>> Consider starting rescue BD ( Xopenex) for prn use    Solitary pulmonary nodule New 7 mm Pulmonary Nodule noted per HRCT 02/2019 ? Post inflammatory vs atypical infection Plan  Repeat CT 10/2019  Deconditioning post bronchiectasis flare Contributing to dyspnea Motivated to be active Plan Continue your exercise regimen Declined Pulmonary Rehab at present Feels he can do this at home  Healthcare Maintenence Please get COVID vaccine as soon as you are able Flu vaccine fall 2020  40 minutes of care provided  [name], NP 05/29/2019  2:20 PM

## 2019-05-29 NOTE — Patient Instructions (Addendum)
It is good to see you today. Continue your Mucinex daily with a full glass of water. Continue using your flutter valve daily We will schedule a CT Chest without contrast 10/2019. Follow up with Dr. Radford Pax re: additional echo and follow up of CT chest results ( calcifications) Please get your COVID vaccine as soon as you qualify We will establish you with Dr. Valeta Harms as your primary pulmonary MD.  Follow up with Dr. Valeta Harms in 4 months after CT Chest. Please contact office for sooner follow up if symptoms do not improve or worsen or seek emergency care

## 2019-05-31 ENCOUNTER — Telehealth: Payer: Self-pay | Admitting: Family Medicine

## 2019-05-31 ENCOUNTER — Encounter: Payer: Self-pay | Admitting: Family Medicine

## 2019-05-31 ENCOUNTER — Encounter: Payer: Self-pay | Admitting: Acute Care

## 2019-05-31 NOTE — Telephone Encounter (Signed)
Ok for letter

## 2019-05-31 NOTE — Telephone Encounter (Signed)
Pt has scheduled his 1st COVID vaccine for 06/11/2019 in [place]. Because he is on Xarelto, the vaccine site requires a letter approving him to receive the vaccine. Pt [phone]

## 2019-05-31 NOTE — Telephone Encounter (Signed)
Letter written and pt advised it should be able to be printed from mychart if not I will print and place at front desk.

## 2019-05-31 NOTE — Telephone Encounter (Signed)
Ok to provide pt w/ letter for COVID vaccine.  To Whom It May Concern  Mr Troy Willis is cleared to receive the COVID19 vaccine.

## 2019-06-16 NOTE — Assessment & Plan Note (Signed)
Ongoing issue for pt.  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  He also has multiple medical conditions- including emphysema- which is a wasting disease.  Will again check labs to assess for possible metabolic causes.  Encouraged healthy eating, more frequent intake, and continued monitoring of weight.  Pt expressed understanding and is in agreement w/ plan.

## 2019-06-21 ENCOUNTER — Telehealth: Payer: Self-pay

## 2019-06-21 ENCOUNTER — Ambulatory Visit (INDEPENDENT_AMBULATORY_CARE_PROVIDER_SITE_OTHER): Payer: PPO | Admitting: Internal Medicine

## 2019-06-21 ENCOUNTER — Encounter: Payer: Self-pay | Admitting: Internal Medicine

## 2019-06-21 VITALS — BP 106/64 | HR 52 | Temp 98.4°F | Ht 75.0 in | Wt 163.0 lb

## 2019-06-21 DIAGNOSIS — R634 Abnormal weight loss: Secondary | ICD-10-CM | POA: Diagnosis not present

## 2019-06-21 DIAGNOSIS — Z7901 Long term (current) use of anticoagulants: Secondary | ICD-10-CM | POA: Diagnosis not present

## 2019-06-21 DIAGNOSIS — Z86010 Personal history of colonic polyps: Secondary | ICD-10-CM | POA: Diagnosis not present

## 2019-06-21 DIAGNOSIS — I4821 Permanent atrial fibrillation: Secondary | ICD-10-CM | POA: Diagnosis not present

## 2019-06-21 NOTE — Telephone Encounter (Signed)
Fransico Him, MD 02/05/2019  Attempted to call pt, left voicemail.  Routed clearance request to Pharmacy to address Xarelto.  Rosaria Ferries, PA-C 06/21/2019 4:20 PM
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
## 2019-06-21 NOTE — Telephone Encounter (Signed)
Patient with diagnosis of afib on Xarelto for anticoagulation.    Procedure: colonoscopy Date of procedure: 07/23/2019  CHADS2-VASc score of  2 ( AGE, CAD)  CrCl 82 ml/min  Per office protocol, patient can hold Xarelto for 2 days prior to procedure.

## 2019-06-21 NOTE — Patient Instructions (Signed)
Normal BMI (Body Mass Index- based on height and weight) is between 23 and 30. Your BMI today is Body mass index is 20.37 kg/m. [name] Please consider follow up  regarding your BMI with your Primary Care Provider.   You have been scheduled for a colonoscopy. Please follow written instructions given to you at your visit today.  Please pick up your prep supplies at the pharmacy within the next 1-3 days. If you use inhalers (even only as needed), please bring them with you on the day of your procedure.   I appreciate the opportunity to care for you. Silvano Rusk, MD, [facility]

## 2019-06-21 NOTE — Telephone Encounter (Signed)
[facility] Pre-operative Risk Assessment     Request for surgical clearance:     Endoscopy Procedure  What type of surgery is being performed?     colonoscopy  When is this surgery scheduled?     07/23/2019  What type of clearance is required ?   Pharmacy  Are there any medications that need to be held prior to surgery and how long?  Xarelto, 2 days  Practice name and name of physician performing surgery?      [facility]  What is your office phone and fax number?      Phone- [phone]  [phone]  Anesthesia type (None, local, MAC, general) ?       MAC

## 2019-06-24 ENCOUNTER — Telehealth: Payer: Self-pay

## 2019-06-24 NOTE — Telephone Encounter (Addendum)
   Primary Cardiologist: Fransico Him, MD  Chart reviewed as part of pre-operative protocol coverage. Patient was last seen by Dr. Radford Pax on 02/05/2019 at which time he was doing well from a cardiac standpoint. I called patient today, and he reports no changes since last visit. He has chronic shortness of breath due to bronchiectasis but states this is stable. No chest pain, palpitations, syncope, or acute CHF symptoms. Able to complete >4.0 METS without any angina (he went hiking for 3 hours this past weekend with no problems).  Given past medical history and time since last visit, based on ACC/AHA guidelines, Leeandrew Kindschi would be at acceptable risk for the planned procedure without further cardiovascular testing.   Per Pharmacy and office protocol, OK to hold Xarelto for 2 days prior to colonoscopy. Should be restarted as soon as able following procedure.   I will route this recommendation to the requesting party via Epic fax function and remove from pre-op pool.  Please call with questions.  Darreld Mclean, PA-C 06/24/2019, 10:15 AM

## 2019-06-24 NOTE — Telephone Encounter (Signed)
Dr Tressia Miners Turner's office approved patient to hold xarelto for 2 days prior to colonoscopy on 07/23/2019. Patient informed and he verbalized understanding.

## 2019-06-27 ENCOUNTER — Ambulatory Visit: Payer: PPO

## 2019-07-10 ENCOUNTER — Encounter: Payer: Self-pay | Admitting: Internal Medicine

## 2019-07-23 ENCOUNTER — Other Ambulatory Visit: Payer: Self-pay

## 2019-07-23 ENCOUNTER — Ambulatory Visit (AMBULATORY_SURGERY_CENTER): Payer: PPO | Admitting: Internal Medicine

## 2019-07-23 ENCOUNTER — Encounter: Payer: Self-pay | Admitting: Internal Medicine

## 2019-07-23 VITALS — BP 128/68 | HR 71 | Temp 96.9°F | Resp 13 | Ht 75.0 in | Wt 163.0 lb

## 2019-07-23 DIAGNOSIS — I10 Essential (primary) hypertension: Secondary | ICD-10-CM | POA: Diagnosis not present

## 2019-07-23 DIAGNOSIS — K219 Gastro-esophageal reflux disease without esophagitis: Secondary | ICD-10-CM | POA: Diagnosis not present

## 2019-07-23 DIAGNOSIS — D123 Benign neoplasm of transverse colon: Secondary | ICD-10-CM | POA: Diagnosis not present

## 2019-07-23 DIAGNOSIS — I2510 Atherosclerotic heart disease of native coronary artery without angina pectoris: Secondary | ICD-10-CM | POA: Diagnosis not present

## 2019-07-23 DIAGNOSIS — Z86010 Personal history of colonic polyps: Secondary | ICD-10-CM

## 2019-07-23 DIAGNOSIS — I4891 Unspecified atrial fibrillation: Secondary | ICD-10-CM | POA: Diagnosis not present

## 2019-07-23 MED ORDER — SODIUM CHLORIDE 0.9 % IV SOLN: 500.0000 mL | Freq: Once | INTRAVENOUS | Status: DC

## 2019-07-23 NOTE — Progress Notes (Signed)
Temp by LC vitals by DT   Pt's states no medical or surgical changes since previsit or office visit.  

## 2019-07-23 NOTE — Progress Notes (Signed)
Called to room to assist during endoscopic procedure.  Patient ID and intended procedure confirmed with present staff. Received instructions for my participation in the procedure from the performing physician.  

## 2019-07-23 NOTE — Patient Instructions (Addendum)
I found and removed one tiny polyp.  I doubt I will be recommending another routine colonoscopy - will decide after I review pathology.  Restart Xarelto tomorrow.  I appreciate the opportunity to care for you. Gatha Mayer, MD, [facility]  Please read handouts provided. Continue present medications.   YOU HAD AN ENDOSCOPIC PROCEDURE TODAY AT [facility]:   Refer to the procedure report that was given to you for any specific questions about what was found during the examination.  If the procedure report does not answer your questions, please call your gastroenterologist to clarify.  If you requested that your care partner not be given the details of your procedure findings, then the procedure report has been included in a sealed envelope for you to review at your convenience later.  YOU SHOULD EXPECT: Some feelings of bloating in the abdomen. Passage of more gas than usual.  Walking can help get rid of the air that was put into your GI tract during the procedure and reduce the bloating. If you had a lower endoscopy (such as a colonoscopy or flexible sigmoidoscopy) you may notice spotting of blood in your stool or on the toilet paper. If you underwent a bowel prep for your procedure, you may not have a normal bowel movement for a few days.  Please Note:  You might notice some irritation and congestion in your nose or some drainage.  This is from the oxygen used during your procedure.  There is no need for concern and it should clear up in a day or so.  SYMPTOMS TO REPORT IMMEDIATELY:   Following lower endoscopy (colonoscopy or flexible sigmoidoscopy):  Excessive amounts of blood in the stool  Significant tenderness or worsening of abdominal pains  Swelling of the abdomen that is new, acute  Fever of 100F or higher   For urgent or emergent issues, a gastroenterologist can be reached at any hour by calling [phone]. Do not use MyChart messaging for urgent concerns.     DIET:  We do recommend a small meal at first, but then you may proceed to your regular diet.  Drink plenty of fluids but you should avoid alcoholic beverages for 24 hours.  ACTIVITY:  You should plan to take it easy for the rest of today and you should NOT DRIVE or use heavy machinery until tomorrow (because of the sedation medicines used during the test).    FOLLOW UP: Our staff will call the number listed on your records 48-72 hours following your procedure to check on you and address any questions or concerns that you may have regarding the information given to you following your procedure. If we do not reach you, we will leave a message.  We will attempt to reach you two times.  During this call, we will ask if you have developed any symptoms of COVID 19. If you develop any symptoms (ie: fever, flu-like symptoms, shortness of breath, cough etc.) before then, please call [phone].  If you test positive for Covid 19 in the 2 weeks post procedure, please call and report this information to [place].    If any biopsies were taken you will be contacted by phone or by letter within the next 1-3 weeks.  Please call us at [phone] if you have not heard about the biopsies in 3 weeks.    SIGNATURES/CONFIDENTIALITY: You and/or your care partner have signed paperwork which will be entered into your electronic medical record.  These signatures attest to  the fact that that the information above on your After Visit Summary has been reviewed and is understood.  Full responsibility of the confidentiality of this discharge information lies with you and/or your care-partner.

## 2019-07-23 NOTE — Progress Notes (Signed)
Report to PACU, RN, vss, BBS= Clear.  

## 2019-07-23 NOTE — Op Note (Signed)
[place] Patient Name: Troy Willis Procedure Date: 07/23/2019 10:40 AM MRN: [id number] Endoscopist: Gatha Mayer , MD Age: 74 Referring MD:  Date of Birth: 25-Apr-1946 Gender: Male Account #: [id number] Procedure:                Colonoscopy Indications:              Surveillance: Personal history of adenomatous                            polyps on last colonoscopy > 3 years ago Medicines:                Propofol per Anesthesia, Monitored Anesthesia Care Procedure:                Pre-Anesthesia Assessment:                           - Prior to the procedure, a History and Physical                            was performed, and patient medications and                            allergies were reviewed. The patient's tolerance of                            previous anesthesia was also reviewed. The risks                            and benefits of the procedure and the sedation                            options and risks were discussed with the patient.                            All questions were answered, and informed consent                            was obtained. Prior Anticoagulants: The patient                            last took Xarelto (rivaroxaban) 2 days prior to the                            procedure. ASA Grade Assessment: III - A patient                            with severe systemic disease. After reviewing the                            risks and benefits, the patient was deemed in                            satisfactory condition to undergo the procedure.  After obtaining informed consent, the colonoscope                            was passed under direct vision. Throughout the                            procedure, the patient's blood pressure, pulse, and                            oxygen saturations were monitored continuously. The                            Colonoscope was introduced through the anus and        advanced to the the cecum, identified by                            appendiceal orifice and ileocecal valve. The                            colonoscopy was performed without difficulty. The                            patient tolerated the procedure well. The quality                            of the bowel preparation was good. The bowel                            preparation used was Miralax via split dose                            instruction. The ileocecal valve, appendiceal                            orifice, and rectum were photographed. Scope In: 10:48:31 AM Scope Out: 11:01:02 AM Scope Withdrawal Time: 0 hours 9 minutes 21 seconds  Total Procedure Duration: 0 hours 12 minutes 31 seconds  Findings:                 The perianal and digital rectal examinations were                            normal. Pertinent negatives include normal prostate                            (size, shape, and consistency).                           A diminutive polyp was found in the transverse                            colon. The polyp was sessile. The polyp was removed  with a cold snare. Resection and retrieval were                            complete. Verification of patient identification                            for the specimen was done. Estimated blood loss was                            minimal.                           Small-mouthed diverticula were found in the sigmoid                            colon.                           The exam was otherwise without abnormality on                            direct and retroflexion views. Complications:            No immediate complications. Estimated Blood Loss:     Estimated blood loss was minimal. Impression:               - One diminutive polyp in the transverse colon,                            removed with a cold snare. Resected and retrieved.                           - Diverticulosis in the sigmoid colon.                            - The examination was otherwise normal on direct                            and retroflexion views. Recommendation:           - Patient has a contact number available for                            emergencies. The signs and symptoms of potential                            delayed complications were discussed with the                            patient. Return to normal activities tomorrow.                            Written discharge instructions were provided to the                            patient.                           -  Resume previous diet.                           - Continue present medications.                           - Resume Xarelto (rivaroxaban) at prior dose                            tomorrow.                           - No recommendation at this time regarding repeat                            colonoscopy due to age.                           - Await pathology results. Gatha Mayer, MD 07/23/2019 11:07:52 AM This report has been signed electronically.

## 2019-07-25 ENCOUNTER — Telehealth: Payer: Self-pay | Admitting: *Deleted

## 2019-07-25 NOTE — Telephone Encounter (Signed)
  Follow up Call-  Call back number 07/23/2019  Post procedure Call Back phone  # [phone]  Permission to leave phone message Yes  Some recent data might be hidden     Patient questions:  Do you have a fever, pain , or abdominal swelling? No. Pain Score  0 *  Have you tolerated food without any problems? Yes.    Have you been able to return to your normal activities? Yes.    Do you have any questions about your discharge instructions: Diet   No. Medications  No. Follow up visit  No.  Do you have questions or concerns about your Care? No.  Actions: * If pain score is 4 or above: No action needed, pain <4  1. Have you developed a fever since your procedure? NO  2.   Have you had an respiratory symptoms (SOB or cough) since your procedure? NO  3.   Have you tested positive for COVID 19 since your procedure NO  4.   Have you had any family members/close contacts diagnosed with the COVID 19 since your procedure?  NO   If yes to any of these questions please route to Joylene John, RN and Alphonsa Gin, RN.

## 2019-07-29 DIAGNOSIS — Z20828 Contact with and (suspected) exposure to other viral communicable diseases: Secondary | ICD-10-CM | POA: Diagnosis not present

## 2019-07-30 ENCOUNTER — Encounter: Payer: Self-pay | Admitting: Internal Medicine

## 2019-08-21 ENCOUNTER — Telehealth: Payer: Self-pay | Admitting: Family Medicine

## 2019-08-21 NOTE — Progress Notes (Signed)
  Chronic Care Management   Note  08/21/2019 Name: Troy Willis MRN: [id number] DOB: 1946-02-21  Troy Willis is a 74 y.o. year old male who is a primary care patient of [name], Aundra Millet, MD. I reached out to Valera Castle by phone today in response to a referral sent by Troy Willis PCP, Midge Minium, MD.   Troy Willis was given information about Chronic Care Management services today including:  1. CCM service includes personalized support from designated clinical staff supervised by his physician, including individualized plan of care and coordination with other care providers 2. 24/7 contact phone numbers for assistance for urgent and routine care needs. 3. Service will only be billed when office clinical staff spend 20 minutes or more in a month to coordinate care. 4. Only one practitioner may furnish and bill the service in a calendar month. 5. The patient may stop CCM services at any time (effective at the end of the month) by phone call to the office staff.   Patient did not agree to services and wishes to consider information provided before deciding about enrollment in care management services.   Follow up plan:   Earney Hamburg Upstream Scheduler

## 2019-08-26 ENCOUNTER — Ambulatory Visit (HOSPITAL_COMMUNITY): Payer: PPO | Attending: Internal Medicine

## 2019-08-26 ENCOUNTER — Other Ambulatory Visit: Payer: Self-pay

## 2019-08-26 DIAGNOSIS — I079 Rheumatic tricuspid valve disease, unspecified: Secondary | ICD-10-CM | POA: Diagnosis not present

## 2019-08-26 DIAGNOSIS — I272 Pulmonary hypertension, unspecified: Secondary | ICD-10-CM

## 2019-08-26 DIAGNOSIS — I080 Rheumatic disorders of both mitral and aortic valves: Secondary | ICD-10-CM | POA: Diagnosis not present

## 2019-08-27 ENCOUNTER — Telehealth: Payer: Self-pay

## 2019-08-27 DIAGNOSIS — I272 Pulmonary hypertension, unspecified: Secondary | ICD-10-CM

## 2019-08-27 DIAGNOSIS — I080 Rheumatic disorders of both mitral and aortic valves: Secondary | ICD-10-CM

## 2019-08-27 NOTE — Telephone Encounter (Signed)
-----   Message from Sueanne Margarita, MD sent at 08/26/2019  9:37 PM EDT ----- Overall normal LVF with severely enlarged LA and RA, mild to moderate leakiness of the MV, moderate leakiness of the TV and moderate pulmonary HTN which is unchanged form 1 year ago - repeat echo in 1 year.  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

## 2019-08-27 NOTE — Telephone Encounter (Signed)
The patient has been notified of the result and verbalized understanding.  All questions (if any) were answered. Antonieta Iba, RN 08/27/2019 9:27 AM

## 2019-09-09 ENCOUNTER — Other Ambulatory Visit: Payer: Self-pay | Admitting: Cardiology

## 2019-09-09 NOTE — Telephone Encounter (Signed)
Age 74, weight 74kg, SCr 0.69 on 05/21/19, CrCl 29mL/min. Afib indication, last OV Sept 2020

## 2019-10-01 ENCOUNTER — Ambulatory Visit: Payer: PPO | Admitting: Podiatry

## 2019-10-01 ENCOUNTER — Other Ambulatory Visit: Payer: Self-pay

## 2019-10-01 DIAGNOSIS — B078 Other viral warts: Secondary | ICD-10-CM | POA: Diagnosis not present

## 2019-10-01 DIAGNOSIS — L989 Disorder of the skin and subcutaneous tissue, unspecified: Secondary | ICD-10-CM

## 2019-10-01 DIAGNOSIS — B079 Viral wart, unspecified: Secondary | ICD-10-CM

## 2019-10-01 NOTE — Patient Instructions (Signed)
Keep the bandage on for 24 hours. At that time, remove and clean with soap and water. If it hurts or burns before 24 hours go ahead and remove the bandage and wash with soap and water. Keep the area clean. If there is any blistering cover with antibiotic ointment and a bandage. Monitor for any redness, drainage, or other signs of infection. Call the office if any are to occur. If you have any questions, please call the office at [phone].  
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
## 2019-10-21 ENCOUNTER — Ambulatory Visit (INDEPENDENT_AMBULATORY_CARE_PROVIDER_SITE_OTHER)
Admission: RE | Admit: 2019-10-21 | Discharge: 2019-10-21 | Disposition: A | Discharge status: Home / Self Care | Payer: PPO | Source: Ambulatory Visit | Attending: Acute Care | Admitting: Acute Care

## 2019-10-21 ENCOUNTER — Other Ambulatory Visit: Payer: Self-pay

## 2019-10-21 DIAGNOSIS — R911 Solitary pulmonary nodule: Secondary | ICD-10-CM

## 2019-10-21 DIAGNOSIS — R918 Other nonspecific abnormal finding of lung field: Secondary | ICD-10-CM | POA: Diagnosis not present

## 2019-10-22 ENCOUNTER — Encounter: Payer: Self-pay | Admitting: Family Medicine

## 2019-10-24 NOTE — Progress Notes (Signed)
We will have Dr. Valeta Harms determine follow up schedule for CT Chest at that time

## 2019-10-24 NOTE — Progress Notes (Signed)
Please call patient and let him know the pulmonary nodules we have been following are stable on CT. Please have him set up with Dr. Valeta Harms as a new patient within the next month. He is an old [facility] patient, so needs new office primary pulmonologist. Thanks so much

## 2019-11-12 DIAGNOSIS — L570 Actinic keratosis: Secondary | ICD-10-CM | POA: Diagnosis not present

## 2019-11-12 DIAGNOSIS — L814 Other melanin hyperpigmentation: Secondary | ICD-10-CM | POA: Diagnosis not present

## 2019-11-12 DIAGNOSIS — L578 Other skin changes due to chronic exposure to nonionizing radiation: Secondary | ICD-10-CM | POA: Diagnosis not present

## 2019-11-12 DIAGNOSIS — D485 Neoplasm of uncertain behavior of skin: Secondary | ICD-10-CM | POA: Diagnosis not present

## 2019-11-12 DIAGNOSIS — L308 Other specified dermatitis: Secondary | ICD-10-CM | POA: Diagnosis not present

## 2019-11-12 DIAGNOSIS — D1801 Hemangioma of skin and subcutaneous tissue: Secondary | ICD-10-CM | POA: Diagnosis not present

## 2019-11-12 DIAGNOSIS — L309 Dermatitis, unspecified: Secondary | ICD-10-CM | POA: Diagnosis not present

## 2019-11-12 DIAGNOSIS — L821 Other seborrheic keratosis: Secondary | ICD-10-CM | POA: Diagnosis not present

## 2019-11-19 ENCOUNTER — Ambulatory Visit: Payer: PPO

## 2019-12-09 NOTE — Telephone Encounter (Signed)
Closing encounter d/t duplication.

## 2019-12-09 NOTE — Telephone Encounter (Signed)
Dr. Valeta Harms,  Please see patient's comment and advise.  Thank you.

## 2019-12-12 DIAGNOSIS — L309 Dermatitis, unspecified: Secondary | ICD-10-CM | POA: Diagnosis not present

## 2019-12-16 ENCOUNTER — Encounter: Payer: Self-pay | Admitting: Family Medicine

## 2019-12-16 DIAGNOSIS — H26493 Other secondary cataract, bilateral: Secondary | ICD-10-CM | POA: Diagnosis not present

## 2019-12-16 DIAGNOSIS — H43813 Vitreous degeneration, bilateral: Secondary | ICD-10-CM | POA: Diagnosis not present

## 2019-12-16 DIAGNOSIS — H40013 Open angle with borderline findings, low risk, bilateral: Secondary | ICD-10-CM | POA: Diagnosis not present

## 2019-12-16 DIAGNOSIS — Z961 Presence of intraocular lens: Secondary | ICD-10-CM | POA: Diagnosis not present

## 2019-12-18 ENCOUNTER — Other Ambulatory Visit: Payer: Self-pay

## 2019-12-18 ENCOUNTER — Ambulatory Visit: Payer: PPO | Admitting: Pulmonary Disease

## 2019-12-18 ENCOUNTER — Encounter: Payer: Self-pay | Admitting: Pulmonary Disease

## 2019-12-18 VITALS — BP 116/72 | HR 62 | Temp 98.3°F | Resp 17 | Ht 75.0 in | Wt 166.0 lb

## 2019-12-18 DIAGNOSIS — I272 Pulmonary hypertension, unspecified: Secondary | ICD-10-CM | POA: Diagnosis not present

## 2019-12-18 DIAGNOSIS — J849 Interstitial pulmonary disease, unspecified: Secondary | ICD-10-CM

## 2019-12-18 DIAGNOSIS — J479 Bronchiectasis, uncomplicated: Secondary | ICD-10-CM | POA: Diagnosis not present

## 2019-12-18 NOTE — Patient Instructions (Signed)
Thank you for visiting Dr. Valeta Harms at [facility]. Today we recommend the following:  Continue current regimen Please call with any changes or concerns  Return in about 6 months (around 06/19/2020) for with APP or Dr. Valeta Harms.    Please do your part to reduce the spread of COVID-19.
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
## 2019-12-24 DIAGNOSIS — H26491 Other secondary cataract, right eye: Secondary | ICD-10-CM | POA: Diagnosis not present

## 2020-01-15 ENCOUNTER — Other Ambulatory Visit: Payer: Self-pay

## 2020-01-15 ENCOUNTER — Ambulatory Visit (INDEPENDENT_AMBULATORY_CARE_PROVIDER_SITE_OTHER): Payer: PPO | Admitting: General Practice

## 2020-01-15 DIAGNOSIS — Z23 Encounter for immunization: Secondary | ICD-10-CM

## 2020-01-15 NOTE — Progress Notes (Signed)
HD flu shot given per PCP order.  0.66mL in L deltoid.   Pt tolerated well.

## 2020-01-19 ENCOUNTER — Other Ambulatory Visit: Payer: Self-pay | Admitting: Cardiology

## 2020-01-28 DIAGNOSIS — M25512 Pain in left shoulder: Secondary | ICD-10-CM | POA: Diagnosis not present

## 2020-01-31 ENCOUNTER — Other Ambulatory Visit: Payer: Self-pay

## 2020-01-31 ENCOUNTER — Other Ambulatory Visit: Payer: Self-pay | Admitting: Cardiology

## 2020-01-31 DIAGNOSIS — M25512 Pain in left shoulder: Secondary | ICD-10-CM | POA: Diagnosis not present

## 2020-01-31 MED ORDER — RIVAROXABAN 20 MG PO TABS: ORAL_TABLET | ORAL | 0 refills | Status: DC

## 2020-01-31 MED ORDER — ATORVASTATIN CALCIUM 80 MG PO TABS: 80.0000 mg | ORAL_TABLET | Freq: Every day | ORAL | 0 refills | Status: DC

## 2020-01-31 NOTE — Telephone Encounter (Signed)
Prescription refill request for Xarelto received.  Indication:afib Last office visit:02/05/2019 (has apt scheduled for 03/26/20) Weight:75.3 Age:74 Scr:0.69 CrCl:98 ml/min 90 DS sent to [facility] for Xarelto 20mg 

## 2020-01-31 NOTE — Telephone Encounter (Signed)
*  STAT* If patient is at the pharmacy, call can be transferred to refill team.   1. Which medications need to be refilled? (please list name of each medication and dose if known)  atorvastatin (LIPITOR) 80 MG tablet XARELTO 20 MG TABS tablet  2. Which pharmacy/location (including street and city if local pharmacy) is medication to be sent to? [facility] # [place], [place]  3. Do they need a 30 day or 90 day supply? 69  Pt is scheduled to see Dr. Radford Pax 11/11 but may not have enough medication to last him until then

## 2020-02-04 ENCOUNTER — Telehealth: Payer: Self-pay | Admitting: Family Medicine

## 2020-02-04 ENCOUNTER — Telehealth: Payer: Self-pay | Admitting: *Deleted

## 2020-02-04 DIAGNOSIS — M25512 Pain in left shoulder: Secondary | ICD-10-CM | POA: Diagnosis not present

## 2020-02-04 NOTE — Telephone Encounter (Signed)
Paperwork given to PCP for review. Pt has cardiology and pulmonology providers. Has not seen PCP since January 2021

## 2020-02-04 NOTE — Telephone Encounter (Signed)
   [facility] Pre-operative Risk Assessment    HEARTCARE STAFF: - Please ensure there is not already an duplicate clearance open for this procedure. - Under Visit Info/Reason for Call, type in Other and utilize the format Clearance MM/DD/YY or Clearance TBD. Do not use dashes or single digits. - If request is for dental extraction, please clarify the # of teeth to be extracted.  Request for surgical clearance:  1. What type of surgery is being performed? RT SHOULDER SCOPE ROTATOR CUFF REPAIR  2. When is this surgery scheduled? TBD  3. What type of clearance is required (medical clearance vs. Pharmacy clearance to hold med vs. Both)? BOTH  4. Are there any medications that need to be held prior to surgery and how long? [place]  5. Practice name and name of physician performing surgery? [facility]; DR. DAX VARKEY  6. What is the office phone number? [phone]   7.   What is the office fax number? [place].   Anesthesia type (None, local, MAC, general) CHOICE   Julaine Hua 02/04/2020, 12:37 PM  _________________________________________________________________   (provider comments below)

## 2020-02-04 NOTE — Telephone Encounter (Signed)
Surgical clearance form placed in Dr. Bonney Leitz bin up front

## 2020-02-04 NOTE — Telephone Encounter (Signed)
   Primary Cardiologist: Fransico Him, MD  Chart reviewed and patient contacted by phone today as part of pre-operative protocol coverage. Given past medical history and time since last visit, based on ACC/AHA guidelines, Troy Willis would be at acceptable risk for the planned procedure without further cardiovascular testing.   OK to hold Xarelto two days pre op, resume as soon as safe post op.   The patient was advised that if he develops new symptoms prior to surgery to contact our office to arrange for a follow-up visit, and he verbalized understanding.  I will route this recommendation to the requesting party via Epic fax function and remove from pre-op pool.  Please call with questions.  Kerin Ransom, PA-C 02/04/2020, 2:06 PM

## 2020-02-05 NOTE — Telephone Encounter (Signed)
Appt. Made for 02/07/2020

## 2020-02-05 NOTE — Telephone Encounter (Signed)
Pt would need an appt.

## 2020-02-05 NOTE — Telephone Encounter (Signed)
If surgeon is requiring medical clearance, he will need an appt

## 2020-02-06 ENCOUNTER — Other Ambulatory Visit: Payer: Self-pay | Admitting: General Practice

## 2020-02-06 DIAGNOSIS — Z01818 Encounter for other preprocedural examination: Secondary | ICD-10-CM

## 2020-02-07 ENCOUNTER — Ambulatory Visit (INDEPENDENT_AMBULATORY_CARE_PROVIDER_SITE_OTHER): Payer: PPO | Admitting: Family Medicine

## 2020-02-07 ENCOUNTER — Other Ambulatory Visit: Payer: Self-pay

## 2020-02-07 ENCOUNTER — Encounter: Payer: Self-pay | Admitting: Family Medicine

## 2020-02-07 VITALS — BP 98/60 | HR 63 | Temp 97.3°F | Ht 75.0 in | Wt 168.4 lb

## 2020-02-07 DIAGNOSIS — E7800 Pure hypercholesterolemia, unspecified: Secondary | ICD-10-CM

## 2020-02-07 DIAGNOSIS — Z01818 Encounter for other preprocedural examination: Secondary | ICD-10-CM | POA: Diagnosis not present

## 2020-02-07 LAB — LIPID PANEL
Cholesterol: 125 mg/dL (ref 0–200)
HDL: 60.2 mg/dL (ref >39.00)
LDL Cholesterol: 53 mg/dL (ref 0–99)
NonHDL: 65.21
Total CHOL/HDL Ratio: 2
Triglycerides: 60 mg/dL (ref 0.0–149.0)
VLDL: 12 mg/dL (ref 0.0–40.0)

## 2020-02-07 LAB — CBC WITH DIFFERENTIAL/PLATELET
Basophils Absolute: 0 10*3/uL (ref 0.0–0.1)
Basophils Relative: 1.2 % (ref 0.0–3.0)
Eosinophils Absolute: 0.1 10*3/uL (ref 0.0–0.7)
Eosinophils Relative: 3.4 % (ref 0.0–5.0)
HCT: 35.8 % — ABNORMAL LOW (ref 39.0–52.0)
Hemoglobin: 12.2 g/dL — ABNORMAL LOW (ref 13.0–17.0)
Lymphocytes Relative: 29.3 % (ref 12.0–46.0)
Lymphs Abs: 1.1 10*3/uL (ref 0.7–4.0)
MCHC: 34.2 g/dL (ref 30.0–36.0)
MCV: 105.4 fl — ABNORMAL HIGH (ref 78.0–100.0)
Monocytes Absolute: 0.6 10*3/uL (ref 0.1–1.0)
Monocytes Relative: 15.4 % — ABNORMAL HIGH (ref 3.0–12.0)
Neutro Abs: 1.9 10*3/uL (ref 1.4–7.7)
Neutrophils Relative %: 50.7 % (ref 43.0–77.0)
Platelets: 192 10*3/uL (ref 150.0–400.0)
RBC: 3.39 Mil/uL — ABNORMAL LOW (ref 4.22–5.81)
RDW: 14.3 % (ref 11.5–15.5)
WBC: 3.8 10*3/uL — ABNORMAL LOW (ref 4.0–10.5)

## 2020-02-07 LAB — BASIC METABOLIC PANEL
BUN: 21 mg/dL (ref 6–23)
CO2: 30 mEq/L (ref 19–32)
Calcium: 9.3 mg/dL (ref 8.4–10.5)
Chloride: 102 mEq/L (ref 96–112)
Creatinine, Ser: 0.84 mg/dL (ref 0.40–1.50)
GFR: 89.01 mL/min (ref >60.00)
Glucose, Bld: 86 mg/dL (ref 70–99)
Potassium: 4.7 mEq/L (ref 3.5–5.1)
Sodium: 138 mEq/L (ref 135–145)

## 2020-02-07 LAB — HEPATIC FUNCTION PANEL
ALT: 24 U/L (ref 0–53)
AST: 33 U/L (ref 0–37)
Albumin: 4.2 g/dL (ref 3.5–5.2)
Alkaline Phosphatase: 67 U/L (ref 39–117)
Bilirubin, Direct: 0.1 mg/dL (ref 0.0–0.3)
Total Bilirubin: 0.5 mg/dL (ref 0.2–1.2)
Total Protein: 7.4 g/dL (ref 6.0–8.3)

## 2020-02-07 LAB — TSH: TSH: 3.07 u[IU]/mL (ref 0.35–4.50)

## 2020-02-07 NOTE — Progress Notes (Signed)
Subjective:    Troy Willis is a 74 y.o. male who presents to the office today for a preoperative consultation at the request of surgeon Dr Griffin Basil who plans on performing R rotator cuff repair on TBD  . This consultation is requested for the specific conditions prompting preoperative evaluation (i.e. because of potential affect on operative risk): CAD,. Planned anesthesia: general. The patient has the following known anesthesia issues: no hx of problems. Patients bleeding risk: no recent abnormal bleeding, no remote history of abnormal bleeding and no use of Ca-channel blockers. Patient does not have objections to receiving blood products if needed.  The following portions of the patient's history were reviewed and updated as appropriate: allergies, current medications, past family history, past medical history, past social history, past surgical history and problem list.  Review of Systems A comprehensive review of systems was negative. (no SOB above baseline)   Objective:    BP 98/60   Pulse 63   Temp (!) 97.3 F (36.3 C) (Temporal)   Ht 6\' 3"  (1.905 m)   Wt 168 lb 6.4 oz (76.4 kg)   SpO2 98%   BMI 21.05 kg/m   General Appearance:    Alert, cooperative, no distress, appears stated age  Head:    Normocephalic, without obvious abnormality, atraumatic  Eyes:    PERRL, conjunctiva/corneas clear, EOM's intact, fundi    benign, both eyes       Ears:    Normal TM's and external ear canals, both ears  Nose:   Deferred due to COVID  Throat:   Neck:   Supple, symmetrical, trachea midline, no adenopathy;       thyroid:  No enlargement/tenderness/nodules; no carotid   bruit or JVD  Back:     Symmetric, no curvature, ROM normal, no CVA tenderness  Lungs:     Clear to auscultation bilaterally, respirations unlabored  Chest wall:    No tenderness or deformity  Heart:    Irregular rate and rhythm, S1 and S2 normal, no murmur, rub   or gallop  Abdomen:     Soft, non-tender, bowel sounds  active all four quadrants,    no masses, no organomegaly  Genitalia:    Normal male without lesion, discharge or tenderness  Rectal:    Normal tone, normal prostate, no masses or tenderness;   guaiac negative stool  Extremities:   Extremities normal, atraumatic, no cyanosis or edema  Pulses:   2+ and symmetric all extremities  Skin:   Skin color, texture, turgor normal, no rashes or lesions  Lymph nodes:   Cervical, supraclavicular, and axillary nodes normal  Neurologic:   CNII-XII intact. Normal strength, sensation and reflexes      throughout    Predictors of intubation difficulty:  Morbid obesity? no  Anatomically abnormal facies? no  Prominent incisors? no  Receding mandible? no  Short, thick neck? no  Neck range of motion: normal  Dentition: No chipped, loose, or missing teeth.  Cardiographics ECG: Afib/Aflutter, negative precordial Twaves Echocardiogram: not done  Imaging Chest x-ray: Per pulmonary   Lab Review  No visits with results within 6 Month(s) from this visit.  Latest known visit with results is:  Office Visit on 05/21/2019  Component Date Value  . Sodium 05/21/2019 136   . Potassium 05/21/2019 4.5   . Chloride 05/21/2019 102   . CO2 05/21/2019 30   . Glucose, Bld 05/21/2019 92   . BUN 05/21/2019 20   . Creatinine, Ser 05/21/2019 0.69   .  GFR 05/21/2019 111.91   . Calcium 05/21/2019 9.2   . TSH 05/21/2019 2.96   . Total Bilirubin 05/21/2019 0.5   . Bilirubin, Direct 05/21/2019 0.1   . Alkaline Phosphatase 05/21/2019 65   . AST 05/21/2019 30   . ALT 05/21/2019 27   . Total Protein 05/21/2019 7.0   . Albumin 05/21/2019 3.8   . WBC 05/21/2019 6.2   . RBC 05/21/2019 3.45*  . Hemoglobin 05/21/2019 12.2*  . HCT 05/21/2019 36.4*  . MCV 05/21/2019 105.6*  . MCHC 05/21/2019 33.6   . RDW 05/21/2019 13.8   . Platelets 05/21/2019 221.0   . Neutrophils Relative % 05/21/2019 65.0   . Lymphocytes Relative 05/21/2019 21.8   . Monocytes Relative 05/21/2019  9.1   . Eosinophils Relative 05/21/2019 3.1   . Basophils Relative 05/21/2019 1.0   . Neutro Abs 05/21/2019 4.1   . Lymphs Abs 05/21/2019 1.4   . Monocytes Absolute 05/21/2019 0.6   . Eosinophils Absolute 05/21/2019 0.2   . Basophils Absolute 05/21/2019 0.1   . Vitamin B-12 05/21/2019 532   . Folate 05/21/2019 20.3   . Vitamin B1 (Thiamine) 05/21/2019 23   . Sed Rate 05/21/2019 38*      Assessment:      74 y.o. male with planned surgery as above.   Known risk factors for perioperative complications: Chronic pulmonary disease   Difficulty with intubation is not anticipated.  Cardiac Risk Estimation: low  Current medications which may produce withdrawal symptoms if withheld perioperatively: none     Plan:    1. Preoperative workup as follows ECG, hemoglobin, hematocrit, electrolytes, creatinine, glucose, liver function studies. 2. Change in medication regimen before surgery: D/C Xarelto 2 days prior to surgery and resume day after. 3. Prophylaxis for cardiac events with perioperative beta-blockers: not indicated. 4. Invasive hemodynamic monitoring perioperatively: at the discretion of anesthesiologist. 5. Deep vein thrombosis prophylaxis postoperatively:regimen to be chosen by surgical team. 6. Surveillance for postoperative MI with ECG immediately postoperatively and on postoperative days 1 and 2 AND troponin levels 24 hours postoperatively and on day 4 or hospital discharge (whichever comes first): at the discretion of anesthesiologist. 7. Other measures: Pulmonary clearance

## 2020-02-07 NOTE — Patient Instructions (Signed)
Follow up as needed or as scheduled We'll notify you of your lab results and make any changes if needed Please get Pulmonary's OK to proceed w/ anesthesia Follow the Xarelto instructions per cardiology Call with any questions or concerns GOOD LUCK!!!

## 2020-02-11 ENCOUNTER — Encounter: Payer: Self-pay | Admitting: General Practice

## 2020-03-11 ENCOUNTER — Encounter: Payer: Self-pay | Admitting: Acute Care

## 2020-03-11 ENCOUNTER — Ambulatory Visit: Payer: PPO | Admitting: Acute Care

## 2020-03-11 ENCOUNTER — Other Ambulatory Visit: Payer: Self-pay

## 2020-03-11 VITALS — BP 118/72 | HR 61 | Temp 97.8°F | Ht 75.0 in | Wt 169.6 lb

## 2020-03-11 DIAGNOSIS — J479 Bronchiectasis, uncomplicated: Secondary | ICD-10-CM | POA: Diagnosis not present

## 2020-03-11 DIAGNOSIS — Z01818 Encounter for other preprocedural examination: Secondary | ICD-10-CM

## 2020-03-11 NOTE — Patient Instructions (Addendum)
It is good to see you today. We will clear you for the Rotator Cuff Surgery. Follow up recommendations include: 1. Short duration of surgery as much as possible and avoid paralytic if possible 2. Recovery in step down or ICU with Pulmonary consultation with any complications if done as inpatient  3. DVT prophylaxis, if done as inpatient  4. Aggressive pulmonary toilet with o2, bronchodilatation, and incentive spirometry , Flutter valve, Mucinex daily, and early ambulation.  Mucinex daily as  Mucolytic therapy for his bronchiectasis. He also need to use his flutter valve, IS  as soon as he is able post op to minimize his potential for complication or flare of his bronchiectasis. Continue daily weights to help determine needs for diuretic therapy. Conservative fluid management as patient has [place]. Aggressive treatment with Lasix post op if net fluid +, and take lasix as prescribed. Troy Willis!!  Follow up 3 months after surgery to ensure you are doing well. Call if  You need [place] sooner.  Please contact office for sooner follow up if symptoms do not improve or worsen or seek emergency care

## 2020-03-11 NOTE — Progress Notes (Signed)
History of Present Illness Troy Willis is a 74 y.o. 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 Valeta Harms.  03/05/2020 Pt. Presents for surgical clearance for Rotator Cuff Surgery. He has had a stable interval pulmonary wise. He is compliant with his Mucinex and Lasix.He takes this as needed for lower extremity swelling. He is also compliant with his flutter valve .  He feels this surgery will improve his quality of life.    1) RISK FOR PROLONGED MECHANICAL VENTILAION - > 48h  1A) Arozullah - Prolonged mech ventilation risk Arozullah Postperative Pulmonary Risk Score - for mech ventilation dependence >48h [employer], [place] [facility] 2000, major non-cardiac surgery) Comment Score  Type of surgery - abd ao aneurysm (27), thoracic (21), neurosurgery / upper abdominal / vascular (21), neck (11) Rotator Cuff, orthoscopic 0  Emergency Surgery - (11)  0  ALbumin < 3 or poor nutritional state - (9) 4.2 0  BUN > 30 -  (8) 21 0  Partial or completely dependent functional status - (7) Independent  0  COPD -  (6) Bronchiectasis Bronchiectasis 0  Age - 60 to 81 (4), > 70  (6)  6  TOTAL  6  Risk Stratifcation scores  - < 10 (0.5%), 11-19 (1.8%), 20-27 (4.2%), 28-40 (10.1%), >40 (26.6%)  < 0.5%   Pt risk of Pulmonary Complications :  < 3.8% Cleared for surgery from pulmonary stand point with following recommendations:   Risk ameliorating factors are : Major Pulmonary risks identified in the multifactorial risk analysis are but not limited to a) pneumonia; b) recurrent intubation risk; c) prolonged or recurrent acute respiratory failure needing mechanical ventilation; d) prolonged hospitalization; e) DVT/Pulmonary embolism; f) Acute Pulmonary edema  Recommend 1. Short duration of surgery as much as possible and avoid paralytic if possible 2. Recovery in step down or ICU with Pulmonary consultation with any complications if done as inpatient   3. DVT prophylaxis, if done as inpatient  4. Aggressive pulmonary toilet with o2, bronchodilatation, and incentive spirometry , Flutter valve, Mucinex daily, and early ambulation  Pt needs Mucinex daily as  Mucolytic therapy for his bronchiectasis. He also needs to use his flutter valve as soon as he is able post op to minimize his potential for complication or flare of his bronchiectasis.  I have asked him to call the office for any s/s of flare or breathing issues, change in secretions post op.   Test Results: 11/10/2019 CT Chest Prior right middle lobe nodule has resolved.  Additional scattered bilateral pulmonary nodules measuring up to 7 mm in the left lower lobe, unchanged since 2018, benign. Never smoker   CBC Latest Ref Rng & Units 02/07/2020 05/21/2019 03/26/2019  WBC 4.0 - 10.5 K/uL 3.8(L) 6.2 4.5  Hemoglobin 13.0 - 17.0 g/dL 12.2(L) 12.2(L) 11.2(L)  Hematocrit 39 - 52 % 35.8(L) 36.4(L) 33.3(L)  Platelets 150 - 400 K/uL 192.0 221.0 192.0    BMP Latest Ref Rng & Units 02/07/2020 05/21/2019 03/26/2019  Glucose 70 - 99 mg/dL 86 92 83  BUN 6 - 23 mg/dL 21 20 17   Creatinine 0.40 - 1.50 mg/dL 0.84 0.69 0.71  BUN/Creat Ratio 10 - 24 - - -  Sodium 135 - 145 mEq/L 138 136 136  Potassium 3.5 - 5.1 mEq/L 4.7 4.5 4.7  Chloride 96 - 112 mEq/L 102 102 103  CO2 19 - 32 mEq/L 30 30 29   Calcium 8.4 - 10.5 mg/dL 9.3 9.2 9.0  BNP No results found for: BNP  ProBNP    Component Value Date/Time   PROBNP 225.0 (H) 03/26/2019 0909    PFT    Component Value Date/Time   FEV1PRE 2.90 10/31/2018 0849   FEV1POST 3.05 10/31/2018 0849   FVCPRE 4.03 10/31/2018 0849   FVCPOST 4.15 10/31/2018 0849   TLC 6.40 10/31/2018 0849   DLCOUNC 17.98 10/31/2018 0849   PREFEV1FVCRT 72 10/31/2018 0849   PSTFEV1FVCRT 74 10/31/2018 0849    No results found.   Past medical hx Past Medical History:  Diagnosis Date  . Anemia   . Arthritis    hands, lower back  . CAD (coronary artery disease),  native coronary artery    a. 08/2015 Cath/PCI: LM nl, LAD 106m, LCX small, nl, RCA 80p (4.0x16 Synergy DES),   . Cataracts, bilateral    surgery  . Depression   . Diverticulosis   . GERD (gastroesophageal reflux disease)   . Hilar adenopathy    a. 08/2015 CT Chest: mild bilat hilar adenopathy and mild soft tissue prominence in inf right hilum, left hilum. Also areas of soft tissue and low-density material filling LLL bronchial airways->? mucous vs mass.  [name] Hx of adenomatous colonic polyps 08/13/2015  . Hyperlipidemia   . MVP (mitral valve prolapse)    bileaflet with mild MR by echo 02/2017  . Nonspecific elevation of levels of transaminase or lactic acid dehydrogenase (LDH)    PMH of ; ? due to Amiodarone   . 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 Permanent atrial fibrillation (HCC)    a. CHA2DS2VASc = 2 -->Xarelto started 08/2015.  [name] Pneumonia, bacterial 05/18/2012  . Pulmonary nodules    a. 08/2015 CT Chest: reticulonodular opacities in RUL - largest 46mm - likely 2/2 inflammatory process.     Social History   Tobacco Use  . Smoking status: Never Smoker  . Smokeless tobacco: Former Systems developer    Types: Chew  . Tobacco comment: occasional cigar in past; not regular smoker  Vaping Use  . Vaping Use: Never used  Substance Use Topics  . Alcohol use: Yes    Alcohol/week: 7.0 standard drinks    Types: 7 Cans of beer per week  . Drug use: No    Mr.Larkey reports that he has never smoked. He quit smokeless tobacco use about 41 years ago.  His smokeless tobacco use included chew. He reports current alcohol use of about 7.0 standard drinks of alcohol per week. He reports that he does not use drugs.  Tobacco Cessation: Never smoker  Past surgical hx, Family hx, Social hx all reviewed.  Current Outpatient Medications on File Prior to Visit  Medication Sig  . acetaminophen (TYLENOL) 500 MG tablet Take 1,000 mg by mouth daily.  [name] atorvastatin (LIPITOR) 80 MG tablet Take 1 tablet (80 mg total)  by mouth daily.  . Biotin 1000 MCG tablet Take 1,000 mcg by mouth daily.  . Cholecalciferol (VITAMIN D3) 2000 UNITS TABS Take 1 tablet by mouth daily.   . famotidine (PEPCID) 40 MG tablet Take 40 mg by mouth daily as needed for heartburn or indigestion.  . furosemide (LASIX) 20 MG tablet Take 1 tablet (20 mg total) by mouth daily. (Patient taking differently: Take 20 mg by mouth daily as needed. )  . guaiFENesin (MUCINEX) 600 MG 12 hr tablet Take 1,200 mg by mouth at bedtime.  . hydrocortisone 2.5 % ointment Apply topically.  . Multiple Vitamin (MULTIVITAMIN) tablet Take 1 tablet by mouth daily.  . nitroGLYCERIN (NITROSTAT)  0.4 MG SL tablet Place 1 tablet (0.4 mg total) under the tongue every 5 (five) minutes x 3 doses as needed for chest pain.  [name] Respiratory Therapy Supplies (FLUTTER) DEVI Use as directed  . rivaroxaban (XARELTO) 20 MG TABS tablet TAKE ONE TABLET BY MOUTH ONE TIME DAILY with supper  . triamcinolone cream (KENALOG) 0.1 % Apply topically.   No current facility-administered medications on file prior to visit.     Allergies  Allergen Reactions  . Amiodarone Other (See Comments)    REACTION: elevated liver enzymes  . Metoprolol Other (See Comments)    Hypotension     Review Of Systems:  Constitutional:   No  weight loss, night sweats,  Fevers, chills, fatigue, or  lassitude.  HEENT:   No headaches,  Difficulty swallowing,  Tooth/dental problems, or  Sore throat,                No sneezing, itching, ear ache, nasal congestion, post nasal drip,   CV:  No chest pain,  Orthopnea, PND, swelling in lower extremities, anasarca, dizziness, palpitations, syncope.   GI  No heartburn, indigestion, abdominal pain, nausea, vomiting, diarrhea, change in bowel habits, loss of appetite, bloody stools.   Resp: No shortness of breath with exertion or at rest.  No excess mucus, no productive cough,  No non-productive cough,  No coughing up of blood.  No change in color of mucus.  No  wheezing.  No chest wall deformity  Skin: no rash or lesions.  GU: no dysuria, change in color of urine, no urgency or frequency.  No flank pain, no hematuria   MS:  + joint pain no  swelling.  + decreased range of motion L shoulder.  No back pain.  Psych:  No change in mood or affect. No depression or anxiety.  No memory loss.   Vital Signs BP 118/72 (BP Location: Left Arm, Cuff Size: Normal)   Pulse 61   Temp 97.8 F (36.6 C) (Oral)   Ht 6\' 3"  (1.905 m)   Wt 169 lb 9.6 oz (76.9 kg)   SpO2 97%   BMI 21.20 kg/m    Physical Exam:  General- No distress,  A&Ox3, pleasant  ENT: No sinus tenderness, TM clear, pale nasal mucosa, no oral exudate,no post nasal drip, no LAN Cardiac: S1, S2, regular rate and rhythm, no murmur Chest: No wheeze/ rales/ dullness; no accessory muscle use, no nasal flaring, no sternal retractions Abd.: Soft Non-tender, ND, BS +, Body mass index is 21.2 kg/m. Ext: No clubbing cyanosis, edema Neuro:  normal strength, MAE x 4, A&O x 3, appropriate Skin: No rashes,No lesions,  warm and dry Psych: normal mood and behavior   Assessment/Plan  Surgical Clearance for L Rotator Cuff Surgery Plan  1A) Arozullah - Prolonged mech ventilation risk Arozullah Postperative Pulmonary Risk Score - for mech ventilation dependence >48h [employer], [place] Surg 2000, major non-cardiac surgery) Comment Score  Type of surgery - abd ao aneurysm (27), thoracic (21), neurosurgery / upper abdominal / vascular (21), neck (11) Rotator Cuff, orthoscopic 0  Emergency Surgery - (11)  0  ALbumin < 3 or poor nutritional state - (9) 4.2 0  BUN > 30 -  (8) 21 0  Partial or completely dependent functional status - (7) Independent  0  COPD -  (6) Bronchiectasis Bronchiectasis 0  Age - 60 to 69 (4), > 70  (6)  6  TOTAL  6  Risk Stratifcation scores  - < 10 (  0.5%), 11-19 (1.8%), 20-27 (4.2%), 28-40 (10.1%), >40 (26.6%)  < 0.5%   Pt risk of Pulmonary Complications :  < 4.8% Cleared  for surgery from pulmonary stand point with following recommendations:   Risk ameliorating factors are : Major Pulmonary risks identified in the multifactorial risk analysis are but not limited to a) pneumonia; b) recurrent intubation risk; c) prolonged or recurrent acute respiratory failure needing mechanical ventilation; d) prolonged hospitalization; e) DVT/Pulmonary embolism; f) Acute Pulmonary edema  Recommend 1. Short duration of surgery as much as possible and avoid paralytic if possible 2. Recovery in step down or ICU with Pulmonary consultation with any complications if done as inpatient  3. DVT prophylaxis, if done as inpatient  4. Aggressive pulmonary toilet with o2, bronchodilatation, and incentive spirometry , Flutter valve, Mucinex daily, and early ambulation  Pt needs Mucinex daily as  Mucolytic therapy for his bronchiectasis. He also needs to use his flutter valve as soon as he is able post op to minimize his potential for complication or flare of his bronchiectasis.  Continue daily weights to help determine needs for diuretic therapy.[facility]) Conservative fluid management as patient has [place]. Aggressive treatment with Lasix post op if net fluid +,  take lasix as prescribed for lower extremity edema. Emmit Alexanders!!  Follow up 3 months after surgery to ensure you are doing well. Call if  You need [place] sooner.  Please contact office for sooner follow up if symptoms do not improve or worsen or seek emergency care   I have asked him to call the office for any s/s of flare or breathing issues, change in secretions post op.   This appointment was 30 min long with over 50% of the time in direct face-to-face patient care, assessment, plan of care, and follow-up.   [name], NP 03/11/2020  2:37 PM

## 2020-03-11 NOTE — Progress Notes (Signed)
Thanks for seeing him  Garner Nash, DO [facility] 03/11/2020 9:17 PM

## 2020-03-26 ENCOUNTER — Other Ambulatory Visit: Payer: Self-pay

## 2020-03-26 ENCOUNTER — Ambulatory Visit: Payer: PPO | Admitting: Cardiology

## 2020-03-26 ENCOUNTER — Encounter: Payer: Self-pay | Admitting: Cardiology

## 2020-03-26 VITALS — BP 110/80 | HR 62 | Ht 75.0 in | Wt 167.6 lb

## 2020-03-26 DIAGNOSIS — I079 Rheumatic tricuspid valve disease, unspecified: Secondary | ICD-10-CM | POA: Diagnosis not present

## 2020-03-26 DIAGNOSIS — I272 Pulmonary hypertension, unspecified: Secondary | ICD-10-CM | POA: Diagnosis not present

## 2020-03-26 DIAGNOSIS — I071 Rheumatic tricuspid insufficiency: Secondary | ICD-10-CM

## 2020-03-26 DIAGNOSIS — I4821 Permanent atrial fibrillation: Secondary | ICD-10-CM | POA: Diagnosis not present

## 2020-03-26 DIAGNOSIS — I2510 Atherosclerotic heart disease of native coronary artery without angina pectoris: Secondary | ICD-10-CM | POA: Diagnosis not present

## 2020-03-26 DIAGNOSIS — I080 Rheumatic disorders of both mitral and aortic valves: Secondary | ICD-10-CM | POA: Diagnosis not present

## 2020-03-26 DIAGNOSIS — E7800 Pure hypercholesterolemia, unspecified: Secondary | ICD-10-CM

## 2020-03-26 MED ORDER — RIVAROXABAN 20 MG PO TABS: ORAL_TABLET | ORAL | 3 refills | Status: DC

## 2020-03-26 MED ORDER — ATORVASTATIN CALCIUM 80 MG PO TABS
80.0000 mg | ORAL_TABLET | Freq: Every day | ORAL | 3 refills | Status: DC
Start: 2020-03-26 — End: 2020-11-10

## 2020-03-26 NOTE — Patient Instructions (Signed)
Medication Instructions:  Your physician recommends that you continue on your current medications as directed. Please refer to the Current Medication list given to you today.  *If you need a refill on your cardiac medications before your next appointment, please call your pharmacy*  Testing/Procedures: Your physician has requested that you have an echocardiogram in April 2022. Echocardiography is a painless test that uses sound waves to create images of your heart. It provides your doctor with information about the size and shape of your heart and how well your heart's chambers and valves are working. This procedure takes approximately one hour. There are no restrictions for this procedure.  Follow-Up: At [facility], you and your health needs are our priority.  As part of our continuing mission to provide you with exceptional heart care, we have created designated Provider Care Teams.  These Care Teams include your primary Cardiologist (physician) and Advanced Practice Providers (APPs -  Physician Assistants and Nurse Practitioners) who all work together to provide you with the care you need, when you need it.  Your next appointment:   1 year(s)  The format for your next appointment:   In Person  Provider:   You may see Fransico Him, MD or one of the following Advanced Practice Providers on your designated Care Team:    Melina Copa, PA-C  Ermalinda Barrios, PA-C
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
## 2020-04-01 NOTE — Patient Instructions (Addendum)
DUE TO COVID-19 ONLY ONE VISITOR IS ALLOWED TO COME WITH YOU AND STAY IN THE WAITING ROOM ONLY DURING PRE OP AND PROCEDURE DAY OF SURGERY. THE 1 VISITOR  MAY VISIT WITH YOU AFTER SURGERY IN YOUR PRIVATE ROOM DURING VISITING HOURS ONLY!  YOU NEED TO HAVE A COVID 19 TEST ON: 04/11/20 @ 9:15 AM , THIS TEST MUST BE DONE BEFORE SURGERY,  COVID TESTING SITE [place]  [id number], IT IS ON THE RIGHT GOING OUT [address] APPROXIMATELY  2 MINUTES PAST [employer] ON THE RIGHT. ONCE YOUR COVID TEST IS COMPLETED,  PLEASE BEGIN THE QUARANTINE INSTRUCTIONS AS OUTLINED IN YOUR HANDOUT.                Troy Willis    Your procedure is scheduled on:  04/15/20   Report to [facility] Main  Entrance   Report to admitting at: 6:00 AM     Call this number if you have problems the morning of surgery [phone]    Remember:   NO SOLID FOOD AFTER MIDNIGHT THE NIGHT PRIOR TO SURGERY. NOTHING BY MOUTH EXCEPT CLEAR LIQUIDS UNTIL: 5:30 AM . PLEASE FINISH ENSURE DRINK PER SURGEON ORDER  WHICH NEEDS TO BE COMPLETED AT: 5:30 AM .  CLEAR LIQUID DIET   Foods Allowed                                                                     Foods Excluded  Coffee and tea, regular and decaf                             liquids that you cannot  Plain Jell-O any favor except red or purple                                           see through such as: Fruit ices (not with fruit pulp)                                     milk, soups, orange juice  Iced Popsicles                                    All solid food Carbonated beverages, regular and diet                                    Cranberry, grape and apple juices Sports drinks like Gatorade Lightly seasoned clear broth or consume(fat free) Sugar, honey syrup  Sample Menu Breakfast                                Lunch  Supper Cranberry juice                    Beef broth                             Chicken broth Jell-O                                     Grape juice                           Apple juice Coffee or tea                        Jell-O                                      Popsicle                                                Coffee or tea                        Coffee or tea  _____________________________________________________________________   BRUSH YOUR TEETH MORNING OF SURGERY AND RINSE YOUR MOUTH OUT, NO CHEWING GUM CANDY OR MINTS.     Take these medicines the morning of surgery with A SIP OF WATER: Pepcid.                                You may not have any metal on your body including hair pins and              piercings  Do not wear jewelry, lotions, powders or perfumes, deodorant             Men may shave face and neck.   Do not bring valuables to the hospital. [place].  Contacts, dentures or bridgework may not be worn into surgery.  Leave suitcase in the car. After surgery it may be brought to your room.     Patients discharged the day of surgery will not be allowed to drive home. IF YOU ARE HAVING SURGERY AND GOING HOME THE SAME DAY, YOU MUST HAVE AN ADULT TO DRIVE YOU HOME AND BE WITH YOU FOR 24 HOURS. YOU MAY GO HOME BY TAXI OR UBER OR ORTHERWISE, BUT AN ADULT MUST ACCOMPANY YOU HOME AND STAY WITH YOU FOR 24 HOURS.  Name and phone number of your driver:  Special Instructions: N/A              Please read over the following fact sheets you were given: _____________________________________________________________________          [facility] - Preparing for Surgery Before surgery, you can play an important role.  Because skin is not sterile, your skin needs to be as free of germs as possible.  You can reduce the number of germs on your skin by washing with  CHG (chlorahexidine gluconate) soap before surgery.  CHG is an antiseptic cleaner which kills germs and bonds with the skin to continue  killing germs even after washing. Please DO NOT use if you have an allergy to CHG or antibacterial soaps.  If your skin becomes reddened/irritated stop using the CHG and inform your nurse when you arrive at Short Stay. Do not shave (including legs and underarms) for at least 48 hours prior to the first CHG shower.  You may shave your face/neck. Please follow these instructions carefully:  1.  Shower with CHG Soap the night before surgery and the  morning of Surgery.  2.  If you choose to wash your hair, wash your hair first as usual with your  normal  shampoo.  3.  After you shampoo, rinse your hair and body thoroughly to remove the  shampoo.                           4.  Use CHG as you would any other liquid soap.  You can apply chg directly  to the skin and wash                       Gently with a scrungie or clean washcloth.  5.  Apply the CHG Soap to your body ONLY FROM THE NECK DOWN.   Do not use on face/ open                           Wound or open sores. Avoid contact with eyes, ears mouth and genitals (private parts).                       Wash face,  Genitals (private parts) with your normal soap.             6.  Wash thoroughly, paying special attention to the area where your surgery  will be performed.  7.  Thoroughly rinse your body with warm water from the neck down.  8.  DO NOT shower/wash with your normal soap after using and rinsing off  the CHG Soap.                9.  Pat yourself dry with a clean towel.            10.  Wear clean pajamas.            11.  Place clean sheets on your bed the night of your first shower and do not  sleep with pets. Day of Surgery : Do not apply any lotions/deodorants the morning of surgery.  Please wear clean clothes to the hospital/surgery center.  FAILURE TO FOLLOW THESE INSTRUCTIONS MAY RESULT IN THE CANCELLATION OF YOUR SURGERY PATIENT SIGNATURE_________________________________  NURSE  SIGNATURE__________________________________  ________________________________________________________________________   Troy Willis  An incentive spirometer is a tool that can help keep your lungs clear and active. This tool measures how well you are filling your lungs with each breath. Taking long deep breaths may help reverse or decrease the chance of developing breathing (pulmonary) problems (especially infection) following:  A long period of time when you are unable to move or be active. BEFORE THE PROCEDURE   If the spirometer includes an indicator to show your best effort, your nurse or respiratory therapist will set it to a desired goal.  If possible, sit up straight or  lean slightly forward. Try not to slouch.  Hold the incentive spirometer in an upright position. INSTRUCTIONS FOR USE  1. Sit on the edge of your bed if possible, or sit up as far as you can in bed or on a chair. 2. Hold the incentive spirometer in an upright position. 3. Breathe out normally. 4. Place the mouthpiece in your mouth and seal your lips tightly around it. 5. Breathe in slowly and as deeply as possible, raising the piston or the ball toward the top of the column. 6. Hold your breath for 3-5 seconds or for as long as possible. Allow the piston or ball to fall to the bottom of the column. 7. Remove the mouthpiece from your mouth and breathe out normally. 8. Rest for a few seconds and repeat Steps 1 through 7 at least 10 times every 1-2 hours when you are awake. Take your time and take a few normal breaths between deep breaths. 9. The spirometer may include an indicator to show your best effort. Use the indicator as a goal to work toward during each repetition. 10. After each set of 10 deep breaths, practice coughing to be sure your lungs are clear. If you have an incision (the cut made at the time of surgery), support your incision when coughing by placing a pillow or rolled up towels firmly  against it. Once you are able to get out of bed, walk around indoors and cough well. You may stop using the incentive spirometer when instructed by your caregiver.  RISKS AND COMPLICATIONS  Take your time so you do not get dizzy or light-headed.  If you are in pain, you may need to take or ask for pain medication before doing incentive spirometry. It is harder to take a deep breath if you are having pain. AFTER USE  Rest and breathe slowly and easily.  It can be helpful to keep track of a log of your progress. Your caregiver can provide you with a simple table to help with this. If you are using the spirometer at home, follow these instructions: [place] IF:   You are having difficultly using the spirometer.  You have trouble using the spirometer as often as instructed.  Your pain medication is not giving enough relief while using the spirometer.  You develop fever of 100.5 F (38.1 C) or higher. SEEK IMMEDIATE MEDICAL CARE IF:   You cough up bloody sputum that had not been present before.  You develop fever of 102 F (38.9 C) or greater.  You develop worsening pain at or near the incision site. MAKE SURE YOU:   Understand these instructions.  Will watch your condition.  Will get help right away if you are not doing well or get worse. Document Released: 09/12/2006 Document Revised: 07/25/2011 Document Reviewed: 11/13/2006 [facility] Patient Information 2014 [place], [place].   ________________________________________________________________________

## 2020-04-02 ENCOUNTER — Other Ambulatory Visit: Payer: Self-pay

## 2020-04-02 ENCOUNTER — Encounter (HOSPITAL_COMMUNITY): Payer: Self-pay

## 2020-04-02 ENCOUNTER — Encounter (HOSPITAL_COMMUNITY)
Admission: RE | Admit: 2020-04-02 | Discharge: 2020-04-02 | Disposition: A | Discharge status: Home / Self Care | Payer: PPO | Source: Ambulatory Visit | Attending: Orthopaedic Surgery | Admitting: Orthopaedic Surgery

## 2020-04-02 DIAGNOSIS — Z01812 Encounter for preprocedural laboratory examination: Secondary | ICD-10-CM | POA: Diagnosis not present

## 2020-04-02 HISTORY — DX: Cerebral infarction, unspecified: I63.9

## 2020-04-02 HISTORY — DX: Acute myocardial infarction, unspecified: I21.9

## 2020-04-02 HISTORY — DX: Bronchiectasis, uncomplicated: J47.9

## 2020-04-02 HISTORY — DX: Malignant (primary) neoplasm, unspecified: C80.1

## 2020-04-02 HISTORY — DX: Cardiac arrhythmia, unspecified: I49.9

## 2020-04-02 LAB — BASIC METABOLIC PANEL
Anion gap: 7 (ref 5–15)
BUN: 20 mg/dL (ref 8–23)
CO2: 28 mmol/L (ref 22–32)
Calcium: 9.2 mg/dL (ref 8.9–10.3)
Chloride: 103 mmol/L (ref 98–111)
Creatinine, Ser: 0.67 mg/dL (ref 0.61–1.24)
GFR, Estimated: >60 mL/min (ref >60)
Glucose, Bld: 100 mg/dL — ABNORMAL HIGH (ref 70–99)
Potassium: 4.7 mmol/L (ref 3.5–5.1)
Sodium: 138 mmol/L (ref 135–145)

## 2020-04-02 LAB — CBC
HCT: 34.9 % — ABNORMAL LOW (ref 39.0–52.0)
Hemoglobin: 11.5 g/dL — ABNORMAL LOW (ref 13.0–17.0)
MCH: 35.3 pg — ABNORMAL HIGH (ref 26.0–34.0)
MCHC: 33 g/dL (ref 30.0–36.0)
MCV: 107.1 fL — ABNORMAL HIGH (ref 80.0–100.0)
Platelets: 183 10*3/uL (ref 150–400)
RBC: 3.26 MIL/uL — ABNORMAL LOW (ref 4.22–5.81)
RDW: 13.2 % (ref 11.5–15.5)
WBC: 6.6 10*3/uL (ref 4.0–10.5)
nRBC: 0 % (ref 0.0–0.2)

## 2020-04-02 NOTE — Progress Notes (Addendum)
COVID Vaccine Completed: Yes Date COVID Vaccine completed: 02/2020 [place] COVID vaccine manufacturer:     Moderna      PCP - Dr. Annye Asa. LOV: 02/07/20. : Clearance Cardiologist - Dr. Fransico Him. LOV: 03/26/20. : Clearance.  Chest x-ray - CT chest: 10/21/19. EPIC EKG - 02/07/20. Epic. Stress Test -  ECHO - 08/26/19. Epic. Cardiac Cath -  Pacemaker/ICD device last checked:  Sleep Study -  CPAP -   Fasting Blood Sugar -  Checks Blood Sugar _____ times a day  Blood Thinner Instructions: Xarelto. Hold two days before surgery as per Dr. Theodosia Blender instructions. Aspirin Instructions: Last Dose:  Anesthesia review: Hx: CAD,MVP,Afib.  Patient denies shortness of breath, fever, cough and chest pain at PAT appointment   Patient verbalized understanding of instructions that were given to them at the PAT appointment. Patient was also instructed that they will need to review over the PAT instructions again at home before surgery.

## 2020-04-03 NOTE — Anesthesia Preprocedure Evaluation (Addendum)
Anesthesia Evaluation  Patient identified by MRN, date of birth, ID band Patient awake    Reviewed: Allergy & Precautions, NPO status , Patient's Chart, lab work & pertinent test results  History of Anesthesia Complications Negative for: history of anesthetic complications  Airway Mallampati: III  TM Distance: <3 FB Neck ROM: Full    Dental  (+) Dental Advisory Given, Teeth Intact   Pulmonary COPD,    Pulmonary exam normal        Cardiovascular (-) angina+ CAD, + Past MI and + Cardiac Stents  Normal cardiovascular exam+ dysrhythmias Atrial Fibrillation + Valvular Problems/Murmurs MR    '21 TTE - EF 60 to 65%. Moderately elevated pulmonary artery systolic pressure. LA and RA were severely dilated. Mild to moderate mitral valve regurgitation. Tricuspid valve regurgitation is moderate. Mild aortic valve sclerosis is present, with no evidence of aortic valve stenosis.     Neuro/Psych PSYCHIATRIC DISORDERS Depression CVA, No Residual Symptoms    GI/Hepatic Neg liver ROS, GERD  Medicated and Controlled,  Endo/Other  negative endocrine ROS  Renal/GU negative Renal ROS     Musculoskeletal  (+) Arthritis ,   Abdominal   Peds  Hematology  (+) anemia ,  On xarelto last taken Sunday     Anesthesia Other Findings Covid test negative See PAT note   Reproductive/Obstetrics                            Anesthesia Physical Anesthesia Plan  ASA: III  Anesthesia Plan: General   Post-op Pain Management:  Regional for Post-op pain   Induction: Intravenous  PONV Risk Score and Plan: 2 and Treatment may vary due to age or medical condition, Ondansetron and Dexamethasone  Airway Management Planned: Oral ETT  Additional Equipment: None  Intra-op Plan:   Post-operative Plan: Extubation in OR  Informed Consent: I have reviewed the patients History and Physical, chart, labs and discussed the procedure  including the risks, benefits and alternatives for the proposed anesthesia with the patient or authorized representative who has indicated his/her understanding and acceptance.     Dental advisory given  Plan Discussed with: CRNA and Anesthesiologist  Anesthesia Plan Comments:       Anesthesia Quick Evaluation
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
## 2020-04-11 ENCOUNTER — Other Ambulatory Visit (HOSPITAL_COMMUNITY)
Admission: RE | Admit: 2020-04-11 | Discharge: 2020-04-11 | Disposition: A | Discharge status: Home / Self Care | Payer: PPO | Source: Ambulatory Visit | Attending: Orthopaedic Surgery | Admitting: Orthopaedic Surgery

## 2020-04-11 DIAGNOSIS — Z20822 Contact with and (suspected) exposure to covid-19: Secondary | ICD-10-CM | POA: Diagnosis not present

## 2020-04-11 DIAGNOSIS — Z01812 Encounter for preprocedural laboratory examination: Secondary | ICD-10-CM | POA: Insufficient documentation

## 2020-04-12 LAB — SARS CORONAVIRUS 2 (TAT 6-24 HRS): SARS Coronavirus 2: NEGATIVE
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
## 2020-04-15 ENCOUNTER — Ambulatory Visit (HOSPITAL_COMMUNITY): Payer: PPO | Admitting: Certified Registered Nurse Anesthetist

## 2020-04-15 ENCOUNTER — Encounter (HOSPITAL_COMMUNITY): Payer: Self-pay | Admitting: Orthopaedic Surgery

## 2020-04-15 ENCOUNTER — Encounter (HOSPITAL_COMMUNITY)
Admission: RE | Disposition: A | Discharge status: Home / Self Care | Payer: Self-pay | Source: Home / Self Care | Attending: Orthopaedic Surgery

## 2020-04-15 ENCOUNTER — Ambulatory Visit (HOSPITAL_COMMUNITY)
Admission: RE | Admit: 2020-04-15 | Discharge: 2020-04-15 | Disposition: A | Discharge status: Home / Self Care | Payer: PPO | Attending: Orthopaedic Surgery | Admitting: Orthopaedic Surgery

## 2020-04-15 ENCOUNTER — Ambulatory Visit (HOSPITAL_COMMUNITY): Payer: PPO | Admitting: Physician Assistant

## 2020-04-15 DIAGNOSIS — K219 Gastro-esophageal reflux disease without esophagitis: Secondary | ICD-10-CM | POA: Insufficient documentation

## 2020-04-15 DIAGNOSIS — G8918 Other acute postprocedural pain: Secondary | ICD-10-CM | POA: Diagnosis not present

## 2020-04-15 DIAGNOSIS — M7542 Impingement syndrome of left shoulder: Secondary | ICD-10-CM | POA: Diagnosis not present

## 2020-04-15 DIAGNOSIS — I2510 Atherosclerotic heart disease of native coronary artery without angina pectoris: Secondary | ICD-10-CM | POA: Insufficient documentation

## 2020-04-15 DIAGNOSIS — Z7901 Long term (current) use of anticoagulants: Secondary | ICD-10-CM | POA: Insufficient documentation

## 2020-04-15 DIAGNOSIS — Z79899 Other long term (current) drug therapy: Secondary | ICD-10-CM | POA: Diagnosis not present

## 2020-04-15 DIAGNOSIS — M75122 Complete rotator cuff tear or rupture of left shoulder, not specified as traumatic: Secondary | ICD-10-CM | POA: Diagnosis not present

## 2020-04-15 DIAGNOSIS — J479 Bronchiectasis, uncomplicated: Secondary | ICD-10-CM | POA: Diagnosis not present

## 2020-04-15 DIAGNOSIS — I272 Pulmonary hypertension, unspecified: Secondary | ICD-10-CM | POA: Insufficient documentation

## 2020-04-15 DIAGNOSIS — Z85828 Personal history of other malignant neoplasm of skin: Secondary | ICD-10-CM | POA: Diagnosis not present

## 2020-04-15 DIAGNOSIS — I252 Old myocardial infarction: Secondary | ICD-10-CM | POA: Diagnosis not present

## 2020-04-15 DIAGNOSIS — M24112 Other articular cartilage disorders, left shoulder: Secondary | ICD-10-CM | POA: Diagnosis not present

## 2020-04-15 DIAGNOSIS — Z8673 Personal history of transient ischemic attack (TIA), and cerebral infarction without residual deficits: Secondary | ICD-10-CM | POA: Diagnosis not present

## 2020-04-15 DIAGNOSIS — M7522 Bicipital tendinitis, left shoulder: Secondary | ICD-10-CM | POA: Diagnosis not present

## 2020-04-15 DIAGNOSIS — I4821 Permanent atrial fibrillation: Secondary | ICD-10-CM | POA: Diagnosis not present

## 2020-04-15 DIAGNOSIS — M75102 Unspecified rotator cuff tear or rupture of left shoulder, not specified as traumatic: Secondary | ICD-10-CM | POA: Diagnosis not present

## 2020-04-15 DIAGNOSIS — M7552 Bursitis of left shoulder: Secondary | ICD-10-CM | POA: Diagnosis not present

## 2020-04-15 HISTORY — PX: SHOULDER ARTHROSCOPY WITH ROTATOR CUFF REPAIR AND SUBACROMIAL DECOMPRESSION: SHX5686

## 2020-04-15 SURGERY — SHOULDER ARTHROSCOPY WITH ROTATOR CUFF REPAIR AND SUBACROMIAL DECOMPRESSION
Anesthesia: General | Site: Shoulder | Laterality: Left

## 2020-04-15 MED ORDER — DEXAMETHASONE SODIUM PHOSPHATE 10 MG/ML IJ SOLN
INTRAMUSCULAR | Status: DC | PRN
  Administered 2020-04-15: 10 mg via INTRAVENOUS

## 2020-04-15 MED ORDER — ONDANSETRON HCL 4 MG PO TABS: 4.0000 mg | ORAL_TABLET | Freq: Three times a day (TID) | ORAL | 1 refills | Status: AC | PRN

## 2020-04-15 MED ORDER — ONDANSETRON HCL 4 MG/2ML IJ SOLN: 4.0000 mg | Freq: Once | INTRAMUSCULAR | Status: DC | PRN

## 2020-04-15 MED ORDER — SUCCINYLCHOLINE CHLORIDE 200 MG/10ML IV SOSY
PREFILLED_SYRINGE | INTRAVENOUS | Status: AC
  Filled 2020-04-15: qty 10

## 2020-04-15 MED ORDER — BUPIVACAINE HCL (PF) 0.5 % IJ SOLN
INTRAMUSCULAR | Status: DC | PRN
  Administered 2020-04-15: 15 mL via PERINEURAL

## 2020-04-15 MED ORDER — ROCURONIUM BROMIDE 10 MG/ML (PF) SYRINGE
PREFILLED_SYRINGE | INTRAVENOUS | Status: AC
  Filled 2020-04-15: qty 10

## 2020-04-15 MED ORDER — PROPOFOL 10 MG/ML IV BOLUS
INTRAVENOUS | Status: AC
  Filled 2020-04-15: qty 20

## 2020-04-15 MED ORDER — PHENYLEPHRINE HCL-NACL 10-0.9 MG/250ML-% IV SOLN
INTRAVENOUS | Status: DC | PRN
  Administered 2020-04-15: 40 ug/min via INTRAVENOUS

## 2020-04-15 MED ORDER — LACTATED RINGERS IV SOLN: INTRAVENOUS | Status: DC

## 2020-04-15 MED ORDER — PROPOFOL 10 MG/ML IV BOLUS
INTRAVENOUS | Status: DC | PRN
  Administered 2020-04-15: 130 mg via INTRAVENOUS

## 2020-04-15 MED ORDER — ACETAMINOPHEN 500 MG PO TABS: 1000.0000 mg | ORAL_TABLET | Freq: Three times a day (TID) | ORAL | 0 refills | Status: AC

## 2020-04-15 MED ORDER — CEFAZOLIN SODIUM-DEXTROSE 2-4 GM/100ML-% IV SOLN
INTRAVENOUS | Status: AC
  Filled 2020-04-15: qty 100

## 2020-04-15 MED ORDER — FENTANYL CITRATE (PF) 100 MCG/2ML IJ SOLN
INTRAMUSCULAR | Status: AC
  Filled 2020-04-15: qty 2

## 2020-04-15 MED ORDER — SODIUM CHLORIDE 0.9 % IR SOLN
Status: DC | PRN
  Administered 2020-04-15: 12000 mL

## 2020-04-15 MED ORDER — SUGAMMADEX SODIUM 200 MG/2ML IV SOLN
INTRAVENOUS | Status: DC | PRN
  Administered 2020-04-15: 200 mg via INTRAVENOUS

## 2020-04-15 MED ORDER — OXYCODONE HCL 5 MG/5ML PO SOLN: 5.0000 mg | Freq: Once | ORAL | Status: DC | PRN

## 2020-04-15 MED ORDER — LIDOCAINE 2% (20 MG/ML) 5 ML SYRINGE
INTRAMUSCULAR | Status: DC | PRN
  Administered 2020-04-15: 60 mg via INTRAVENOUS

## 2020-04-15 MED ORDER — ONDANSETRON HCL 4 MG/2ML IJ SOLN
INTRAMUSCULAR | Status: AC
  Filled 2020-04-15: qty 2

## 2020-04-15 MED ORDER — CEFAZOLIN SODIUM-DEXTROSE 2-4 GM/100ML-% IV SOLN
2.0000 g | INTRAVENOUS | Status: AC
  Administered 2020-04-15: 2 g via INTRAVENOUS

## 2020-04-15 MED ORDER — SODIUM CHLORIDE 0.9 % IR SOLN
Status: DC | PRN
  Administered 2020-04-15 (×2): 1 mL

## 2020-04-15 MED ORDER — FENTANYL CITRATE (PF) 100 MCG/2ML IJ SOLN: 25.0000 ug | INTRAMUSCULAR | Status: DC | PRN

## 2020-04-15 MED ORDER — DEXAMETHASONE SODIUM PHOSPHATE 10 MG/ML IJ SOLN
INTRAMUSCULAR | Status: AC
  Filled 2020-04-15: qty 1

## 2020-04-15 MED ORDER — EPINEPHRINE PF 1 MG/ML IJ SOLN
INTRAMUSCULAR | Status: AC
  Filled 2020-04-15: qty 3

## 2020-04-15 MED ORDER — MIDAZOLAM HCL 2 MG/2ML IJ SOLN
INTRAMUSCULAR | Status: AC
  Filled 2020-04-15: qty 2

## 2020-04-15 MED ORDER — BUPIVACAINE LIPOSOME 1.3 % IJ SUSP
INTRAMUSCULAR | Status: DC | PRN
  Administered 2020-04-15: 10 mL via PERINEURAL

## 2020-04-15 MED ORDER — CHLORHEXIDINE GLUCONATE 0.12 % MT SOLN
15.0000 mL | Freq: Once | OROMUCOSAL | Status: AC
  Administered 2020-04-15: 15 mL via OROMUCOSAL

## 2020-04-15 MED ORDER — ROCURONIUM BROMIDE 10 MG/ML (PF) SYRINGE
PREFILLED_SYRINGE | INTRAVENOUS | Status: DC | PRN
  Administered 2020-04-15: 50 mg via INTRAVENOUS

## 2020-04-15 MED ORDER — MIDAZOLAM HCL 5 MG/5ML IJ SOLN
INTRAMUSCULAR | Status: DC | PRN
  Administered 2020-04-15: 1 mg via INTRAVENOUS

## 2020-04-15 MED ORDER — ORAL CARE MOUTH RINSE: 15.0000 mL | Freq: Once | OROMUCOSAL | Status: AC

## 2020-04-15 MED ORDER — ONDANSETRON HCL 4 MG/2ML IJ SOLN
INTRAMUSCULAR | Status: DC | PRN
  Administered 2020-04-15: 4 mg via INTRAVENOUS

## 2020-04-15 MED ORDER — OXYCODONE HCL 5 MG PO TABS
ORAL_TABLET | ORAL | 0 refills | Status: AC
Start: 2020-04-15 — End: 2020-04-20

## 2020-04-15 MED ORDER — FENTANYL CITRATE (PF) 100 MCG/2ML IJ SOLN
INTRAMUSCULAR | Status: DC | PRN
  Administered 2020-04-15: 50 ug via INTRAVENOUS

## 2020-04-15 MED ORDER — PHENYLEPHRINE HCL (PRESSORS) 10 MG/ML IV SOLN
INTRAVENOUS | Status: AC
  Filled 2020-04-15: qty 1

## 2020-04-15 MED ORDER — OXYCODONE HCL 5 MG PO TABS: 5.0000 mg | ORAL_TABLET | Freq: Once | ORAL | Status: DC | PRN

## 2020-04-15 SURGICAL SUPPLY — 74 items
ANCHOR SUT 1.8 FBRTK KNTLS 2SU (Anchor) ×4 IMPLANT
ANCHOR SUT BIO SW 4.75X19.1 (Anchor) ×8 IMPLANT
ANCHOR SUT SWIVELLOK BIO (Anchor) ×2 IMPLANT
BENZOIN TINCTURE PRP APPL 2/3 (GAUZE/BANDAGES/DRESSINGS) ×2 IMPLANT
BLADE EXCALIBUR 4.0X13 (MISCELLANEOUS) ×2 IMPLANT
BLADE HEX COATED 2.75 (ELECTRODE) IMPLANT
BLADE SHAVER BONE 5.0X13 (MISCELLANEOUS) IMPLANT
BLADE SURG SZ10 CARB STEEL (BLADE) IMPLANT
BNDG COHESIVE 4X5 TAN STRL (GAUZE/BANDAGES/DRESSINGS) IMPLANT
BURR OVAL 8 FLU 4.0X13 (MISCELLANEOUS) IMPLANT
CANNULA 5.75X71 LONG (CANNULA) IMPLANT
CANNULA PASSPORT 5 (CANNULA) IMPLANT
CANNULA PASSPORT BUTTON 10-40 (CANNULA) IMPLANT
CANNULA PASSPORT BUTTON 8X4 (CANNULA) ×2 IMPLANT
CANNULA TWIST IN 8.25X7CM (CANNULA) IMPLANT
CHLORAPREP W/TINT 26 (MISCELLANEOUS) ×2 IMPLANT
CLSR STERI-STRIP ANTIMIC 1/2X4 (GAUZE/BANDAGES/DRESSINGS) ×2 IMPLANT
COVER WAND RF STERILE (DRAPES) ×2 IMPLANT
DECANTER SPIKE VIAL GLASS SM (MISCELLANEOUS) IMPLANT
DISSECTOR 3.5MM X 13CM CVD (MISCELLANEOUS) IMPLANT
DISSECTOR 4.0MMX13CM CVD (MISCELLANEOUS) IMPLANT
DRAPE IMP U-DRAPE 54X76 (DRAPES) ×4 IMPLANT
DRAPE INCISE IOBAN 66X45 STRL (DRAPES) IMPLANT
DRAPE SHEET LG 3/4 BI-LAMINATE (DRAPES) IMPLANT
DRAPE SHOULDER BEACH CHAIR (DRAPES) IMPLANT
DRSG PAD ABDOMINAL 8X10 ST (GAUZE/BANDAGES/DRESSINGS) ×4 IMPLANT
DW OUTFLOW CASSETTE/TUBE SET (MISCELLANEOUS) ×2 IMPLANT
ELECT NEEDLE TIP 2.8 STRL (NEEDLE) IMPLANT
ELECT REM PT RETURN 9FT ADLT (ELECTROSURGICAL) ×2
ELECTRODE REM PT RTRN 9FT ADLT (ELECTROSURGICAL) ×1 IMPLANT
GAUZE SPONGE 4X4 12PLY STRL (GAUZE/BANDAGES/DRESSINGS) ×2 IMPLANT
GLOVE BIOGEL PI IND STRL 8 (GLOVE) ×2 IMPLANT
GLOVE BIOGEL PI INDICATOR 8 (GLOVE) ×2
GLOVE ECLIPSE 8.0 STRL XLNG CF (GLOVE) ×2 IMPLANT
GOWN STRL REUS W/ TWL LRG LVL3 (GOWN DISPOSABLE) ×1 IMPLANT
GOWN STRL REUS W/TWL LRG LVL3 (GOWN DISPOSABLE) ×2
GOWN STRL REUS W/TWL XL LVL3 (GOWN DISPOSABLE) ×2 IMPLANT
KIT BASIN OR (CUSTOM PROCEDURE TRAY) ×2 IMPLANT
KIT STABILIZATION SHOULDER (MISCELLANEOUS) ×2 IMPLANT
KIT STR SPEAR 1.8 FBRTK DISP (KITS) IMPLANT
LASSO CRESCENT QUICKPASS (SUTURE) IMPLANT
MANIFOLD NEPTUNE II (INSTRUMENTS) ×2 IMPLANT
NDL SAFETY ECLIPSE 18X1.5 (NEEDLE) ×1 IMPLANT
NDL SUT 6 .5 CRC .975X.05 MAYO (NEEDLE) IMPLANT
NEEDLE HYPO 18GX1.5 SHARP (NEEDLE) ×2
NEEDLE MAYO TAPER (NEEDLE)
NEEDLE SCORPION MULTI FIRE (NEEDLE) ×2 IMPLANT
PACK ARTHROSCOPY DSU (CUSTOM PROCEDURE TRAY) ×2 IMPLANT
PENCIL SMOKE EVACUATOR (MISCELLANEOUS) IMPLANT
PORT APPOLLO RF 90DEGREE MULTI (SURGICAL WAND) ×2 IMPLANT
RESTRAINT HEAD UNIVERSAL NS (MISCELLANEOUS) ×2 IMPLANT
SLEEVE SCD COMPRESS KNEE MED (MISCELLANEOUS) ×2 IMPLANT
SLING ARM FOAM STRAP LRG (SOFTGOODS) IMPLANT
SLING ARM FOAM STRAP MED (SOFTGOODS) IMPLANT
SLING ARM FOAM STRAP XLG (SOFTGOODS) IMPLANT
SLING ARM IMMOBILIZER LRG (SOFTGOODS) IMPLANT
SLING ARM IMMOBILIZER MED (SOFTGOODS) IMPLANT
SLING ULTRA II L (ORTHOPEDIC SUPPLIES) ×2 IMPLANT
SPONGE LAP 4X18 RFD (DISPOSABLE) IMPLANT
STRIP CLOSURE SKIN 1/2X4 (GAUZE/BANDAGES/DRESSINGS) IMPLANT
SUT FIBERWIRE #2 38 T-5 BLUE (SUTURE)
SUT MNCRL AB 4-0 PS2 18 (SUTURE) ×2 IMPLANT
SUT PDS AB 1 CT  36 (SUTURE) ×2
SUT PDS AB 1 CT 36 (SUTURE) ×1 IMPLANT
SUT TIGER TAPE 7 IN WHITE (SUTURE) IMPLANT
SUTURE FIBERWR #2 38 T-5 BLUE (SUTURE) IMPLANT
SUTURE TAPE TIGERLINK 1.3MM BL (SUTURE) ×3 IMPLANT
SUTURETAPE TIGERLINK 1.3MM BL (SUTURE) ×6
SYR 10ML LL (SYRINGE) ×2 IMPLANT
TAPE FIBER 2MM 7IN #2 BLUE (SUTURE) IMPLANT
TOWEL OR 17X26 10 PK STRL BLUE (TOWEL DISPOSABLE) ×2 IMPLANT
TUBE CONNECTING 12X1/4 (SUCTIONS) ×2 IMPLANT
TUBE SUCTION HIGH CAP CLEAR NV (SUCTIONS) IMPLANT
TUBING ARTHROSCOPY IRRIG 16FT (MISCELLANEOUS) ×2 IMPLANT

## 2020-04-15 NOTE — Transfer of Care (Signed)
Immediate Anesthesia Transfer of Care Note  Patient: Troy Willis  Procedure(s) Performed: LEFT SHOULDER ARTHROSCOPY DEBRIDEMENT WITH ROTATOR CUFF REPAIR AND SUBACROMIAL DECOMPRESSION BICEP TENODESIS (Left Shoulder)  Patient Location: PACU  Anesthesia Type:General  Level of Consciousness: drowsy and patient cooperative  Airway & Oxygen Therapy: Patient Spontanous Breathing and Patient connected to face mask oxygen  Post-op Assessment: Report given to RN and Post -op Vital signs reviewed and stable  Post vital signs: Reviewed and stable  Last Vitals:  Vitals Value Taken Time  BP    Temp    Pulse    Resp    SpO2      Last Pain:  Vitals:   04/15/20 0644  TempSrc:   PainSc: 0-No pain         Complications: No complications documented.

## 2020-04-15 NOTE — Anesthesia Procedure Notes (Signed)
Anesthesia Regional Block: Interscalene brachial plexus block   Pre-Anesthetic Checklist: ,, timeout performed, Correct Patient, Correct Site, Correct Laterality, Correct Procedure, Correct Position, site marked, Risks and benefits discussed,  Surgical consent,  Pre-op evaluation,  At surgeon's request and post-op pain management  Laterality: Left  Prep: chloraprep       Needles:  Injection technique: Single-shot  Needle Type: Echogenic Needle     Needle Length: 5cm  Needle Gauge: 21     Additional Needles:   Narrative:  Start time: 04/15/2020 7:56 AM End time: 04/15/2020 8:00 AM Injection made incrementally with aspirations every 5 mL.  Performed by: Personally  Anesthesiologist: Audry Pili, MD  Additional Notes: No pain on injection. No increased resistance to injection. Injection made in 5cc increments. Good needle visualization. Patient tolerated the procedure well.

## 2020-04-15 NOTE — Anesthesia Procedure Notes (Signed)
Procedure Name: Intubation Date/Time: 04/15/2020 8:37 AM Performed by: Montel Clock, CRNA Pre-anesthesia Checklist: Patient identified, Emergency Drugs available, Suction available, Patient being monitored and Timeout performed Patient Re-evaluated:Patient Re-evaluated prior to induction Oxygen Delivery Method: Circle system utilized Preoxygenation: Pre-oxygenation with 100% oxygen Induction Type: IV induction Ventilation: Mask ventilation without difficulty Laryngoscope Size: Mac and 3 Grade View: Grade II Tube type: Oral Tube size: 7.5 mm Number of attempts: 1 Airway Equipment and Method: Stylet Placement Confirmation: ETT inserted through vocal cords under direct vision,  positive ETCO2 and breath sounds checked- equal and bilateral Secured at: 23 cm Tube secured with: Tape Dental Injury: Teeth and Oropharynx as per pre-operative assessment  Comments: Grade 2a with downward laryngeal pressure. ETT easily passed.

## 2020-04-15 NOTE — Anesthesia Postprocedure Evaluation (Signed)
Anesthesia Post Note  Patient: Troy Willis  Procedure(s) Performed: LEFT SHOULDER ARTHROSCOPY DEBRIDEMENT WITH ROTATOR CUFF REPAIR AND SUBACROMIAL DECOMPRESSION BICEP TENODESIS (Left Shoulder)     Patient location during evaluation: PACU Anesthesia Type: General Level of consciousness: awake and alert Pain management: pain level controlled Vital Signs Assessment: post-procedure vital signs reviewed and stable Respiratory status: spontaneous breathing, nonlabored ventilation and respiratory function stable Cardiovascular status: blood pressure returned to baseline and stable Postop Assessment: no apparent nausea or vomiting Anesthetic complications: no   No complications documented.  Last Vitals:  Vitals:   04/15/20 1045 04/15/20 1050  BP: 136/88 134/87  Pulse: 62 61  Resp:  16  Temp: (!) 36.3 C 36.4 C  SpO2: 98% 96%    Last Pain:  Vitals:   04/15/20 1050  TempSrc:   PainSc: 0-No pain                 Audry Pili

## 2020-04-15 NOTE — Interval H&P Note (Signed)
All questions answered. Patient elected to proceed.

## 2020-04-15 NOTE — Op Note (Signed)
Orthopaedic Surgery Operative Note (CSN: [id number])  Troy Willis  April 18, 1946 Date of Surgery: 04/15/2020   Diagnoses:  Left shoulder rotator cuff tear, impingement and biceps tendinitis  Procedure: Arthroscopic extensive debridement Arthroscopic subacromial decompression Arthroscopic rotator cuff repair Arthroscopic biceps tenodesis   Operative Finding Exam under anesthesia: full motion, no limitation Articular space: No loose bodies, capsule intact, labrum frayed and torn in anterior inferior and superior intervals.  Debrided back Chondral surfaces:Intact, no sign of chondral degeneration on the glenoid or humeral head Biceps: significant tearing and fraying Subscapularis: mild fraying superiorly, no tearing away from attachment Superior Cuff:full thickness supraspinatus and infraspinatus tears with retraction Bursal side: as above, type 2 acromion converted to type 1.  Successful completion of the planned procedure.  Poor bone and tissue quality overall, anterior lateral anchor would not get purchase and switched to 6.75mm swivelock   Post-operative plan: The patient will be non-weightbearing in a sling for 6 weeks, therapy delayed till after 6 weeks.  The patient will be discharged home.  DVT prophylaxis not indicated in ambulatory upper extremity patient without known risk factors.   Pain control with PRN pain medication preferring oral medicines.  Follow up plan will be scheduled in approximately 7 days for incision check and XR.  Post-Op Diagnosis: Same Surgeons:Primary: Hiram Gash, MD Assistants:Caroline McBane PA-C Location: [facility] Anesthesia: General with Exparel interscalene block Antibiotics: Ancef 2 g Tourniquet time: None Estimated Blood Loss: Minimal Complications: None Specimens: None Implants: Implant Name Type Inv. Item Serial No. Manufacturer Lot No. LRB No. Used Action  ANCHOR SUT BIO SW 4.75X19.1 - [id number] Anchor ANCHOR SUT BIO SW  4.75X19.1  [employer] [id number] Left 1 Implanted  ANCHOR SUT BIO SW 4.75X19.1 - [id number] Anchor ANCHOR SUT BIO SW 4.75X19.1  [employer] [id number] Left 1 Implanted  ANCHOR SUT BIO SW 4.75X19.1 - [id number] Anchor ANCHOR SUT BIO SW 4.75X19.1  [employer] [id number] Left 1 Implanted  ANCHOR SUT 1.8 FIBERTAK 2 SUT - [id number] Anchor ANCHOR SUT 1.8 FIBERTAK 2 SUT  [employer] [id number] Left 1 Implanted  ANCHOR SUT BIO SW 4.75X19.1 - [id number] Anchor ANCHOR SUT BIO SW 4.75X19.1  [employer] [id number] Left 1 Implanted  ANCHOR SUT 1.8 FIBERTAK 2 SUT - [id number] Anchor ANCHOR SUT 1.8 FIBERTAK 2 SUT  [employer] [id number] Left 1 Implanted  ANCHOR SUT SWIVELLOK BIO - [id number] Anchor ANCHOR [employer] [id number] Left 1 Implanted    Indications for Surgery:   Troy Willis is a 74 y.o. male with continued shoulder pain refractory to nonoperative measures for extended period of time. He had a large cuff tear on MRI but had good function overall.  We felt he was indicated for repair.  The risks and benefits were explained at length including but not limited to continued pain, cuff failure, biceps tenodesis failure, stiffness, need for further surgery and infection.   Procedure:   Patient was correctly identified in the preoperative holding area and operative site marked.  Patient brought to OR and positioned beachchair on an [place] table ensuring that all bony prominences were padded and the head was in an appropriate location.  Anesthesia was induced and the operative shoulder was prepped and draped in the usual sterile fashion.  Timeout was called preincision.  A standard posterior viewing portal was made after localizing the portal with a spinal needle.  An anterior accessory portal was also made.  After clearing the articular space the camera was positioned in the  subacromial space.  Findings above.    Extensive debridement was performed of the anterior interval tissue, labral fraying and  the bursa.  Subacromial decompression: We made a lateral portal with spinal needle guidance. We then proceeded to debride bursal tissue extensively with a shaver and arthrocare device. At that point we continued to identify the borders of the acromion and identify the spur. We then carefully preserved the deltoid fascia and used a burr to convert the acromion to a Type 1 flat acromion without issue.  Biceps tenodesis: We marked the tendon and then performed a tenotomy and debridement of the stump in the articular space. We then identified the biceps tendon in its groove suprapec with the arthroscope in the lateral portal taking care to move from lateral to medial to avoid injury to the subscapularis. At that point we unroofed the tendon itself and mobilized it. An accessory anterior portal was made in line with the tendon and we grasped it from the anterior superior portal and worked from the accessory anterior portal. Two Fibertak 1.50mm knotless anchors were placed in the groove and the tendon was secured in a luggage loop style fashion with a pass of the limb of suture through the tendon using a scorpion device to avoid pull-through.  Repair was completed with good tension on the tendon.  Residual stump of the tendon was removed after being resected with a RF ablator.  Arthroscopic Rotator Cuff Repair: Tuberosity was prepared with a burr to a bleeding bed.  Following completion of the above we placed 2 4.7 Swivelock anchor loaded with a tape at inserted at the medial articular margin and an scorpion suture passing device, shuttled  sutures medially in a horizontal mattress suture configuration.  We then tied using arthroscopic knot tying techniques  each suture to its partner reducing the tendon at the prepared insertion site.  The fiber tape was not tied. With a medial row suture limbs then incorporated, 2 anteriorly and  2 posteriorly, into each of two 4.75 biocomposite SwiveLock anchors, each placed 8 to  10 mm below the tip of the tuberosity and spanning anterior-posterior width of the tear with care to avoid over tensioning. The anterior had poor to no purchase and was switched to a 6.50mm swivellock as a salvage option and this anchor has good purchase.  The incisions were closed with absorbable monocryl and steri strips.  A sterile dressing was placed along with a sling. The patient was awoken from general anesthesia and taken to the PACU in stable condition without complication.   Noemi Chapel, PA-C, present and scrubbed throughout the case, critical for completion in a timely fashion, and for retraction, instrumentation, closure.

## 2020-04-16 ENCOUNTER — Encounter (HOSPITAL_COMMUNITY): Payer: Self-pay | Admitting: Orthopaedic Surgery

## 2020-04-23 DIAGNOSIS — M75122 Complete rotator cuff tear or rupture of left shoulder, not specified as traumatic: Secondary | ICD-10-CM | POA: Diagnosis not present

## 2020-05-29 DIAGNOSIS — M75122 Complete rotator cuff tear or rupture of left shoulder, not specified as traumatic: Secondary | ICD-10-CM | POA: Diagnosis not present

## 2020-06-02 ENCOUNTER — Other Ambulatory Visit: Payer: Self-pay | Admitting: General Surgery

## 2020-06-02 DIAGNOSIS — M6281 Muscle weakness (generalized): Secondary | ICD-10-CM | POA: Diagnosis not present

## 2020-06-02 DIAGNOSIS — M25612 Stiffness of left shoulder, not elsewhere classified: Secondary | ICD-10-CM | POA: Diagnosis not present

## 2020-06-02 DIAGNOSIS — B079 Viral wart, unspecified: Secondary | ICD-10-CM | POA: Diagnosis not present

## 2020-06-02 DIAGNOSIS — M25512 Pain in left shoulder: Secondary | ICD-10-CM | POA: Diagnosis not present

## 2020-06-02 DIAGNOSIS — D485 Neoplasm of uncertain behavior of skin: Secondary | ICD-10-CM | POA: Diagnosis not present

## 2020-06-02 DIAGNOSIS — M75122 Complete rotator cuff tear or rupture of left shoulder, not specified as traumatic: Secondary | ICD-10-CM | POA: Diagnosis not present

## 2020-06-03 LAB — DERMATOPATHOLOGY REPORT

## 2020-06-03 LAB — DERMPATH, SPECIMEN A

## 2020-06-05 DIAGNOSIS — M7522 Bicipital tendinitis, left shoulder: Secondary | ICD-10-CM | POA: Diagnosis not present

## 2020-06-05 DIAGNOSIS — M25612 Stiffness of left shoulder, not elsewhere classified: Secondary | ICD-10-CM | POA: Diagnosis not present

## 2020-06-05 DIAGNOSIS — M6281 Muscle weakness (generalized): Secondary | ICD-10-CM | POA: Diagnosis not present

## 2020-06-05 DIAGNOSIS — M75122 Complete rotator cuff tear or rupture of left shoulder, not specified as traumatic: Secondary | ICD-10-CM | POA: Diagnosis not present

## 2020-06-09 DIAGNOSIS — M25612 Stiffness of left shoulder, not elsewhere classified: Secondary | ICD-10-CM | POA: Diagnosis not present

## 2020-06-09 DIAGNOSIS — M25512 Pain in left shoulder: Secondary | ICD-10-CM | POA: Diagnosis not present

## 2020-06-09 DIAGNOSIS — M75122 Complete rotator cuff tear or rupture of left shoulder, not specified as traumatic: Secondary | ICD-10-CM | POA: Diagnosis not present

## 2020-06-09 DIAGNOSIS — M6281 Muscle weakness (generalized): Secondary | ICD-10-CM | POA: Diagnosis not present

## 2020-06-11 DIAGNOSIS — M75122 Complete rotator cuff tear or rupture of left shoulder, not specified as traumatic: Secondary | ICD-10-CM | POA: Diagnosis not present

## 2020-06-11 DIAGNOSIS — M6281 Muscle weakness (generalized): Secondary | ICD-10-CM | POA: Diagnosis not present

## 2020-06-11 DIAGNOSIS — M25512 Pain in left shoulder: Secondary | ICD-10-CM | POA: Diagnosis not present

## 2020-06-11 DIAGNOSIS — M25612 Stiffness of left shoulder, not elsewhere classified: Secondary | ICD-10-CM | POA: Diagnosis not present

## 2020-06-15 DIAGNOSIS — M25612 Stiffness of left shoulder, not elsewhere classified: Secondary | ICD-10-CM | POA: Diagnosis not present

## 2020-06-15 DIAGNOSIS — M75122 Complete rotator cuff tear or rupture of left shoulder, not specified as traumatic: Secondary | ICD-10-CM | POA: Diagnosis not present

## 2020-06-15 DIAGNOSIS — M25512 Pain in left shoulder: Secondary | ICD-10-CM | POA: Diagnosis not present

## 2020-06-15 DIAGNOSIS — M6281 Muscle weakness (generalized): Secondary | ICD-10-CM | POA: Diagnosis not present

## 2020-06-18 DIAGNOSIS — M75122 Complete rotator cuff tear or rupture of left shoulder, not specified as traumatic: Secondary | ICD-10-CM | POA: Diagnosis not present

## 2020-06-18 DIAGNOSIS — M25512 Pain in left shoulder: Secondary | ICD-10-CM | POA: Diagnosis not present

## 2020-06-18 DIAGNOSIS — M6281 Muscle weakness (generalized): Secondary | ICD-10-CM | POA: Diagnosis not present

## 2020-06-18 DIAGNOSIS — M25612 Stiffness of left shoulder, not elsewhere classified: Secondary | ICD-10-CM | POA: Diagnosis not present

## 2020-06-22 DIAGNOSIS — M25512 Pain in left shoulder: Secondary | ICD-10-CM | POA: Diagnosis not present

## 2020-06-22 DIAGNOSIS — M25612 Stiffness of left shoulder, not elsewhere classified: Secondary | ICD-10-CM | POA: Diagnosis not present

## 2020-06-22 DIAGNOSIS — M6281 Muscle weakness (generalized): Secondary | ICD-10-CM | POA: Diagnosis not present

## 2020-06-22 DIAGNOSIS — M75122 Complete rotator cuff tear or rupture of left shoulder, not specified as traumatic: Secondary | ICD-10-CM | POA: Diagnosis not present

## 2020-06-26 DIAGNOSIS — M25612 Stiffness of left shoulder, not elsewhere classified: Secondary | ICD-10-CM | POA: Diagnosis not present

## 2020-06-26 DIAGNOSIS — M6281 Muscle weakness (generalized): Secondary | ICD-10-CM | POA: Diagnosis not present

## 2020-06-26 DIAGNOSIS — M75122 Complete rotator cuff tear or rupture of left shoulder, not specified as traumatic: Secondary | ICD-10-CM | POA: Diagnosis not present

## 2020-06-26 DIAGNOSIS — M25512 Pain in left shoulder: Secondary | ICD-10-CM | POA: Diagnosis not present

## 2020-06-30 DIAGNOSIS — M25512 Pain in left shoulder: Secondary | ICD-10-CM | POA: Diagnosis not present

## 2020-06-30 DIAGNOSIS — M6281 Muscle weakness (generalized): Secondary | ICD-10-CM | POA: Diagnosis not present

## 2020-06-30 DIAGNOSIS — M25612 Stiffness of left shoulder, not elsewhere classified: Secondary | ICD-10-CM | POA: Diagnosis not present

## 2020-06-30 DIAGNOSIS — M75122 Complete rotator cuff tear or rupture of left shoulder, not specified as traumatic: Secondary | ICD-10-CM | POA: Diagnosis not present

## 2020-07-03 DIAGNOSIS — M25512 Pain in left shoulder: Secondary | ICD-10-CM | POA: Diagnosis not present

## 2020-07-03 DIAGNOSIS — M75122 Complete rotator cuff tear or rupture of left shoulder, not specified as traumatic: Secondary | ICD-10-CM | POA: Diagnosis not present

## 2020-07-03 DIAGNOSIS — M25612 Stiffness of left shoulder, not elsewhere classified: Secondary | ICD-10-CM | POA: Diagnosis not present

## 2020-07-03 DIAGNOSIS — M7522 Bicipital tendinitis, left shoulder: Secondary | ICD-10-CM | POA: Diagnosis not present

## 2020-07-04 DIAGNOSIS — M542 Cervicalgia: Secondary | ICD-10-CM | POA: Diagnosis not present

## 2020-07-06 DIAGNOSIS — M75122 Complete rotator cuff tear or rupture of left shoulder, not specified as traumatic: Secondary | ICD-10-CM | POA: Diagnosis not present

## 2020-07-06 DIAGNOSIS — M25512 Pain in left shoulder: Secondary | ICD-10-CM | POA: Diagnosis not present

## 2020-07-06 DIAGNOSIS — M25612 Stiffness of left shoulder, not elsewhere classified: Secondary | ICD-10-CM | POA: Diagnosis not present

## 2020-07-06 DIAGNOSIS — M6281 Muscle weakness (generalized): Secondary | ICD-10-CM | POA: Diagnosis not present

## 2020-07-10 DIAGNOSIS — M25662 Stiffness of left knee, not elsewhere classified: Secondary | ICD-10-CM | POA: Diagnosis not present

## 2020-07-10 DIAGNOSIS — M6281 Muscle weakness (generalized): Secondary | ICD-10-CM | POA: Diagnosis not present

## 2020-07-10 DIAGNOSIS — M75122 Complete rotator cuff tear or rupture of left shoulder, not specified as traumatic: Secondary | ICD-10-CM | POA: Diagnosis not present

## 2020-07-10 DIAGNOSIS — M25612 Stiffness of left shoulder, not elsewhere classified: Secondary | ICD-10-CM | POA: Diagnosis not present

## 2020-07-13 DIAGNOSIS — M75122 Complete rotator cuff tear or rupture of left shoulder, not specified as traumatic: Secondary | ICD-10-CM | POA: Diagnosis not present

## 2020-07-13 DIAGNOSIS — M25512 Pain in left shoulder: Secondary | ICD-10-CM | POA: Diagnosis not present

## 2020-07-13 DIAGNOSIS — M25612 Stiffness of left shoulder, not elsewhere classified: Secondary | ICD-10-CM | POA: Diagnosis not present

## 2020-07-13 DIAGNOSIS — M6281 Muscle weakness (generalized): Secondary | ICD-10-CM | POA: Diagnosis not present

## 2020-07-17 DIAGNOSIS — M25512 Pain in left shoulder: Secondary | ICD-10-CM | POA: Diagnosis not present

## 2020-07-17 DIAGNOSIS — M25612 Stiffness of left shoulder, not elsewhere classified: Secondary | ICD-10-CM | POA: Diagnosis not present

## 2020-07-17 DIAGNOSIS — M6281 Muscle weakness (generalized): Secondary | ICD-10-CM | POA: Diagnosis not present

## 2020-07-17 DIAGNOSIS — M75122 Complete rotator cuff tear or rupture of left shoulder, not specified as traumatic: Secondary | ICD-10-CM | POA: Diagnosis not present

## 2020-07-20 DIAGNOSIS — M6281 Muscle weakness (generalized): Secondary | ICD-10-CM | POA: Diagnosis not present

## 2020-07-20 DIAGNOSIS — M25612 Stiffness of left shoulder, not elsewhere classified: Secondary | ICD-10-CM | POA: Diagnosis not present

## 2020-07-20 DIAGNOSIS — M25512 Pain in left shoulder: Secondary | ICD-10-CM | POA: Diagnosis not present

## 2020-07-20 DIAGNOSIS — M75122 Complete rotator cuff tear or rupture of left shoulder, not specified as traumatic: Secondary | ICD-10-CM | POA: Diagnosis not present

## 2020-07-24 DIAGNOSIS — M6281 Muscle weakness (generalized): Secondary | ICD-10-CM | POA: Diagnosis not present

## 2020-07-24 DIAGNOSIS — M25612 Stiffness of left shoulder, not elsewhere classified: Secondary | ICD-10-CM | POA: Diagnosis not present

## 2020-07-24 DIAGNOSIS — M75122 Complete rotator cuff tear or rupture of left shoulder, not specified as traumatic: Secondary | ICD-10-CM | POA: Diagnosis not present

## 2020-07-24 DIAGNOSIS — M25512 Pain in left shoulder: Secondary | ICD-10-CM | POA: Diagnosis not present

## 2020-07-27 DIAGNOSIS — M25512 Pain in left shoulder: Secondary | ICD-10-CM | POA: Diagnosis not present

## 2020-07-27 DIAGNOSIS — M25612 Stiffness of left shoulder, not elsewhere classified: Secondary | ICD-10-CM | POA: Diagnosis not present

## 2020-07-27 DIAGNOSIS — M6281 Muscle weakness (generalized): Secondary | ICD-10-CM | POA: Diagnosis not present

## 2020-07-27 DIAGNOSIS — M75122 Complete rotator cuff tear or rupture of left shoulder, not specified as traumatic: Secondary | ICD-10-CM | POA: Diagnosis not present

## 2020-07-30 ENCOUNTER — Encounter: Payer: Self-pay | Admitting: Family Medicine

## 2020-07-31 DIAGNOSIS — M25612 Stiffness of left shoulder, not elsewhere classified: Secondary | ICD-10-CM | POA: Diagnosis not present

## 2020-07-31 DIAGNOSIS — M75122 Complete rotator cuff tear or rupture of left shoulder, not specified as traumatic: Secondary | ICD-10-CM | POA: Diagnosis not present

## 2020-07-31 DIAGNOSIS — M6281 Muscle weakness (generalized): Secondary | ICD-10-CM | POA: Diagnosis not present

## 2020-07-31 DIAGNOSIS — M25512 Pain in left shoulder: Secondary | ICD-10-CM | POA: Diagnosis not present

## 2020-07-31 NOTE — Telephone Encounter (Signed)
Sorry to hear that he is not doing well.  He needs an office visit for further evaluation and treatment options  [name] has openings on Monday and could be seen then.  This seems to have been going on for the last 2 to 3 weeks.  If patient gets worse over the weekend please seek emergency room care  Please contact office for sooner follow up if symptoms do not improve or worsen or seek emergency care

## 2020-07-31 NOTE — Telephone Encounter (Signed)
Tammy, Please see patient comment regarding bright red blood in sputum.  Thank you.

## 2020-08-02 ENCOUNTER — Encounter (HOSPITAL_COMMUNITY): Payer: Self-pay | Admitting: Emergency Medicine

## 2020-08-02 ENCOUNTER — Other Ambulatory Visit: Payer: Self-pay

## 2020-08-02 ENCOUNTER — Emergency Department (HOSPITAL_COMMUNITY): Payer: PPO

## 2020-08-02 ENCOUNTER — Emergency Department (HOSPITAL_COMMUNITY)
Admission: EM | Admit: 2020-08-02 | Discharge: 2020-08-02 | Disposition: A | Discharge status: Home / Self Care | Payer: PPO | Attending: Emergency Medicine | Admitting: Emergency Medicine

## 2020-08-02 DIAGNOSIS — Z79899 Other long term (current) drug therapy: Secondary | ICD-10-CM | POA: Diagnosis not present

## 2020-08-02 DIAGNOSIS — R1110 Vomiting, unspecified: Secondary | ICD-10-CM | POA: Diagnosis not present

## 2020-08-02 DIAGNOSIS — N135 Crossing vessel and stricture of ureter without hydronephrosis: Secondary | ICD-10-CM | POA: Diagnosis not present

## 2020-08-02 DIAGNOSIS — I272 Pulmonary hypertension, unspecified: Secondary | ICD-10-CM | POA: Diagnosis not present

## 2020-08-02 DIAGNOSIS — I2510 Atherosclerotic heart disease of native coronary artery without angina pectoris: Secondary | ICD-10-CM | POA: Insufficient documentation

## 2020-08-02 DIAGNOSIS — R9431 Abnormal electrocardiogram [ECG] [EKG]: Secondary | ICD-10-CM | POA: Diagnosis not present

## 2020-08-02 DIAGNOSIS — R109 Unspecified abdominal pain: Secondary | ICD-10-CM | POA: Diagnosis not present

## 2020-08-02 DIAGNOSIS — N200 Calculus of kidney: Secondary | ICD-10-CM | POA: Insufficient documentation

## 2020-08-02 DIAGNOSIS — Z87891 Personal history of nicotine dependence: Secondary | ICD-10-CM | POA: Diagnosis not present

## 2020-08-02 DIAGNOSIS — R1032 Left lower quadrant pain: Secondary | ICD-10-CM | POA: Diagnosis not present

## 2020-08-02 DIAGNOSIS — I4891 Unspecified atrial fibrillation: Secondary | ICD-10-CM | POA: Insufficient documentation

## 2020-08-02 DIAGNOSIS — Z7901 Long term (current) use of anticoagulants: Secondary | ICD-10-CM | POA: Insufficient documentation

## 2020-08-02 DIAGNOSIS — Z85828 Personal history of other malignant neoplasm of skin: Secondary | ICD-10-CM | POA: Diagnosis not present

## 2020-08-02 DIAGNOSIS — Z8673 Personal history of transient ischemic attack (TIA), and cerebral infarction without residual deficits: Secondary | ICD-10-CM | POA: Diagnosis not present

## 2020-08-02 DIAGNOSIS — N132 Hydronephrosis with renal and ureteral calculous obstruction: Secondary | ICD-10-CM | POA: Diagnosis not present

## 2020-08-02 DIAGNOSIS — N3289 Other specified disorders of bladder: Secondary | ICD-10-CM | POA: Diagnosis not present

## 2020-08-02 DIAGNOSIS — N23 Unspecified renal colic: Secondary | ICD-10-CM | POA: Diagnosis not present

## 2020-08-02 LAB — CBC
HCT: 40 % (ref 39.0–52.0)
Hemoglobin: 12.6 g/dL — ABNORMAL LOW (ref 13.0–17.0)
MCH: 35.4 pg — ABNORMAL HIGH (ref 26.0–34.0)
MCHC: 31.5 g/dL (ref 30.0–36.0)
MCV: 112.4 fL — ABNORMAL HIGH (ref 80.0–100.0)
Platelets: 235 10*3/uL (ref 150–400)
RBC: 3.56 MIL/uL — ABNORMAL LOW (ref 4.22–5.81)
RDW: 12.9 % (ref 11.5–15.5)
WBC: 9.2 10*3/uL (ref 4.0–10.5)
nRBC: 0 % (ref 0.0–0.2)

## 2020-08-02 LAB — URINALYSIS, ROUTINE W REFLEX MICROSCOPIC
Bacteria, UA: NONE SEEN
Bilirubin Urine: NEGATIVE
Glucose, UA: NEGATIVE mg/dL
Ketones, ur: NEGATIVE mg/dL
Leukocytes,Ua: NEGATIVE
Nitrite: NEGATIVE
Protein, ur: NEGATIVE mg/dL
Specific Gravity, Urine: 1.02 (ref 1.005–1.030)
pH: 5 (ref 5.0–8.0)

## 2020-08-02 LAB — LIPASE, BLOOD: Lipase: 40 U/L (ref 11–51)

## 2020-08-02 LAB — COMPREHENSIVE METABOLIC PANEL
ALT: 32 U/L (ref 0–44)
AST: 36 U/L (ref 15–41)
Albumin: 3.6 g/dL (ref 3.5–5.0)
Alkaline Phosphatase: 69 U/L (ref 38–126)
Anion gap: 8 (ref 5–15)
BUN: 17 mg/dL (ref 8–23)
CO2: 24 mmol/L (ref 22–32)
Calcium: 9.2 mg/dL (ref 8.9–10.3)
Chloride: 99 mmol/L (ref 98–111)
Creatinine, Ser: 0.83 mg/dL (ref 0.61–1.24)
GFR, Estimated: >60 mL/min (ref >60)
Glucose, Bld: 124 mg/dL — ABNORMAL HIGH (ref 70–99)
Potassium: 4.1 mmol/L (ref 3.5–5.1)
Sodium: 131 mmol/L — ABNORMAL LOW (ref 135–145)
Total Bilirubin: 1.4 mg/dL — ABNORMAL HIGH (ref 0.3–1.2)
Total Protein: 7.5 g/dL (ref 6.5–8.1)

## 2020-08-02 LAB — TROPONIN I (HIGH SENSITIVITY)
Troponin I (High Sensitivity): 11 ng/L (ref <18)
Troponin I (High Sensitivity): 12 ng/L (ref <18)

## 2020-08-02 MED ORDER — MORPHINE SULFATE (PF) 4 MG/ML IV SOLN
4.0000 mg | Freq: Once | INTRAVENOUS | Status: AC
  Administered 2020-08-02: 4 mg via INTRAVENOUS
  Filled 2020-08-02: qty 1

## 2020-08-02 MED ORDER — OXYCODONE-ACETAMINOPHEN 5-325 MG PO TABS: 1.0000 | ORAL_TABLET | Freq: Four times a day (QID) | ORAL | 0 refills | Status: DC | PRN

## 2020-08-02 MED ORDER — SODIUM CHLORIDE 0.9 % IV SOLN: Freq: Once | INTRAVENOUS | Status: AC

## 2020-08-02 MED ORDER — IOHEXOL 300 MG/ML  SOLN
100.0000 mL | Freq: Once | INTRAMUSCULAR | Status: AC | PRN
  Administered 2020-08-02: 100 mL via INTRAVENOUS

## 2020-08-02 NOTE — ED Notes (Signed)
Patient transported to CT 

## 2020-08-02 NOTE — ED Provider Notes (Signed)
Physical Exam  BP (!) 158/114    Pulse 84    Temp 97.8 F (36.6 C) (Oral)    Resp 18    SpO2 100%   Physical Exam Vitals and nursing note reviewed.  Constitutional:      General: He is not in acute distress.    Appearance: He is not ill-appearing, toxic-appearing or diaphoretic.  HENT:     Head: Normocephalic.  Eyes:     General: No scleral icterus.       Right eye: No discharge.        Left eye: No discharge.  Cardiovascular:     Rate and Rhythm: Normal rate.  Pulmonary:     Effort: Pulmonary effort is normal. No tachypnea or respiratory distress.     Breath sounds: Normal breath sounds. No stridor.  Abdominal:     General: Abdomen is flat. Bowel sounds are normal. There is no distension. There are no signs of injury.     Palpations: Abdomen is soft. There is no mass or pulsatile mass.     Tenderness: There is no abdominal tenderness. There is left CVA tenderness. There is no right CVA tenderness, guarding or rebound.     Hernia: There is no hernia in the umbilical area or ventral area.  Skin:    General: Skin is warm and dry.  Neurological:     General: No focal deficit present.     Mental Status: He is alert.  Psychiatric:        Behavior: Behavior is cooperative.     ED Course/Procedures     Procedures  MDM   Care of patient assumed from PA [place] at 1600.  Agree with history, physical exam and plan.  See their note for further details. Briefly,   Patient report since midnight of last night he has had persistent pain to his abdomen.  Pain is localized to his left lower quadrant radiates towards his left flank.  Difficult to describe the pain but described more as a pressure sensation has been waxing waning but have been persistent.  He does not seem to find any relief.  Despite having bowel movement.  He feels as if he is unable to fully urinate but denies any burning sensation while urinating or any hematuria.  He notes mild nausea but denies any vomiting diarrhea or  constipation.  No fever or chills no chest pain shortness of breath or productive cough.  Remote history of diverticulosis.  No history of kidney stone.  No abnormal bleeding.  He has been fully vaccinated for COVID-19.  CT scan showed delayed left nephrogram with mild fullness of the left collecting system impression soft tissue density in the left UVJ.    Previous provider contacted on-call urology Dr. Jeffie Pollock who will evaluate CT and give recommendations.  Urinalysis shows no signs of infection; due to patient's forms of left collecting system we will send urine for culture.  Discussed findings of CT scan with with patient.    Bladder scan showed 71 mL in the bladder.  Resting comfortably with no complaints of pain.  Second round of morphine.  Updated patient's daughter at request of patient.  Dr. Jeffie Pollock contacted attending physician Dr. Vallery Ridge and will see the patient for evaluation.  Patient will need cystoscopy however may be delayed due to patient's Xarelto use.  1800 spoke with Dr. Jeffie Pollock who advised that patient is stable for discharge at this time with close urology follow-up.    Will prescribe patient with  3 days Percocet to help with his pain control.     Loni Beckwith, PA-C 08/02/20 1825    Charlesetta Shanks, MD 08/07/20 [phone]

## 2020-08-02 NOTE — Discharge Instructions (Addendum)
You came to the emerge department today to be evaluated for your left lower quadrant abdominal pain.  Your CT scan showed that there is a soft tissue mass at your left ureterovesicular junction.  You were seen by the urologist Dr. Jeffie Pollock here in the emergency department.  You will need to follow-up with his office for follow-up treatment.  I have given you a prescription for Percocet to help manage your pain.  You take 1 pill as needed every 6 hours for severe pain.  Today you received medications that may make you sleepy or impair your ability to make decisions.  For the next 24 hours please do not drive, operate heavy machinery, care for a small child with out another adult present, or perform any activities that may cause harm to you or someone else if you were to fall asleep or be impaired.   You are being prescribed a medication which may make you sleepy. Please follow up of listed precautions for at least 24 hours after taking one dose.  Please return if your pain not managed by, you are unable to urinate, develop fever, you develop any new or concerning symptoms.  EXAM: CT ABDOMEN AND PELVIS WITH CONTRAST   TECHNIQUE: Multidetector CT imaging of the abdomen and pelvis was performed using the standard protocol following bolus administration of intravenous contrast.   CONTRAST:  188mL OMNIPAQUE IOHEXOL 300 MG/ML  SOLN   COMPARISON:  CT chest 10/21/2019   FINDINGS: Lower chest: Similar-appearing right middle lobe cystic changes that are partially visualized. Interval development of a nodular-like 5 mm subpleural subsolid density (5:2). Linear atelectasis versus scarring within bilateral lower lobes.   Hepatobiliary: No focal liver abnormality. No gallstones, gallbladder wall thickening, or pericholecystic fluid. No biliary dilatation.   Pancreas: No focal lesion. Normal pancreatic contour. No surrounding inflammatory changes. No main pancreatic ductal dilatation.   Spleen: Normal  in size without focal abnormality.   Adrenals/Urinary Tract: No adrenal nodule bilaterally. Delayed left nephrogram and left perinephric stranding. Couple of punctate calcified stones within the left kidney are noted. No right nephrolithiasis. Fullness of the left collecting system with no frank hydronephrosis. Mild left hydroureter. Question soft tissue density at the left ureterovesicular junction (7:66, 6:43). No right hydronephrosis. No right hydroureter. No ureterolithiasis bilaterally. The urinary bladder is unremarkable.   Stomach/Bowel: Stomach is within normal limits. No evidence of bowel wall thickening or dilatation. The appendix not definitely identified.   Vascular/Lymphatic: No abdominal aorta or iliac aneurysm. Moderate calcified and noncalcified atherosclerotic plaque of the aorta and its branches. No abdominal, pelvic, or inguinal lymphadenopathy.   Reproductive: Prostate is unremarkable.   Other: No intraperitoneal free fluid. No intraperitoneal free gas. No organized fluid collection.   Musculoskeletal:   No abdominal wall hernia or abnormality.   No suspicious lytic or blastic osseous lesions. No acute displaced fracture. Interval disc space vacuum phenomenon. Retrolisthesis of L5 on S1 with no severe osseous central canal stenosis.   IMPRESSION: 1. Delayed left nephrogram with mild fullness of the left collecting system and question of soft tissue density lesion at the left ureterovesicular junction. Recommend urologic consultation. 2. Nonobstructive punctate left nephrolithiasis. 3. Interval development of a nodular-like 5 mm subpleural subsolid density. No follow-up recommended. This recommendation follows the consensus statement: Guidelines for Management of Incidental Pulmonary Nodules Detected on CT Images: From the Fleischner Society 2017; Radiology 2017; 284:228-243. 4.  Aortic Atherosclerosis (ICD10-I70.0).
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
## 2020-08-02 NOTE — ED Notes (Signed)
Bladder scan completed. Scanned twice first was 86 ml and second was 66ml, PA notified

## 2020-08-02 NOTE — ED Provider Notes (Signed)
I provided a substantive portion of the care of this patient.  I personally performed the entirety of the history for this encounter.  EKG Interpretation  Date/Time:  Sunday August 02 2020 12:11:31 EDT Ventricular Rate:  117 PR Interval:    QRS Duration: 88 QT Interval:  341 QTC Calculation: 476 R Axis:   75 Text Interpretation: Atrial fibrillation RSR' in V1 or V2, probably normal variant Minimal ST depression, inferior leads Borderline prolonged QT interval rate increzsed from previous, otherwise unchanged Confirmed by Charlesetta Shanks [phone]) on 08/02/2020 12:40:47 PM Reports he went to bed feeling well last night.  He awakened at about 2 AM with sudden severe pain in his left flank and lower abdomen.  He reports it was a intense pressure sensation.  Reports at that time it was difficult to urinate.  After period of pain, he felt generally weak.  There is no weakness numbness or isolated dysfunction of the lower extremity.  Patient reports since getting morphine his pain is much improved.  Alert and nontoxic.  Mental status clear.  Heart regular no rub or gallop.  Lungs are clear without wheeze rhonchi rale.  No CVA tenderness.  Abdomen soft with mild left lower quadrant and suprapubic discomfort but no guarding.  No mass.  Lower extremities normal with extensive varicosities but no significant peripheral edema.  No wounds.  Feet are warm and dry.  Distal pulses 2+ and symmetric  Agree with Plan and management.     Charlesetta Shanks, MD 08/02/20 1348

## 2020-08-02 NOTE — ED Notes (Signed)
ED Provider at bedside, [place], [place]

## 2020-08-02 NOTE — ED Notes (Signed)
Had episode of increased dysrhythmia with clamminess and anxiety, troponin ordered and PA notified

## 2020-08-02 NOTE — ED Triage Notes (Signed)
Pt reports LLQ pain since midnight that now radiates to L flank.  Decreased urination and nausea.  Denies vomiting and diarrhea.

## 2020-08-02 NOTE — ED Notes (Signed)
EDT, Troy Willis in to do bladder scan
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
## 2020-08-02 NOTE — ED Notes (Addendum)
No change from triage, "Pt reports LLQ pain since midnight that now radiates to L flank.  Decreased urination and nausea.  Denies vomiting and diarrhea." States no acute pain, just feeling bad with left flank pain, left mid quad abd pain with radiation to scrotum. States he feels like he needs to void but is not able, also has pressure like he needs to have a bowel movement

## 2020-08-02 NOTE — ED Notes (Signed)
ED Provider at bedside. 

## 2020-08-03 ENCOUNTER — Encounter (HOSPITAL_COMMUNITY): Payer: Self-pay

## 2020-08-03 ENCOUNTER — Other Ambulatory Visit: Payer: Self-pay

## 2020-08-03 ENCOUNTER — Encounter (HOSPITAL_COMMUNITY): Payer: Self-pay | Admitting: Urology

## 2020-08-03 ENCOUNTER — Telehealth: Payer: Self-pay | Admitting: Cardiology

## 2020-08-03 ENCOUNTER — Inpatient Hospital Stay (HOSPITAL_COMMUNITY)
Admission: RE | Admit: 2020-08-03 | Discharge: 2020-08-03 | Disposition: A | Discharge status: Home / Self Care | Payer: PPO | Source: Ambulatory Visit

## 2020-08-03 ENCOUNTER — Other Ambulatory Visit: Payer: Self-pay | Admitting: Urology

## 2020-08-03 ENCOUNTER — Telehealth: Payer: Self-pay

## 2020-08-03 MED ORDER — GEMCITABINE CHEMO FOR BLADDER INSTILLATION 2000 MG: 2000.0000 mg | Freq: Once | INTRAVENOUS | Status: AC

## 2020-08-03 NOTE — Telephone Encounter (Signed)
   Primary Cardiologist: Fransico Him, MD  Chart reviewed as part of pre-operative protocol coverage. Last cardiology OV 03/2020. Seen in ED yesterday for flank pain, bloating, inability to have a BM. CT scan showed delayed left nephrogram with mild fullness of the left collecting system impression soft tissue density in the left UVJ. Urology has requested to proceed with procedure below this week. Got in touch with patient through daughter's number - he was there with her. The patient affirms he has been doing well without any new cardiac symptoms. Still able to walk for several miles (and achieve over 4 METS) without any new cardiac limitations or angina - states he did so the other day and "felt great." RCRI 0.9% indicating low risk of CV complications. Given clinical stability, therefore, based on ACC/AHA guidelines, the patient would be at acceptable risk for the planned procedure without further cardiovascular testing. The patient was advised that if he develops new symptoms prior to surgery to contact our office to arrange for a follow-up visit, and he verbalized understanding.  Per office protocol, patient can hold Xarelto for 3 days prior to procedure. I did relay this recommendation to the patient since his surgery is scheduled for 3/25.  I will route this recommendation to the requesting party via Epic fax function and remove from pre-op pool. Please call with questions.  I will also route this message to Dr. Theodosia Blender nurse Carly to assist with previously suggested echo. 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 Dr. Radford Pax had recommended f/u echo in 1 year (April 2022). Given the nature of the urology issue and fact that he is asymptomatic from cardiac standpoint, OK to schedule this in April 2022 as planned - I just don't see it's been scheduled yet so will route to Carly to assist. Thank you Carly!  Charlie Pitter,  PA-C 08/03/2020, 3:27 PM

## 2020-08-03 NOTE — Progress Notes (Signed)
DUE TO COVID-19 ONLY ONE VISITOR IS ALLOWED TO COME WITH YOU AND STAY IN THE WAITING ROOM ONLY DURING PRE OP AND PROCEDURE DAY OF SURGERY. THE 1 VISITOR  MAY VISIT WITH YOU AFTER SURGERY IN YOUR PRIVATE ROOM DURING VISITING HOURS ONLY!  YOU NEED TO HAVE A COVID 19 TEST ON__3/22/2022 _____ @_______ , THIS TEST MUST BE DONE BEFORE SURGERY,  COVID TESTING SITE [address] [place] [address], IT IS ON THE RIGHT GOING OUT [address] APPROXIMATELY  2 MINUTES PAST [employer] ON THE RIGHT. ONCE YOUR COVID TEST IS COMPLETED,  PLEASE BEGIN THE QUARANTINE INSTRUCTIONS AS OUTLINED IN YOUR HANDOUT.                Avon Mergenthaler  08/03/2020   Your procedure is scheduled on: 08/07/2020    Report to [facility] Main  Entrance   Report to admitting at     1130 AM     Call this number if you have problems the morning of surgery [phone]    Remember: Do not eat food , candy gum or mints :After Midnight. You may have clear liquids from midnight until 1030am     CLEAR LIQUID DIET   Foods Allowed                                                                       Coffee and tea, regular and decaf                              Plain Jell-O any favor except red or purple                                            Fruit ices (not with fruit pulp)                                      Iced Popsicles                                     Carbonated beverages, regular and diet                                    Cranberry, grape and apple juices Sports drinks like Gatorade Lightly seasoned clear broth or consume(fat free) Sugar, honey syrup   _____________________________________________________________________    BRUSH YOUR TEETH MORNING OF SURGERY AND RINSE YOUR MOUTH OUT, NO CHEWING GUM CANDY OR MINTS.     Take these medicines the morning of surgery with A SIP OF WATER: none   DO NOT TAKE ANY DIABETIC MEDICATIONS DAY OF YOUR SURGERY                                You may not have any metal on  your body including hair pins and              piercings  Do not wear jewelry, make-up, lotions, powders or perfumes, deodorant             Do not wear nail polish on your fingernails.  Do not shave  48 hours prior to surgery.              Men may shave face and neck.   Do not bring valuables to the hospital. [place].  Contacts, dentures or bridgework may not be worn into surgery.  Leave suitcase in the car. After surgery it may be brought to your room.     Patients discharged the day of surgery will not be allowed to drive home. IF YOU ARE HAVING SURGERY AND GOING HOME THE SAME DAY, YOU MUST HAVE AN ADULT TO DRIVE YOU HOME AND BE WITH YOU FOR 24 HOURS. YOU MAY GO HOME BY TAXI OR UBER OR ORTHERWISE, BUT AN ADULT MUST ACCOMPANY YOU HOME AND STAY WITH YOU FOR 24 HOURS.  Name and phone number of your driver:  Special Instructions: N/A              Please read over the following fact sheets you were given: _____________________________________________________________________  [facility] - Preparing for Surgery Before surgery, you can play an important role.  Because skin is not sterile, your skin needs to be as free of germs as possible.  You can reduce the number of germs on your skin by washing with CHG (chlorahexidine gluconate) soap before surgery.  CHG is an antiseptic cleaner which kills germs and bonds with the skin to continue killing germs even after washing. Please DO NOT use if you have an allergy to CHG or antibacterial soaps.  If your skin becomes reddened/irritated stop using the CHG and inform your nurse when you arrive at Short Stay. Do not shave (including legs and underarms) for at least 48 hours prior to the first CHG shower.  You may shave your face/neck. Please follow these instructions carefully:  1.  Shower with CHG Soap the night before surgery and the  morning of Surgery.  2.  If you  choose to wash your hair, wash your hair first as usual with your  normal  shampoo.  3.  After you shampoo, rinse your hair and body thoroughly to remove the  shampoo.                           4.  Use CHG as you would any other liquid soap.  You can apply chg directly  to the skin and wash                       Gently with a scrungie or clean washcloth.  5.  Apply the CHG Soap to your body ONLY FROM THE NECK DOWN.   Do not use on face/ open                           Wound or open sores. Avoid contact with eyes, ears mouth and genitals (private parts).                       Wash face,  Genitals (  private parts) with your normal soap.             6.  Wash thoroughly, paying special attention to the area where your surgery  will be performed.  7.  Thoroughly rinse your body with warm water from the neck down.  8.  DO NOT shower/wash with your normal soap after using and rinsing off  the CHG Soap.                9.  Pat yourself dry with a clean towel.            10.  Wear clean pajamas.            11.  Place clean sheets on your bed the night of your first shower and do not  sleep with pets. Day of Surgery : Do not apply any lotions/deodorants the morning of surgery.  Please wear clean clothes to the hospital/surgery center.  FAILURE TO FOLLOW THESE INSTRUCTIONS MAY RESULT IN THE CANCELLATION OF YOUR SURGERY PATIENT SIGNATURE_________________________________  NURSE SIGNATURE__________________________________  ________________________________________________________________________

## 2020-08-03 NOTE — Telephone Encounter (Signed)
   Primary Cardiologist: Fransico Him, MD  Chart reviewed as part of pre-operative protocol coverage. Patient was contacted 08/03/2020 in reference to pre-operative risk assessment for pending surgery as outlined below.  Rondo Spittler was last seen on 03/26/20 by Dr. Radford Pax, note reviewed, felt to be clinically stable. Reached out to patient to call back to ensure no new symptoms. Got VM, LMTCB.  Fairly urgent answer needed - will route to pharm for input on holding Xarelto.   Charlie Pitter, PA-C 08/03/2020, 12:09 PM

## 2020-08-03 NOTE — Progress Notes (Signed)
Completed preop appt via phone  . Pt was in ED on 08/02/2020 with left flank pain.  CBC and CMP done on 08/02/2020 along with EKG .  Daughter and pt reviewed medical history and preop instructions via phone.  Pt and daughter voiced understanding.  Covid appt scheduled.  Anesthesia Review:  PCP: DR Annye Asa  Cardiologist : DR Fransico Him  LOV- 03/26/20.  Clearance in telephone encounter dated 08/03/20.  Pulmonary- Maggie Schwalbe- 03/05/20   Chest x-ray : CT Chest- 10/26/19 EKG : 08/02/20  Echo : 08/26/19  Stress test: 2019  Cardiac Cath : 2017  Activity level:  Can do a flight of stairs without difficulty  Sleep Study/ CPAP : no  Fasting Blood Sugar :      / Checks Blood Sugar -- times a day:   Blood Thinner/ Instructions /Last Dose: ASA / Instructions/ Last Dose :  Xarelto- PT and daughter state that there will be a conversastion this pm on 08/03/20 from MD regarding Xarelto instructions.

## 2020-08-03 NOTE — Progress Notes (Signed)
Anesthesia Review:  PCP: Dr [name]  Cardiologist : Chest x-ray : DR Fransico Him- clearance in 08/02/2020 telephone encounter  [place] 03/26/20  EKG :08/02/2020  CT cgest- 10/2019  Echo :08/26/2019  Stress test: 2019  Cardiac Cath : 2017  Activity level:  Sleep Study/ CPAP : no  Fasting Blood Sugar :      / Checks Blood Sugar -- times a day:   Blood Thinner/ Instructions /Last Dose: ASA / Instructions/ Last Dose :   In ED 08/02/2020 for left flank pain

## 2020-08-03 NOTE — Telephone Encounter (Signed)
Patient was seen in ED.   Nurse Assessment Nurse: Gilford Rile, RN, Ginny Date/Time (Eastern Time): 08/02/2020 10:10:11 AM Confirm and document reason for call. If symptomatic, describe symptoms. ---Caller stating last night he woke with some lower left abdominal pain (felt like gas pain). Had urge to urinate and couldn't urinate, also felt need for bowel movment and unable. Stating has continued with bloating, dull ache LLQ abdomen which continues, but not as intense. Rating pain 7/10 this morning. Did urinate at 0800, but not a large amount. Also had nausea times two. Stating last bowel movement was this morning, small amount. Did not relieve pressure. Does the patient have any new or worsening symptoms? ---Yes Will a triage be completed? ---Yes Related visit to physician within the last 2 weeks? ---No Does the PT have any chronic conditions? (i.e. diabetes, asthma, this includes High risk factors for pregnancy, etc.) ---Yes List chronic conditions. ---Chronic lung disease, bronchiectasis, HLD, atrial fib (on Eliquis) Is this a behavioral health or substance abuse call? ---No Guidelines Guideline Title Affirmed Question Affirmed Notes Nurse Date/Time (Eastern Time) Abdominal Pain - Male [1] MILD-MODERATE pain AND [2] constant AND [3] present > 2 hours Gilford Rile, Therapist, sports, Ginny 08/02/2020 10:14:13 AM PLEASE NOTE: All timestamps contained within this report are represented as [place] Standard Time. CONFIDENTIALTY NOTICE: This fax transmission is intended only for the addressee. It contains information that is legally privileged, confidential or otherwise protected from use or disclosure. If you are not the intended recipient, you are strictly prohibited from reviewing, disclosing, copying using or disseminating any of this information or taking any action in reliance on or regarding this information. If you have received this fax in error, please notify us immediately by telephone so that we can  arrange for its return to [place]. Phone: [phone], Toll-Free: [phone], Fax: [phone] Page: 2 of 2 Call Id: [id number] [place]. Time Eilene Ghazi Time) Disposition Final User 08/02/2020 10:09:08 AM Send to Urgent Queue Shann Medal 08/02/2020 10:19:00 AM See HCP within 4 Hours (or PCP triage) Yes Gilford Rile, RN, Donia Guiles Caller Disagree/Comply Comply Caller Understands Yes PreDisposition Call Doctor Care Advice Given Per Guideline SEE HCP (OR PCP TRIAGE) WITHIN 4 HOURS: * IF OFFICE WILL BE CLOSED AND NO PCP (PRIMARY CARE PROVIDER) SECOND-LEVEL TRIAGE: You need to be seen within the next 3 or 4 hours. A nearby Urgent Care Center [facility]) is often a good source of care. Another choice is to go to the ED. Go sooner if you become worse. NOTHING BY MOUTH: * Do not eat or drink anything for now. CALL BACK IF: * You become worse CARE ADVICE given per Abdominal Pain, Male (Adult) guideline. Referrals [place]

## 2020-08-03 NOTE — Telephone Encounter (Signed)
Patient's daughter Vincent Gros [phone]) called returning phone call for her father clearance. Please advise

## 2020-08-03 NOTE — Telephone Encounter (Signed)
Called and spoke with patient about recent visit to to ED at [facility]. Patient had lower left ab pain and needed to have a bm and could not. Pain got worse so he was insrtucted by 1 of our staff that he should be seen by ED. The visit to the Ed resulted in a small mass on left kidney. Patient will have a ultrascopic procedure on 08/07/20 to remove the mass and will follow up with Dr Sedalia Muta on 08/24/20. Do you need to have a follow up with patient as well? Please advise. Patient also stated that he is doing ok! Daughter is at home with him helping him at this time.

## 2020-08-03 NOTE — Telephone Encounter (Addendum)
Patient with diagnosis of A Fib on Xarelto for anticoagulation.    1. Procedure: transurethral resection of bladder tumor  Date of procedure: 08/07/20   CHA2DS2-VASc Score = 4  This indicates a 4.8% annual risk of stroke. The patient's score is based upon: CHF History: No HTN History: Yes Diabetes History: No Stroke History: No Vascular Disease History: Yes Age Score: 2 Gender Score: 0   CrCl 82 mL/min Platelet count 235K   Per office protocol, patient can hold Xarelto for 3 days prior to procedure.    Patient will not need bridging with Lovenox (enoxaparin) around procedure.

## 2020-08-03 NOTE — Telephone Encounter (Signed)
Called and left patient a message informing him that a  follow up is not needed at this time.

## 2020-08-03 NOTE — Telephone Encounter (Signed)
At this time, if he is doing well, no need for follow up here as I'm sure he has many appointments scheduled

## 2020-08-03 NOTE — Telephone Encounter (Signed)
   [facility] Pre-operative Risk Assessment    HEARTCARE STAFF: - Please ensure there is not already an duplicate clearance open for this procedure. - Under Visit Info/Reason for Call, type in Other and utilize the format Clearance MM/DD/YY or Clearance TBD. Do not use dashes or single digits. - If request is for dental extraction, please clarify the # of teeth to be extracted.  Request for surgical clearance:  1. What type of surgery is being performed? transurethral resection of bladder tumor   2. When is this surgery scheduled? 08/07/2020  3. What type of clearance is required (medical clearance vs. Pharmacy clearance to hold med vs. Both)? Both  4. Are there any medications that need to be held prior to surgery and how long? Xarelto, leaving up to cardiology   5. Practice name and name of physician performing surgery? [facility], Dr. Carole Binning   6. What is the office phone number? [phone]   7.   What is the office fax number? [phone]  8.   Anesthesia type (None, local, MAC, general) ? general   Troy Willis 08/03/2020, 9:06 AM  _________________________________________________________________   (provider comments below)

## 2020-08-04 ENCOUNTER — Other Ambulatory Visit: Payer: Self-pay | Admitting: Family Medicine

## 2020-08-04 ENCOUNTER — Telehealth: Payer: Self-pay | Admitting: Physician Assistant

## 2020-08-04 ENCOUNTER — Other Ambulatory Visit (HOSPITAL_COMMUNITY)
Admission: RE | Admit: 2020-08-04 | Discharge: 2020-08-04 | Disposition: A | Discharge status: Home / Self Care | Payer: PPO | Source: Ambulatory Visit | Attending: Urology | Admitting: Urology

## 2020-08-04 ENCOUNTER — Telehealth: Payer: Self-pay | Admitting: Family Medicine

## 2020-08-04 ENCOUNTER — Other Ambulatory Visit (HOSPITAL_COMMUNITY): Payer: PPO

## 2020-08-04 ENCOUNTER — Encounter: Payer: Self-pay | Admitting: Family Medicine

## 2020-08-04 ENCOUNTER — Other Ambulatory Visit: Payer: Self-pay | Admitting: Urology

## 2020-08-04 DIAGNOSIS — Z01812 Encounter for preprocedural laboratory examination: Secondary | ICD-10-CM | POA: Insufficient documentation

## 2020-08-04 DIAGNOSIS — Z20822 Contact with and (suspected) exposure to covid-19: Secondary | ICD-10-CM | POA: Insufficient documentation

## 2020-08-04 LAB — URINE CULTURE: Culture: NO GROWTH

## 2020-08-04 LAB — SARS CORONAVIRUS 2 (TAT 6-24 HRS): SARS Coronavirus 2: NEGATIVE

## 2020-08-04 MED ORDER — OXYCODONE-ACETAMINOPHEN 5-325 MG PO TABS
1.0000 | ORAL_TABLET | Freq: Four times a day (QID) | ORAL | 0 refills | Status: AC | PRN
Start: 2020-08-04 — End: 2020-08-09
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
## 2020-08-04 NOTE — Telephone Encounter (Signed)
Spoke with the patient's daughters in regards to patient needing an echocardiogram in April. They state that they will have him call back once he gets through his surgery to get it scheduled.

## 2020-08-04 NOTE — Telephone Encounter (Signed)
Contacted patient's daughter who was with patient at the time.  Daughter Troy Willis reports that her father is having increased pain and is now febrile.  She states that they received a recommendation from emergency provider to give acetaminophen along with his pain medication.  She reports it has been effective in lowering his fever.  We reviewed recommendations for holding Xarelto and risks of proceeding with procedure before being held 3 days.  She reports understanding.  No further recommendations made at this time.

## 2020-08-04 NOTE — Telephone Encounter (Signed)
Requesting:Oxycodone 5-325 Contract: UDS: Last Visit:02/07/20 Next Visit:n/a Last Refill:08/04/20 20 tabs 0 refills Please Advise

## 2020-08-04 NOTE — Telephone Encounter (Signed)
Unfortunately given the fact that he is on Xarelto and this needs to be held x3 days, there is not a way to move the procedure up.  And it is ok for him to take the Oxycodone for the pain he is having and then he can wean off of it after surgery.  I am not in a position to disagree with anything that cardiology or urology has said as they are the experts in this situation.

## 2020-08-04 NOTE — Telephone Encounter (Signed)
Called and spoke with daughter and she stated that she wants to include you on his provider team to carry out his procedure. I advised the patient to send a message directly to you so you can get a clear understanding as to what she is asking/needing.

## 2020-08-04 NOTE — Telephone Encounter (Signed)
good morning! patient's daughter would like for someone from preop to talk with her regarding her father

## 2020-08-04 NOTE — Telephone Encounter (Signed)
Patients daughter would like to speak to Dr. [name] about a soft tissue mass that was found on the patients ureter and managing his pain.  Please advise.  They need Dr. Virgil Benedict advice on medication so procedure can be planned.

## 2020-08-04 NOTE — Telephone Encounter (Signed)
Called and spoke with daughter and she states that patient needs this surgery before Friday and they are currently looking into switching urology to get it done sooner because of his dependence on pain medication. If they switch urology they will not have coverage for his oxycodone to manage his pain until the surgery. They would like  for you to be the middle man or the driving force so that he still get some pain meds.

## 2020-08-04 NOTE — Telephone Encounter (Signed)
Patient and daughter need advising on what medication he needs so procedure can be planned.

## 2020-08-04 NOTE — Telephone Encounter (Signed)
All of his medications are managed by cardiology and it appears that they have provided clearance to proceed w/ the procedure and how to hold/adjust his blood thinner.  As for pain control, that would be at the discretion of the urology team who is doing the procedure.

## 2020-08-05 ENCOUNTER — Ambulatory Visit (HOSPITAL_COMMUNITY): Payer: PPO

## 2020-08-05 ENCOUNTER — Encounter (HOSPITAL_COMMUNITY): Payer: Self-pay | Admitting: Urology

## 2020-08-05 ENCOUNTER — Other Ambulatory Visit: Payer: Self-pay

## 2020-08-05 ENCOUNTER — Encounter (HOSPITAL_COMMUNITY)
Admission: RE | Disposition: A | Discharge status: Home / Self Care | Payer: Self-pay | Source: Home / Self Care | Attending: Urology

## 2020-08-05 ENCOUNTER — Observation Stay (HOSPITAL_COMMUNITY)
Admission: RE | Admit: 2020-08-05 | Discharge: 2020-08-06 | Disposition: A | Discharge status: Home / Self Care | Payer: PPO | Attending: Urology | Admitting: Urology

## 2020-08-05 ENCOUNTER — Ambulatory Visit (HOSPITAL_COMMUNITY): Payer: PPO | Admitting: Physician Assistant

## 2020-08-05 DIAGNOSIS — Z79899 Other long term (current) drug therapy: Secondary | ICD-10-CM | POA: Diagnosis not present

## 2020-08-05 DIAGNOSIS — R1032 Left lower quadrant pain: Secondary | ICD-10-CM | POA: Diagnosis present

## 2020-08-05 DIAGNOSIS — N132 Hydronephrosis with renal and ureteral calculous obstruction: Principal | ICD-10-CM | POA: Insufficient documentation

## 2020-08-05 DIAGNOSIS — F329 Major depressive disorder, single episode, unspecified: Secondary | ICD-10-CM | POA: Diagnosis not present

## 2020-08-05 DIAGNOSIS — E785 Hyperlipidemia, unspecified: Secondary | ICD-10-CM | POA: Diagnosis not present

## 2020-08-05 DIAGNOSIS — N201 Calculus of ureter: Secondary | ICD-10-CM | POA: Diagnosis present

## 2020-08-05 DIAGNOSIS — D649 Anemia, unspecified: Secondary | ICD-10-CM | POA: Diagnosis not present

## 2020-08-05 DIAGNOSIS — K219 Gastro-esophageal reflux disease without esophagitis: Secondary | ICD-10-CM | POA: Diagnosis not present

## 2020-08-05 DIAGNOSIS — N1330 Unspecified hydronephrosis: Secondary | ICD-10-CM | POA: Diagnosis present

## 2020-08-05 DIAGNOSIS — I081 Rheumatic disorders of both mitral and tricuspid valves: Secondary | ICD-10-CM | POA: Diagnosis not present

## 2020-08-05 HISTORY — PX: CYSTOSCOPY WITH RETROGRADE PYELOGRAM, URETEROSCOPY AND STENT PLACEMENT: SHX5789

## 2020-08-05 HISTORY — DX: Dyspnea, unspecified: R06.00

## 2020-08-05 LAB — ABO/RH: ABO/RH(D): A POS

## 2020-08-05 LAB — TYPE AND SCREEN
ABO/RH(D): A POS
Antibody Screen: NEGATIVE

## 2020-08-05 SURGERY — CYSTOURETEROSCOPY, WITH RETROGRADE PYELOGRAM AND STENT INSERTION
Anesthesia: General | Site: Urethra

## 2020-08-05 MED ORDER — FLEET ENEMA 7-19 GM/118ML RE ENEM: 1.0000 | ENEMA | Freq: Once | RECTAL | Status: DC | PRN

## 2020-08-05 MED ORDER — PROPOFOL 10 MG/ML IV BOLUS
INTRAVENOUS | Status: DC | PRN
  Administered 2020-08-05: 200 mg via INTRAVENOUS

## 2020-08-05 MED ORDER — ONDANSETRON HCL 4 MG/2ML IJ SOLN
INTRAMUSCULAR | Status: AC
  Filled 2020-08-05: qty 2

## 2020-08-05 MED ORDER — SENNOSIDES-DOCUSATE SODIUM 8.6-50 MG PO TABS: 1.0000 | ORAL_TABLET | Freq: Every evening | ORAL | Status: DC | PRN

## 2020-08-05 MED ORDER — OXYBUTYNIN CHLORIDE 5 MG PO TABS: 5.0000 mg | ORAL_TABLET | Freq: Three times a day (TID) | ORAL | Status: DC | PRN

## 2020-08-05 MED ORDER — FENTANYL CITRATE (PF) 100 MCG/2ML IJ SOLN
INTRAMUSCULAR | Status: DC | PRN
  Administered 2020-08-05: 50 ug via INTRAVENOUS

## 2020-08-05 MED ORDER — HYDROMORPHONE HCL 1 MG/ML IJ SOLN: 0.5000 mg | INTRAMUSCULAR | Status: DC | PRN

## 2020-08-05 MED ORDER — CEFAZOLIN SODIUM-DEXTROSE 2-4 GM/100ML-% IV SOLN
2.0000 g | INTRAVENOUS | Status: AC
  Administered 2020-08-05: 2 g via INTRAVENOUS
  Filled 2020-08-05: qty 100

## 2020-08-05 MED ORDER — ACETAMINOPHEN 325 MG PO TABS: 650.0000 mg | ORAL_TABLET | ORAL | Status: DC | PRN

## 2020-08-05 MED ORDER — POTASSIUM CHLORIDE IN NACL 20-0.45 MEQ/L-% IV SOLN
INTRAVENOUS | Status: DC
  Filled 2020-08-05 (×3): qty 1000

## 2020-08-05 MED ORDER — CHLORHEXIDINE GLUCONATE 0.12 % MT SOLN
15.0000 mL | Freq: Once | OROMUCOSAL | Status: AC
  Administered 2020-08-05: 15 mL via OROMUCOSAL

## 2020-08-05 MED ORDER — ONDANSETRON HCL 4 MG/2ML IJ SOLN
INTRAMUSCULAR | Status: DC | PRN
  Administered 2020-08-05: 4 mg via INTRAVENOUS

## 2020-08-05 MED ORDER — BISACODYL 10 MG RE SUPP: 10.0000 mg | Freq: Every day | RECTAL | Status: DC | PRN

## 2020-08-05 MED ORDER — FENTANYL CITRATE (PF) 100 MCG/2ML IJ SOLN
INTRAMUSCULAR | Status: AC
  Filled 2020-08-05: qty 2

## 2020-08-05 MED ORDER — LIDOCAINE 2% (20 MG/ML) 5 ML SYRINGE
INTRAMUSCULAR | Status: AC
  Filled 2020-08-05: qty 5

## 2020-08-05 MED ORDER — DEXAMETHASONE SODIUM PHOSPHATE 10 MG/ML IJ SOLN
INTRAMUSCULAR | Status: DC | PRN
  Administered 2020-08-05: 10 mg via INTRAVENOUS

## 2020-08-05 MED ORDER — ONDANSETRON HCL 4 MG/2ML IJ SOLN: 4.0000 mg | INTRAMUSCULAR | Status: DC | PRN

## 2020-08-05 MED ORDER — FENTANYL CITRATE (PF) 100 MCG/2ML IJ SOLN: 25.0000 ug | INTRAMUSCULAR | Status: DC | PRN

## 2020-08-05 MED ORDER — STERILE WATER FOR IRRIGATION IR SOLN
Status: DC | PRN
  Administered 2020-08-05: 1000 mL

## 2020-08-05 MED ORDER — PROPOFOL 10 MG/ML IV BOLUS
INTRAVENOUS | Status: AC
  Filled 2020-08-05: qty 20

## 2020-08-05 MED ORDER — OXYCODONE HCL 5 MG PO TABS
5.0000 mg | ORAL_TABLET | ORAL | Status: DC | PRN
  Administered 2020-08-05: 5 mg via ORAL
  Filled 2020-08-05: qty 1

## 2020-08-05 MED ORDER — LIDOCAINE 2% (20 MG/ML) 5 ML SYRINGE
INTRAMUSCULAR | Status: DC | PRN
  Administered 2020-08-05: 100 mg via INTRAVENOUS

## 2020-08-05 MED ORDER — CEFAZOLIN SODIUM-DEXTROSE 1-4 GM/50ML-% IV SOLN
1.0000 g | Freq: Three times a day (TID) | INTRAVENOUS | Status: DC
  Administered 2020-08-06: 1 g via INTRAVENOUS
  Filled 2020-08-05: qty 50

## 2020-08-05 MED ORDER — PHENYLEPHRINE 40 MCG/ML (10ML) SYRINGE FOR IV PUSH (FOR BLOOD PRESSURE SUPPORT)
PREFILLED_SYRINGE | INTRAVENOUS | Status: DC | PRN
  Administered 2020-08-05: 120 ug via INTRAVENOUS
  Administered 2020-08-05: 80 ug via INTRAVENOUS

## 2020-08-05 MED ORDER — LACTATED RINGERS IV SOLN: INTRAVENOUS | Status: DC

## 2020-08-05 MED ORDER — ZOLPIDEM TARTRATE 5 MG PO TABS: 5.0000 mg | ORAL_TABLET | Freq: Every evening | ORAL | Status: DC | PRN

## 2020-08-05 MED ORDER — SODIUM CHLORIDE 0.9 % IR SOLN
Status: DC | PRN
  Administered 2020-08-05 (×2): 3000 mL via INTRAVESICAL

## 2020-08-05 MED ORDER — DEXAMETHASONE SODIUM PHOSPHATE 10 MG/ML IJ SOLN
INTRAMUSCULAR | Status: AC
  Filled 2020-08-05: qty 1

## 2020-08-05 SURGICAL SUPPLY — 30 items
BAG URINE DRAIN 2000ML AR STRL (UROLOGICAL SUPPLIES) IMPLANT
BAG URO CATCHER STRL LF (MISCELLANEOUS) ×3 IMPLANT
BASKET STONE NCOMPASS (UROLOGICAL SUPPLIES) IMPLANT
CATH FOLEY 3WAY 30CC 22FR (CATHETERS) IMPLANT
CATH URET 5FR 28IN OPEN ENDED (CATHETERS) IMPLANT
CATH URET DUAL LUMEN 6-10FR 50 (CATHETERS) ×3 IMPLANT
CLOTH BEACON ORANGE TIMEOUT ST (SAFETY) ×3 IMPLANT
DRAPE FOOT SWITCH (DRAPES) ×3 IMPLANT
EXTRACTOR STONE NITINOL NGAGE (UROLOGICAL SUPPLIES) ×3 IMPLANT
GLOVE SURG POLYISO LF SZ8 (GLOVE) ×3 IMPLANT
GOWN STRL REUS W/TWL XL LVL3 (GOWN DISPOSABLE) ×3 IMPLANT
GUIDEWIRE STR DUAL SENSOR (WIRE) ×3 IMPLANT
HOLDER FOLEY CATH W/STRAP (MISCELLANEOUS) IMPLANT
IV NS IRRIG 3000ML ARTHROMATIC (IV SOLUTION) ×3 IMPLANT
KIT TURNOVER KIT A (KITS) ×3 IMPLANT
LASER FIB FLEXIVA PULSE ID 365 (Laser) IMPLANT
LASER FIB FLEXIVA PULSE ID 550 (Laser) IMPLANT
LASER FIB FLEXIVA PULSE ID 910 (Laser) IMPLANT
LOOP CUT BIPOLAR 24F LRG (ELECTROSURGICAL) IMPLANT
MANIFOLD NEPTUNE II (INSTRUMENTS) ×3 IMPLANT
PACK CYSTO (CUSTOM PROCEDURE TRAY) ×3 IMPLANT
SHEATH URETERAL 12FRX35CM (MISCELLANEOUS) ×3 IMPLANT
SUT ETHILON 3 0 PS 1 (SUTURE) IMPLANT
SYR 30ML LL (SYRINGE) IMPLANT
SYR TOOMEY IRRIG 70ML (MISCELLANEOUS)
SYRINGE TOOMEY IRRIG 70ML (MISCELLANEOUS) IMPLANT
TRACTIP FLEXIVA PULS ID 200XHI (Laser) IMPLANT
TRACTIP FLEXIVA PULSE ID 200 (Laser)
TUBING CONNECTING 10 (TUBING) ×3 IMPLANT
TUBING UROLOGY SET (TUBING) ×3 IMPLANT

## 2020-08-05 NOTE — Transfer of Care (Signed)
Immediate Anesthesia Transfer of Care Note  Patient: Troy Willis  Procedure(s) Performed: CYSTOSCOPY WITH RETROGRADE PYELOGRAM,  LEFT URETEROSCOPY AND   STENT PLACEMENT (Left Urethra)  Patient Location: PACU  Anesthesia Type:General  Level of Consciousness: awake and alert   Airway & Oxygen Therapy: Patient Spontanous Breathing and Patient connected to face mask oxygen  Post-op Assessment: Report given to RN and Post -op Vital signs reviewed and stable  Post vital signs: Reviewed and stable  Last Vitals:  Vitals Value Taken Time  BP 130/86 08/05/20 1909  Temp    Pulse 78 08/05/20 1911  Resp 9 08/05/20 1911  SpO2 100 % 08/05/20 1911  Vitals shown include unvalidated device data.  Last Pain:  Vitals:   08/05/20 1427  TempSrc: Oral         Complications: No complications documented.

## 2020-08-05 NOTE — Anesthesia Procedure Notes (Signed)
Date/Time: 08/05/2020 7:03 PM Performed by: Cynda Familia, CRNA Oxygen Delivery Method: Simple face mask Placement Confirmation: positive ETCO2 and breath sounds checked- equal and bilateral Dental Injury: Teeth and Oropharynx as per pre-operative assessment

## 2020-08-05 NOTE — Op Note (Signed)
Procedure: 1.  Cystoscopy with left retrograde pyelogram and interpretation. 2.  Left ureteroscopic stone extraction. 3.  Insertion of left double-J stent. 4.  Application of fluoroscopy.  Preop diagnosis: Left hydronephrosis with renal colic and possible left trigonal mass.  Postop diagnosis: Left trigonal edema with low density proteinaceous stone in the distal ureter and small probable calcium stones in the renal pelvis.  Surgeon: Dr. Irine Seal.  Anesthesia: General.  Specimen: Stone material.  Drains: 6 French by 26 cm right contour double-J stent.  EBL: None.  Complications: None.  Indications: Troy Willis is a 75 year old male who originally was seen in the emergency room on Sunday evening with acute left renal colic with a possible mass in the area of the left trigone.  He has continued to have pain and it was felt that urgent intervention was indicated.  He has a history of atrial fibrillation has been on Xarelto but that was last taken 48 hours ago.  Procedure: He was given 2 g of Ancef.  A general anesthetic was induced.  He was placed in lithotomy position and fitted with PAS hose.  His perineum and genitalia were prepped with Betadine solution he was draped in usual sterile fashion.  Cystoscopy was performed using 23 [place] scope and 30 and 70 degree lenses.  He was noted to have a normal urethra.  The external sphincter was intact.  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  Examination of bladder revealed mild trabeculation.  The right ureteral orifice was unremarkable.  The left ureteral orifice had some heaped up edema that is most consistent with recently passed stone and in the base the bladder there was a small matrix appearing stone which was evacuated from the bladder.  There were actually 2 of them and they were both rather soft.  He was noticed to efflux slightly bloody urine from the left distal ureter and clear urine from the right distal  ureter.  A left retrograde pyelogram was performed with a 5 [place] open-ended catheter and Omnipaque.  The left retrograde pyelogram demonstrated some narrowing of the intramural ureter with dilation approximately 2 to 3 cm above this.  There was a kink in the ureter in the mid proximal area and in the renal pelvis there was a fusiform filling defect that flush back into the kidney.  Intrarenal collecting system was mildly dilated without other filling defects.  After completion the retrograde pyelogram a sensor wire was advanced to the open-ended catheter to the kidney.  A 35 cm digital access sheath 12 French inner core was advanced over the wire and easily advanced into the proximal ureter.  The assembled sheath was then advanced also without difficulty.  The inner core and wire were then removed.  The dual-lumen digital flexible scope was then advanced to the kidney there was some inflammatory changes in the proximal ureter suggestive of recent presence of the stone.  Inspection of the intrarenal collecting system demonstrated a collection of small stones with some associated matrix the material in the lower calyces.  The stones were removed with the engage basket.  No other stones were identified.  There was no papillary necrosis and no evidence of the urothelial neoplasm.  Once ureteroscopy was complete the scope was removed and a sensor wire was replaced to the kidney through the sheath.  The sheath was then removed and the cystoscope was replaced over the wire.  A 6 French by 26 cm contour double-J stent without tether was then advanced  to the kidney under fluoroscopic guidance.  The wire was removed, leaving a good coil in the kidney and a good coil in the bladder.  The bladder was drained and the cystoscope was removed.  He was taken down from lithotomy position, his anesthetic was reversed and he was moved recovery in stable condition.  There were no complications.  Despite their Xarelto there  was minimal bleeding associated with the procedure.  The stones will be shown to the family then taken to the office for analysis.

## 2020-08-05 NOTE — Anesthesia Postprocedure Evaluation (Signed)
Anesthesia Post Note  Patient: Troy Willis  Procedure(s) Performed: CYSTOSCOPY WITH RETROGRADE PYELOGRAM,  LEFT URETEROSCOPY AND   STENT PLACEMENT (Left Urethra)     Patient location during evaluation: PACU Anesthesia Type: General Level of consciousness: awake and alert Pain management: pain level controlled Vital Signs Assessment: post-procedure vital signs reviewed and stable Respiratory status: spontaneous breathing, nonlabored ventilation and respiratory function stable Cardiovascular status: blood pressure returned to baseline and stable Postop Assessment: no apparent nausea or vomiting Anesthetic complications: no   No complications documented.  Last Vitals:  Vitals:   08/05/20 1945 08/05/20 2011  BP: 139/85 132/88  Pulse: 82 86  Resp: 14 16  Temp: 36.7 C 36.6 C  SpO2: 97% 97%    Last Pain:  Vitals:   08/05/20 2011  TempSrc: Oral  PainSc:                  Lyda Colcord,W. EDMOND

## 2020-08-05 NOTE — Plan of Care (Signed)
  Problem: Education: Goal: Knowledge of General Education information will improve Description Including pain rating scale, medication(s)/side effects and non-pharmacologic comfort measures Outcome: Progressing   

## 2020-08-05 NOTE — Interval H&P Note (Signed)
History and Physical Interval Note:  We moved his procedure up to today at the request of his family because he was having pain control issues yesterday.  His pain has been variable but better today.   He has been off of the Xarelto for 48 hrs and per cardiology suggestions, a type and screen was obtained.     08/05/2020 4:59 PM  [place]  has presented today for surgery, with the diagnosis of LEFT URETERAL OBSTRUCTION WIHT POSIBLE NEOPLASM.  The various methods of treatment have been discussed with the patient and family. After consideration of risks, benefits and other options for treatment, the patient has consented to  Procedure(s) with comments: POSSIBLE TRANSURETHRAL RESECTION OF BLADDER TUMOR WITH POSSIBLE GEMCITABINE (N/A) - REQUESTING 1 HR FOR CASE CYSTOSCOPY WITH RETROGRADE PYELOGRAM, POSSIBLE LEFT URETEROSCOPY AND POSSIBLE  STENT PLACEMENT (Left) as a surgical intervention.  The patient's history has been reviewed, patient examined, no change in status, stable for surgery.  I have reviewed the patient's chart and labs.  Questions were answered to the patient's satisfaction.     Irine Seal

## 2020-08-05 NOTE — Discharge Instructions (Signed)
Ureteral Stent Implantation, Care After This sheet gives you information about how to care for yourself after your procedure. Your health care provider may also give you more specific instructions. If you have problems or questions, contact your health care provider. What can I expect after the procedure? After the procedure, it is common to have:  Nausea.  Mild pain when you urinate. You may feel this pain in your lower back or lower abdomen. The pain should stop within a few minutes after you urinate. This may last for up to 1 week.  A small amount of blood in your urine for several days. Follow these instructions at home: Medicines  Take over-the-counter and prescription medicines only as told by your health care provider.  If you were prescribed an antibiotic medicine, take it as told by your health care provider. Do not stop taking the antibiotic even if you start to feel better.  Do not drive for 24 hours if you were given a sedative during your procedure.  Ask your health care provider if the medicine prescribed to you requires you to avoid driving or using heavy machinery. Activity  Rest as told by your health care provider.  Avoid sitting for a long time without moving. Get up to take short walks every 1-2 hours. This is important to improve blood flow and breathing. Ask for help if you feel weak or unsteady.  Return to your normal activities as told by your health care provider. Ask your health care provider what activities are safe for you. General instructions  Watch for any blood in your urine. Call your health care provider if the amount of blood in your urine increases.  If you have a catheter: ? Follow instructions from your health care provider about taking care of your catheter and collection bag. ? Do not take baths, swim, or use a hot tub until your health care provider approves. Ask your health care provider if you may take showers. You may only be allowed to  take sponge baths.  Drink enough fluid to keep your urine pale yellow.  Do not use any products that contain nicotine or tobacco, such as cigarettes, e-cigarettes, and chewing tobacco. These can delay healing after surgery. If you need help quitting, ask your health care provider.  Keep all follow-up visits as told by your health care provider. This is important.   Contact a health care provider if:  You have pain that gets worse or does not get better with medicine, especially pain when you urinate.  You have difficulty urinating.  You feel nauseous or you vomit repeatedly during a period of more than 2 days after the procedure. Get help right away if:  Your urine is dark red or has blood clots in it.  You are leaking urine (have incontinence).  The end of the stent comes out of your urethra.  You cannot urinate.  You have sudden, sharp, or severe pain in your abdomen or lower back.  You have a fever.  You have swelling or pain in your legs.  You have difficulty breathing. Summary  After the procedure, it is common to have mild pain when you urinate that goes away within a few minutes after you urinate. This may last for up to 1 week.  Watch for any blood in your urine. Call your health care provider if the amount of blood in your urine increases.  Take over-the-counter and prescription medicines only as told by your health care provider.  Drink enough fluid to keep your urine pale yellow. This information is not intended to replace advice given to you by your health care provider. Make sure you discuss any questions you have with your health care provider. Document Revised: 02/06/2018 Document Reviewed: 02/07/2018 Elsevier Patient Education  2021 [employer].

## 2020-08-05 NOTE — Anesthesia Preprocedure Evaluation (Addendum)
Anesthesia Evaluation  Patient identified by MRN, date of birth, ID band Patient awake    Reviewed: Allergy & Precautions, H&P , NPO status , Patient's Chart, lab work & pertinent test results  Airway Mallampati: II  TM Distance: >3 FB Neck ROM: Full    Dental no notable dental hx. (+) Teeth Intact, Dental Advisory Given   Pulmonary COPD,    Pulmonary exam normal breath sounds clear to auscultation       Cardiovascular Exercise Tolerance: Good + CAD, + Past MI and + Cardiac Stents  + dysrhythmias Atrial Fibrillation + Valvular Problems/Murmurs MVP  Rhythm:Irregular Rate:Normal     Neuro/Psych Depression    GI/Hepatic Neg liver ROS, GERD  Medicated,  Endo/Other  negative endocrine ROS  Renal/GU negative Renal ROS  negative genitourinary   Musculoskeletal  (+) Arthritis , Osteoarthritis,    Abdominal   Peds  Hematology  (+) Blood dyscrasia, anemia ,   Anesthesia Other Findings   Reproductive/Obstetrics negative OB ROS                            Anesthesia Physical Anesthesia Plan  ASA: III  Anesthesia Plan: General   Post-op Pain Management:    Induction: Intravenous  PONV Risk Score and Plan: 3 and Ondansetron and Dexamethasone  Airway Management Planned: LMA  Additional Equipment:   Intra-op Plan:   Post-operative Plan: Extubation in OR  Informed Consent: I have reviewed the patients History and Physical, chart, labs and discussed the procedure including the risks, benefits and alternatives for the proposed anesthesia with the patient or authorized representative who has indicated his/her understanding and acceptance.     Dental advisory given  Plan Discussed with: CRNA  Anesthesia Plan Comments:        Anesthesia Quick Evaluation

## 2020-08-05 NOTE — Progress Notes (Signed)
Called patient's daughter to inform her surgery will not be until 1836. She verbalizes understanding.

## 2020-08-06 ENCOUNTER — Encounter (HOSPITAL_COMMUNITY): Payer: Self-pay | Admitting: Urology

## 2020-08-06 DIAGNOSIS — N132 Hydronephrosis with renal and ureteral calculous obstruction: Secondary | ICD-10-CM | POA: Diagnosis not present

## 2020-08-06 DIAGNOSIS — N201 Calculus of ureter: Secondary | ICD-10-CM | POA: Diagnosis not present

## 2020-08-06 DIAGNOSIS — N1330 Unspecified hydronephrosis: Secondary | ICD-10-CM | POA: Diagnosis present

## 2020-08-06 MED ORDER — PHENAZOPYRIDINE HCL 200 MG PO TABS: 200.0000 mg | ORAL_TABLET | Freq: Three times a day (TID) | ORAL | 1 refills | Status: DC | PRN

## 2020-08-06 MED ORDER — CEPHALEXIN 500 MG PO CAPS: 500.0000 mg | ORAL_CAPSULE | Freq: Three times a day (TID) | ORAL | 0 refills | Status: AC

## 2020-08-06 NOTE — Discharge Summary (Signed)
Physician Discharge Summary  Patient ID: Troy Willis MRN: [id number] DOB/AGE: 18-Jan-1946 75 y.o.  Admit date: 08/05/2020 Discharge date: 08/06/2020  Admission Diagnoses:  Hydronephrosis of left kidney  Discharge Diagnoses:  Principal Problem:   Hydronephrosis of left kidney Active Problems:   Left ureteral stone   Past Medical History:  Diagnosis Date  . Anemia    mild   . Arthritis    hands, lower back  . Bronchiectasis ([place])   . CAD (coronary artery disease), native coronary artery    a. 08/2015 Cath/PCI: LM nl, LAD 35m, LCX small, nl, RCA 80p (4.0x16 Synergy DES),   . Cancer ([place])    skin on forehead  . Cataracts, bilateral    surgery  . Depression    pt denies   . Diverticulosis   . Dyspnea    occasional   . Dysrhythmia     Afib  . GERD (gastroesophageal reflux disease)   . Heart murmur   . Hilar adenopathy    a. 08/2015 CT Chest: mild bilat hilar adenopathy and mild soft tissue prominence in inf right hilum, left hilum. Also areas of soft tissue and low-density material filling LLL bronchial airways->? mucous vs mass.  [name] Hx of adenomatous colonic polyps 08/13/2015  . Hyperlipidemia   . MVP (mitral valve prolapse)    bileaflet with mild to moderate MR  . Myocardial infarction ([place])   . Nonspecific elevation of levels of transaminase or lactic acid dehydrogenase (LDH)    PMH of ; ? due to Amiodarone   . 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 Permanent atrial fibrillation ([facility])    a. CHA2DS2VASc = 2 -->Xarelto started 08/2015.  [name] Pneumonia, bacterial 05/18/2012  . Pulmonary HTN ([facility])    PASP 26mmHg on echo 08/2019>>Likely Group 3 related to bronchiectasis  . Pulmonary nodules    a. 08/2015 CT Chest: reticulonodular opacities in RUL - largest 51mm - likely 2/2 inflammatory process.  . Stroke ([place])   . Tricuspid regurgitation     Surgeries: Procedure(s): CYSTOSCOPY WITH RETROGRADE PYELOGRAM,  LEFT URETEROSCOPY AND   STENT PLACEMENT on 08/05/2020   Consultants  (if any):   Discharged Condition: Improved  Hospital Course: [name] is an 75 y.o. male who was admitted 08/05/2020 with a diagnosis of Hydronephrosis of left kidney and went to the operating room on 08/05/2020 and underwent the above named procedures.  He was found to have edema of the left trigone with a small soft matrix type stone in the bladder.  He then was noted to have a filling defect in the proximal ureter that was found on ureteroscopy to be a collection of small stones in matrix.  The stones were removed and he was stented.  He was admitted for observation because of the concern for infection associated with matrix stones but has remained afebrile and is tolerating the stent.    He was given perioperative antibiotics:  Anti-infectives (From admission, onward)   Start     Dose/Rate Route Frequency Ordered Stop   08/06/20 0600  ceFAZolin (ANCEF) IVPB 1 g/50 mL premix        1 g 100 mL/hr over 30 Minutes Intravenous Every 8 hours 08/05/20 1928     08/06/20 0000  cephALEXin (KEFLEX) 500 MG capsule        500 mg Oral 3 times daily 08/06/20 0754 08/16/20 2359   08/05/20 1427  ceFAZolin (ANCEF) IVPB 2g/100 mL premix        2 g 200 mL/hr over  30 Minutes Intravenous 30 min pre-op 08/05/20 1427 08/05/20 1815    .  He was given sequential compression devices for DVT prophylaxis.  He benefited maximally from the hospital stay and there were no complications.    Recent vital signs:  Vitals:   08/06/20 0204 08/06/20 0557  BP: 120/69 (!) 134/96  Pulse: 83 70  Resp: 16 16  Temp: (!) 97.4 F (36.3 C) 97.8 F (36.6 C)  SpO2: 95% 96%    Recent laboratory studies:  Lab Results  Component Value Date   HGB 12.6 (L) 08/02/2020   HGB 11.5 (L) 04/02/2020   HGB 12.2 (L) 02/07/2020   Lab Results  Component Value Date   WBC 9.2 08/02/2020   PLT 235 08/02/2020   Lab Results  Component Value Date   INR 2.8 11/12/2009   Lab Results  Component Value Date   NA 131 (L)  08/02/2020   K 4.1 08/02/2020   CL 99 08/02/2020   CO2 24 08/02/2020   BUN 17 08/02/2020   CREATININE 0.83 08/02/2020   GLUCOSE 124 (H) 08/02/2020    Discharge Medications:   Allergies as of 08/06/2020      Reactions   Amiodarone Other (See Comments)   elevated liver enzymes   Black Pepper-turmeric Other (See Comments)   heartburn   Metoprolol Other (See Comments)   Hypotension      Medication List    TAKE these medications   atorvastatin 80 MG tablet Commonly known as: LIPITOR Take 1 tablet (80 mg total) by mouth daily.   cephALEXin 500 MG capsule Commonly known as: KEFLEX Take 1 capsule (500 mg total) by mouth 3 (three) times daily for 10 days.   famotidine 20 MG tablet Commonly known as: PEPCID Take 20 mg by mouth daily as needed for heartburn or indigestion.   Flutter Devi Use as directed   furosemide 20 MG tablet Commonly known as: LASIX Take 20 mg by mouth daily as needed for fluid or edema.   hydrocortisone 2.5 % ointment Apply 1 application topically 2 (two) times daily as needed (irritation).   Melatonin 5 MG Caps Take 5 mg by mouth at bedtime.   MOVE FREE PO Take 1 tablet by mouth daily.   Mucinex Maximum Strength 1200 MG Tb12 Generic drug: Guaifenesin Take 1,200 mg by mouth at bedtime.   multivitamin tablet Take 1 tablet by mouth daily.   nitroGLYCERIN 0.4 MG SL tablet Commonly known as: NITROSTAT Place 1 tablet (0.4 mg total) under the tongue every 5 (five) minutes x 3 doses as needed for chest pain.   oxyCODONE-acetaminophen 5-325 MG tablet Commonly known as: PERCOCET/ROXICET Take 1 tablet by mouth every 6 (six) hours as needed for up to 5 days for severe pain.   phenazopyridine 200 MG tablet Commonly known as: Pyridium Take 1 tablet (200 mg total) by mouth 3 (three) times daily as needed for pain (for burning).   rivaroxaban 20 MG Tabs tablet Commonly known as: Xarelto TAKE ONE TABLET BY MOUTH ONE TIME DAILY with supper What  changed:   how much to take  how to take this  when to take this  additional instructions   tiZANidine 2 MG tablet Commonly known as: ZANAFLEX Take 2 mg by mouth daily.   triamcinolone 0.1 % Commonly known as: KENALOG Apply 1 application topically daily as needed (rash).   Vitamin D3 50 MCG (2000 UT) Tabs Take 2,000 Units by mouth daily.       Diagnostic Studies: CT  ABDOMEN PELVIS W CONTRAST  Result Date: 08/02/2020 CLINICAL DATA:  Diverticulitis suspected. Left-sided abdominal pain. Pain goes from the ribcage down to groin area. Nausea and vomiting. Difficulty urinating. EXAM: CT ABDOMEN AND PELVIS WITH CONTRAST TECHNIQUE: Multidetector CT imaging of the abdomen and pelvis was performed using the standard protocol following bolus administration of intravenous contrast. CONTRAST:  17mL OMNIPAQUE IOHEXOL 300 MG/ML  SOLN COMPARISON:  CT chest 10/21/2019 FINDINGS: Lower chest: Similar-appearing right middle lobe cystic changes that are partially visualized. Interval development of a nodular-like 5 mm subpleural subsolid density (5:2). Linear atelectasis versus scarring within bilateral lower lobes. Hepatobiliary: No focal liver abnormality. No gallstones, gallbladder wall thickening, or pericholecystic fluid. No biliary dilatation. Pancreas: No focal lesion. Normal pancreatic contour. No surrounding inflammatory changes. No main pancreatic ductal dilatation. Spleen: Normal in size without focal abnormality. Adrenals/Urinary Tract: No adrenal nodule bilaterally. Delayed left nephrogram and left perinephric stranding. Couple of punctate calcified stones within the left kidney are noted. No right nephrolithiasis. Fullness of the left collecting system with no frank hydronephrosis. Mild left hydroureter. Question soft tissue density at the left ureterovesicular junction (7:66, 6:43). No right hydronephrosis. No right hydroureter. No ureterolithiasis bilaterally. The urinary bladder is  unremarkable. Stomach/Bowel: Stomach is within normal limits. No evidence of bowel wall thickening or dilatation. The appendix not definitely identified. Vascular/Lymphatic: No abdominal aorta or iliac aneurysm. Moderate calcified and noncalcified atherosclerotic plaque of the aorta and its branches. No abdominal, pelvic, or inguinal lymphadenopathy. Reproductive: Prostate is unremarkable. Other: No intraperitoneal free fluid. No intraperitoneal free gas. No organized fluid collection. Musculoskeletal: No abdominal wall hernia or abnormality. No suspicious lytic or blastic osseous lesions. No acute displaced fracture. Interval disc space vacuum phenomenon. Retrolisthesis of L5 on S1 with no severe osseous central canal stenosis. IMPRESSION: 1. Delayed left nephrogram with mild fullness of the left collecting system and question of soft tissue density lesion at the left ureterovesicular junction. Recommend urologic consultation. 2. Nonobstructive punctate left nephrolithiasis. 3. Interval development of a nodular-like 5 mm subpleural subsolid density. No follow-up recommended. This recommendation follows the consensus statement: Guidelines for Management of Incidental Pulmonary Nodules Detected on CT Images: From the Fleischner Society 2017; Radiology 2017; 284:228-243. 4.  Aortic Atherosclerosis (ICD10-I70.0). Electronically Signed   By: Iven Finn M.D.   On: 08/02/2020 15:19   DG C-Arm 1-60 Min-No Report  Result Date: 08/05/2020 Fluoroscopy was utilized by the requesting physician.  No radiographic interpretation.    Disposition: Discharge disposition: 01-Home or Self Care       Discharge Instructions    Discontinue IV   Complete by: As directed        Follow-up Information    Irine Seal, MD.   Specialty: Urology Why: The office will call to arrange f/u for the stent removal for 1-2 weeks.  Contact information: [place] [address] [phone]                 Signed: Irine Seal 08/06/2020, 8:11 AM

## 2020-08-07 DIAGNOSIS — N132 Hydronephrosis with renal and ureteral calculous obstruction: Secondary | ICD-10-CM | POA: Diagnosis not present

## 2020-08-13 DIAGNOSIS — M25512 Pain in left shoulder: Secondary | ICD-10-CM | POA: Diagnosis not present

## 2020-08-13 DIAGNOSIS — M6281 Muscle weakness (generalized): Secondary | ICD-10-CM | POA: Diagnosis not present

## 2020-08-13 DIAGNOSIS — M75122 Complete rotator cuff tear or rupture of left shoulder, not specified as traumatic: Secondary | ICD-10-CM | POA: Diagnosis not present

## 2020-08-13 DIAGNOSIS — M25612 Stiffness of left shoulder, not elsewhere classified: Secondary | ICD-10-CM | POA: Diagnosis not present

## 2020-08-17 ENCOUNTER — Ambulatory Visit: Payer: PPO | Admitting: Acute Care

## 2020-08-17 ENCOUNTER — Encounter: Payer: Self-pay | Admitting: Acute Care

## 2020-08-17 ENCOUNTER — Other Ambulatory Visit: Payer: Self-pay

## 2020-08-17 VITALS — BP 112/72 | HR 65 | Temp 97.2°F | Ht 75.0 in | Wt 157.4 lb

## 2020-08-17 DIAGNOSIS — I2721 Secondary pulmonary arterial hypertension: Secondary | ICD-10-CM

## 2020-08-17 DIAGNOSIS — J849 Interstitial pulmonary disease, unspecified: Secondary | ICD-10-CM | POA: Diagnosis not present

## 2020-08-17 DIAGNOSIS — R5381 Other malaise: Secondary | ICD-10-CM | POA: Diagnosis not present

## 2020-08-17 DIAGNOSIS — N202 Calculus of kidney with calculus of ureter: Secondary | ICD-10-CM | POA: Diagnosis not present

## 2020-08-17 DIAGNOSIS — J479 Bronchiectasis, uncomplicated: Secondary | ICD-10-CM

## 2020-08-17 DIAGNOSIS — N201 Calculus of ureter: Secondary | ICD-10-CM | POA: Diagnosis not present

## 2020-08-17 DIAGNOSIS — Z0000 Encounter for general adult medical examination without abnormal findings: Secondary | ICD-10-CM

## 2020-08-17 NOTE — Patient Instructions (Addendum)
It is good to see you today. We will plan a HRCT 11/2020 to monitor the pulmonary Fibrosis. Continue Mucinex 1200 mg daily Continue using flutter valve for congestion, and as maintenance. Follow up echo 09/07/2020 as is already ordered.  Work on increasing your activity as able.  Follow up with Dr. Valeta Harms or [name] NP  in July after follow up CT Chest Continue using lasix as needed for swelling.  Remember to get second Covid Booster before your trip to [place]. If your CT shows progression of ILD, we will do a 6 minute walk. Follow up 11/2020 with Dr. Valeta Harms or [name] NP after CT Chest. Consider 6 minute walk if progression of ILD on CT 11/2020. Please contact office for sooner follow up if symptoms do not improve or worsen or seek emergency care

## 2020-08-17 NOTE — Progress Notes (Signed)
Thanks for seeing him  Garner Nash, DO [facility] 08/17/2020 5:40 PM

## 2020-08-17 NOTE — Progress Notes (Signed)
History of Present Illness Troy Willis is a 75 y.o. 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 Valeta Harms  08/17/2020  Pt. Presents for follow up. He has recently been battling kidney stones, and a UTI, he had a recent ED visit 08/05/2020 . 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 He had a scope and placement of a stent which he is having removed today.   From a breathing perspective he has been short of breath a bit more than his baseline. He thinks this is due to the recent  issues with his kidney, and his inability to exercise.[name] He did cough up some blood last month, which self resolved. He has been compliant with his Mucinex. He is using his flutter valve less regularly, but at intervals. He gets short of breath with mild exertion.He does not endorse any lower extremity swelling . He does have lasix to take for swelling as needed. He has only had to use this twice since it was prescribed.. He denies any fever, chest pain, orthopnea.  Bronchiectasis is relatively stable, will evaluate ILD and follow up nodule 11/2020 with HRCT Chest, and PAH with Echo 08/2020.Will refer to Dr. Silas Flood if worsening of [place] noted.   Test Results: Alpha 1 testing 02/2016 > M/M Echo 01/2019 Left ventricular ejection fraction, by visual estimation, is 65 to 70%. The left ventricle has hyperdynamic function. Normal left ventricular size. There is no left ventricular hypertrophy. Left ventricular diastolic function could not be evaluated pattern of LV diastolic filling. Global right ventricle has normal systolic function.The right ventricular size is normal. No increase in right ventricular wall thickness. Left atrial size was moderately dilated. Right atrial size was severely dilated. The mitral valve is normal in structure. Moderate mitral valve regurgitation. No evidence of mitral stenosis. The  tricuspid valve is normal in structure. Tricuspid valve regurgitation severe. The aortic valve is normal in structure. Aortic valve regurgitation was not visualized by color flow Doppler. Structurally normal aortic valve, with no evidence of sclerosis or stenosis. The pulmonic valve was not assessed. Pulmonic valve regurgitation is not visualized by color flow Doppler. Moderately elevated pulmonary artery systolic pressure. The tricuspid regurgitant velocity is 2.75 m/s, and with an assumed right atrial pressure of 15 mmHg, the estimated right ventricular systolic pressure is moderately elevated at 45.2 mmHg. The inferior vena cava is normal in size with greater than 50% respiratory variability, suggesting right atrial pressure of 3 mmHg.  In comparison to the previous echocardiogram(s): Prior examinations were reviewed in a side by side comparison of images. When compared to the prior study from 03/03/2017 RVSP is unchanged, now 45 mmHg, previously 47 mmHg, consistent with moderate pulmonary hypertension.  Echo showed normal LVF with enlarged atrial, moderate MR, severe TR and moderate PHTN. Compared to prior echo the MR has progressed.  Repeat echo in 6 months per Cards to re-evaluate.   6MW: 09/2016 660M,O2 saturation99% 03/2017 600M,O2 saturation100% 09/26/2018: 714 feet, O2 saturation 97%>>was wearing mask  HRCT 04/2019 IMPRESSION: The appearance of the lungs remains compatible with interstitial lung disease, with stable findings compared to prior studies, again categorized as indeterminate for usual interstitial pneumonia (UIP) per current ATS guidelines. 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 While most of the previously noted pulmonary nodules are stable, there are some new nodules, as detailed above. The largest of these has a mean diameter of  7 mm in the lateral segment of the right middle lobe. Non-contrast chest CT at 6 months is  recommended. If the nodules are stable at time of repeat CT, then future CT at 18-24 months (from today's scan) is considered optional for low-risk patients, but is recommended for high-risk patients.  Aortic atherosclerosis, in addition to left main and 3 vessel coronary artery disease. Assessment for potential risk factor modification, dietary therapy or pharmacologic therapy may be warranted, if clinically indicated. Mild cardiomegaly with biatrial dilatation.  Aortic Atherosclerosis (ICD10-I70.0).  02/2019 CT Chest  The appearance of the lungs remains compatible with interstitial lung disease, with stable findings compared to prior studies, again categorized as indeterminate for usual interstitial pneumonia (UIP) per current ATS guidelines. 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 While most of the previously noted pulmonary nodules are stable, there are some new nodules, as detailed above. The largest of these has a mean diameter of 7 mm in the lateral segment of the right middle lobe. Non-contrast chest CT at 6 months is recommended. If the nodules are stable at time of repeat CT, then future CT at 18-24 months (from today's scan) is considered optional for low-risk patients, but is recommended for high-risk patients. This recommendation follows the consensus statement: Guidelines for Management of Incidental Pulmonary Nodules Detected on CT Images: From the Fleischner Society 2017; Radiology 2017; 284:228-243. Aortic atherosclerosis, in addition to left main and 3 vessel coronary artery disease. Assessment for potential risk factor modification, dietary therapy or pharmacologic therapy may be warranted, if clinically indicated. Mild cardiomegaly with biatrial dilatation.   HRCT 10/2018 There is very subtle peripheral interstitial reticular opacity without a clear apical to basal gradient, and with superimposed bandlike scarring or  atelectasis of the bilateral lung bases. There is mild tubular bronchiectasis throughout without bronchiolectasis or honeycombing. Findings are not significantly changed in comparison to prior examinations dating back to 10/05/2015 and remain consistent with mild pulmonary fibrosis in an "indeterminate for UIP (early)" pattern by ATS pulmonary fibrosis criteria, differential considerations include both UIP and NSIP. There is superimposed scarring related to prior infection or aspiration.  There is a new 7 mm pulmonary nodule of the paramedian left upper lobe (series 3, image 68), nonspecific. Recommend follow-up for this nodule in 6-12 months. Additional stable benign pulmonary nodules. Some nodules are clustered and centrilobular, for example in the anterior right upper lobe, findings consistent with atypical infection, including atypical mycobacterium.  Cardiomegaly and coronary artery disease.  HRCT 12/2015 IMPRESSION: The appearance of the chest appears very similar to the prior examination, with lung findings that are again most suggestive of mild nonspecific interstitial pneumonia (NSIP). Mild diffuse bronchial wall thickening with mild centrilobular and paraseptal emphysema. Cardiomegaly with biatrial dilatation. Aortic atherosclerosis, in addition to left main and 3 vessel coronary artery disease. Please note that although the presence of coronary artery calcium documents the presence of coronary artery disease, the severity of this disease and any potential stenosis cannot be assessed on this non-gated CT examination. Assessment for potential risk factor modification, dietary therapy or pharmacologic therapy may be warranted, if clinically indicated. Multiple tiny pulmonary nodules are again noted, stable compared to the prior study, most compatible with areas of chronic mucoid impaction within terminal bronchioles. These all measure 6 mm or less in size. Repeat  noncontrast chest CT could be considered in 12-18 months to ensure continued stability these findings.   CBC Latest Ref Rng & Units 08/02/2020 04/02/2020 02/07/2020  WBC 4.0 -  10.5 K/uL 9.2 6.6 3.8(L)  Hemoglobin 13.0 - 17.0 g/dL 12.6(L) 11.5(L) 12.2(L)  Hematocrit 39.0 - 52.0 % 40.0 34.9(L) 35.8(L)  Platelets 150 - 400 K/uL 235 183 192.0    BMP Latest Ref Rng & Units 08/02/2020 04/02/2020 02/07/2020  Glucose 70 - 99 mg/dL 124(H) 100(H) 86  BUN 8 - 23 mg/dL 17 20 21   Creatinine 0.61 - 1.24 mg/dL 0.83 0.67 0.84  BUN/Creat Ratio 10 - 24 - - -  Sodium 135 - 145 mmol/L 131(L) 138 138  Potassium 3.5 - 5.1 mmol/L 4.1 4.7 4.7  Chloride 98 - 111 mmol/L 99 103 102  CO2 22 - 32 mmol/L 24 28 30   Calcium 8.9 - 10.3 mg/dL 9.2 9.2 9.3    BNP No results found for: BNP  ProBNP    Component Value Date/Time   PROBNP 225.0 (H) 03/26/2019 0909    PFT    Component Value Date/Time   FEV1PRE 2.90 10/31/2018 0849   FEV1POST 3.05 10/31/2018 0849   FVCPRE 4.03 10/31/2018 0849   FVCPOST 4.15 10/31/2018 0849   TLC 6.40 10/31/2018 0849   DLCOUNC 17.98 10/31/2018 0849   PREFEV1FVCRT 72 10/31/2018 0849   PSTFEV1FVCRT 74 10/31/2018 0849    CT ABDOMEN PELVIS W CONTRAST  Result Date: 08/02/2020 CLINICAL DATA:  Diverticulitis suspected. Left-sided abdominal pain. Pain goes from the ribcage down to groin area. Nausea and vomiting. Difficulty urinating. EXAM: CT ABDOMEN AND PELVIS WITH CONTRAST TECHNIQUE: Multidetector CT imaging of the abdomen and pelvis was performed using the standard protocol following bolus administration of intravenous contrast. CONTRAST:  156mL OMNIPAQUE IOHEXOL 300 MG/ML  SOLN COMPARISON:  CT chest 10/21/2019 FINDINGS: Lower chest: Similar-appearing right middle lobe cystic changes that are partially visualized. Interval development of a nodular-like 5 mm subpleural subsolid density (5:2). Linear atelectasis versus scarring within bilateral lower lobes. Hepatobiliary: No focal liver  abnormality. No gallstones, gallbladder wall thickening, or pericholecystic fluid. No biliary dilatation. Pancreas: No focal lesion. Normal pancreatic contour. No surrounding inflammatory changes. No main pancreatic ductal dilatation. Spleen: Normal in size without focal abnormality. Adrenals/Urinary Tract: No adrenal nodule bilaterally. Delayed left nephrogram and left perinephric stranding. Couple of punctate calcified stones within the left kidney are noted. No right nephrolithiasis. Fullness of the left collecting system with no frank hydronephrosis. Mild left hydroureter. Question soft tissue density at the left ureterovesicular junction (7:66, 6:43). No right hydronephrosis. No right hydroureter. No ureterolithiasis bilaterally. The urinary bladder is unremarkable. Stomach/Bowel: Stomach is within normal limits. No evidence of bowel wall thickening or dilatation. The appendix not definitely identified. Vascular/Lymphatic: No abdominal aorta or iliac aneurysm. Moderate calcified and noncalcified atherosclerotic plaque of the aorta and its branches. No abdominal, pelvic, or inguinal lymphadenopathy. Reproductive: Prostate is unremarkable. Other: No intraperitoneal free fluid. No intraperitoneal free gas. No organized fluid collection. Musculoskeletal: No abdominal wall hernia or abnormality. No suspicious lytic or blastic osseous lesions. No acute displaced fracture. Interval disc space vacuum phenomenon. Retrolisthesis of L5 on S1 with no severe osseous central canal stenosis. IMPRESSION: 1. Delayed left nephrogram with mild fullness of the left collecting system and question of soft tissue density lesion at the left ureterovesicular junction. Recommend urologic consultation. 2. Nonobstructive punctate left nephrolithiasis. 3. Interval development of a nodular-like 5 mm subpleural subsolid density. No follow-up recommended. This recommendation follows the consensus statement: Guidelines for Management of  Incidental Pulmonary Nodules Detected on CT Images: From the Fleischner Society 2017; Radiology 2017; 284:228-243. 4.  Aortic Atherosclerosis (ICD10-I70.0). Electronically Signed   By: Thomasena Edis  Mckinley Jewel M.D.   On: 08/02/2020 15:19   DG C-Arm 1-60 Min-No Report  Result Date: 08/05/2020 Fluoroscopy was utilized by the requesting physician.  No radiographic interpretation.     Past medical hx Past Medical History:  Diagnosis Date  . Anemia    mild   . Arthritis    hands, lower back  . Bronchiectasis ([place])   . CAD (coronary artery disease), native coronary artery    a. 08/2015 Cath/PCI: LM nl, LAD 23m, LCX small, nl, RCA 80p (4.0x16 Synergy DES),   . 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 Cataracts, bilateral    surgery  . Depression    pt denies   . Diverticulosis   . Dyspnea    occasional   . Dysrhythmia     Afib  . GERD (gastroesophageal reflux disease)   . Heart murmur   . Hilar adenopathy    a. 08/2015 CT Chest: mild bilat hilar adenopathy and mild soft tissue prominence in inf right hilum, left hilum. Also areas of soft tissue and low-density material filling LLL bronchial airways->? mucous vs mass.  [name] Hx of adenomatous colonic polyps 08/13/2015  . Hyperlipidemia   . MVP (mitral valve prolapse)    bileaflet with mild to moderate MR  . Myocardial infarction ([place])   . Nonspecific elevation of levels of transaminase or lactic acid dehydrogenase (LDH)    PMH of ; ? due to Amiodarone   . 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 Permanent atrial fibrillation ([facility])    a. CHA2DS2VASc = 2 -->Xarelto started 08/2015.  [name] Pneumonia, bacterial 05/18/2012  . Pulmonary HTN ([facility])    PASP 74mmHg on echo 08/2019>>Likely Group 3 related to bronchiectasis  . Pulmonary nodules    a. 08/2015 CT Chest: reticulonodular opacities in RUL - largest 28mm - likely 2/2 inflammatory process.  . Stroke ([place])   . Tricuspid regurgitation      Social History   Tobacco Use  . Smoking status: Never Smoker  .  Smokeless tobacco: Former Systems developer    Types: Chew  . Tobacco comment: occasional cigar in past; not regular smoker  Vaping Use  . Vaping Use: Never used  Substance Use Topics  . Alcohol use: Yes    Comment: 5 beers per week   . Drug use: No    Mr.Kadel reports that he has never smoked. He quit smokeless tobacco use about 42 years ago.  His smokeless tobacco use included chew. He reports current alcohol use. He reports that he does not use drugs.  Tobacco Cessation: Counseling given: Yes Comment: occasional cigar in past; not regular smoker   Past surgical hx, Family hx, Social hx all reviewed.  Current Outpatient Medications on File Prior to Visit  Medication Sig  . atorvastatin (LIPITOR) 80 MG tablet Take 1 tablet (80 mg total) by mouth daily.  . Cholecalciferol (VITAMIN D3) 2000 UNITS TABS Take 2,000 Units by mouth daily.   . famotidine (PEPCID) 20 MG tablet Take 20 mg by mouth daily as needed for heartburn or indigestion.  . furosemide (LASIX) 20 MG tablet Take 20 mg by mouth daily as needed for fluid or edema.   . Glucosamine-Chondroitin (MOVE FREE PO) Take 1 tablet by mouth daily.  . Guaifenesin (MUCINEX MAXIMUM STRENGTH) 1200 MG TB12 Take 1,200 mg by mouth at bedtime.  . hydrocortisone 2.5 % ointment Apply 1 application topically 2 (two) times daily as needed (irritation).   . Melatonin 5 MG CAPS Take 5 mg by mouth  at bedtime.  . Multiple Vitamin (MULTIVITAMIN) tablet Take 1 tablet by mouth daily.  . nitroGLYCERIN (NITROSTAT) 0.4 MG SL tablet Place 1 tablet (0.4 mg total) under the tongue every 5 (five) minutes x 3 doses as needed for chest pain.  . phenazopyridine (PYRIDIUM) 200 MG tablet Take 1 tablet (200 mg total) by mouth 3 (three) times daily as needed for pain (for burning).  [name] Respiratory Therapy Supplies (FLUTTER) DEVI Use as directed  . rivaroxaban (XARELTO) 20 MG TABS tablet TAKE ONE TABLET BY MOUTH ONE TIME DAILY with supper (Patient taking differently: Take 20 mg  by mouth daily.)  . tiZANidine (ZANAFLEX) 2 MG tablet Take 2 mg by mouth daily.  [name] triamcinolone cream (KENALOG) 0.1 % Apply 1 application topically daily as needed (rash).    Current Facility-Administered Medications on File Prior to Visit  Medication  . gemcitabine (GEMZAR) chemo syringe for bladder instillation 2,000 mg     Allergies  Allergen Reactions  . Amiodarone Other (See Comments)    elevated liver enzymes  . Black Pepper-Turmeric Other (See Comments)    heartburn  . Metoprolol Other (See Comments)    Hypotension     Review Of Systems:  Constitutional:   No  weight loss, night sweats,  Fevers, chills, fatigue, or  lassitude.  HEENT:   No headaches,  Difficulty swallowing,  Tooth/dental problems, or  Sore throat,                No sneezing, itching, ear ache, nasal congestion, post nasal drip,   CV:  No chest pain,  Orthopnea, PND, swelling in lower extremities, anasarca, dizziness, palpitations, syncope.   GI  No heartburn, indigestion, abdominal pain, nausea, vomiting, diarrhea, change in bowel habits, loss of appetite, bloody stools.   Resp: + shortness of breath with exertion or at rest.  No excess mucus, no productive cough,  No non-productive cough,  No coughing up of blood.  No change in color of mucus.  No wheezing.  No chest wall deformity  Skin: no rash or lesions.  GU: no dysuria, change in color of urine, no urgency or frequency.  No flank pain, no hematuria   MS:  No joint pain or swelling.  No decreased range of motion.  No back pain.  Psych:  No change in mood or affect. No depression or anxiety.  No memory loss.   Vital Signs BP 112/72 (BP Location: Left Arm, Cuff Size: Normal)   Pulse 65   Temp (!) 97.2 F (36.2 C) (Oral)   Ht 6\' 3"  (1.905 m)   Wt 157 lb 6.4 oz (71.4 kg)   SpO2 99%   BMI 19.67 kg/m    Physical Exam:  General- No distress,  A&Ox3, pleasant ENT: No sinus tenderness, TM clear, pale nasal mucosa, no oral exudate,no post  nasal drip, no LAN Cardiac: S1, S2, regular rate and rhythm, no murmur Chest: No wheeze/ rales/ dullness; no accessory muscle use, no nasal flaring, no sternal retractions, few crackles per bases.  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 Valeta Harms 11/2020 after HRCT. If worsening, refer to Dr. Silas Flood  ILD Plan We will plan a HRCT 11/2020 to monitor the pulmonary Fibrosis. We will do a follow up PFT if there is progression of ILD on imaging. Work on increasing your activity as able.  Consider 6 minute walk to monitor for progression of ILD Pulmonary rehab offered again today, patient declined Follow up with Dr. Valeta Harms or [name] NP  in July after follow up CT Chest BD ( Xopenex) for prn use  Bronchiectasis Stable Interval Plan Continue Mucinex daily Continue Flutter valve daily Call for any change in secretions to allow [place] to be proactive to flares  Physical Deconditioning Plan Continue to increase activity as able  [place] as we discussed prior to your trip to [place] in September.  [name], NP 08/17/2020  9:30 AM

## 2020-08-21 DIAGNOSIS — M25512 Pain in left shoulder: Secondary | ICD-10-CM | POA: Diagnosis not present

## 2020-08-21 DIAGNOSIS — M25612 Stiffness of left shoulder, not elsewhere classified: Secondary | ICD-10-CM | POA: Diagnosis not present

## 2020-08-21 DIAGNOSIS — M75122 Complete rotator cuff tear or rupture of left shoulder, not specified as traumatic: Secondary | ICD-10-CM | POA: Diagnosis not present

## 2020-08-21 DIAGNOSIS — M6281 Muscle weakness (generalized): Secondary | ICD-10-CM | POA: Diagnosis not present

## 2020-08-24 DIAGNOSIS — M25512 Pain in left shoulder: Secondary | ICD-10-CM | POA: Diagnosis not present

## 2020-08-24 DIAGNOSIS — M75122 Complete rotator cuff tear or rupture of left shoulder, not specified as traumatic: Secondary | ICD-10-CM | POA: Diagnosis not present

## 2020-08-24 DIAGNOSIS — M6281 Muscle weakness (generalized): Secondary | ICD-10-CM | POA: Diagnosis not present

## 2020-08-24 DIAGNOSIS — M25612 Stiffness of left shoulder, not elsewhere classified: Secondary | ICD-10-CM | POA: Diagnosis not present

## 2020-08-27 DIAGNOSIS — M25512 Pain in left shoulder: Secondary | ICD-10-CM | POA: Diagnosis not present

## 2020-08-27 DIAGNOSIS — M75122 Complete rotator cuff tear or rupture of left shoulder, not specified as traumatic: Secondary | ICD-10-CM | POA: Diagnosis not present

## 2020-08-27 DIAGNOSIS — M25612 Stiffness of left shoulder, not elsewhere classified: Secondary | ICD-10-CM | POA: Diagnosis not present

## 2020-08-27 DIAGNOSIS — M6281 Muscle weakness (generalized): Secondary | ICD-10-CM | POA: Diagnosis not present

## 2020-09-02 DIAGNOSIS — M25612 Stiffness of left shoulder, not elsewhere classified: Secondary | ICD-10-CM | POA: Diagnosis not present

## 2020-09-02 DIAGNOSIS — M6281 Muscle weakness (generalized): Secondary | ICD-10-CM | POA: Diagnosis not present

## 2020-09-02 DIAGNOSIS — M25512 Pain in left shoulder: Secondary | ICD-10-CM | POA: Diagnosis not present

## 2020-09-02 DIAGNOSIS — M75122 Complete rotator cuff tear or rupture of left shoulder, not specified as traumatic: Secondary | ICD-10-CM | POA: Diagnosis not present

## 2020-09-07 ENCOUNTER — Other Ambulatory Visit (HOSPITAL_COMMUNITY): Payer: PPO

## 2020-09-07 NOTE — Telephone Encounter (Signed)
Received a message from patient stating that his ECHO has been rescheduled from 09/07/20 to 09/22/20. He wanted to know [name] know. Will send to [name] as a FYI.

## 2020-09-08 DIAGNOSIS — M25612 Stiffness of left shoulder, not elsewhere classified: Secondary | ICD-10-CM | POA: Diagnosis not present

## 2020-09-08 DIAGNOSIS — M7522 Bicipital tendinitis, left shoulder: Secondary | ICD-10-CM | POA: Diagnosis not present

## 2020-09-08 DIAGNOSIS — M75122 Complete rotator cuff tear or rupture of left shoulder, not specified as traumatic: Secondary | ICD-10-CM | POA: Diagnosis not present

## 2020-09-08 DIAGNOSIS — M6281 Muscle weakness (generalized): Secondary | ICD-10-CM | POA: Diagnosis not present

## 2020-09-10 DIAGNOSIS — M75122 Complete rotator cuff tear or rupture of left shoulder, not specified as traumatic: Secondary | ICD-10-CM | POA: Diagnosis not present

## 2020-09-10 DIAGNOSIS — M25612 Stiffness of left shoulder, not elsewhere classified: Secondary | ICD-10-CM | POA: Diagnosis not present

## 2020-09-10 DIAGNOSIS — M6281 Muscle weakness (generalized): Secondary | ICD-10-CM | POA: Diagnosis not present

## 2020-09-10 DIAGNOSIS — M7522 Bicipital tendinitis, left shoulder: Secondary | ICD-10-CM | POA: Diagnosis not present

## 2020-09-14 DIAGNOSIS — M7522 Bicipital tendinitis, left shoulder: Secondary | ICD-10-CM | POA: Diagnosis not present

## 2020-09-14 DIAGNOSIS — M75122 Complete rotator cuff tear or rupture of left shoulder, not specified as traumatic: Secondary | ICD-10-CM | POA: Diagnosis not present

## 2020-09-14 DIAGNOSIS — M6281 Muscle weakness (generalized): Secondary | ICD-10-CM | POA: Diagnosis not present

## 2020-09-14 DIAGNOSIS — M25612 Stiffness of left shoulder, not elsewhere classified: Secondary | ICD-10-CM | POA: Diagnosis not present

## 2020-09-17 DIAGNOSIS — M25512 Pain in left shoulder: Secondary | ICD-10-CM | POA: Diagnosis not present

## 2020-09-17 DIAGNOSIS — M25612 Stiffness of left shoulder, not elsewhere classified: Secondary | ICD-10-CM | POA: Diagnosis not present

## 2020-09-17 DIAGNOSIS — M6281 Muscle weakness (generalized): Secondary | ICD-10-CM | POA: Diagnosis not present

## 2020-09-17 DIAGNOSIS — M75122 Complete rotator cuff tear or rupture of left shoulder, not specified as traumatic: Secondary | ICD-10-CM | POA: Diagnosis not present

## 2020-09-22 ENCOUNTER — Ambulatory Visit (HOSPITAL_COMMUNITY): Payer: PPO | Attending: Cardiology

## 2020-09-22 ENCOUNTER — Other Ambulatory Visit: Payer: Self-pay

## 2020-09-22 ENCOUNTER — Encounter: Payer: Self-pay | Admitting: Cardiology

## 2020-09-22 DIAGNOSIS — M75122 Complete rotator cuff tear or rupture of left shoulder, not specified as traumatic: Secondary | ICD-10-CM | POA: Diagnosis not present

## 2020-09-22 DIAGNOSIS — I77819 Aortic ectasia, unspecified site: Secondary | ICD-10-CM | POA: Insufficient documentation

## 2020-09-22 DIAGNOSIS — I272 Pulmonary hypertension, unspecified: Secondary | ICD-10-CM | POA: Diagnosis not present

## 2020-09-22 DIAGNOSIS — M6281 Muscle weakness (generalized): Secondary | ICD-10-CM | POA: Diagnosis not present

## 2020-09-22 DIAGNOSIS — I080 Rheumatic disorders of both mitral and aortic valves: Secondary | ICD-10-CM | POA: Diagnosis not present

## 2020-09-22 DIAGNOSIS — M25612 Stiffness of left shoulder, not elsewhere classified: Secondary | ICD-10-CM | POA: Diagnosis not present

## 2020-09-22 DIAGNOSIS — M7522 Bicipital tendinitis, left shoulder: Secondary | ICD-10-CM | POA: Diagnosis not present

## 2020-09-22 LAB — ECHOCARDIOGRAM COMPLETE
MV M vel: 6.25 m/s
MV Peak grad: 156.3 mmHg
S' Lateral: 2.5 cm

## 2020-09-23 ENCOUNTER — Telehealth: Payer: Self-pay | Admitting: Pulmonary Disease

## 2020-09-23 ENCOUNTER — Telehealth: Payer: Self-pay | Admitting: Cardiology

## 2020-09-23 ENCOUNTER — Telehealth: Payer: Self-pay

## 2020-09-23 DIAGNOSIS — I272 Pulmonary hypertension, unspecified: Secondary | ICD-10-CM

## 2020-09-23 DIAGNOSIS — I080 Rheumatic disorders of both mitral and aortic valves: Secondary | ICD-10-CM

## 2020-09-23 MED ORDER — LEVOFLOXACIN 750 MG PO TABS: 750.0000 mg | ORAL_TABLET | Freq: Every day | ORAL | 0 refills | Status: DC

## 2020-09-23 MED ORDER — NIRMATRELVIR/RITONAVIR (PAXLOVID)TABLET: 2.0000 | ORAL_TABLET | Freq: Two times a day (BID) | ORAL | 0 refills | Status: DC

## 2020-09-23 MED ORDER — NIRMATRELVIR/RITONAVIR (PAXLOVID)TABLET: 2.0000 | ORAL_TABLET | Freq: Two times a day (BID) | ORAL | 0 refills | Status: AC

## 2020-09-23 NOTE — Telephone Encounter (Signed)
Okay to keep appointment for Monday.

## 2020-09-23 NOTE — Telephone Encounter (Signed)
Previous patient of Troy Willis.  Has seen Troy Willis once, and more recently followed by Troy Willis.  Has history of bronchiectasis.  Has been coughing up more phlegm with brown color for past few weeks.  Has been getting soreness in chest and feeling fatigued.  Typically uses levaquin when he has an exacerbation, but hasn't been on antibiotics recently.  He was visiting a friend over the weekend who then tested positive for COVID.  Troy Willis noticed his symptoms getting worse for the past couple of days and did COVID home test which came back positive.  He has temperature 100.32F this morning.  Will give him course of levaquin 750 mg daily for 7 days to treat bronchiectasis exacerbation.  He is high risk for progressing to severe disease from COVID 19 infection.  Will send in script for paxlovid for 5 days.  Advised him to not use lipitor for the next five days while he is on paxlovid.  Advised that he shouldn't take xarelto while on paxlovid also, and that he could use aspirin 325 mg daily for next five days until he completes course of paxlovid.  He will check with his cardiologist to make sure this plan is okay.   Will have Triage staff contact him to arrange for tele visit with Troy Willis or Troy Willis later this week.

## 2020-09-23 NOTE — Telephone Encounter (Signed)
The patient has been notified of the result and verbalized understanding.  All questions (if any) were answered. Antonieta Iba, RN 09/23/2020 12:35 PM

## 2020-09-23 NOTE — Telephone Encounter (Signed)
Pt aware ok to keep appt he already has scheduled with [name] for 09/28/20 at 11:30  He requested that we send his rx for paxlovid to [facility] b/c he says [facility] does not carry this Rx sent  Nothing further needed

## 2020-09-23 NOTE — Telephone Encounter (Signed)
Pt c/o medication issue:  1. Name of Medication:  atorvastatin (LIPITOR) 80 MG tablet rivaroxaban (XARELTO) 20 MG TABS tablet  2. How are you currently taking this medication (dosage and times per day)? As prescribed  3. Are you having a reaction (difficulty breathing--STAT)?  No   4. What is your medication issue?   Patient states he woke up with COVID and called his pulmonologist. His pulmonologist advised that he hold his Lipitor and Xarelto for 5 days while taking Paxlovid and Levaquin. He would like to ensure that Dr. Radford Pax agrees with this recommendation. Please advise.

## 2020-09-23 NOTE — Telephone Encounter (Signed)
Patient with CHADsVASc of 4.  Significant drug interactions between Paxlovid and Xarelto and atorvastatin.  Ok to hold Xarelto and atorvastatin while undergoing Covid treatment but to immediately restart Xarelto once treatment is complete

## 2020-09-23 NOTE — Telephone Encounter (Signed)
See other phone note from 09/23/20. Will close encounter.

## 2020-09-23 NOTE — Telephone Encounter (Signed)
Patient aware to proceed with recommendations to hold atorvastatin and xarelto until COVID treatment completed.

## 2020-09-23 NOTE — Telephone Encounter (Signed)
-----   Message from Sueanne Margarita, MD sent at 09/22/2020  5:01 PM EDT ----- Echo showed normal heart function with mildly enlarged RV, mild pulmonary HTN, severe biatrial enlargement, moderate MR and TR and mildly dilated ascending aorta at 42mm.  Compared to prior echo, the PASP has decreased from 82mmHg to 62mmHg and MR is now moderate.  Repeat echo in 1 year for MR

## 2020-09-23 NOTE — Telephone Encounter (Signed)
Patient advised to hold his atorvastatin and Xarelto while he is being treated for COVID. He was advised to take ASA 325 mg for the 5 days that he is off of ELiquis

## 2020-09-23 NOTE — Telephone Encounter (Signed)
Called and spoke to pt. Reiterated the information per Dr. Halford Chessman. Pt verbalized understanding and states he has already reached out to his cardiologist and is waiting to hear back from them. There are no current openings later this week with [name] nor Dr. Valeta Harms. I have scheduled pt for Monday 09/28/2020 with Eric Form, NP. Pt aware to call our office on Friday if he is feeling worse or no improvement since his follow up is on Monday. (No need to call pt back if appt can be kept for Monday)   Dr. Halford Chessman, please advise if ok to keep appt on Monday. Pt aware to call on Friday if no improvement or is feeling worse.

## 2020-09-25 ENCOUNTER — Telehealth: Payer: Self-pay | Admitting: Acute Care

## 2020-09-25 NOTE — Telephone Encounter (Signed)
Call returned to patient, confirmed DOB. Patient states he took his first dose of levaquin yesterday and he developed diarrhea. I made patient aware diarrhea is a common side effect of antibiotics. He is eating before taking and would like to know if he can take a probiotics well or will this make his diarrhea worse. Patient states he is currently COVID positive and he is also taking paxlovid as well.   VS please advise. Thanks :)

## 2020-09-25 NOTE — Telephone Encounter (Signed)
Diarrhea could be from antibiotic or from COVID infection itself.  Okay for him to use a probiotic while on levaquin and COVID medication.

## 2020-09-25 NOTE — Telephone Encounter (Signed)
Called and spoke with patient to let him know the recs of Dr. Halford Chessman. He expressed understanding. Advised patient that if it continued or he got worse to let us know. Confirmed Televisit with patient for Monday. Nothing further needed at this time.

## 2020-09-28 ENCOUNTER — Encounter: Payer: Self-pay | Admitting: Acute Care

## 2020-09-28 ENCOUNTER — Ambulatory Visit: Payer: PPO

## 2020-09-28 ENCOUNTER — Ambulatory Visit (INDEPENDENT_AMBULATORY_CARE_PROVIDER_SITE_OTHER): Payer: PPO | Admitting: Acute Care

## 2020-09-28 ENCOUNTER — Other Ambulatory Visit: Payer: Self-pay

## 2020-09-28 DIAGNOSIS — U071 COVID-19: Secondary | ICD-10-CM | POA: Diagnosis not present

## 2020-09-28 DIAGNOSIS — R059 Cough, unspecified: Secondary | ICD-10-CM

## 2020-09-28 DIAGNOSIS — R0600 Dyspnea, unspecified: Secondary | ICD-10-CM

## 2020-09-28 NOTE — Progress Notes (Signed)
Virtual Visit via Telephone Note  I connected with Troy Willis on 09/28/20 at 11:30 AM EDT by telephone and verified that I am speaking with the correct person using two identifiers.  Location: Patient: At home Provider: [place], [place], [place], [address]   I discussed the limitations, risks, security and privacy concerns of performing an evaluation and management service by telephone and the availability of in person appointments. I also discussed with the patient that there may be a patient responsible charge related to this service. The patient expressed understanding and agreed to proceed.   History of Present Illness: Pt. Presents for follow up. He was diagnosed with Covid  on 5/11 after exposure to a friend who was also Covid +. He called the office and was placed on Levaquin 750 mg daily x 7 days,  Paxlovid x 5 days, and aspirin 325 mg daily while on Paxlovid  per Dr. Halford Chessman . Pt held atorvastatin and xarelto until after treatment  per Dr. Tressia Miners Turner's recommendations. ( Cardiology).He did experience diarrhea while on Levaquin. He was started on Probiotics 09/25/2020. He is scheduled for a Televisit today as follow up to check on his condition.  Pt. States he has been doing better. He has finished the Paxlovid 09/28/2020 and will finish Levaquin 5/17. Diarrhea stopped after the second day of treatment. He does complain of some muscle cramps. No issues with breathing or coughing. No fever since day 2. He does sound congested over the phone. He did initially have brown mucus, which has cleared and is now much less in amount. He feels he is slowly improving. He has felt weak and shaky at times. But feels better today. He does feel he has had some weight loss. We will weigh at follow up .    Observations/Objective: 09/23/2020 Home Test + for Covid  Assessment and Plan: Improving Covid 19 Symptoms Diagnosis date 5/11 Completed 5 days of Paxlovid 5/16 Last Day of Levaquin  5/17 Plan Complete your Levaquin tomorrow. Continue taking your Probiotics for 2 weeks after you complete your antibiotics Please call your Cardiologist today and ask when you should resume Xaralto and Statin medications.  Follow up 10/14/2020 at 3 pm CXR prior Call to be seen sooner if you develop any symptoms of pneumonia, fever, discolored sputum, or shortness of breath.  Please contact office for sooner follow up if symptoms do not improve or worsen or seek emergency care    Follow Up Instructions: Follow up Follow up 10/14/2020 at 3 pm Call sooner for any symptoms of pneumonia or shortness of breath     I discussed the assessment and treatment plan with the patient. The patient was provided an opportunity to ask questions and all were answered. The patient agreed with the plan and demonstrated an understanding of the instructions.   The patient was advised to call back or seek an in-person evaluation if the symptoms worsen or if the condition fails to improve as anticipated.  I provided 45  minutes of non-face-to-face time during this encounter.  dedicated to the care of this patient on the date of this encounter to include pre-visit review of records, Non face-to-face time with the patient discussing conditions above,  clinical documentation with the electronic health record, making appropriate referrals as documented, and communicating necessary information to the healthcare management team.    [name], NP 09/28/2020

## 2020-09-28 NOTE — Patient Instructions (Addendum)
It is good to talk to you today. Complete your Levaquin tomorrow. Continue taking your Probiotics for 2 weeks after you complete your antibiotics Please call your Cardiologist today and ask when you should resume Xaralto and Statin medications.  Follow up 10/14/2020 at 3 pm CXR prior Call to be seen sooner if you develop any symptoms of pneumonia, fever, discolored sputum, or shortness of breath.  Please contact office for sooner follow up if symptoms do not improve or worsen or seek emergency care

## 2020-09-28 NOTE — Addendum Note (Signed)
Addended byCoralie Keens on: 09/28/2020 02:52 PM   Modules accepted: Orders

## 2020-09-28 NOTE — Progress Notes (Signed)
Reviewed and agree with assessment/plan.   Chesley Mires, MD [facility] Pulmonary/Critical Care 09/28/2020, 3:15 PM Pager:  [phone]

## 2020-10-05 DIAGNOSIS — M25612 Stiffness of left shoulder, not elsewhere classified: Secondary | ICD-10-CM | POA: Diagnosis not present

## 2020-10-05 DIAGNOSIS — M25512 Pain in left shoulder: Secondary | ICD-10-CM | POA: Diagnosis not present

## 2020-10-05 DIAGNOSIS — M75122 Complete rotator cuff tear or rupture of left shoulder, not specified as traumatic: Secondary | ICD-10-CM | POA: Diagnosis not present

## 2020-10-05 DIAGNOSIS — M6281 Muscle weakness (generalized): Secondary | ICD-10-CM | POA: Diagnosis not present

## 2020-10-14 ENCOUNTER — Other Ambulatory Visit: Payer: Self-pay

## 2020-10-14 ENCOUNTER — Encounter: Payer: Self-pay | Admitting: Acute Care

## 2020-10-14 ENCOUNTER — Ambulatory Visit: Payer: PPO | Admitting: Acute Care

## 2020-10-14 ENCOUNTER — Other Ambulatory Visit: Payer: Self-pay | Admitting: Acute Care

## 2020-10-14 ENCOUNTER — Ambulatory Visit (INDEPENDENT_AMBULATORY_CARE_PROVIDER_SITE_OTHER): Payer: PPO

## 2020-10-14 VITALS — BP 108/62 | HR 88 | Temp 98.3°F | Ht 75.0 in | Wt 160.8 lb

## 2020-10-14 DIAGNOSIS — J1282 Pneumonia due to coronavirus disease 2019: Secondary | ICD-10-CM

## 2020-10-14 DIAGNOSIS — R0600 Dyspnea, unspecified: Secondary | ICD-10-CM | POA: Diagnosis not present

## 2020-10-14 DIAGNOSIS — U071 COVID-19: Secondary | ICD-10-CM

## 2020-10-14 DIAGNOSIS — J849 Interstitial pulmonary disease, unspecified: Secondary | ICD-10-CM | POA: Diagnosis not present

## 2020-10-14 DIAGNOSIS — R0602 Shortness of breath: Secondary | ICD-10-CM | POA: Diagnosis not present

## 2020-10-14 DIAGNOSIS — J479 Bronchiectasis, uncomplicated: Secondary | ICD-10-CM

## 2020-10-14 DIAGNOSIS — R059 Cough, unspecified: Secondary | ICD-10-CM

## 2020-10-14 DIAGNOSIS — I272 Pulmonary hypertension, unspecified: Secondary | ICD-10-CM | POA: Diagnosis not present

## 2020-10-14 NOTE — Patient Instructions (Addendum)
It is good to see you today I am glad you are feeling better. CXR today was clear. Continue low salt diet. Continue taking lasix as needed for lower extremity swelling.  Follow up HRCT 11/2020 Follow up with [name] NP as is already scheduled 11/23/20 Continue Mucinex daily Continue Flutter valve daily Call for any change in secretions to allow [place] to be proactive to flares Increase activity as able. Let me know if you would like a referral to Pulmonary Rehab Covid Booster 12/2020 prior top trip to [place]. Please contact office for sooner follow up if symptoms do not improve or worsen or seek emergency care

## 2020-10-14 NOTE — Progress Notes (Signed)
History of Present Illness Troy Willis is a 75 y.o. 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 Marland KitchenHe is followed by Dr. Valeta Harms   Pt. Was diagnosed with Covid Pneumonia 5/11. He was treated with Paxlovid and Levaquin. He is here for follow up to assure he has improved.   Additionally 07/2020 CT Abdomen and pelvis was + for a 5 mm subpleural sub solid density.    09/23/2020 Home Test + for Covid Diagnosis date 5/11 Completed 5 days of Paxlovid 5/16 Last Day of Levaquin 5/17   10/14/2020  Pt. Presents for follow up. He states he is doing well. He feels he is back to his baseline. His CXR is clear, and back to baseline. He is weak which is related to decreased activity. He has started exercising again this week. HIs goal is to get back to the gym.He was offered Pulmonary rehab, but wants to continue to working on this on his own. He is walking 2 miles a day. He has had weight loss. He is working on weight gain . He does have his appetite back. He denies any fever, chest pain, orthopnea.  He has a follow up HRCT 11/2020 to evaluate for any progression of disease. If there is progression we will schedule PFT's and a 6 minute walk. He has his follow up appointment scheduled 7/11.  Test Results: 10/14/2020 CXR Chronic bilateral interstitial changes are again noted. No acute alveolar infiltrate  Alpha 1 testing 02/2016 > M/M Echo 01/2019 Left ventricular ejection fraction, by visual estimation, is 65 to 70%. The left ventricle has hyperdynamic function. Normal left ventricular size. There is no left ventricular hypertrophy. Left ventricular diastolic function could not be evaluated pattern of LV diastolic filling. Global right ventricle has normal systolic function.The right ventricular size is normal. No increase in right ventricular wall thickness. Left atrial size was moderately dilated. Right atrial size was severely dilated. The  mitral valve is normal in structure. Moderate mitral valve regurgitation. No evidence of mitral stenosis. The tricuspid valve is normal in structure. Tricuspid valve regurgitation severe. The aortic valve is normal in structure. Aortic valve regurgitation was not visualized by color flow Doppler. Structurally normal aortic valve, with no evidence of sclerosis or stenosis. The pulmonic valve was not assessed. Pulmonic valve regurgitation is not visualized by color flow Doppler. Moderately elevated pulmonary artery systolic pressure. The tricuspid regurgitant velocity is 2.75 m/s, and with an assumed right atrial pressure of 15 mmHg, the estimated right ventricular systolic pressure is moderately elevated at 45.2 mmHg. The inferior vena cava is normal in size with greater than 50% respiratory variability, suggesting right atrial pressure of 3 mmHg.  In comparison to the previous echocardiogram(s): Prior examinations were reviewed in a side by side comparison of images. When compared to the prior study from 03/03/2017 RVSP is unchanged,now 45 mmHg, previously 47 mmHg, consistent with moderate pulmonary hypertension.  Echo showed normal LVF with enlarged atrial, moderate MR, severe TR and moderate PHTN. Compared to prior echo the MR has progressed.Repeat echo in 6 monthsper Cards to re-evaluate.  6MW: 09/2016 660M,O2 saturation99% 03/2017 600M,O2 saturation100% 09/26/2018: 714 feet, O2 saturation 97%>>was wearing mask  HRCT 04/2019 IMPRESSION: The appearance of the lungs remains compatible with interstitial lung disease, with stable findings compared to prior studies, again categorized as indeterminate for usual interstitial pneumonia (UIP) per current ATS guidelines. 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 While most of the previously noted pulmonary  nodules are stable, there are some new nodules, as detailed above. The largest of these has  a mean diameter of 7 mm in the lateral segment of the right middle lobe. Non-contrast chest CT at 6 months is recommended. If the nodules are stable at time of repeat CT, then future CT at 18-24 months (from today's scan) is considered optional for low-risk patients, but is recommended for high-risk patients.  Aortic atherosclerosis, in addition to left main and 3 vessel coronary artery disease. Assessment for potential risk factor modification, dietary therapy or pharmacologic therapy may be warranted, if clinically indicated. Mild cardiomegaly with biatrial dilatation.  Aortic Atherosclerosis (ICD10-I70.0).   CBC Latest Ref Rng & Units 08/02/2020 04/02/2020 02/07/2020  WBC 4.0 - 10.5 K/uL 9.2 6.6 3.8(L)  Hemoglobin 13.0 - 17.0 g/dL 12.6(L) 11.5(L) 12.2(L)  Hematocrit 39.0 - 52.0 % 40.0 34.9(L) 35.8(L)  Platelets 150 - 400 K/uL 235 183 192.0    BMP Latest Ref Rng & Units 08/02/2020 04/02/2020 02/07/2020  Glucose 70 - 99 mg/dL 124(H) 100(H) 86  BUN 8 - 23 mg/dL 17 20 21   Creatinine 0.61 - 1.24 mg/dL 0.83 0.67 0.84  BUN/Creat Ratio 10 - 24 - - -  Sodium 135 - 145 mmol/L 131(L) 138 138  Potassium 3.5 - 5.1 mmol/L 4.1 4.7 4.7  Chloride 98 - 111 mmol/L 99 103 102  CO2 22 - 32 mmol/L 24 28 30   Calcium 8.9 - 10.3 mg/dL 9.2 9.2 9.3    BNP No results found for: BNP  ProBNP    Component Value Date/Time   PROBNP 225.0 (H) 03/26/2019 0909    PFT    Component Value Date/Time   FEV1PRE 2.90 10/31/2018 0849   FEV1POST 3.05 10/31/2018 0849   FVCPRE 4.03 10/31/2018 0849   FVCPOST 4.15 10/31/2018 0849   TLC 6.40 10/31/2018 0849   DLCOUNC 17.98 10/31/2018 0849   PREFEV1FVCRT 72 10/31/2018 0849   PSTFEV1FVCRT 74 10/31/2018 0849    DG Chest 2 View  Result Date: 10/14/2020 CLINICAL DATA:  Cough.  Shortness of breath.  Post COVID. EXAM: CHEST - 2 VIEW COMPARISON:  CT chest 10/21/2019.  Chest x-ray 09/26/2018. FINDINGS: Mediastinum and hilar structures are unremarkable. Heart size  stable. Chronic bilateral interstitial changes are again noted. No acute alveolar infiltrate. No pleural effusion or pneumothorax. No acute bony abnormality. IMPRESSION: Chronic bilateral interstitial changes are again noted. No acute alveolar infiltrate. Electronically Signed   By: Marcello Moores  Register   On: 10/14/2020 15:04   ECHOCARDIOGRAM COMPLETE  Result Date: 09/22/2020    ECHOCARDIOGRAM REPORT   Patient Name:   Troy Willis Date of Exam: 09/22/2020 Medical Rec #:  [id number]            Height:       75.0 in Accession #:    [id number]           Weight:       157.4 lb Date of Birth:  [birth year]            BSA:          1.982 m Patient Age:    66 years             BP:           112/72 mmHg Patient Gender: M                    HR:           73 bpm. Exam Location:  [facility] [address] Procedure: 2D Echo, Cardiac Doppler and Color Doppler Indications:    I27.20 Pulmonary hypertension  History:        Patient has prior history of Echocardiogram examinations, most                 recent 08/26/2019. Risk Factors:Dyslipidemia. Coronary artery                 disease. Dyspnea. Stroke. Pulmonary hypertension. Atrial                 fibrillation. Myocardial infarction. MVP. Murmur.  Sonographer:    Wilford Sports Rodgers-Jones RDCS Referring Phys: [place]  1. Left ventricular ejection fraction, by estimation, is 60 to 65%. The left ventricle has normal function. The left ventricle has no regional wall motion abnormalities. Left ventricular diastolic parameters are indeterminate.  2. Right ventricular systolic function is normal. The right ventricular size is mildly enlarged. There is mildly elevated pulmonary artery systolic pressure. The estimated right ventricular systolic pressure is 84.6 mmHg.  3. Left atrial size was severely dilated.  4. Right atrial size was severely dilated.  5. The mitral valve is abnormal. Moderate mitral valve regurgitation.  6. Tricuspid valve regurgitation is moderate.  7.  The aortic valve is tricuspid. Aortic valve regurgitation is not visualized. No aortic stenosis is present.  8. Aortic dilatation noted. There is mild dilatation of the ascending aorta, measuring 38 mm.  9. The inferior vena cava is dilated in size with >50% respiratory variability, suggesting right atrial pressure of 8 mmHg. FINDINGS  Left Ventricle: Left ventricular ejection fraction, by estimation, is 60 to 65%. The left ventricle has normal function. The left ventricle has no regional wall motion abnormalities. The left ventricular internal cavity size was normal in size. There is  no left ventricular hypertrophy. Left ventricular diastolic parameters are indeterminate. Right Ventricle: The right ventricular size is mildly enlarged. Right vetricular wall thickness was not well visualized. Right ventricular systolic function is normal. There is mildly elevated pulmonary artery systolic pressure. The tricuspid regurgitant  velocity is 2.84 m/s, and with an assumed right atrial pressure of 8 mmHg, the estimated right ventricular systolic pressure is 96.2 mmHg. Left Atrium: Left atrial size was severely dilated. Right Atrium: Right atrial size was severely dilated. Pericardium: There is no evidence of pericardial effusion. Mitral Valve: The mitral valve is abnormal. Moderate mitral valve regurgitation. Tricuspid Valve: The tricuspid valve is normal in structure. Tricuspid valve regurgitation is moderate. Aortic Valve: The aortic valve is tricuspid. Aortic valve regurgitation is not visualized. No aortic stenosis is present. Pulmonic Valve: The pulmonic valve was not well visualized. Pulmonic valve regurgitation is not visualized. Aorta: The aortic root is normal in size and structure and aortic dilatation noted. There is mild dilatation of the ascending aorta, measuring 38 mm. Venous: The inferior vena cava is dilated in size with greater than 50% respiratory variability, suggesting right atrial pressure of 8 mmHg.  IAS/Shunts: No atrial level shunt detected by color flow Doppler.  LEFT VENTRICLE PLAX 2D LVIDd:         4.10 cm LVIDs:         2.50 cm LV PW:         0.90 cm LV IVS:        0.90 cm LVOT diam:     2.00 cm LV SV:         38 LV SV Index:   19 LVOT Area:     3.14  cm  RIGHT VENTRICLE             IVC RV Basal diam:  4.10 cm     IVC diam: 2.50 cm RV S prime:     12.10 cm/s TAPSE (M-mode): 1.7 cm LEFT ATRIUM              Index       RIGHT ATRIUM           Index LA diam:        5.00 cm  2.52 cm/m  RA Area:     27.10 cm LA Vol (A2C):   119.0 ml 60.04 ml/m RA Volume:   92.30 ml  46.57 ml/m LA Vol (A4C):   87.0 ml  43.90 ml/m LA Biplane Vol: 106.0 ml 53.48 ml/m  AORTIC VALVE LVOT Vmax:   55.95 cm/s LVOT Vmean:  41.200 cm/s LVOT VTI:    0.120 m  AORTA Ao Root diam: 3.50 cm Ao Asc diam:  3.80 cm MR Peak grad: 156.2 mmHg    TRICUSPID VALVE MR Mean grad: 121.0 mmHg    TR Peak grad:   32.3 mmHg MR Vmax:      625.00 cm/s   TR Vmax:        284.00 cm/s MR Vmean:     533.0 cm/s MV E velocity: 122.00 cm/s  SHUNTS                             Systemic VTI:  0.12 m                             Systemic Diam: 2.00 cm Oswaldo Milian MD Electronically signed by Oswaldo Milian MD Signature Date/Time: 09/22/2020/10:35:00 AM    Final      Past medical hx Past Medical History:  Diagnosis Date  . Acquired dilation of ascending aorta and aortic root (HCC)    59mm ascending aorta by echo 09/2020  . Anemia    mild   . Arthritis    hands, lower back  . Bronchiectasis ([place])   . CAD (coronary artery disease), native coronary artery    a. 08/2015 Cath/PCI: LM nl, LAD 5m, LCX small, nl, RCA 80p (4.0x16 Synergy DES),   . Cancer ([place])    skin on forehead  . Cataracts, bilateral    surgery  . Depression    pt denies   . Diverticulosis   . Dyspnea    occasional   . GERD (gastroesophageal reflux disease)   . Hilar adenopathy    a. 08/2015 CT Chest: mild bilat hilar adenopathy and mild soft tissue prominence in inf  right hilum, left hilum. Also areas of soft tissue and low-density material filling LLL bronchial airways->? mucous vs mass.  [name] Hx of adenomatous colonic polyps 08/13/2015  . Hyperlipidemia   . MVP (mitral valve prolapse)    bileaflet with  moderate MR by echo 09/2020  . Myocardial infarction ([place])   . Nonspecific elevation of levels of transaminase or lactic acid dehydrogenase (LDH)    PMH of ; ? due to Amiodarone   . 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 Permanent atrial fibrillation (HCC)    a. CHA2DS2VASc = 2 -->Xarelto started 08/2015.  [name] Pneumonia, bacterial 05/18/2012  . Pulmonary HTN (HCC)    PASP 35mmHg on echo 08/2019>>Likely Group 3 related to bronchiectasis>>repeat echo 09/2020 with PASP 31mmhg  . Pulmonary nodules  a. 08/2015 CT Chest: reticulonodular opacities in RUL - largest 55mm - likely 2/2 inflammatory process.  . Stroke ([place])   . Tricuspid regurgitation      Social History   Tobacco Use  . Smoking status: Never Smoker  . Smokeless tobacco: Former Systems developer    Types: Chew  . Tobacco comment: occasional cigar in past; not regular smoker  Vaping Use  . Vaping Use: Never used  Substance Use Topics  . Alcohol use: Yes    Comment: 5 beers per week   . Drug use: No    Troy Willis reports that he has never smoked. He quit smokeless tobacco use about 42 years ago.  His smokeless tobacco use included chew. He reports current alcohol use. He reports that he does not use drugs.  Tobacco Cessation: Never smoker  Past surgical hx, Family hx, Social hx all reviewed.  Current Outpatient Medications on File Prior to Visit  Medication Sig  . atorvastatin (LIPITOR) 80 MG tablet Take 1 tablet (80 mg total) by mouth daily.  . Cholecalciferol (VITAMIN D3) 2000 UNITS TABS Take 2,000 Units by mouth daily.   . famotidine (PEPCID) 20 MG tablet Take 20 mg by mouth daily as needed for heartburn or indigestion.  . furosemide (LASIX) 20 MG tablet Take 20 mg by mouth daily as needed for fluid  or edema.   . Glucosamine-Chondroitin (MOVE FREE PO) Take 1 tablet by mouth daily.  . Guaifenesin (MUCINEX MAXIMUM STRENGTH) 1200 MG TB12 Take 1,200 mg by mouth at bedtime.  . hydrocortisone 2.5 % ointment Apply 1 application topically 2 (two) times daily as needed (irritation).   . Melatonin 5 MG CAPS Take 5 mg by mouth at bedtime.  . Multiple Vitamin (MULTIVITAMIN) tablet Take 1 tablet by mouth daily.  . nitroGLYCERIN (NITROSTAT) 0.4 MG SL tablet Place 1 tablet (0.4 mg total) under the tongue every 5 (five) minutes x 3 doses as needed for chest pain.  . phenazopyridine (PYRIDIUM) 200 MG tablet Take 1 tablet (200 mg total) by mouth 3 (three) times daily as needed for pain (for burning).  [name] Respiratory Therapy Supplies (FLUTTER) DEVI Use as directed  . rivaroxaban (XARELTO) 20 MG TABS tablet TAKE ONE TABLET BY MOUTH ONE TIME DAILY with supper (Patient taking differently: Take 20 mg by mouth daily.)  . tiZANidine (ZANAFLEX) 2 MG tablet Take 2 mg by mouth daily.  [name] triamcinolone cream (KENALOG) 0.1 % Apply 1 application topically daily as needed (rash).    Current Facility-Administered Medications on File Prior to Visit  Medication  . gemcitabine (GEMZAR) chemo syringe for bladder instillation 2,000 mg     Allergies  Allergen Reactions  . Amiodarone Other (See Comments)    elevated liver enzymes  . Black Pepper-Turmeric Other (See Comments)    heartburn  . Metoprolol Other (See Comments)    Hypotension     Review Of Systems:  Constitutional:   +  weight loss,No  night sweats,  Fevers, chills, fatigue, or  lassitude.  HEENT:   No headaches,  Difficulty swallowing,  Tooth/dental problems, or  Sore throat,                No sneezing, itching, ear ache, nasal congestion, post nasal drip,   CV:  No chest pain,  Orthopnea, PND, swelling in lower extremities, anasarca, dizziness, palpitations, syncope.   GI  No heartburn, indigestion, abdominal pain, nausea, vomiting, diarrhea, change in  bowel habits, loss of appetite, bloody stools.   Resp: No shortness of  breath with exertion or at rest.  + excess mucus, + productive cough,  No non-productive cough,  No coughing up of blood.  No change in color of mucus.  No wheezing.  No chest wall deformity  Skin: no rash or lesions.  GU: no dysuria, change in color of urine, no urgency or frequency.  No flank pain, no hematuria   MS:  No joint pain or swelling.  + decreased range of motion shoulder.  No back pain.  Psych:  No change in mood or affect. No depression or anxiety.  No memory loss.   Vital Signs BP 108/62 (BP Location: Left Arm, Cuff Size: Normal)   Pulse 88   Temp 98.3 F (36.8 C) (Oral)   Ht 6\' 3"  (1.905 m)   Wt 160 lb 12.8 oz (72.9 kg)   SpO2 97%   BMI 20.10 kg/m    Physical Exam:  General- No distress,  A&Ox3, pleasant ENT: No sinus tenderness, TM clear, pale nasal mucosa, no oral exudate,no post nasal drip, no LAN Cardiac: S1, S2, regular rate and rhythm, no murmur Chest: No wheeze/ rales/ dullness; no accessory muscle use, no nasal flaring, no sternal retractions, few crackles per bases, diminished per bases. Abd.: Soft Non-tender, ND, BS +, .Body mass index is 20.1 kg/m. 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 Valeta Harms 11/2020 after HRCT. Continue to use lasix as needed for lower extremity  swelling Continue daily weights Follow a low salt diet Follow up with cardiology as you have been doing.  If worsening, refer to Dr. Silas Flood  ILD Stable symptoms at present Plan We will plan a HRCT 11/2020 to monitor the pulmonary Fibrosis. We will do a follow up PFT if there is progression of ILD on imaging. We will get PFT's if there is progression on CT.  Work on increasing your activity as able.  Pulmonary rehab offered again today, patient declined Continue Mucinex daily Continue Flutter valve daily Call for any change in secretions to allow [place] to be proactive to flares Follow up with Dr. Valeta Harms or [name] NP  in July after follow up CT Chest BD ( Xopenex) for prn use   I spent 40 minutes dedicated to the care of this patient on the date of this encounter to include pre-visit review of records, face-to-face time with the patient discussing conditions above, post visit ordering of testing,review of CXR done today,  clinical documentation with the electronic health record, making appropriate referrals as documented, and communicating necessary information to the patient's healthcare team.   [name], NP 10/14/2020  3:47 PM

## 2020-10-15 NOTE — Progress Notes (Signed)
Reviewed and agree with assessment/plan.   Chesley Mires, MD [facility] 10/15/2020, 8:58 AM Pager:  [phone]

## 2020-10-19 ENCOUNTER — Telehealth: Payer: Self-pay | Admitting: Cardiology

## 2020-10-19 DIAGNOSIS — N200 Calculus of kidney: Secondary | ICD-10-CM | POA: Diagnosis not present

## 2020-10-19 DIAGNOSIS — R82991 Hypocitraturia: Secondary | ICD-10-CM | POA: Diagnosis not present

## 2020-10-19 DIAGNOSIS — E7800 Pure hypercholesterolemia, unspecified: Secondary | ICD-10-CM

## 2020-10-19 NOTE — Telephone Encounter (Signed)
Spoke with the patient who states that he has been having leg cramps for a long time now. He stopped his atorvastatin for a bit when he was taking some medications for COVID and leg cramping went away. Once he resumed atorvastatin leg cramping returned. He would like to know if he can try a different medication.

## 2020-10-19 NOTE — Telephone Encounter (Signed)
Pt c/o medication issue:  1. Name of Medication: atorvastatin (LIPITOR) 80 MG tablet  2. How are you currently taking this medication (dosage and times per day)? Patient did not take yesterday   3. Are you having a reaction (difficulty breathing--STAT)?   4. What is your medication issue? Patient gets nocturnal leg cramps. He was wondering if there was another medication that would not cause nocturnal leg cramps. Please advise

## 2020-10-21 NOTE — Telephone Encounter (Signed)
See MyChart message. Patient is aware and referral has been placed.

## 2020-11-03 DIAGNOSIS — R0781 Pleurodynia: Secondary | ICD-10-CM | POA: Diagnosis not present

## 2020-11-09 NOTE — Progress Notes (Signed)
Patient ID: Troy Willis                 DOB: Feb 02, 1946                    MRN: [id number]     HPI: Troy Willis is a 75 y.o. male patient referred to lipid clinic by Dr. Radford Pax. 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 Pt reported during telephone encounter on 6/8 when holding atorvastatin due to taking Paxlovid for COVID-19 management, he stopped having nocturnal leg cramps. Reported once restarting atorvastatin, leg cramps resumed and ultimately self-discontinued atorvastatin. Pt interested in alternative statin options.  Patient presents today in good spirits. Reports stopping atorvastatin about 1 month ago due to nocturnal leg cramps. Reports he has been taking atorvastatin since 2017 and has been experiencing leg cramps for couple of years now. Reports he hasn't tried any other statins in the past. Discussed diet and exercise regimen below.  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, HTN LDL goal: <70 mg/dL  Diet:  Breakfast - [employer] smoothie with apples, blueberries, walnuts, peanut butter, chocolate syrup, and yogurt; whole wheat toast with peanut butter and jelly Lunch - crackers, sandwich meat, soup, leftovers Dinner - small healthy meat, two vegetables Drinks - water, diet coke or sprite,alcohol beverage 3-4x/week  Exercise: limited due to healing of crack ribs and should cuff repair  Family History: Arthritis in his sister, sister, and sister; COPD in his father; Cancer in his mother; Colon cancer (age of onset: 33) in his sister; Coronary artery disease in an other family member; Heart attack in his brother and father; Heart disease in his brother and sister; Pulmonary fibrosis in his sister and sister; Suicidality in an other family member  Social  History:  former tobacco chewer (quit 05/16/1978); occasional cigar in the past  Labs: 02/07/20: LDL 53, TC 125, TG 60, HDL 60 (atorvastatin 80 mg daily)  Past Medical History:  Diagnosis Date   Acquired dilation of ascending aorta and aortic root (HCC)    63mm ascending aorta by echo 09/2020   Anemia    mild    Arthritis    hands, lower back   Bronchiectasis (HCC)    CAD (coronary artery disease), native coronary artery    a. 08/2015 Cath/PCI: LM nl, LAD 61m, LCX small, nl, RCA 80p (4.0x16 Synergy DES),    Cancer ([facility])    skin on forehead   Cataracts, bilateral    surgery   Depression    pt denies    Diverticulosis    Dyspnea    occasional    GERD (gastroesophageal reflux disease)    Hilar adenopathy    a. 08/2015 CT Chest: mild bilat hilar adenopathy and mild soft tissue prominence in inf right hilum, left hilum. Also areas of soft tissue and low-density material filling LLL bronchial airways->? mucous vs mass.   Hx of adenomatous colonic polyps 08/13/2015   Hyperlipidemia    MVP (mitral valve prolapse)    bileaflet with  moderate MR by echo 09/2020   Myocardial infarction [facility])    Nonspecific elevation of levels of transaminase or lactic acid dehydrogenase (LDH)    PMH of ; ? 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 CHA2DS2VASc = 2 -->Xarelto started 08/2015.   Pneumonia, bacterial 05/18/2012   Pulmonary HTN (HCC)    PASP 29mmHg on echo 08/2019>>Likely Group 3 related to bronchiectasis>>repeat echo 09/2020 with PASP 54mmhg   Pulmonary nodules    a. 08/2015 CT Chest: reticulonodular opacities in RUL - largest 74mm - likely 2/2 inflammatory process.   Stroke [facility])    Tricuspid regurgitation     Current Outpatient Medications on File Prior to Visit  Medication Sig Dispense Refill   atorvastatin (LIPITOR) 80 MG tablet Take 1 tablet (80 mg total) by mouth daily. 90 tablet 3   Cholecalciferol (VITAMIN D3) 2000 UNITS TABS Take  2,000 Units by mouth daily.      famotidine (PEPCID) 20 MG tablet Take 20 mg by mouth daily as needed for heartburn or indigestion.     furosemide (LASIX) 20 MG tablet Take 20 mg by mouth daily as needed for fluid or edema.      Glucosamine-Chondroitin (MOVE FREE PO) Take 1 tablet by mouth daily.     Guaifenesin (MUCINEX MAXIMUM STRENGTH) 1200 MG TB12 Take 1,200 mg by mouth at bedtime.     hydrocortisone 2.5 % ointment Apply 1 application topically 2 (two) times daily as needed (irritation).      Melatonin 5 MG CAPS Take 5 mg by mouth at bedtime.     Multiple Vitamin (MULTIVITAMIN) tablet Take 1 tablet by mouth daily.     nitroGLYCERIN (NITROSTAT) 0.4 MG SL tablet Place 1 tablet (0.4 mg total) under the tongue every 5 (five) minutes x 3 doses as needed for chest pain. 25 tablet 3   phenazopyridine (PYRIDIUM) 200 MG tablet Take 1 tablet (200 mg total) by mouth 3 (three) times daily as needed for pain (for burning). 10 tablet 1   Respiratory Therapy Supplies (FLUTTER) DEVI Use as directed 1 each 0   rivaroxaban (XARELTO) 20 MG TABS tablet TAKE ONE TABLET BY MOUTH ONE TIME DAILY with supper (Patient taking differently: Take 20 mg by mouth daily.) 90 tablet 3   tiZANidine (ZANAFLEX) 2 MG tablet Take 2 mg by mouth daily.     triamcinolone cream (KENALOG) 0.1 % Apply 1 application topically daily as needed (rash).      Current Facility-Administered Medications on File Prior to Visit  Medication Dose Route Frequency Provider Last Rate Last Admin   gemcitabine (GEMZAR) chemo syringe for bladder instillation 2,000 mg  2,000 mg Bladder Instillation Once Irine Seal, MD        Allergies  Allergen Reactions   Amiodarone Other (See Comments)    elevated liver enzymes   Black Pepper-Turmeric Other (See Comments)    heartburn   Metoprolol Other (See Comments)    Hypotension     Assessment/Plan:  1. Hyperlipidemia - LDL at goal <70 mg/dL on prior atorvastatin therapy, however pt not tolerating well  secondary to leg cramps. Will discontinue atorvastatin and start rosuvastatin 20 mg daily for better tolerability. Patient verbalized understanding. Encouraged patient to aim for a diet full of vegetables, fruits and lean meats (chicken, [place], fish) and to limit carbs (bread, pasta, sugar, rice) and red meat consumption. Encouraged patient to exercise 20 minutes daily with the goal of 150 minutes per week. Patient verbalized understanding. Follow-up fasting lipid panel and LFTs scheduled in 2 months.  Lorel Monaco, PharmD, [place] [address] [place], [place] [address] Phone: [phone]; Fax: [phone]  

## 2020-11-10 ENCOUNTER — Ambulatory Visit (INDEPENDENT_AMBULATORY_CARE_PROVIDER_SITE_OTHER): Payer: PPO | Admitting: Pharmacist

## 2020-11-10 ENCOUNTER — Other Ambulatory Visit: Payer: Self-pay

## 2020-11-10 DIAGNOSIS — E7800 Pure hypercholesterolemia, unspecified: Secondary | ICD-10-CM

## 2020-11-10 MED ORDER — ROSUVASTATIN CALCIUM 20 MG PO TABS: 20.0000 mg | ORAL_TABLET | Freq: Every day | ORAL | 3 refills | Status: DC

## 2020-11-10 NOTE — Patient Instructions (Signed)
Nice to see you today!  Keep up the good work with diet and exercise. Aim for a diet full of vegetables, fruit and lean meats (chicken, [place], fish). Try to limit carbs (bread, pasta, sugar, rice) and red meat consumption.  Your goal LDL is <70 mg/dL, you're currently at 54 mg/dL  Medication Changes: Begin taking rosuvastatin 20 mg daily  We have removed atorvastatin from your medication list   Please give [place] a call at [phone] with any questions or concerns.

## 2020-11-11 ENCOUNTER — Encounter: Payer: Self-pay | Admitting: *Deleted

## 2020-11-18 ENCOUNTER — Telehealth: Payer: Self-pay | Admitting: Family Medicine

## 2020-11-18 ENCOUNTER — Other Ambulatory Visit: Payer: Self-pay

## 2020-11-18 ENCOUNTER — Ambulatory Visit (INDEPENDENT_AMBULATORY_CARE_PROVIDER_SITE_OTHER)
Admission: RE | Admit: 2020-11-18 | Discharge: 2020-11-18 | Disposition: A | Discharge status: Home / Self Care | Payer: PPO | Source: Ambulatory Visit | Attending: Acute Care | Admitting: Acute Care

## 2020-11-18 DIAGNOSIS — I700 Atherosclerosis of aorta: Secondary | ICD-10-CM | POA: Diagnosis not present

## 2020-11-18 DIAGNOSIS — J849 Interstitial pulmonary disease, unspecified: Secondary | ICD-10-CM | POA: Diagnosis not present

## 2020-11-18 NOTE — Telephone Encounter (Signed)
Left message for patient to schedule Annual Wellness Visit.  Please schedule with Nurse Health Advisor Julie Greer, RN at [facility]   

## 2020-11-19 NOTE — Progress Notes (Signed)
These resulst will be discussed with patient at 11/23/2020 OV. See note for further details.

## 2020-11-23 ENCOUNTER — Encounter: Payer: Self-pay | Admitting: Acute Care

## 2020-11-23 ENCOUNTER — Other Ambulatory Visit: Payer: Self-pay

## 2020-11-23 ENCOUNTER — Telehealth: Payer: Self-pay | Admitting: Family Medicine

## 2020-11-23 ENCOUNTER — Ambulatory Visit (INDEPENDENT_AMBULATORY_CARE_PROVIDER_SITE_OTHER): Payer: PPO | Admitting: Acute Care

## 2020-11-23 DIAGNOSIS — J849 Interstitial pulmonary disease, unspecified: Secondary | ICD-10-CM | POA: Diagnosis not present

## 2020-11-23 DIAGNOSIS — U071 COVID-19: Secondary | ICD-10-CM

## 2020-11-23 DIAGNOSIS — I2721 Secondary pulmonary arterial hypertension: Secondary | ICD-10-CM | POA: Diagnosis not present

## 2020-11-23 DIAGNOSIS — S2242XS Multiple fractures of ribs, left side, sequela: Secondary | ICD-10-CM

## 2020-11-23 NOTE — Patient Instructions (Addendum)
It was great to talk with you today.  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  Work on increasing your activity as able.  Follow up in 1 month to see how you are doing after fracturing your ribs Use IS Q 1 while awake as able Tylenol for rib pain Please re-test for Covid 19 in 5 days to ensure you are negative.  If you are +, call the office and let us know.  Continue Mucinex daily Continue Flutter valve daily Call for any change in secretions to allow [place] to be proactive to flares Follow up with Dr. Valeta Harms or [name] NP  in August  after follow up CT Chest BD ( Xopenex) for  use as needed Call Dr. Radford Pax 's office and ask about a coronary calcium CT Continue Statin as you have been doing Continue to use lasix as needed for lower extremity swelling Continue daily weights Follow a low salt diet Follow up with cardiology as you have been doing.  Follow up echo per cardiology If worsening, refer to Dr. Silas Flood Follow up in 1 month to ensure you are doing well.  Please contact office for sooner follow up if symptoms do not improve or worsen or seek emergency care

## 2020-11-23 NOTE — Telephone Encounter (Signed)
Patient is currently doing telephone visit with Eric Form. Nothing further needed at this time.

## 2020-11-23 NOTE — Progress Notes (Signed)
Virtual Visit via Telephone Note  I connected with Troy Willis on 11/23/20 at  9:00 AM EDT by telephone and verified that I am speaking with the correct person using two identifiers.  Location: Patient: Home Provider: [place], [place], [place], [address]    I discussed the limitations, risks, security and privacy concerns of performing an evaluation and management service by telephone and the availability of in person appointments. I also discussed with the patient that there may be a patient responsible charge related to this service. The patient expressed understanding and agreed to proceed.  History of Present Illness Troy Willis is a 75 y.o. 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 Marland KitchenHe is followed by Dr. Valeta Harms    Pt. Was diagnosed with Covid Pneumonia 5/11. He was treated with Paxlovid and Levaquin. He was almost back to baseline when he fell putting a new boat cover on his boat. He fell 6/14 and fractured the left eighth through eleventh posterolateral ribs.  History of Present Illness: Follow up after HRCT to evaluate for progression.  Wife has Covid. Tested + Friday 7/9, Symptoms began Tuesday night. Pt. Wife is immunosuppressed on medication for arthritis. ( Methotrexate) She also had Covid in May when her husband had Covid . Pt. tested negative . I have asked the patient to re-check home Covid Test in 5-7 days. Pt. Does have broken ribs. He was covering up his boat at the [place] , 10/27/2020, and he cracked 4 ribs.He is aware that this may make him breath more shallowly. He has an IS, and he states he will use this as able. I have encouraged him to cough and deep breath.  We discussed that his HRCT. There has been no progression of disease. There was notation    Observations/Objective: 116/60, HR 83, We discussed that the increased heart rate may be due to pain from his fractured ribs.   Assessment and  Plan: Covid 19 Tested + 09/23/2020 Completed 5 days of Paxlovid 5/16 Last Day of Levaquin 5/17 Now wife has Covid. 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  Follow up echo per cardiology If worsening, refer to Dr. Silas Flood  CAD per HRCT Plan Call Dr. Radford Pax 's office and ask about a coronary calcium CT Continue Statin as you have been doing  ILD Stable without progression per HRCT Plan Work on increasing your activity as able.  Continue Mucinex daily Continue Flutter valve daily Call for any change in secretions to allow [place] to be proactive to flares Follow up with Dr. Valeta Harms or [name] NP  in August  after follow up CT Chest BD ( Xopenex) for  use as needed   Follow Up Instructions: Follow up in 1 month with [name] NP of Dr.  Edison Pace annually Wants a cardiac calcium CT.>> He is calling his cardiologist.    I discussed the assessment and treatment  plan with the patient. The patient was provided an opportunity to ask questions and all were answered. The patient agreed with the plan and demonstrated an understanding of the instructions.   The patient was advised to call back or seek an in-person evaluation if the symptoms worsen or if the condition fails to improve as anticipated.  I spent 30 minutes dedicated to the care of this patient on the date of this encounter to include pre-visit review of records, face-to-face time with the patient discussing conditions  above, post visit ordering of testing, clinical documentation with the electronic health record, making appropriate referrals as documented, and communicating necessary information to the patient's healthcare team.   I  [name], NP

## 2020-11-23 NOTE — Telephone Encounter (Signed)
Pt is requesting to transfer care to Dr. Lorelei Pont, states they are known her for a long time and that his wife and him are the parents of Dr. Lorelei Pont friend Trevonne Nyland  Daphne. States that Dr. [name] is now having a limited scheduled due to caring for her son. Both husband and wife are requesting to transfer care

## 2020-11-24 NOTE — Telephone Encounter (Signed)
Ok to schedule new patients.
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
## 2020-12-06 NOTE — Telephone Encounter (Signed)
FYI for SG:  Troy Willis "[name]"  [facility] Good morning [name],  I took a Covid test yesterday evening, 7-20 2-22, and got a solid negative result. No Covid indicated.   I have been having occasional production with coughing in the morning, but but much less than previous to taking the series of Levofloxin in late May.  Ribs are mostly good, no significant pain and I'm not pushing my luck physically, but am walking a couple of miles a day and doing light weights.   Thank you for all your help in my recent adventures!   Regards,  [name]

## 2020-12-11 ENCOUNTER — Ambulatory Visit (INDEPENDENT_AMBULATORY_CARE_PROVIDER_SITE_OTHER): Payer: PPO | Admitting: Family Medicine

## 2020-12-11 ENCOUNTER — Encounter: Payer: Self-pay | Admitting: Family Medicine

## 2020-12-11 ENCOUNTER — Other Ambulatory Visit: Payer: Self-pay

## 2020-12-11 VITALS — BP 112/60 | HR 87 | Temp 97.5°F | Ht 75.0 in | Wt 163.8 lb

## 2020-12-11 DIAGNOSIS — I4821 Permanent atrial fibrillation: Secondary | ICD-10-CM | POA: Diagnosis not present

## 2020-12-11 DIAGNOSIS — Z125 Encounter for screening for malignant neoplasm of prostate: Secondary | ICD-10-CM | POA: Diagnosis not present

## 2020-12-11 DIAGNOSIS — R634 Abnormal weight loss: Secondary | ICD-10-CM | POA: Diagnosis not present

## 2020-12-11 DIAGNOSIS — J849 Interstitial pulmonary disease, unspecified: Secondary | ICD-10-CM

## 2020-12-11 LAB — PSA: PSA: 0.34 ng/mL (ref 0.10–4.00)

## 2020-12-11 MED ORDER — LEVOFLOXACIN 750 MG PO TABS: 750.0000 mg | ORAL_TABLET | Freq: Every day | ORAL | 0 refills | Status: DC

## 2020-12-11 NOTE — Patient Instructions (Signed)
It was good to see you today!  I will be in touch with your PSA Take the levaquin rx on your trip in case you have a flare up of your lung disease Plan to get your covid booster 2-3 weeks prior to leaving Assuming all is well please see me in about 6 months

## 2020-12-15 ENCOUNTER — Telehealth: Payer: Self-pay | Admitting: Acute Care

## 2020-12-15 NOTE — Telephone Encounter (Signed)
Spoke with pt. He states that a CT was mentioned at his last ov and he wanted to make sure if he needed another CT or not. Pt had HRCT on 11/18/20 and was advised of results by Eric Form, NP at [facility] on 11/23/20. I advised pt that no other CT has been ordered at this time. Pt verbalized understanding. He has an appt for f/u with Eric Form, NP on 12/30/20. Nothing further needed at this time.

## 2020-12-30 ENCOUNTER — Ambulatory Visit: Payer: PPO | Admitting: Acute Care

## 2020-12-30 ENCOUNTER — Encounter: Payer: Self-pay | Admitting: Acute Care

## 2020-12-30 ENCOUNTER — Other Ambulatory Visit: Payer: Self-pay

## 2020-12-30 VITALS — BP 112/72 | HR 64 | Temp 97.3°F | Ht 75.0 in | Wt 166.4 lb

## 2020-12-30 DIAGNOSIS — J479 Bronchiectasis, uncomplicated: Secondary | ICD-10-CM

## 2020-12-30 DIAGNOSIS — J432 Centrilobular emphysema: Secondary | ICD-10-CM

## 2020-12-30 DIAGNOSIS — J849 Interstitial pulmonary disease, unspecified: Secondary | ICD-10-CM | POA: Diagnosis not present

## 2020-12-30 NOTE — Progress Notes (Signed)
History of Present Illness Troy Willis is a 75 y.o. 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 Marland KitchenHe is followed by Dr. Valeta Harms    Pt. Was diagnosed with Covid Pneumonia 09/23/20. He was treated with Paxlovid and Levaquin. He was almost back to baseline when he fell putting a new boat cover on his boat. He fell 6/14 and fractured the left eighth through eleventh posterolateral ribs.  12/30/2020 Pt. Presents for follow up. He states he has been doing well. He had Covid in 09/2020, and his wife was + for Covid  in July. He was treated with Paxlovid and Levaquin, and has recovered well. He also had a fall 12/01/2020 and fractured 4 ribs.   Pt. States he has had a stable interval. His breathing has been better. He has less mucus production in the morning. He did note some scant blood in his sputum. It was bright red. It had changed to dark by Tuesday. I asked the patient if he felt he needed antibiotics and he said he did not feel he did. He knows to seek care for any worsening bleeding. No fever or other issues.   He is preparing for a trip to [place] September 14 th. He has a prescription for Levaquin . He does have 1+ ankle edema. He takes lasix as needed.   Test Results: HRCT 11/18/2020 Pulmonary parenchymal pattern of interstitial lung disease, as described above, appears similar to 10/21/2019 and may be due to fibrotic nonspecific interstitial pneumonitis or usual interstitial pneumonitis. Findings are indeterminate for UIP per consensus guidelines:  Acute or subacute appearing fractures of the left eighth through eleventh posterolateral ribs. Scattered pulmonary nodules measure 5 mm or less in size, stable and considered benign. Aortic atherosclerosis (ICD10-I70.0). Coronary artery calcification.  Echo 09/22/2020 Left ventricular ejection fraction, is 60 to 65%. The left ventricle has normal function. The left ventricle has no  regional wall motion abnormalities. Left ventricular diastolic parameters are indeterminate. 2. Right ventricular systolic function is normal. The right ventricular size is mildly enlarged. There is mildly elevated pulmonary artery systolic pressure. The estimated right ventricular systolic pressure is 0000000 mmHg. ( Down from 54. In 2020)  3. Left atrial size was severely dilated. 4. Right atrial size was severely dilated. 5. The mitral valve is abnormal. Moderate mitral valve regurgitation. 6. Tricuspid valve regurgitation is moderate. 7. The aortic valve is tricuspid. Aortic valve regurgitation is not visualized. No aortic stenosis is present. 8. Aortic dilatation noted. There is mild dilatation of the ascending aorta, measuring 38 mm. 9. The inferior vena cava is dilated in size with >50% respiratory variability, suggesting right atrial pressure of 8 mmHg.  Alpha 1 testing 02/2016 > M/M  6MW: 09/2016 660M, O2 saturation 99% 03/2017 600M, O2 saturation 100% 09/26/2018:  714 feet, O2 saturation 97%>> was wearing mask  CBC Latest Ref Rng & Units 08/02/2020 04/02/2020 02/07/2020  WBC 4.0 - 10.5 K/uL 9.2 6.6 3.8(L)  Hemoglobin 13.0 - 17.0 g/dL 12.6(L) 11.5(L) 12.2(L)  Hematocrit 39.0 - 52.0 % 40.0 34.9(L) 35.8(L)  Platelets 150 - 400 K/uL 235 183 192.0    BMP Latest Ref Rng & Units 08/02/2020 04/02/2020 02/07/2020  Glucose 70 - 99 mg/dL 124(H) 100(H) 86  BUN 8 - 23 mg/dL '17 20 21  '$ Creatinine 0.61 - 1.24 mg/dL 0.83 0.67 0.84  BUN/Creat Ratio 10 - 24 - - -  Sodium 135 - 145 mmol/L 131(L) 138 138  Potassium 3.5 - 5.1 mmol/L  4.1 4.7 4.7  Chloride 98 - 111 mmol/L 99 103 102  CO2 22 - 32 mmol/L '24 28 30  '$ Calcium 8.9 - 10.3 mg/dL 9.2 9.2 9.3    BNP No results found for: BNP  ProBNP    Component Value Date/Time   PROBNP 225.0 (H) 03/26/2019 0909    PFT    Component Value Date/Time   FEV1PRE 2.90 10/31/2018 0849   FEV1POST 3.05 10/31/2018 0849   FVCPRE 4.03 10/31/2018 0849    FVCPOST 4.15 10/31/2018 0849   TLC 6.40 10/31/2018 0849   DLCOUNC 17.98 10/31/2018 0849   PREFEV1FVCRT 72 10/31/2018 0849   PSTFEV1FVCRT 74 10/31/2018 0849    No results found.   Past medical hx Past Medical History:  Diagnosis Date   Acquired dilation of ascending aorta and aortic root ([place])    42m ascending aorta by echo 09/2020   Anemia    mild    Arthritis    hands, lower back   Bronchiectasis (HCC)    CAD (coronary artery disease), native coronary artery    a. 08/2015 Cath/PCI: LM nl, LAD 535mLCX small, nl, RCA 80p (4.0x16 Synergy DES),    Cancer (HCC)    skin on forehead   Cataracts, bilateral    surgery   Depression    pt denies    Diverticulosis    Dyspnea    occasional    GERD (gastroesophageal reflux disease)    Hilar adenopathy    a. 08/2015 CT Chest: mild bilat hilar adenopathy and mild soft tissue prominence in inf right hilum, left hilum. Also areas of soft tissue and low-density material filling LLL bronchial airways->? mucous vs mass.   Hx of adenomatous colonic polyps 08/13/2015   Hyperlipidemia    MVP (mitral valve prolapse)    bileaflet with  moderate MR by echo 09/2020   [facility]   Nonspecific elevation of levels of transaminase or lactic acid dehydrogenase (LDH)    PMH of ; ? 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 CHA2DS2VASc = 2 -->Xarelto started 08/2015.   Pneumonia, bacterial 05/18/2012   Pulmonary HTN (HCC)    PASP 4823m on echo 08/2019>>Likely Group 3 related to bronchiectasis>>repeat echo 09/2020 with PASP 43m46m  Pulmonary nodules    a. 08/2015 CT Chest: reticulonodular opacities in RUL - largest 5mm 83mikely 2/2 inflammatory process.   [facility]      Social History   Tobacco Use   Smoking status: Never   Smokeless tobacco: Former    Types: Chew    Quit date: 05/16/1978   Tobacco comments:    occasional cigar in past; not  regular smoker  Vaping Use   Vaping Use: Never used  Substance Use Topics   Alcohol use: Yes    Comment: 5 beers per week    Drug use: No    Mr.Codd reports that he has never smoked. He quit smokeless tobacco use about 42 years ago.  His smokeless tobacco use included chew. He reports current alcohol use. He reports that he does not use drugs.  Tobacco Cessation: Counseling given: Yes Tobacco comments: occasional cigar in past; not regular smoker Smoking cessation counseling was given.   Past surgical hx, Family hx, Social hx all reviewed.  Current Outpatient Medications on File Prior to Visit  Medication Sig   Cholecalciferol (VITAMIN D3) 2000 UNITS TABS Take 2,000 Units by  mouth daily.    famotidine (PEPCID) 20 MG tablet Take 20 mg by mouth daily as needed for heartburn or indigestion.   furosemide (LASIX) 20 MG tablet Take 20 mg by mouth daily as needed for fluid or edema.    Glucosamine-Chondroitin (MOVE FREE PO) Take 1 tablet by mouth daily.   Guaifenesin (MUCINEX MAXIMUM STRENGTH) 1200 MG TB12 Take 1,200 mg by mouth at bedtime.   hydrocortisone 2.5 % ointment Apply 1 application topically 2 (two) times daily as needed (irritation).    levofloxacin (LEVAQUIN) 750 MG tablet Take 1 tablet (750 mg total) by mouth daily.   Melatonin 5 MG CAPS Take 5 mg by mouth at bedtime.   Multiple Vitamin (MULTIVITAMIN) tablet Take 1 tablet by mouth daily.   nitroGLYCERIN (NITROSTAT) 0.4 MG SL tablet Place 1 tablet (0.4 mg total) under the tongue every 5 (five) minutes x 3 doses as needed for chest pain.   Respiratory Therapy Supplies (FLUTTER) DEVI Use as directed   rivaroxaban (XARELTO) 20 MG TABS tablet TAKE ONE TABLET BY MOUTH ONE TIME DAILY with supper (Patient taking differently: Take 20 mg by mouth daily.)   rosuvastatin (CRESTOR) 20 MG tablet Take 1 tablet (20 mg total) by mouth daily.   triamcinolone cream (KENALOG) 0.1 % Apply 1 application topically daily as needed (rash).     Current Facility-Administered Medications on File Prior to Visit  Medication   gemcitabine (GEMZAR) chemo syringe for bladder instillation 2,000 mg     Allergies  Allergen Reactions   Amiodarone Other (See Comments)    elevated liver enzymes   Atorvastatin Other (See Comments)    Nocturnal leg cramps   Black Pepper-Turmeric Other (See Comments)    heartburn   Metoprolol Other (See Comments)    Hypotension     Review Of Systems:  Constitutional:   No  weight loss, night sweats,  Fevers, chills, fatigue, or  lassitude.  HEENT:   No headaches,  Difficulty swallowing,  Tooth/dental problems, or  Sore throat,                No sneezing, itching, ear ache, nasal congestion, post nasal drip,   CV:  No chest pain,  Orthopnea, PND, + mild swelling in lower extremities, No anasarca, dizziness, palpitations, syncope.   GI  No heartburn, indigestion, abdominal pain, nausea, vomiting, diarrhea, change in bowel habits, loss of appetite, bloody stools.   Resp: No shortness of breath with exertion or at rest.  No excess mucus, no productive cough,  No non-productive cough,  + scant  coughing up of blood.  No change in color of mucus.  No wheezing.  No chest wall deformity  Skin: no rash or lesions.  GU: no dysuria, change in color of urine, no urgency or frequency.  No flank pain, no hematuria   MS:  No joint pain or swelling.  No decreased range of motion.  No back pain.  Psych:  No change in mood or affect. No depression or anxiety.  No memory loss.   Vital Signs BP 112/72 (BP Location: Right Arm, Cuff Size: Normal)   Pulse 64   Temp (!) 97.3 F (36.3 C) (Oral)   Ht '6\' 3"'$  (1.905 m)   Wt 166 lb 6.4 oz (75.5 kg)   SpO2 97%   BMI 20.80 kg/m    Physical Exam:  General- No distress,  A&O x 3, pleasant ENT: No sinus tenderness, TM clear, pale nasal mucosa, no oral exudate,no post nasal drip, no LAN  Cardiac: S1, S2, regular rate and rhythm, no murmur Chest: No wheeze/ rales/  dullness; no accessory muscle use, no nasal flaring, no sternal retractions, few crackles per bases. 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  Annual echos per cardiology If worsening, refer to Dr. 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  We will schedule a 6 minute walk before next visit We will schedule a PFT before next visit  Bronchiectasis Noted one day of blood , now cleared Does not feel he needs antibiotic, no fever Plan Continue Mucinex daily Continue Flutter valve daily Call for any change in secretions to allow [place] to be proactive to flares Follow up with [name] NP or Dr. Valeta Harms in 3 months BD ( Xopenex) for  use as needed Seek emergency care for bleeding that is bright red and greater than 120 cc's in volume.  RX for Levaquin per PCP in case of flares while in [place]  3 month follow up with 6 minute walk and PFT's  Next HR CT Chest Due 11/2021  I spent 35 minutes dedicated to the care of this patient on the date of this encounter to include pre-visit review of records, face-to-face time with the patient discussing conditions above, post visit ordering of testing, clinical documentation with the electronic health record, making appropriate referrals as documented, and communicating  necessary information to the patient's healthcare team.     [name], NP 12/30/2020  4:31 PM

## 2020-12-30 NOTE — Patient Instructions (Addendum)
It is good to see you today. We will do a 6 minute walk at your 3 month check up.  We will do PFT's prior to your 3 month follow up. ( Try week prior to 3 month follow up)  Annual HRCT due in 11/2021 Annual Echo due 09/2021 Continue walking and exercising as you have been doing Continue to use lasix as needed for lower extremity swelling Continue daily weights Follow a low salt diet Follow up with cardiology as you have been doing.  Continue Mucinex daily Continue Flutter valve daily Call for any change in secretions to allow [place] to be proactive to flares Follow up in 3 months with Troy Roch NP or Troy Willis Have a great trip!! Please contact office for sooner follow up if symptoms do not improve or worsen or seek emergency care

## 2020-12-31 NOTE — Progress Notes (Signed)
PCCM:  Thanks for seeing him   Ferron Ishmael L Jovonda Selner, DO [facility] 12/31/2020 5:44 PM     

## 2020-12-31 NOTE — Progress Notes (Signed)
PCCM:  Thanks for seeing him  Garner Nash, DO [facility] 12/31/2020 11:00 AM

## 2021-01-01 ENCOUNTER — Ambulatory Visit: Payer: PPO | Attending: Internal Medicine

## 2021-01-01 DIAGNOSIS — Z23 Encounter for immunization: Secondary | ICD-10-CM

## 2021-01-01 NOTE — Progress Notes (Signed)
   Covid-19 Vaccination Clinic  Name:  Troy Willis    MRN: [id number] DOB: January 11, 1946  01/01/2021  Troy Willis was observed post Covid-19 immunization for 15 minutes without incident. He was provided with Vaccine Information Sheet and instruction to access the V-Safe system.   Troy Willis was instructed to call 911 with any severe reactions post vaccine: Difficulty breathing  Swelling of face and throat  A fast heartbeat  A bad rash all over body  Dizziness and weakness   Immunizations Administered     Name Date Dose VIS Date Route   Moderna Covid-19 Booster Vaccine 01/01/2021 12:11 PM 0.25 mL 03/04/2020 Intramuscular   Manufacturer: Moderna   Lot: [id number]   CobbVO:[id number]

## 2021-01-05 DIAGNOSIS — L905 Scar conditions and fibrosis of skin: Secondary | ICD-10-CM | POA: Diagnosis not present

## 2021-01-05 DIAGNOSIS — L718 Other rosacea: Secondary | ICD-10-CM | POA: Diagnosis not present

## 2021-01-05 DIAGNOSIS — Z85828 Personal history of other malignant neoplasm of skin: Secondary | ICD-10-CM | POA: Diagnosis not present

## 2021-01-05 DIAGNOSIS — L819 Disorder of pigmentation, unspecified: Secondary | ICD-10-CM | POA: Diagnosis not present

## 2021-01-05 DIAGNOSIS — L570 Actinic keratosis: Secondary | ICD-10-CM | POA: Diagnosis not present

## 2021-01-10 ENCOUNTER — Other Ambulatory Visit: Payer: Self-pay | Admitting: Family Medicine

## 2021-01-12 ENCOUNTER — Other Ambulatory Visit: Payer: Self-pay

## 2021-01-12 ENCOUNTER — Other Ambulatory Visit: Payer: PPO | Admitting: *Deleted

## 2021-01-12 DIAGNOSIS — E7800 Pure hypercholesterolemia, unspecified: Secondary | ICD-10-CM | POA: Diagnosis not present

## 2021-01-12 DIAGNOSIS — L570 Actinic keratosis: Secondary | ICD-10-CM | POA: Diagnosis not present

## 2021-01-12 LAB — LIPID PANEL
Chol/HDL Ratio: 2.2 ratio (ref 0.0–5.0)
Cholesterol, Total: 153 mg/dL (ref 100–199)
HDL: 69 mg/dL (ref >39)
LDL Chol Calc (NIH): 75 mg/dL (ref 0–99)
Triglycerides: 41 mg/dL (ref 0–149)
VLDL Cholesterol Cal: 9 mg/dL (ref 5–40)

## 2021-01-12 LAB — HEPATIC FUNCTION PANEL
ALT: 18 [IU]/L (ref 0–44)
AST: 26 [IU]/L (ref 0–40)
Albumin: 4.6 g/dL (ref 3.7–4.7)
Alkaline Phosphatase: 78 [IU]/L (ref 44–121)
Bilirubin Total: 0.4 mg/dL (ref 0.0–1.2)
Bilirubin, Direct: 0.17 mg/dL (ref 0.00–0.40)
Total Protein: 7.5 g/dL (ref 6.0–8.5)

## 2021-01-14 ENCOUNTER — Telehealth: Payer: Self-pay

## 2021-01-14 DIAGNOSIS — E7800 Pure hypercholesterolemia, unspecified: Secondary | ICD-10-CM

## 2021-01-14 MED ORDER — ROSUVASTATIN CALCIUM 40 MG PO TABS: 40.0000 mg | ORAL_TABLET | Freq: Every day | ORAL | 3 refills | Status: DC

## 2021-01-14 NOTE — Telephone Encounter (Signed)
The patient has been notified of the result and verbalized understanding.  All questions (if any) were answered. Antonieta Iba, RN 01/14/2021 2:39 PM    Patient has been tolerating rosuvastatin 20 mg daily. He is willing to try rosuvastatin 40 mg daily. He will let us know if he is unable to tolerate a higher dose. He will repeat labs at his next office visit 11/17.

## 2021-01-14 NOTE — Telephone Encounter (Signed)
-----   Message from Leeroy Bock, [place] sent at 01/14/2021  1:45 PM EDT ----- Pt previously took atorvastatin '80mg'$  daily but experienced leg cramps and was changed to rosuvastatin '20mg'$  daily. Would confirm that pt is tolerating rosuvastatin and if so, increase to '40mg'$  daily to target LDL goal < 70. If he feels that '20mg'$  is his max tolerated dose, would add ezetimibe '10mg'$  daily. Recheck lipids in 2 months.

## 2021-01-15 ENCOUNTER — Other Ambulatory Visit: Payer: Self-pay | Admitting: Family Medicine

## 2021-01-19 ENCOUNTER — Telehealth: Payer: Self-pay | Admitting: Family Medicine

## 2021-01-19 ENCOUNTER — Encounter: Payer: Self-pay | Admitting: Family Medicine

## 2021-01-19 MED ORDER — FUROSEMIDE 20 MG PO TABS: 20.0000 mg | ORAL_TABLET | Freq: Every day | ORAL | 1 refills | Status: DC | PRN

## 2021-01-19 NOTE — Telephone Encounter (Signed)
Medication: furosemide (LASIX) 20 MG tablet   Has the patient contacted their pharmacy? No. (If no, request that the patient contact the pharmacy for the refill.) (If yes, when and what did the pharmacy advise?)  Preferred Pharmacy (with phone number or street name):  [facility] [place], [place]  [address] [place] [address]  Phone:  [phone]    Agent: Please be advised that RX refills may take up to 3 business days. We ask that you follow-up with your pharmacy.

## 2021-01-25 ENCOUNTER — Telehealth: Payer: Self-pay | Admitting: Cardiology

## 2021-01-25 ENCOUNTER — Other Ambulatory Visit (HOSPITAL_BASED_OUTPATIENT_CLINIC_OR_DEPARTMENT_OTHER): Payer: Self-pay

## 2021-01-25 MED ORDER — COVID-19 MRNA VACC (MODERNA) 100 MCG/0.5ML IM SUSP
INTRAMUSCULAR | 0 refills | Status: DC
  Filled 2021-01-25: qty 0.25, 1d supply, fill #0

## 2021-01-25 MED ORDER — EZETIMIBE 10 MG PO TABS: 10.0000 mg | ORAL_TABLET | Freq: Every day | ORAL | 3 refills | Status: DC

## 2021-01-25 MED ORDER — ROSUVASTATIN CALCIUM 20 MG PO TABS: 20.0000 mg | ORAL_TABLET | Freq: Every day | ORAL | 3 refills | Status: DC

## 2021-01-25 NOTE — Telephone Encounter (Signed)
Troy Willis- please advise on pt email, thanks!  Good Morning Troy Willis, I am going to [place] for two  weeks leaving this Wednesday, 01/27/21.  I will be carrying a course of Levofloxacin in case I get Covid again or a severe upper respiratory infection.  My question... I'm 75 Y.O., Bronchiectasis, Afib, ...should I also carry a course of  Paxlovid in the event I were to come down with one of the new Covid strains?  (Or am I getting silly paranoid in my old age?) Thank you, Troy Willis

## 2021-01-25 NOTE — Telephone Encounter (Signed)
I would recommend patient hold rosuvastatin for a few days and then resume at '20mg'$  daily when he feels better. He may slit the '40mg'$  tablets in half. Add Zetia '10mg'$  daily.

## 2021-01-25 NOTE — Telephone Encounter (Signed)
Pt c/o medication issue:  1. Name of Medication: rosuvastatin (CRESTOR) 40 MG tablet  2. How are you currently taking this medication (dosage and times per day)? 1 tablet by mouth daily  3. Are you having a reaction (difficulty breathing--STAT)?  no  4. What is your medication issue? Causing severe night leg cramps

## 2021-01-25 NOTE — Telephone Encounter (Signed)
Patient's rosuvastatin was recently increased from 20 mg to '40mg'$ . He is unable to tolerate increased dose.

## 2021-01-25 NOTE — Telephone Encounter (Signed)
Spoke with the patient and advised him on recommendations from PharmD. Patient verbalized understanding. Prescriptions have been updated.

## 2021-03-02 ENCOUNTER — Encounter: Payer: Self-pay | Admitting: Family Medicine

## 2021-03-02 ENCOUNTER — Telehealth (INDEPENDENT_AMBULATORY_CARE_PROVIDER_SITE_OTHER): Payer: PPO | Admitting: Family Medicine

## 2021-03-02 ENCOUNTER — Other Ambulatory Visit (HOSPITAL_BASED_OUTPATIENT_CLINIC_OR_DEPARTMENT_OTHER): Payer: Self-pay

## 2021-03-02 ENCOUNTER — Other Ambulatory Visit: Payer: Self-pay

## 2021-03-02 DIAGNOSIS — J111 Influenza due to unidentified influenza virus with other respiratory manifestations: Secondary | ICD-10-CM

## 2021-03-02 MED ORDER — OSELTAMIVIR PHOSPHATE 75 MG PO CAPS
75.0000 mg | ORAL_CAPSULE | Freq: Two times a day (BID) | ORAL | 0 refills | Status: DC
  Filled 2021-03-02: qty 10, 5d supply, fill #0
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
## 2021-03-03 ENCOUNTER — Telehealth: Payer: Self-pay

## 2021-03-03 DIAGNOSIS — J111 Influenza due to unidentified influenza virus with other respiratory manifestations: Secondary | ICD-10-CM | POA: Insufficient documentation

## 2021-03-03 NOTE — Telephone Encounter (Signed)
See below

## 2021-03-03 NOTE — Telephone Encounter (Signed)
SG please advise. Thanks  [place] "[name]"  [facility] Pool Good Morning, NP [place],  Just a short update on my recent medical adventure.  Summary: I think I have the flu and I'm taking Tamaflu.  Sunday:  Tired, no energy.  Monday:  Slept a lot, day and night.  Chilled.  After lunch, Temp 99.4, bedtime 103. Taking Tylenol.  Tuesday:  Took covid TEST, negative result.  Slept about all day and night.  Temp 101.2 in morning, 103 at bedtime.  Had virtual visit at 5pm with Dr. or NP Etter Sjogren at [facility] in [place]. Symptoms as described:  Very low energy (brush teeth and rest), noticeably unsteady on my feet,  occasional dry coughing with no significant production.  (I usually have more daily production than since Sunday.(?)  Dr. Etter Sjogren asked about my O2 percentage and I checked it yesterday and today and found it to be 94% to 96%.    I took Tamiflu last night and this morning. Temperature this morning about 101.  Just wanted to update you on whats going on with me.  Please let me know if you want me to take any additional steps.  Thanks again for all you do.  Conard Alvira  10/10/1945  [phone]

## 2021-03-03 NOTE — Assessment & Plan Note (Signed)
tamiflu  Pt has levaquin from pulm if his symptoms worsen Ok to con't mucinex If he becomes sob or Pulse ox < 95% rto or go to ER

## 2021-03-03 NOTE — Telephone Encounter (Signed)
Caller states he just had an video appt and was asked to check his 67. It is 95-96%. Please let provider know.  Telephone: [phone]  Transaction Date/Time: 03/02/2021 5:26:58 PM (ET)

## 2021-03-04 ENCOUNTER — Encounter: Payer: Self-pay | Admitting: Family Medicine

## 2021-03-04 ENCOUNTER — Other Ambulatory Visit: Payer: Self-pay | Admitting: Family Medicine

## 2021-03-04 DIAGNOSIS — J111 Influenza due to unidentified influenza virus with other respiratory manifestations: Secondary | ICD-10-CM

## 2021-03-04 NOTE — Telephone Encounter (Signed)
SG please advise. Thanks  Ahmani, Prehn "[name]"  [facility] Pool 03/04/21  Alleen Borne Update to NP [place] Re Flu  Feeling better than yesterday.but still weak and terrible and sleeping a lot.  Temp this morning 100.3F, O2 saturation 89/90 %.    Breathing OK and sputum production less than usual.  Light rasbery rash on torso and legs.no itching or distress, perhaps reaction to Tamaflu or Flu?  Generally improving.  Thanks, [employer]

## 2021-03-05 ENCOUNTER — Encounter: Payer: Self-pay | Admitting: Family Medicine

## 2021-03-08 ENCOUNTER — Encounter: Payer: Self-pay | Admitting: Family Medicine

## 2021-03-10 ENCOUNTER — Ambulatory Visit: Payer: PPO | Attending: Internal Medicine

## 2021-03-10 DIAGNOSIS — Z23 Encounter for immunization: Secondary | ICD-10-CM

## 2021-03-10 NOTE — Telephone Encounter (Signed)
Will forward to SG as an FYI.

## 2021-03-10 NOTE — Progress Notes (Signed)
   Covid-19 Vaccination Clinic  Name:  Troy Willis    MRN: [id number] DOB: [birth year]  03/10/2021  Troy Willis was observed post Covid-19 immunization for 15 minutes without incident. He was provided with Vaccine Information Sheet and instruction to access the V-Safe system.   Troy Willis was instructed to call 911 with any severe reactions post vaccine: Difficulty breathing  Swelling of face and throat  A fast heartbeat  A bad rash all over body  Dizziness and weakness   Immunizations Administered     Name Date Dose VIS Date Route   Moderna Covid-19 vaccine Bivalent Booster 03/10/2021 12:13 PM 0.5 mL 12/26/2020 Intramuscular   Manufacturer: Moderna   Lot: [id number]   [place]: [id number]

## 2021-03-15 ENCOUNTER — Encounter: Payer: Self-pay | Admitting: Family Medicine

## 2021-03-23 ENCOUNTER — Other Ambulatory Visit: Payer: Self-pay

## 2021-03-23 ENCOUNTER — Ambulatory Visit (INDEPENDENT_AMBULATORY_CARE_PROVIDER_SITE_OTHER): Payer: PPO | Admitting: Pulmonary Disease

## 2021-03-23 DIAGNOSIS — J432 Centrilobular emphysema: Secondary | ICD-10-CM

## 2021-03-23 DIAGNOSIS — J849 Interstitial pulmonary disease, unspecified: Secondary | ICD-10-CM | POA: Diagnosis not present

## 2021-03-23 DIAGNOSIS — J479 Bronchiectasis, uncomplicated: Secondary | ICD-10-CM

## 2021-03-23 LAB — PULMONARY FUNCTION TEST
DL/VA % pred: 69 %
DL/VA: 2.69 ml/min/mmHg/L
DLCO cor % pred: 53 %
DLCO cor: 15.1 ml/min/mmHg
DLCO unc % pred: 53 %
DLCO unc: 15.1 ml/min/mmHg
FEF 25-75 Post: 2.23 L/sec
FEF 25-75 Pre: 1.83 L/sec
FEF2575-%Change-Post: 22 %
FEF2575-%Pred-Post: 87 %
FEF2575-%Pred-Pre: 71 %
FEV1-%Change-Post: 4 %
FEV1-%Pred-Post: 78 %
FEV1-%Pred-Pre: 74 %
FEV1-Post: 2.78 L
FEV1-Pre: 2.65 L
FEV1FVC-%Change-Post: 1 %
FEV1FVC-%Pred-Pre: 101 %
FEV6-%Change-Post: 3 %
FEV6-%Pred-Post: 81 %
FEV6-%Pred-Pre: 78 %
FEV6-Post: 3.73 L
FEV6-Pre: 3.62 L
FEV6FVC-%Pred-Post: 106 %
FEV6FVC-%Pred-Pre: 106 %
FVC-%Change-Post: 3 %
FVC-%Pred-Post: 76 %
FVC-%Pred-Pre: 74 %
FVC-Post: 3.73 L
FVC-Pre: 3.62 L
Post FEV1/FVC ratio: 74 %
Post FEV6/FVC ratio: 100 %
Pre FEV1/FVC ratio: 73 %
Pre FEV6/FVC Ratio: 100 %
RV % pred: 75 %
RV: 2.11 L
TLC % pred: 74 %
TLC: 5.83 L

## 2021-03-23 NOTE — Patient Instructions (Signed)
Full PFT performed today. °

## 2021-03-23 NOTE — Progress Notes (Signed)
Full PFT performed today. °

## 2021-03-31 ENCOUNTER — Ambulatory Visit: Payer: PPO | Admitting: Acute Care

## 2021-03-31 ENCOUNTER — Telehealth: Payer: Self-pay | Admitting: Acute Care

## 2021-03-31 ENCOUNTER — Other Ambulatory Visit: Payer: Self-pay

## 2021-03-31 ENCOUNTER — Encounter: Payer: Self-pay | Admitting: Acute Care

## 2021-03-31 VITALS — BP 110/70 | HR 64 | Resp 18 | Ht 75.0 in | Wt 170.0 lb

## 2021-03-31 DIAGNOSIS — J479 Bronchiectasis, uncomplicated: Secondary | ICD-10-CM | POA: Diagnosis not present

## 2021-03-31 DIAGNOSIS — Z0000 Encounter for general adult medical examination without abnormal findings: Secondary | ICD-10-CM | POA: Diagnosis not present

## 2021-03-31 DIAGNOSIS — J849 Interstitial pulmonary disease, unspecified: Secondary | ICD-10-CM

## 2021-03-31 DIAGNOSIS — R062 Wheezing: Secondary | ICD-10-CM

## 2021-03-31 LAB — CBC WITH DIFFERENTIAL/PLATELET
Basophils Absolute: 0.1 10*3/uL (ref 0.0–0.1)
Basophils Relative: 0.9 % (ref 0.0–3.0)
Eosinophils Absolute: 0.2 10*3/uL (ref 0.0–0.7)
Eosinophils Relative: 3.4 % (ref 0.0–5.0)
HCT: 34.7 % — ABNORMAL LOW (ref 39.0–52.0)
Hemoglobin: 11.4 g/dL — ABNORMAL LOW (ref 13.0–17.0)
Lymphocytes Relative: 31.7 % (ref 12.0–46.0)
Lymphs Abs: 1.8 10*3/uL (ref 0.7–4.0)
MCHC: 32.9 g/dL (ref 30.0–36.0)
MCV: 104.1 fl — ABNORMAL HIGH (ref 78.0–100.0)
Monocytes Absolute: 0.7 10*3/uL (ref 0.1–1.0)
Monocytes Relative: 11.6 % (ref 3.0–12.0)
Neutro Abs: 3 10*3/uL (ref 1.4–7.7)
Neutrophils Relative %: 52.4 % (ref 43.0–77.0)
Platelets: 144 10*3/uL — ABNORMAL LOW (ref 150.0–400.0)
RBC: 3.33 Mil/uL — ABNORMAL LOW (ref 4.22–5.81)
RDW: 14.1 % (ref 11.5–15.5)
WBC: 5.7 10*3/uL (ref 4.0–10.5)

## 2021-03-31 NOTE — Patient Instructions (Addendum)
It is good to see you today We will order an HRCT  You will get a call to schedule this We will get your 6 minute walk scheduled We will check CBC today. We will call you with results Work on slowly increasing activity as you can  Mucinex and flutter valve Call at earliest signs of flare Continue to follow up with cardiology Check on earlier echo Try a 2 week trial of Omeprazole 20 mg daily to see if wheezing resolves.  Follow up in 1 months with Dr. Valeta Harms of [facility] NP Please contact office for sooner follow up if symptoms do not improve or worsen or seek emergency care

## 2021-03-31 NOTE — Progress Notes (Signed)
History of Present Illness  Troy Willis is a 75 y.o. 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 Marland KitchenHe is followed by Dr. Valeta Harms    He had a rotator cuff surgery 04/2020 which did impact his abilty exercise. He walks daily.  Pt. Was diagnosed with Covid Pneumonia 09/23/20. He was treated with Paxlovid and Levaquin.   He was almost back to baseline when he fell putting a new boat cover on his boat. He fell 6/14 and fractured the left eighth through eleventh posterolateral ribs.  HIs wife has had a bad year with chronic illness. She has just been started on Hydrea for polycytopenia.   He went to [place] in September. He had the flu 03/03/2021 and was treated with Tamiflu. 03/10/2021 he received his Covid Booster.  Full PFT's were done 03/23/2021 which showed Moderately reduced DLCO and mild restriction , but drop from 63% to 53% in 2.5 years.    03/31/2021 Pt. Presents for follow up. He states he has been doing well. He is post Covid 09/2020. He had a trip to [place] 01/2021, then had the flu 02/2021. He states he is doing well. He had a wonderful trip to [place]. He is working on increasing his activity. He has noted he has been wheezing at night, and that his dyspnea is a bit worse since the last time he was here.  He denies any fever, or chest pain, no orthopnea or hemoptysis.  I am concerned that his DLCO has dropped from 63% to 53% since last PFTs. 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 He did state that he is less active than he has been in the past just because this is been kind of a tough year.  It is possible that some of this could be due to deconditioning. 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  He states his bronchiectasis has been very stable, he uses his Mucinex and flutter  valve as needed.  I did check CBC to determine if anemia could possibly be playing a part in his dyspnea.  He does have a hemoglobin of 11.4 which is about 1 g below what it was in March. CBC Latest Ref Rng & Units 03/31/2021 08/02/2020 04/02/2020  WBC 4.0 - 10.5 K/uL 5.7 9.2 6.6  Hemoglobin 13.0 - 17.0 g/dL 11.4(L) 12.6(L) 11.5(L)  Hematocrit 39.0 - 52.0 % 34.7(L) 40.0 34.9(L)  Platelets 150.0 - 400.0 K/uL 144.0(L) 235 183     Test Results:  09/2020 Echo showed normal heart function with mildly enlarged RV, mild pulmonary HTN, severe biatrial enlargement, moderate MR and TR and mildly dilated ascending aorta at 59mm.  Compared to prior echo, the PASP has decreased from 73mmHg to 48mmHg and MR is now moderate.  Repeat echo in 1 year for MR  HRCT 11/2020 Pulmonary parenchymal pattern of interstitial lung disease, as described above, appears similar to 10/21/2019 and may be due to fibrotic nonspecific interstitial pneumonitis or usual interstitial pneumonitis. Findings are indeterminate for UIP per consensus guidelines: Diagnosis of Idiopathic Pulmonary Fibrosis: An Official ATS/ERS/JRS/ALAT Clinical Practice Guideline. [place], Iss 5, [phone], Jan 14 2017. 2. Acute or subacute appearing fractures of the left eighth through eleventh posterolateral ribs. 3. Scattered pulmonary nodules measure 5 mm or less in size, stable and  considered benign. 4. Aortic atherosclerosis (ICD10-I70.0). Coronary artery calcification.           . CBC Latest Ref Rng & Units 08/02/2020 04/02/2020 02/07/2020  WBC 4.0 - 10.5 K/uL 9.2 6.6 3.8(L)  Hemoglobin 13.0 - 17.0 g/dL 12.6(L) 11.5(L) 12.2(L)  Hematocrit 39.0 - 52.0 % 40.0 34.9(L) 35.8(L)  Platelets 150 - 400 K/uL 235 183 192.0    BMP Latest Ref Rng & Units 08/02/2020 04/02/2020 02/07/2020  Glucose 70 - 99 mg/dL 124(H) 100(H) 86  BUN 8 - 23 mg/dL 17 20 21   Creatinine 0.61 - 1.24 mg/dL 0.83 0.67 0.84  BUN/Creat Ratio 10 -  24 - - -  Sodium 135 - 145 mmol/L 131(L) 138 138  Potassium 3.5 - 5.1 mmol/L 4.1 4.7 4.7  Chloride 98 - 111 mmol/L 99 103 102  CO2 22 - 32 mmol/L 24 28 30   Calcium 8.9 - 10.3 mg/dL 9.2 9.2 9.3    BNP No results found for: BNP  ProBNP    Component Value Date/Time   PROBNP 225.0 (H) 03/26/2019 0909    PFT    Component Value Date/Time   FEV1PRE 2.65 03/23/2021 0857   FEV1POST 2.78 03/23/2021 0857   FVCPRE 3.62 03/23/2021 0857   FVCPOST 3.73 03/23/2021 0857   TLC 5.83 03/23/2021 0857   DLCOUNC 15.10 03/23/2021 0857   PREFEV1FVCRT 73 03/23/2021 0857   PSTFEV1FVCRT 74 03/23/2021 0857    No results found.   Past medical hx Past Medical History:  Diagnosis Date   Acquired dilation of ascending aorta and aortic root ([place])    55mm ascending aorta by echo 09/2020   Anemia    mild    Arthritis    hands, lower back   Bronchiectasis (HCC)    CAD (coronary artery disease), native coronary artery    a. 08/2015 Cath/PCI: LM nl, LAD 62m, LCX small, nl, RCA 80p (4.0x16 Synergy DES),    Cancer (HCC)    skin on forehead   Cataracts, bilateral    surgery   Depression    pt denies    Diverticulosis    Dyspnea    occasional    GERD (gastroesophageal reflux disease)    Hilar adenopathy    a. 08/2015 CT Chest: mild bilat hilar adenopathy and mild soft tissue prominence in inf right hilum, left hilum. Also areas of soft tissue and low-density material filling LLL bronchial airways->? mucous vs mass.   Hx of adenomatous colonic polyps 08/13/2015   Hyperlipidemia    MVP (mitral valve prolapse)    bileaflet with  moderate MR by echo 09/2020   Myocardial infarction [facility])    Nonspecific elevation of levels of transaminase or lactic acid dehydrogenase (LDH)    PMH of ; ? 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 CHA2DS2VASc = 2 -->Xarelto started 08/2015.   Pneumonia, bacterial 05/18/2012   Pulmonary HTN (HCC)    PASP 39mmHg on echo  08/2019>>Likely Group 3 related to bronchiectasis>>repeat echo 09/2020 with PASP 80mmhg   Pulmonary nodules    a. 08/2015 CT Chest: reticulonodular opacities in RUL - largest 42mm - likely 2/2 inflammatory process.   Stroke [facility])    Tricuspid regurgitation      Social History   Tobacco Use   Smoking status: Never   Smokeless tobacco: Former    Types: Chew    Quit date: 05/16/1978   Tobacco comments:    occasional cigar in past; not  regular smoker  Vaping Use   Vaping Use: Never used  Substance Use Topics   Alcohol use: Yes    Comment: 5 beers per week    Drug use: No    Mr.Bahri reports that he has never smoked. He quit smokeless tobacco use about 42 years ago.  His smokeless tobacco use included chew. He reports current alcohol use. He reports that he does not use drugs.  Tobacco Cessation: Never smoker  Past surgical hx, Family hx, Social hx all reviewed.  Current Outpatient Medications on File Prior to Visit  Medication Sig   Cholecalciferol (VITAMIN D3) 2000 UNITS TABS Take 2,000 Units by mouth daily.    ezetimibe (ZETIA) 10 MG tablet Take 1 tablet (10 mg total) by mouth daily.   famotidine (PEPCID) 20 MG tablet Take 20 mg by mouth daily as needed for heartburn or indigestion.   FLUAD QUADRIVALENT 0.5 ML injection    furosemide (LASIX) 20 MG tablet Take 1 tablet (20 mg total) by mouth daily as needed for fluid or edema.   Guaifenesin (MUCINEX MAXIMUM STRENGTH) 1200 MG TB12 Take 1,200 mg by mouth at bedtime.   hydrocortisone 2.5 % ointment Apply 1 application topically 2 (two) times daily as needed (irritation).    levofloxacin (LEVAQUIN) 750 MG tablet Take 1 tablet (750 mg total) by mouth daily.   Melatonin 5 MG CAPS Take 5 mg by mouth at bedtime.   nitroGLYCERIN (NITROSTAT) 0.4 MG SL tablet Place 1 tablet (0.4 mg total) under the tongue every 5 (five) minutes x 3 doses as needed for chest pain.   oseltamivir (TAMIFLU) 75 MG capsule Take 1 capsule (75 mg total) by mouth 2  (two) times daily.   Respiratory Therapy Supplies (FLUTTER) DEVI Use as directed   rivaroxaban (XARELTO) 20 MG TABS tablet TAKE ONE TABLET BY MOUTH ONE TIME DAILY with supper (Patient taking differently: Take 20 mg by mouth daily.)   rosuvastatin (CRESTOR) 20 MG tablet Take 1 tablet (20 mg total) by mouth daily.   triamcinolone cream (KENALOG) 0.1 % Apply 1 application topically daily as needed (rash).    Current Facility-Administered Medications on File Prior to Visit  Medication   gemcitabine (GEMZAR) chemo syringe for bladder instillation 2,000 mg     Allergies  Allergen Reactions   Amiodarone Other (See Comments)    elevated liver enzymes   Atorvastatin Other (See Comments)    Nocturnal leg cramps   Black Pepper-Turmeric Other (See Comments)    heartburn   Metoprolol Other (See Comments)    Hypotension     Review Of Systems:  Constitutional:   No  weight loss, night sweats,  Fevers, chills, fatigue, or  lassitude.  HEENT:   No headaches,  Difficulty swallowing,  Tooth/dental problems, or  Sore throat,                No sneezing, itching, ear ache, nasal congestion, post nasal drip,   CV:  No chest pain,  Orthopnea, PND, swelling in lower extremities, anasarca, dizziness, palpitations, syncope.   GI  No heartburn, indigestion, abdominal pain, nausea, vomiting, diarrhea, change in bowel habits, loss of appetite, bloody stools.   Resp: + shortness of breath with exertion or at rest.  Baseline  excess mucus, no productive cough,  No non-productive cough,  No coughing up of blood.  No change in color of mucus.  No wheezing.  No chest wall deformity  Skin: no rash or lesions.  GU: no dysuria, change in color of  urine, no urgency or frequency.  No flank pain, no hematuria   MS:  No joint pain or swelling.  No decreased range of motion.  No back pain.  Psych:  No change in mood or affect. No depression or anxiety.  No memory loss.   Vital Signs BP 110/70   Pulse 64   Resp  18   Ht 6\' 3"  (1.905 m)   Wt 170 lb (77.1 kg)   SpO2 96%   BMI 21.25 kg/m    Physical Exam:  General- No distress,  A&O x 3, pleasant ENT: No sinus tenderness, TM clear, pale nasal mucosa, no oral exudate,no post nasal drip, no LAN Cardiac: S1, S2, regular rate and rhythm, no murmur Chest: No wheeze/ rales/ dullness; no accessory muscle use, no nasal flaring, no sternal retractions, crackles per bases bilaterally Abd.: Soft Non-tender, ND. BS +, Body mass index is 21.25 kg/m.  Ext: No clubbing cyanosis, edema Neuro:  normal strength, MAE x 4, A&O x 3 Skin: No rashes, warm and dry, no lesions  Psych: normal mood and behavior   Assessment/Plan  NSIP Stable per HRCT 11/2020 Decrease in DLCO since last PFT from 63% to 53% Possibly physical deconditioning>> walks 2 miles 3 times a week.  Plan CBC today to rule out anemia Schedule 6 minute walk Consider repeat HRCT Work on slowly increasing activity as you can   [place] with cards>>> last echo 09/2020 PASP has decreased from 43mmHg to 107mmHg and MR is now moderate Plan Continue to follow up with cardiology Annual Echos per cards  Bronchiectasis Stable Plan Mucinex and flutter valve Call at earliest signs of flare  New Wheezing when laying down Concern for GERD Plan Try a 2 week trial of Omeprazole 20 mg daily to see if wheezing resolves. If this helps consider continuing If it does not help, ok to to stop  Health Maintenance Plan Flu vaccine up to date Covid booster up to date  Addendum 11/16 CBC resulted, showing decrease in hemoglobin of about 1 g over the last 7 months.  We will call the patient with the results and asked him to follow-up with GI and regular colonoscopy screening to better evaluate potential cause of anemia. Also his PCP for further evaluation   I spent 45 minutes dedicated to the care of this patient on the date of this encounter to include pre-visit review of records, face-to-face time  with the patient discussing conditions above, post visit ordering of testing, clinical documentation with the electronic health record, making appropriate referrals as documented, and communicating necessary information to the patient's healthcare team.   [name], NP 03/31/2021  2:11 PM

## 2021-03-31 NOTE — Telephone Encounter (Signed)
Please call patient and let him know his hemoglobin has dropped about a gram since March. Hemoglobin today was 11.4 which is a mild anemia. Please ensure that he is having regular screening colonoscopies done. Have him follow-up with his primary care physician for treatment of anemia if she feels this is necessary. This may be playing a part in his increase in dyspnea over the last several months. Tell him that we will review this again but we see him in a month Thanks so much

## 2021-04-01 ENCOUNTER — Telehealth: Payer: Self-pay | Admitting: Acute Care

## 2021-04-01 ENCOUNTER — Other Ambulatory Visit: Payer: PPO

## 2021-04-01 ENCOUNTER — Encounter: Payer: Self-pay | Admitting: Cardiology

## 2021-04-01 ENCOUNTER — Ambulatory Visit (INDEPENDENT_AMBULATORY_CARE_PROVIDER_SITE_OTHER): Payer: PPO | Admitting: Cardiology

## 2021-04-01 ENCOUNTER — Other Ambulatory Visit (HOSPITAL_BASED_OUTPATIENT_CLINIC_OR_DEPARTMENT_OTHER): Payer: Self-pay

## 2021-04-01 VITALS — BP 124/80 | HR 69 | Ht 75.0 in | Wt 172.0 lb

## 2021-04-01 DIAGNOSIS — I080 Rheumatic disorders of both mitral and aortic valves: Secondary | ICD-10-CM

## 2021-04-01 DIAGNOSIS — E7800 Pure hypercholesterolemia, unspecified: Secondary | ICD-10-CM | POA: Diagnosis not present

## 2021-04-01 DIAGNOSIS — I2510 Atherosclerotic heart disease of native coronary artery without angina pectoris: Secondary | ICD-10-CM

## 2021-04-01 DIAGNOSIS — I272 Pulmonary hypertension, unspecified: Secondary | ICD-10-CM

## 2021-04-01 DIAGNOSIS — I4821 Permanent atrial fibrillation: Secondary | ICD-10-CM

## 2021-04-01 LAB — BASIC METABOLIC PANEL
BUN/Creatinine Ratio: 22 (ref 10–24)
BUN: 19 mg/dL (ref 8–27)
CO2: 25 mmol/L (ref 20–29)
Calcium: 9.3 mg/dL (ref 8.6–10.2)
Chloride: 100 mmol/L (ref 96–106)
Creatinine, Ser: 0.86 mg/dL (ref 0.76–1.27)
Glucose: 95 mg/dL (ref 70–99)
Potassium: 4.5 mmol/L (ref 3.5–5.2)
Sodium: 137 mmol/L (ref 134–144)
eGFR: 90 mL/min/{1.73_m2} (ref >59)

## 2021-04-01 LAB — LIPID PANEL
Chol/HDL Ratio: 2.1 ratio (ref 0.0–5.0)
Cholesterol, Total: 132 mg/dL (ref 100–199)
HDL: 64 mg/dL (ref >39)
LDL Chol Calc (NIH): 54 mg/dL (ref 0–99)
Triglycerides: 69 mg/dL (ref 0–149)
VLDL Cholesterol Cal: 14 mg/dL (ref 5–40)

## 2021-04-01 MED ORDER — RIVAROXABAN 20 MG PO TABS
20.0000 mg | ORAL_TABLET | Freq: Every day | ORAL | 3 refills | Status: DC
  Filled 2021-04-01: qty 90, 90d supply, fill #0

## 2021-04-01 MED ORDER — EZETIMIBE 10 MG PO TABS
10.0000 mg | ORAL_TABLET | Freq: Every day | ORAL | 3 refills | Status: DC
  Filled 2021-04-01 – 2021-04-05 (×2): qty 90, 90d supply, fill #0
  Filled 2021-07-30: qty 90, 90d supply, fill #1
  Filled 2021-10-29: qty 90, 90d supply, fill #2
  Filled 2022-01-25: qty 90, 90d supply, fill #3

## 2021-04-01 MED ORDER — ROSUVASTATIN CALCIUM 20 MG PO TABS
20.0000 mg | ORAL_TABLET | Freq: Every day | ORAL | 3 refills | Status: DC
  Filled 2021-04-01: qty 90, 90d supply, fill #0

## 2021-04-01 MED ORDER — FUROSEMIDE 20 MG PO TABS
20.0000 mg | ORAL_TABLET | Freq: Every day | ORAL | 1 refills | Status: DC | PRN
Start: 2021-04-01 — End: 2021-05-04
  Filled 2021-04-01: qty 90, 90d supply, fill #0

## 2021-04-01 NOTE — Telephone Encounter (Signed)
Left detailed message pt is scheduled for 04/16/21 at [facility] st for a Echo, Labs and appt with Dr Radford Pax so they have worked him in at 8:15am

## 2021-04-01 NOTE — Addendum Note (Signed)
Addended by: Antonieta Iba on: 04/01/2021 09:20 AM   Modules accepted: Orders

## 2021-04-01 NOTE — Telephone Encounter (Signed)
Note Please call patient and let him know his hemoglobin has dropped about a gram since March. Hemoglobin today was 11.4 which is a mild anemia. Please ensure that he is having regular screening colonoscopies done. Have him follow-up with his primary care physician for treatment of anemia if she feels this is necessary. This may be playing a part in his increase in dyspnea over the last several months. Tell him that we will review this again but we see him in a month Thanks so much     I have called and spoke with pt and he is aware of SG recs.   He stated that SG wanted him to have the CT done prior to his next OV.  He is wanting to see if we can get that scheduled for him.  Will route to PCC's to get their help.  thanks

## 2021-04-01 NOTE — Telephone Encounter (Signed)
I have called and LM on VM for the pt 
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
## 2021-04-01 NOTE — Addendum Note (Signed)
Addended by: Antonieta Iba on: 04/01/2021 09:16 AM   Modules accepted: Orders

## 2021-04-01 NOTE — Patient Instructions (Signed)
Medication Instructions:  Your physician recommends that you continue on your current medications as directed. Please refer to the Current Medication list given to you today.  *If you need a refill on your cardiac medications before your next appointment, please call your pharmacy*   Lab Work: TODAY: BMET If you have labs (blood work) drawn today and your tests are completely normal, you will receive your results only by: [place] (if you have MyChart) OR A paper copy in the mail If you have any lab test that is abnormal or we need to change your treatment, we will call you to review the results.   Testing/Procedures: Your physician has requested that you have an echocardiogram. Echocardiography is a painless test that uses sound waves to create images of your heart. It provides your doctor with information about the size and shape of your heart and how well your heart's chambers and valves are working. This procedure takes approximately one hour. There are no restrictions for this procedure.  Follow-Up: At [facility], you and your health needs are our priority.  As part of our continuing mission to provide you with exceptional heart care, we have created designated Provider Care Teams.  These Care Teams include your primary Cardiologist (physician) and Advanced Practice Providers (APPs -  Physician Assistants and Nurse Practitioners) who all work together to provide you with the care you need, when you need it.   Your next appointment:   1 year(s)  The format for your next appointment:   In Person  Provider:   Fransico Him, MD

## 2021-04-02 ENCOUNTER — Other Ambulatory Visit (HOSPITAL_BASED_OUTPATIENT_CLINIC_OR_DEPARTMENT_OTHER): Payer: Self-pay

## 2021-04-02 ENCOUNTER — Encounter: Payer: Self-pay | Admitting: Family Medicine

## 2021-04-02 DIAGNOSIS — D649 Anemia, unspecified: Secondary | ICD-10-CM

## 2021-04-02 MED ORDER — MODERNA COVID-19 BIVAL BOOSTER 50 MCG/0.5ML IM SUSP
INTRAMUSCULAR | 0 refills | Status: DC
  Filled 2021-04-02: qty 0.5, 1d supply, fill #0

## 2021-04-03 NOTE — Addendum Note (Signed)
Addended by: Lamar Blinks C on: 04/03/2021 05:27 PM   Modules accepted: Orders

## 2021-04-05 ENCOUNTER — Ambulatory Visit (INDEPENDENT_AMBULATORY_CARE_PROVIDER_SITE_OTHER): Payer: PPO

## 2021-04-05 ENCOUNTER — Other Ambulatory Visit (HOSPITAL_BASED_OUTPATIENT_CLINIC_OR_DEPARTMENT_OTHER): Payer: Self-pay

## 2021-04-05 VITALS — Ht 75.0 in | Wt 161.0 lb

## 2021-04-05 DIAGNOSIS — Z0000 Encounter for general adult medical examination without abnormal findings: Secondary | ICD-10-CM
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
## 2021-04-05 NOTE — Patient Instructions (Signed)
[name] , Thank you for taking time to complete your Medicare Wellness Visit. I appreciate your ongoing commitment to your health goals. Please review the following plan we discussed and let me know if I can assist you in the future.   Screening recommendations/referrals: Colonoscopy: No longer required Recommended yearly ophthalmology/optometry visit for glaucoma screening and checkup Recommended yearly dental visit for hygiene and checkup  Vaccinations: Influenza vaccine: Up to date Pneumococcal vaccine: Up to date Tdap vaccine: Up to date Shingles vaccine: Completed vaccines   Covid-19: Up to date  Advanced directives: Copy in chart  Conditions/risks identified: See problem list  Next appointment: Follow up in one year for your annual wellness visit.   Preventive Care 76 Years and Older, Male Preventive care refers to lifestyle choices and visits with your health care provider that can promote health and wellness. What does preventive care include? A yearly physical exam. This is also called an annual well check. Dental exams once or twice a year. Routine eye exams. Ask your health care provider how often you should have your eyes checked. Personal lifestyle choices, including: Daily care of your teeth and gums. Regular physical activity. Eating a healthy diet. Avoiding tobacco and drug use. Limiting alcohol use. Practicing safe sex. Taking low doses of aspirin every day. Taking vitamin and mineral supplements as recommended by your health care provider. What happens during an annual well check? The services and screenings done by your health care provider during your annual well check will depend on your age, overall health, lifestyle risk factors, and family history of disease. Counseling  Your health care provider may ask you questions about your: Alcohol use. Tobacco use. Drug use. Emotional well-being. Home and relationship well-being. Sexual  activity. Eating habits. History of falls. Memory and ability to understand (cognition). Work and work Statistician. Screening  You may have the following tests or measurements: Height, weight, and BMI. Blood pressure. Lipid and cholesterol levels. These may be checked every 5 years, or more frequently if you are over 76 years old. Skin check. Lung cancer screening. You may have this screening every year starting at age 76 if you have a 30-pack-year history of smoking and currently smoke or have quit within the past 15 years. Fecal occult blood test (FOBT) of the stool. You may have this test every year starting at age 76. Flexible sigmoidoscopy or colonoscopy. You may have a sigmoidoscopy every 5 years or a colonoscopy every 10 years starting at age 76. Prostate cancer screening. Recommendations will vary depending on your family history and other risks. Hepatitis C blood test. Hepatitis B blood test. Sexually transmitted disease (STD) testing. Diabetes screening. This is done by checking your blood sugar (glucose) after you have not eaten for a while (fasting). You may have this done every 1-3 years. Abdominal aortic aneurysm (AAA) screening. You may need this if you are a current or former smoker. Osteoporosis. You may be screened starting at age 76 if you are at high risk. Talk with your health care provider about your test results, treatment options, and if necessary, the need for more tests. Vaccines  Your health care provider may recommend certain vaccines, such as: Influenza vaccine. This is recommended every year. Tetanus, diphtheria, and acellular pertussis (Tdap, Td) vaccine. You may need a Td booster every 10 years. Zoster vaccine. You may need this after age 76. Pneumococcal 13-valent conjugate (PCV13) vaccine. One dose is recommended after age 76. Pneumococcal polysaccharide (PPSV23) vaccine. One dose is recommended after  age 50. Talk to your health care provider about which  screenings and vaccines you need and how often you need them. This information is not intended to replace advice given to you by your health care provider. Make sure you discuss any questions you have with your health care provider. Document Released: 05/29/2015 Document Revised: 01/20/2016 Document Reviewed: 03/03/2015 Elsevier Interactive Patient Education  2017 [place] Prevention in the Home Falls can cause injuries. They can happen to people of all ages. There are many things you can do to make your home safe and to help prevent falls. What can I do on the outside of my home? Regularly fix the edges of walkways and driveways and fix any cracks. Remove anything that might make you trip as you walk through a door, such as a raised step or threshold. Trim any bushes or trees on the path to your home. Use bright outdoor lighting. Clear any walking paths of anything that might make someone trip, such as rocks or tools. Regularly check to see if handrails are loose or broken. Make sure that both sides of any steps have handrails. Any raised decks and porches should have guardrails on the edges. Have any leaves, snow, or ice cleared regularly. Use sand or salt on walking paths during winter. Clean up any spills in your garage right away. This includes oil or grease spills. What can I do in the bathroom? Use night lights. Install grab bars by the toilet and in the tub and shower. Do not use towel bars as grab bars. Use non-skid mats or decals in the tub or shower. If you need to sit down in the shower, use a plastic, non-slip stool. Keep the floor dry. Clean up any water that spills on the floor as soon as it happens. Remove soap buildup in the tub or shower regularly. Attach bath mats securely with double-sided non-slip rug tape. Do not have throw rugs and other things on the floor that can make you trip. What can I do in the bedroom? Use night lights. Make sure that you have a  light by your bed that is easy to reach. Do not use any sheets or blankets that are too big for your bed. They should not hang down onto the floor. Have a firm chair that has side arms. You can use this for support while you get dressed. Do not have throw rugs and other things on the floor that can make you trip. What can I do in the kitchen? Clean up any spills right away. Avoid walking on wet floors. Keep items that you use a lot in easy-to-reach places. If you need to reach something above you, use a strong step stool that has a grab bar. Keep electrical cords out of the way. Do not use floor polish or wax that makes floors slippery. If you must use wax, use non-skid floor wax. Do not have throw rugs and other things on the floor that can make you trip. What can I do with my stairs? Do not leave any items on the stairs. Make sure that there are handrails on both sides of the stairs and use them. Fix handrails that are broken or loose. Make sure that handrails are as long as the stairways. Check any carpeting to make sure that it is firmly attached to the stairs. Fix any carpet that is loose or worn. Avoid having throw rugs at the top or bottom of the stairs. If you do  have throw rugs, attach them to the floor with carpet tape. Make sure that you have a light switch at the top of the stairs and the bottom of the stairs. If you do not have them, ask someone to add them for you. What else can I do to help prevent falls? Wear shoes that: Do not have high heels. Have rubber bottoms. Are comfortable and fit you well. Are closed at the toe. Do not wear sandals. If you use a stepladder: Make sure that it is fully opened. Do not climb a closed stepladder. Make sure that both sides of the stepladder are locked into place. Ask someone to hold it for you, if possible. Clearly mark and make sure that you can see: Any grab bars or handrails. First and last steps. Where the edge of each step  is. Use tools that help you move around (mobility aids) if they are needed. These include: Canes. Walkers. Scooters. Crutches. Turn on the lights when you go into a dark area. Replace any light bulbs as soon as they burn out. Set up your furniture so you have a clear path. Avoid moving your furniture around. If any of your floors are uneven, fix them. If there are any pets around you, be aware of where they are. Review your medicines with your doctor. Some medicines can make you feel dizzy. This can increase your chance of falling. Ask your doctor what other things that you can do to help prevent falls. This information is not intended to replace advice given to you by your health care provider. Make sure you discuss any questions you have with your health care provider. Document Released: 02/26/2009 Document Revised: 10/08/2015 Document Reviewed: 06/06/2014 Elsevier Interactive Patient Education  2017 [employer].

## 2021-04-06 DIAGNOSIS — Z85828 Personal history of other malignant neoplasm of skin: Secondary | ICD-10-CM | POA: Diagnosis not present

## 2021-04-06 DIAGNOSIS — L309 Dermatitis, unspecified: Secondary | ICD-10-CM | POA: Diagnosis not present

## 2021-04-06 DIAGNOSIS — L570 Actinic keratosis: Secondary | ICD-10-CM | POA: Diagnosis not present

## 2021-04-06 DIAGNOSIS — L814 Other melanin hyperpigmentation: Secondary | ICD-10-CM | POA: Diagnosis not present

## 2021-04-06 DIAGNOSIS — L718 Other rosacea: Secondary | ICD-10-CM | POA: Diagnosis not present

## 2021-04-06 DIAGNOSIS — L298 Other pruritus: Secondary | ICD-10-CM | POA: Diagnosis not present

## 2021-04-06 DIAGNOSIS — L218 Other seborrheic dermatitis: Secondary | ICD-10-CM | POA: Diagnosis not present

## 2021-04-06 DIAGNOSIS — D225 Melanocytic nevi of trunk: Secondary | ICD-10-CM | POA: Diagnosis not present

## 2021-04-06 DIAGNOSIS — L821 Other seborrheic keratosis: Secondary | ICD-10-CM | POA: Diagnosis not present

## 2021-04-06 DIAGNOSIS — Z08 Encounter for follow-up examination after completed treatment for malignant neoplasm: Secondary | ICD-10-CM | POA: Diagnosis not present

## 2021-04-06 DIAGNOSIS — L853 Xerosis cutis: Secondary | ICD-10-CM | POA: Diagnosis not present

## 2021-04-06 DIAGNOSIS — L819 Disorder of pigmentation, unspecified: Secondary | ICD-10-CM | POA: Diagnosis not present

## 2021-04-16 ENCOUNTER — Other Ambulatory Visit: Payer: Self-pay

## 2021-04-16 ENCOUNTER — Ambulatory Visit (HOSPITAL_COMMUNITY): Payer: PPO | Attending: Internal Medicine

## 2021-04-16 ENCOUNTER — Ambulatory Visit (INDEPENDENT_AMBULATORY_CARE_PROVIDER_SITE_OTHER)
Admission: RE | Admit: 2021-04-16 | Discharge: 2021-04-16 | Disposition: A | Discharge status: Home / Self Care | Payer: PPO | Source: Ambulatory Visit | Attending: Acute Care | Admitting: Acute Care

## 2021-04-16 DIAGNOSIS — I080 Rheumatic disorders of both mitral and aortic valves: Secondary | ICD-10-CM | POA: Insufficient documentation

## 2021-04-16 DIAGNOSIS — I272 Pulmonary hypertension, unspecified: Secondary | ICD-10-CM | POA: Diagnosis not present

## 2021-04-16 DIAGNOSIS — I4821 Permanent atrial fibrillation: Secondary | ICD-10-CM

## 2021-04-16 DIAGNOSIS — J849 Interstitial pulmonary disease, unspecified: Secondary | ICD-10-CM | POA: Diagnosis not present

## 2021-04-16 DIAGNOSIS — E7800 Pure hypercholesterolemia, unspecified: Secondary | ICD-10-CM | POA: Diagnosis not present

## 2021-04-16 DIAGNOSIS — I2510 Atherosclerotic heart disease of native coronary artery without angina pectoris: Secondary | ICD-10-CM

## 2021-04-16 DIAGNOSIS — I700 Atherosclerosis of aorta: Secondary | ICD-10-CM | POA: Diagnosis not present

## 2021-04-16 DIAGNOSIS — I517 Cardiomegaly: Secondary | ICD-10-CM | POA: Diagnosis not present

## 2021-04-16 DIAGNOSIS — J479 Bronchiectasis, uncomplicated: Secondary | ICD-10-CM | POA: Diagnosis not present

## 2021-04-16 LAB — ECHOCARDIOGRAM COMPLETE
Area-P 1/2: 3.88 cm2
S' Lateral: 2.9 cm

## 2021-04-23 ENCOUNTER — Encounter: Payer: Self-pay | Admitting: Family Medicine

## 2021-04-23 NOTE — Telephone Encounter (Signed)
Will forward to SG. Please advise. Thanks

## 2021-05-03 ENCOUNTER — Ambulatory Visit (INDEPENDENT_AMBULATORY_CARE_PROVIDER_SITE_OTHER): Payer: PPO

## 2021-05-03 ENCOUNTER — Other Ambulatory Visit: Payer: Self-pay

## 2021-05-03 ENCOUNTER — Encounter: Payer: Self-pay | Admitting: Acute Care

## 2021-05-03 ENCOUNTER — Ambulatory Visit (INDEPENDENT_AMBULATORY_CARE_PROVIDER_SITE_OTHER): Payer: PPO | Admitting: Acute Care

## 2021-05-03 VITALS — BP 114/72 | HR 58 | Temp 98.0°F | Ht 75.0 in | Wt 171.8 lb

## 2021-05-03 DIAGNOSIS — J8489 Other specified interstitial pulmonary diseases: Secondary | ICD-10-CM

## 2021-05-03 DIAGNOSIS — J849 Interstitial pulmonary disease, unspecified: Secondary | ICD-10-CM

## 2021-05-03 DIAGNOSIS — J479 Bronchiectasis, uncomplicated: Secondary | ICD-10-CM | POA: Diagnosis not present

## 2021-05-03 DIAGNOSIS — Z0000 Encounter for general adult medical examination without abnormal findings: Secondary | ICD-10-CM

## 2021-05-03 DIAGNOSIS — I2721 Secondary pulmonary arterial hypertension: Secondary | ICD-10-CM

## 2021-05-03 NOTE — Progress Notes (Signed)
History of Present Illness Troy Willis is a 75 y.o. 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 Marland KitchenHe is followed by Dr. Valeta Harms   Synopsis He had a rotator cuff surgery 04/2020 which did impact his abilty exercise. He walks daily.  Pt. Was diagnosed with Covid Pneumonia 09/23/20. He was treated with Paxlovid and Levaquin.    He was almost back to baseline when he fell putting a new boat cover on his boat. He fell 6/14 and fractured the left eighth through eleventh posterolateral ribs.  He went to [place] in September. He had the flu 03/03/2021 and was treated with Tamiflu. 03/10/2021 he received his Covid Booster.  Recent Echo 04/2021 shows there is moderately elevated pulmonary artery systolic pressure.  estimated right ventricular systolic pressure is 05.1 mmHg. ( Has ranged from 40-48 mm Hg)He also has some Mitral regurgitation that is now moderate to severe. He is scheduled for a TEE.   05/03/2021 Pt. Presents for follow up. He was walked today in the office, and did 18 laps at a rapid pace and did not drop his sats. He states he has been doing well. He has mitral valve regurgitation which is being further evaluated. Most recent echo 04/2021 shows mitral regurgitation has worsened, PA pressures are higher , and there was notation of a possible PFO. He has been scheduled for a TEE. He states his breathing is good. He does have some wheezing at times. The prilosec we had tried fro night time wheezing didn't make any difference, so he has discontinued it.  He does note that he coughs more at bedtime. He has been coughing a bit more, but cough is non-productive. We discussed a prednisone taper and he declined, but stated he would let us know if this got worse and he would like to try it.   Test Results: HRCT Chest 04/2021 Mediastinum/Nodes: No pathologically enlarged mediastinal or hilar lymph nodes. Please note that accurate exclusion of  hilar adenopathy is limited on noncontrast CT scans. Esophagus is unremarkable in appearance. No axillary lymphadenopathy.   Lungs/Pleura: High-resolution images demonstrate widespread but patchy areas of mild ground-glass attenuation, extensive septal thickening, subpleural reticulation, thickening of the peribronchovascular interstitium and mild cylindrical traction bronchiectasis. No frank honeycombing. Findings have a definitive craniocaudal gradient. Inspiratory and expiratory imaging demonstrates very mild air trapping.  The appearance of the lungs is compatible with interstitial lung disease, categorized as probable usual interstitial pneumonia (UIP) per current ATS guidelines. Mild air trapping indicative of mild small airways disease. Cardiomegaly with biatrial dilatation.    Echo 04/16/2021 Left ventricular ejection fraction, by estimation, is 60 to 65%. The left ventricle has normal function. The left ventricle has no regional wall motion abnormalities. There is mild left ventricular hypertrophy. Left ventricular diastolic parameters are indeterminate. Right ventricular systolic function is mildly reduced. The right ventricular size is mildly enlarged. There is moderately elevated pulmonary artery systolic pressure. The estimated right ventricular systolic pressure is 83.3 mmHg. Left atrial size was severely dilated. Right atrial size was moderately dilated. Bileaflet mitral valve prolapse. The mitral valve is myxomatous. Moderate to severe mitral valve regurgitation. No evidence of mitral stenosis. Tricuspid valve regurgitation is moderate. The aortic valve is normal in structure. Aortic valve regurgitation is trivial. No aortic stenosis is present. Aortic root and ascending aorta measure 37 mm, which is upper limit of normal for age when indexed to [place]. 8. The inferior vena cava is dilated in size with >50% respiratory variability,  suggesting right atrial pressure of  8 mmHg. 9. 10. Cannot exclude a small PFO.   CBC Latest Ref Rng & Units 03/31/2021 08/02/2020 04/02/2020  WBC 4.0 - 10.5 K/uL 5.7 9.2 6.6  Hemoglobin 13.0 - 17.0 g/dL 11.4(L) 12.6(L) 11.5(L)  Hematocrit 39.0 - 52.0 % 34.7(L) 40.0 34.9(L)  Platelets 150.0 - 400.0 K/uL 144.0(L) 235 183    BMP Latest Ref Rng & Units 04/01/2021 08/02/2020 04/02/2020  Glucose 70 - 99 mg/dL 95 124(H) 100(H)  BUN 8 - 27 mg/dL 19 17 20   Creatinine 0.76 - 1.27 mg/dL 0.86 0.83 0.67  BUN/Creat Ratio 10 - 24 22 - -  Sodium 134 - 144 mmol/L 137 131(L) 138  Potassium 3.5 - 5.2 mmol/L 4.5 4.1 4.7  Chloride 96 - 106 mmol/L 100 99 103  CO2 20 - 29 mmol/L 25 24 28   Calcium 8.6 - 10.2 mg/dL 9.3 9.2 9.2    BNP No results found for: BNP  ProBNP    Component Value Date/Time   PROBNP 225.0 (H) 03/26/2019 0909    PFT    Component Value Date/Time   FEV1PRE 2.65 03/23/2021 0857   FEV1POST 2.78 03/23/2021 0857   FVCPRE 3.62 03/23/2021 0857   FVCPOST 3.73 03/23/2021 0857   TLC 5.83 03/23/2021 0857   DLCOUNC 15.10 03/23/2021 0857   PREFEV1FVCRT 73 03/23/2021 0857   PSTFEV1FVCRT 74 03/23/2021 0857    CT CHEST HIGH RESOLUTION  Result Date: 04/16/2021 CLINICAL DATA:  75 year old male with history of worsening shortness of breath. Evaluate for bronchiectasis or interstitial lung disease. EXAM: CT CHEST WITHOUT CONTRAST TECHNIQUE: Multidetector CT imaging of the chest was performed following the standard protocol without intravenous contrast. High resolution imaging of the lungs, as well as inspiratory and expiratory imaging, was performed. COMPARISON:  Chest CT 11/18/2020. FINDINGS: Cardiovascular: Heart size is mildly enlarged with biatrial dilatation. There is no significant pericardial fluid, thickening or pericardial calcification. There is aortic atherosclerosis, as well as atherosclerosis of the great vessels of the mediastinum and the coronary arteries, including calcified atherosclerotic plaque in the left and  left anterior descending, left circumflex and right coronary arteries. Mediastinum/Nodes: No pathologically enlarged mediastinal or hilar lymph nodes. Please note that accurate exclusion of hilar adenopathy is limited on noncontrast CT scans. Esophagus is unremarkable in appearance. No axillary lymphadenopathy. Lungs/Pleura: High-resolution images demonstrate widespread but patchy areas of mild ground-glass attenuation, extensive septal thickening, subpleural reticulation, thickening of the peribronchovascular interstitium and mild cylindrical traction bronchiectasis. No frank honeycombing. Findings have a definitive craniocaudal gradient. Inspiratory and expiratory imaging demonstrates very mild air trapping. Upper Abdomen: Aortic atherosclerosis. Musculoskeletal: There are no aggressive appearing lytic or blastic lesions noted in the visualized portions of the skeleton. IMPRESSION: 1. The appearance of the lungs is compatible with interstitial lung disease, categorized as probable usual interstitial pneumonia (UIP) per current ATS guidelines. 2. Mild air trapping indicative of mild small airways disease. 3. Cardiomegaly with biatrial dilatation. Aortic Atherosclerosis (ICD10-I70.0). Electronically Signed   By: Vinnie Langton M.D.   On: 04/16/2021 15:49   ECHOCARDIOGRAM COMPLETE  Result Date: 04/16/2021    ECHOCARDIOGRAM REPORT   Patient Name:   ISAIYAH FELDHAUS Date of Exam: 04/16/2021 Medical Rec #:  [id number]            Height:       75.0 in Accession #:    [id number]           Weight:       161.0 lb Date of Birth:  19-Nov-1945  BSA:          2.001 m Patient Age:    89 years             BP:           124/80 mmHg Patient Gender: M                    HR:           55 bpm. Exam Location:  [place] Procedure: 2D Echo, Cardiac Doppler and Color Doppler Indications:    I25.10 CAD  History:        Patient has prior history of Echocardiogram examinations, most                 recent 09/22/2020.  CAD, Pulmonary HTN, MR, Arrythmias:Atrial                 Fibrillation; Risk Factors:Dyslipidemia.  Sonographer:    Coralyn Helling RDCS Referring Phys: [place]  1. Left ventricular ejection fraction, by estimation, is 60 to 65%. The left ventricle has normal function. The left ventricle has no regional wall motion abnormalities. There is mild left ventricular hypertrophy. Left ventricular diastolic parameters are indeterminate.  2. Right ventricular systolic function is mildly reduced. The right ventricular size is mildly enlarged. There is moderately elevated pulmonary artery systolic pressure. The estimated right ventricular systolic pressure is 84.1 mmHg.  3. Left atrial size was severely dilated.  4. Right atrial size was moderately dilated.  5. Bileaflet mitral valve prolapse. The mitral valve is myxomatous. Moderate to severe mitral valve regurgitation. No evidence of mitral stenosis.  6. Tricuspid valve regurgitation is moderate.  7. The aortic valve is normal in structure. Aortic valve regurgitation is trivial. No aortic stenosis is present.  8. Aortic root and ascending aorta measure 37 mm, which is upper limit of normal for age when indexed to [place].  9. The inferior vena cava is dilated in size with >50% respiratory variability, suggesting right atrial pressure of 8 mmHg. 10. Cannot exclude a small PFO. FINDINGS  Left Ventricle: Left ventricular ejection fraction, by estimation, is 60 to 65%. The left ventricle has normal function. The left ventricle has no regional wall motion abnormalities. The left ventricular internal cavity size was normal in size. There is  mild left ventricular hypertrophy. Left ventricular diastolic parameters are indeterminate. Right Ventricle: The right ventricular size is mildly enlarged. No increase in right ventricular wall thickness. Right ventricular systolic function is mildly reduced. There is moderately elevated pulmonary artery systolic pressure.  The tricuspid regurgitant velocity is 3.05 m/s, and with an assumed right atrial pressure of 8 mmHg, the estimated right ventricular systolic pressure is 66.0 mmHg. Left Atrium: Left atrial size was severely dilated. Right Atrium: Right atrial size was moderately dilated. Prominent Eustachian valve. Pericardium: There is no evidence of pericardial effusion. Mitral Valve: Bileaflet mitral valve prolapse. The mitral valve is myxomatous. Moderate to severe mitral valve regurgitation. No evidence of mitral valve stenosis. Tricuspid Valve: The tricuspid valve is normal in structure. Tricuspid valve regurgitation is moderate . No evidence of tricuspid stenosis. Aortic Valve: The aortic valve is normal in structure. Aortic valve regurgitation is trivial. No aortic stenosis is present. Pulmonic Valve: The pulmonic valve was normal in structure. Pulmonic valve regurgitation is mild. No evidence of pulmonic stenosis. Aorta: Aortic root and ascending aorta measure 37 mm, which is upper limit of normal for age when indexed to [place]. The aortic root  is normal in size and structure. Venous: The inferior vena cava is dilated in size with greater than 50% respiratory variability, suggesting right atrial pressure of 8 mmHg. IAS/Shunts: Cannot exclude a small PFO.  LEFT VENTRICLE PLAX 2D LVIDd:         4.70 cm   Diastology LVIDs:         2.90 cm   LV e' medial:    13.63 cm/s LV PW:         1.20 cm   LV E/e' medial:  8.1 LV IVS:        1.30 cm   LV e' lateral:   22.23 cm/s LVOT diam:     2.00 cm   LV E/e' lateral: 5.0 LV SV:         42 LV SV Index:   21 LVOT Area:     3.14 cm  RIGHT VENTRICLE RVSP:           45.2 mmHg LEFT ATRIUM              Index        RIGHT ATRIUM           Index LA diam:        4.70 cm  2.35 cm/m   RA Pressure: 8.00 mmHg LA Vol (A2C):   130.0 ml 64.96 ml/m  RA Area:     20.60 cm LA Vol (A4C):   83.3 ml  41.63 ml/m  RA Volume:   63.70 ml  31.83 ml/m LA Biplane Vol: 114.0 ml 56.97 ml/m  AORTIC VALVE LVOT Vmax:    60.22 cm/s LVOT Vmean:  40.620 cm/s LVOT VTI:    0.134 m  AORTA Ao Root diam: 3.70 cm Ao Asc diam:  3.70 cm MITRAL VALVE                TRICUSPID VALVE MV Area (PHT): 3.88 cm     TR Peak grad:   37.2 mmHg MV Decel Time: 195 msec     TR Vmax:        305.00 cm/s MV E velocity: 110.24 cm/s  Estimated RAP:  8.00 mmHg                             RVSP:           45.2 mmHg                              SHUNTS                             Systemic VTI:  0.13 m                             Systemic Diam: 2.00 cm Cherlynn Kaiser MD Electronically signed by Cherlynn Kaiser MD Signature Date/Time: 04/16/2021/12:30:22 PM    Final      Past medical hx Past Medical History:  Diagnosis Date   Acquired dilation of ascending aorta and aortic root (HCC)    20mm ascending aorta by echo 09/2020   Anemia    mild    Arthritis    hands, lower back   Bronchiectasis (HCC)    CAD (coronary artery disease), native coronary artery    a. 08/2015 Cath/PCI: LM nl, LAD 64m, LCX small,  nl, RCA 80p (4.0x16 Synergy DES),    Cancer (HCC)    skin on forehead   Cataracts, bilateral    surgery   Depression    pt denies    Diverticulosis    Dyspnea    occasional    GERD (gastroesophageal reflux disease)    Hilar adenopathy    a. 08/2015 CT Chest: mild bilat hilar adenopathy and mild soft tissue prominence in inf right hilum, left hilum. Also areas of soft tissue and low-density material filling LLL bronchial airways->? mucous vs mass.   Hx of adenomatous colonic polyps 08/13/2015   Hyperlipidemia    MVP (mitral valve prolapse)    bileaflet with  moderate MR by echo 09/2020   Myocardial infarction [facility])    Nonspecific elevation of levels of transaminase or lactic acid dehydrogenase (LDH)    PMH of ; ? 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 CHA2DS2VASc = 2 -->Xarelto started 08/2015.   Pneumonia, bacterial 05/18/2012   Pulmonary HTN (HCC)    PASP 82mmHg on echo  08/2019>>Likely Group 3 related to bronchiectasis>>repeat echo 09/2020 with PASP 56mmhg   Pulmonary nodules    a. 08/2015 CT Chest: reticulonodular opacities in RUL - largest 74mm - likely 2/2 inflammatory process.   Stroke [facility])    Tricuspid regurgitation      Social History   Tobacco Use   Smoking status: Never   Smokeless tobacco: Former    Types: Chew    Quit date: 05/16/1978   Tobacco comments:    occasional cigar in past; not regular smoker  Vaping Use   Vaping Use: Never used  Substance Use Topics   Alcohol use: Yes    Comment: 5 beers per week    Drug use: No    Mr.Falotico reports that he has never smoked. He quit smokeless tobacco use about 42 years ago.  His smokeless tobacco use included chew. He reports current alcohol use. He reports that he does not use drugs.  Tobacco Cessation: Never smoker Past surgical hx, Family hx, Social hx all reviewed.  Current Outpatient Medications on File Prior to Visit  Medication Sig   Cholecalciferol (VITAMIN D3) 2000 UNITS TABS Take 2,000 Units by mouth daily.    COVID-19 mRNA bivalent vaccine, Moderna, (MODERNA COVID-19 BIVAL BOOSTER) 50 MCG/0.5ML injection Inject into the muscle.   ezetimibe (ZETIA) 10 MG tablet Take 1 tablet (10 mg total) by mouth daily.   famotidine (PEPCID) 20 MG tablet Take 20 mg by mouth daily as needed for heartburn or indigestion.   FLUAD QUADRIVALENT 0.5 ML injection    furosemide (LASIX) 20 MG tablet Take 1 tablet (20 mg total) by mouth daily as needed for fluid or edema.   Guaifenesin (MUCINEX MAXIMUM STRENGTH) 1200 MG TB12 Take 1,200 mg by mouth at bedtime.   hydrocortisone 2.5 % ointment Apply 1 application topically 2 (two) times daily as needed (irritation).    levofloxacin (LEVAQUIN) 750 MG tablet Take 1 tablet (750 mg total) by mouth daily.   Melatonin 5 MG CAPS Take 5 mg by mouth at bedtime.   nitroGLYCERIN (NITROSTAT) 0.4 MG SL tablet Place 1 tablet (0.4 mg total) under the tongue every 5 (five)  minutes x 3 doses as needed for chest pain.   omeprazole (PRILOSEC) 20 MG capsule Take 20 mg by mouth daily.   oseltamivir (TAMIFLU) 75 MG capsule Take 1 capsule (75 mg total) by mouth 2 (two) times  daily.   Respiratory Therapy Supplies (FLUTTER) DEVI Use as directed   rivaroxaban (XARELTO) 20 MG TABS tablet Take 1 tablet (20 mg total) by mouth daily.   rosuvastatin (CRESTOR) 20 MG tablet Take 1 tablet (20 mg total) by mouth daily.   triamcinolone cream (KENALOG) 0.1 % Apply 1 application topically daily as needed (rash).    Current Facility-Administered Medications on File Prior to Visit  Medication   gemcitabine (GEMZAR) chemo syringe for bladder instillation 2,000 mg     Allergies  Allergen Reactions   Amiodarone Other (See Comments)    elevated liver enzymes   Atorvastatin Other (See Comments)    Nocturnal leg cramps   Black Pepper-Turmeric Other (See Comments)    heartburn   Metoprolol Other (See Comments)    Hypotension     Review Of Systems:  Constitutional:   No  weight loss, night sweats,  Fevers, chills, fatigue, or  lassitude.  HEENT:   No headaches,  Difficulty swallowing,  Tooth/dental problems, or  Sore throat,                No sneezing, itching, ear ache, nasal congestion, post nasal drip,   CV:  No chest pain,  Orthopnea, PND, swelling in lower extremities, anasarca, dizziness, palpitations, syncope.   GI  No heartburn, indigestion, abdominal pain, nausea, vomiting, diarrhea, change in bowel habits, loss of appetite, bloody stools.   Resp: + shortness of breath with exertion none at rest.  No excess mucus, no productive cough,  + non-productive cough,  No coughing up of blood.  No change in color of mucus.  +  wheezing when laying down.  No chest wall deformity  Skin: no rash or lesions.  GU: no dysuria, change in color of urine, no urgency or frequency.  No flank pain, no hematuria   MS:  No joint pain or swelling.  No decreased range of motion.  No back  pain.  Psych:  No change in mood or affect. No depression or anxiety.  No memory loss.   Vital Signs BP 114/72 (BP Location: Left Arm, Cuff Size: Normal)    Pulse (!) 58    Temp 98 F (36.7 C) (Oral)    Ht 6\' 3"  (1.905 m)    Wt 171 lb 12.8 oz (77.9 kg)    SpO2 99%    BMI 21.47 kg/m    Physical Exam:  General- No distress,  A&Ox3,. pleasant ENT: No sinus tenderness, TM clear, pale nasal mucosa, no oral exudate,no post nasal drip, no LAN Cardiac: S1, S2, regular rate and rhythm, no murmur Chest: No wheeze/ rales/ dullness; no accessory muscle use, no nasal flaring, no sternal retractions, crackles per bases.  Abd.: Soft Non-tender, ND, BS +, Body mass index is 21.47 kg/m. Ext: No clubbing cyanosis, edema Neuro:  normal strength, MAE x 4, A&O x 3 Skin: No rashes, warm and dry, no lesions  Psych: normal mood and behavior   Assessment/Plan NSIP HRCT 04/2021 does not specify progression  Decrease in DLCO since last PFT from 63% to 53% Possibly physical deconditioning>> walks 2 miles 3 times a week.  Plan CBC did show drop of 1 gram in HGB since previous testing >> Follow up GI.  Schedule 6 minute walk>> walked 18 laps with no desats  Work on slowly increasing activity as you can  Follow up HRCT as needed    [address] with cards>>> last echo 04/2021 PASP has increased from 10mm Hg to 45 mm Hg  and MR is now moderate to severe ? PFO Plan Following up with cards.>> TEE planned    Bronchiectasis Stable Plan Mucinex and flutter valve Call at earliest signs of flare   New Wheezing when laying down Concern for GERD Plan 2 week trial of Omeprazole 20 mg daily  did not make any difference>> Has stopped.   Health Maintenance Plan Flu vaccine up to date Covid booster up to date  I spent 45  minutes dedicated to the care of this patient on the date of this encounter to include pre-visit review of records, face-to-face time with the patient discussing conditions above, post  visit ordering of testing, clinical documentation with the electronic health record, making appropriate referrals as documented, and communicating necessary information to the patient's healthcare team.      [name], NP 05/03/2021  1:01 PM

## 2021-05-03 NOTE — Progress Notes (Addendum)
°   Six Minute Walk - 05/03/21 0931       Six Minute Walk   Medications taken before test (dose and time) none    Supplemental oxygen during test? No    Lap distance in meters  34 meters    Laps Completed 18    Partial lap (in meters) 0 meters    Baseline BP (sitting) 128/72    Baseline Heartrate 58    Baseline Dyspnea (Borg Scale) 0.5    Baseline Fatigue (Borg Scale) 0    Baseline SPO2 98 %      Interval Oxygen Saturation and HR    2 Minute Oxygen Saturation % 98 %    2 Minute HR 74      End of Test Values    BP (sitting) 136/74    Heartrate 85    Dyspnea (Borg Scale) 3    Fatigue (Borg Scale) 2    SPO2 97 %      2 Minutes Post Walk Values   BP (sitting) 140/80    Heartrate 74    SPO2 98 %      Interpretation   Distance completed 612 meters    Tech Comments: Pt was able to complete 18 laps without any stops at fast pace.

## 2021-05-03 NOTE — Patient Instructions (Addendum)
It is good to see you today. We will order a home sleep study to assess you for OSA. You will get a call to schedule this.  Troy Willis with your TEE. Let me know if you feel you need to do a prednisone taper.  Let us know if anything changes.  We want to be proactive if things change.  Follow up in 6 months with Dr. Valeta Harms of [name] NP Please contact office for sooner follow up if symptoms do not improve or worsen or seek emergency care   Have a great holiday.

## 2021-05-04 ENCOUNTER — Other Ambulatory Visit (HOSPITAL_BASED_OUTPATIENT_CLINIC_OR_DEPARTMENT_OTHER): Payer: Self-pay

## 2021-05-04 ENCOUNTER — Ambulatory Visit (HOSPITAL_BASED_OUTPATIENT_CLINIC_OR_DEPARTMENT_OTHER): Payer: PPO | Admitting: Family

## 2021-05-04 VITALS — BP 134/82 | HR 62 | Ht 75.0 in | Wt 172.8 lb

## 2021-05-04 DIAGNOSIS — H0102B Squamous blepharitis left eye, upper and lower eyelids: Secondary | ICD-10-CM | POA: Diagnosis not present

## 2021-05-04 DIAGNOSIS — I272 Pulmonary hypertension, unspecified: Secondary | ICD-10-CM

## 2021-05-04 DIAGNOSIS — I4821 Permanent atrial fibrillation: Secondary | ICD-10-CM

## 2021-05-04 DIAGNOSIS — H0102A Squamous blepharitis right eye, upper and lower eyelids: Secondary | ICD-10-CM | POA: Diagnosis not present

## 2021-05-04 DIAGNOSIS — D6859 Other primary thrombophilia: Secondary | ICD-10-CM

## 2021-05-04 DIAGNOSIS — E782 Mixed hyperlipidemia: Secondary | ICD-10-CM | POA: Diagnosis not present

## 2021-05-04 DIAGNOSIS — I340 Nonrheumatic mitral (valve) insufficiency: Secondary | ICD-10-CM | POA: Diagnosis not present

## 2021-05-04 DIAGNOSIS — H00024 Hordeolum internum left upper eyelid: Secondary | ICD-10-CM | POA: Diagnosis not present

## 2021-05-04 MED ORDER — FUROSEMIDE 20 MG PO TABS: 20.0000 mg | ORAL_TABLET | Freq: Every day | ORAL | 1 refills | Status: DC

## 2021-05-04 MED ORDER — DOXYCYCLINE MONOHYDRATE 100 MG PO TABS
100.0000 mg | ORAL_TABLET | Freq: Two times a day (BID) | ORAL | 1 refills | Status: DC
  Filled 2021-05-04: qty 10, 5d supply, fill #0

## 2021-05-04 MED ORDER — NEOMYCIN-POLYMYXIN-DEXAMETH 3.5-10000-0.1 OP OINT
TOPICAL_OINTMENT | OPHTHALMIC | 1 refills | Status: DC
  Filled 2021-05-04: qty 3.5, 30d supply, fill #0
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
## 2021-05-04 NOTE — Patient Instructions (Signed)
Medication Instructions:  Your physician has recommended you make the following change in your medication:    CHANGE Furosemide to 20mg  daily  *If you need a refill on your cardiac medications before your next appointment, please call your pharmacy*   Lab Work: Your physician recommends that you return for lab work on 05/19/21 for BMP, BNP, CBC  If you have labs (blood work) drawn today and your tests are completely normal, you will receive your results only by: [place] (if you have MyChart) OR A paper copy in the mail If you have any lab test that is abnormal or we need to change your treatment, we will call you to review the results.   Testing/Procedures: Your physician has requested that you have a TEE. During a TEE, sound waves are used to create images of your heart. It provides your doctor with information about the size and shape of your heart and how well your hearts chambers and valves are working. In this test, a transducer is attached to the end of a flexible tube thats guided down your throat and into your esophagus (the tube leading from you mouth to your stomach) to get a more detailed image of your heart. You are not awake for the procedure. Please see the instruction sheet given to you today. For further information please visit [email or web address].  Follow-Up: At [facility], you and your health needs are our priority.  As part of our continuing mission to provide you with exceptional heart care, we have created designated Provider Care Teams.  These Care Teams include your primary Cardiologist (physician) and Advanced Practice Providers (APPs -  Physician Assistants and Nurse Practitioners) who all work together to provide you with the care you need, when you need it.  We recommend signing up for the patient portal called "MyChart".  Sign up information is provided on this After Visit Summary.  MyChart is used to connect with patients for Virtual Visits  (Telemedicine).  Patients are able to view lab/test results, encounter notes, upcoming appointments, etc.  Non-urgent messages can be sent to your provider as well.   To learn more about what you can do with MyChart, go to [email or web address].    Your next appointment:   1 month(s)  The format for your next appointment:   In Person  Provider:   Fransico Him, MD or Advance Practice Provider    Other Instructions  You are scheduled for a TEE on 05/26/21 with Dr. Eleonore Chiquito.  Please arrive at the [facility] (Main Entrance A) at [facility]: [place], [place] [address] at 11 am. (1 hour prior to procedure unless lab work is needed)  DIET: Nothing to eat or drink after midnight except a sip of water with medications (see medication instructions below)  FYI: For your safety, and to allow [place] to monitor your vital signs accurately during the surgery/procedure we request that   if you have artificial nails, gel coating, SNS etc. Please have those removed prior to your surgery/procedure. Not having the nail coverings /polish removed may result in cancellation or delay of your surgery/procedure.   Medication Instructions: Hold Furosemide (Lasix) the morning of TEE Continue your anticoagulant: Xarelto  Labs: 05/19/21 for non fasting labs  You must have a responsible person to drive you home and stay in the waiting area during your procedure. Failure to do so could result in cancellation.  Bring your insurance cards.  *Special Note: Every effort is made to have  your procedure done on time. Occasionally there are emergencies that occur at the hospital that may cause delays. Please be patient if a delay does occur.    Transesophageal Echocardiogram Transesophageal echocardiogram (TEE) is a test that uses sound waves to take pictures of your heart. TEE is done by passing a small probe attached to a flexible tube down the part of the body that moves food from your mouth  to your stomach (esophagus). The pictures give clear images of your heart. This can help your doctor see if there are problems with your heart. Tell a doctor about: Any allergies you have. All medicines you are taking. This includes vitamins, herbs, eye drops, creams, and over-the-counter medicines. Any problems you or family members have had with anesthetic medicines. Any blood disorders you have. Any surgeries you have had. Any medical conditions you have. Any swallowing problems. Whether you have or have had a blockage in the part of the body that moves food from your mouth to your stomach. Whether you are pregnant or may be pregnant. What are the risks? In general, this is a safe procedure. But, problems may occur, such as: Damage to nearby structures or organs. A tear in the part of the body that moves food from your mouth to your stomach. Irregular heartbeat. Hoarse voice or trouble swallowing. Bleeding. What happens before the procedure? Medicines Ask your doctor about changing or stopping: Your normal medicines. Vitamins, herbs, and supplements. Over-the-counter medicines. Do not take aspirin or ibuprofen unless you are told to. General instructions Follow instructions from your doctor about what you cannot eat or drink. You will take out any dentures or dental retainers. Plan to have a responsible adult take you home from the hospital or clinic. Plan to have a responsible adult care for you for the time you are told after you leave the hospital or clinic. This is important. What happens during the procedure?  An IV will be put into one of your veins. You may be given: A sedative. This medicine helps you relax. A medicine to numb the back of your throat. This may be sprayed or gargled. Your blood pressure, heart rate, and breathing will be watched. You may be asked to lie on your left side. A bite block will be placed in your mouth. This keeps you from biting the  tube. The tip of the probe will be placed into the back of your mouth. You will be asked to swallow. Your doctor will take pictures of your heart. The probe and bite block will be taken out after the test is done. The procedure may vary among doctors and hospitals. What can I expect after the procedure? You will be monitored until you leave the hospital or clinic. This includes checking your blood pressure, heart rate, breathing rate, and blood oxygen level. Your throat may feel sore and numb. This will get better over time. You will not be allowed to eat or drink until the numbness has gone away. It is common to have a sore throat for a day or two. It is up to you to get the results of your procedure. Ask how to get your results when they are ready. Follow these instructions at home: If you were given a sedative during your procedure, do not drive or use machines until your doctor says that it is safe. Return to your normal activities when your doctor says that it is safe. Keep all follow-up visits. Summary TEE is a test that uses  sound waves to take pictures of your heart. You will be given a medicine to help you relax. Do not drive or use machines until your doctor says that it is safe. This information is not intended to replace advice given to you by your health care provider. Make sure you discuss any questions you have with your health care provider. Document Revised: 01/13/2021 Document Reviewed: 12/24/2019 Elsevier Patient Education  2022 [employer].
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
## 2021-05-05 ENCOUNTER — Encounter (HOSPITAL_BASED_OUTPATIENT_CLINIC_OR_DEPARTMENT_OTHER): Payer: Self-pay

## 2021-05-05 ENCOUNTER — Encounter (HOSPITAL_BASED_OUTPATIENT_CLINIC_OR_DEPARTMENT_OTHER): Payer: Self-pay | Admitting: Family

## 2021-05-14 ENCOUNTER — Encounter (HOSPITAL_COMMUNITY): Payer: Self-pay | Admitting: Cardiovascular Disease

## 2021-05-19 ENCOUNTER — Ambulatory Visit (HOSPITAL_BASED_OUTPATIENT_CLINIC_OR_DEPARTMENT_OTHER): Payer: PPO | Admitting: Family

## 2021-05-19 DIAGNOSIS — I340 Nonrheumatic mitral (valve) insufficiency: Secondary | ICD-10-CM | POA: Diagnosis not present

## 2021-05-20 LAB — CBC
Hematocrit: 31.4 % — ABNORMAL LOW (ref 37.5–51.0)
Hemoglobin: 10.9 g/dL — ABNORMAL LOW (ref 13.0–17.7)
MCH: 34.7 pg — ABNORMAL HIGH (ref 26.6–33.0)
MCHC: 34.7 g/dL (ref 31.5–35.7)
MCV: 100 fL — ABNORMAL HIGH (ref 79–97)
Platelets: 150 10*3/uL (ref 150–450)
RBC: 3.14 x10E6/uL — ABNORMAL LOW (ref 4.14–5.80)
RDW: 12 % (ref 11.6–15.4)
WBC: 5 10*3/uL (ref 3.4–10.8)

## 2021-05-20 LAB — BASIC METABOLIC PANEL
BUN/Creatinine Ratio: 16 (ref 10–24)
BUN: 15 mg/dL (ref 8–27)
CO2: 25 mmol/L (ref 20–29)
Calcium: 9.2 mg/dL (ref 8.6–10.2)
Chloride: 100 mmol/L (ref 96–106)
Creatinine, Ser: 0.95 mg/dL (ref 0.76–1.27)
Glucose: 93 mg/dL (ref 70–99)
Potassium: 4.4 mmol/L (ref 3.5–5.2)
Sodium: 138 mmol/L (ref 134–144)
eGFR: 83 mL/min/{1.73_m2} (ref >59)

## 2021-05-20 LAB — BRAIN NATRIURETIC PEPTIDE: BNP: 225.1 pg/mL — ABNORMAL HIGH (ref 0.0–100.0)

## 2021-05-21 ENCOUNTER — Telehealth: Payer: Self-pay | Admitting: Cardiology

## 2021-05-21 ENCOUNTER — Other Ambulatory Visit: Payer: Self-pay | Admitting: Cardiology

## 2021-05-21 DIAGNOSIS — I4821 Permanent atrial fibrillation: Secondary | ICD-10-CM

## 2021-05-21 NOTE — Telephone Encounter (Signed)
Pt updated with lab results along with NP's recommendations. Pt verbalized understanding.   Loel Dubonnet, NP  05/20/2021  3:43 PM EST     Normal kidney function and electrolytes. CBC shows slight reduction in hemoglobin from prior. Remains anemic. No evidence of infection. Proceed with TEE as scheduled. Continue to follow with primary care regarding anemia - this has been overall stable over the last few years.

## 2021-05-21 NOTE — Telephone Encounter (Signed)
Xarelto 20mg  refill request received. Pt is 76 years old, weight-78.4kg, Crea- 0.95 on 05/19/2021, last seen by Terie Purser on 05/04/2021, Diagnosis-Afib, CrCl-73.49ml/min; Dose is appropriate based on dosing criteria. Will send in refill to requested pharmacy.

## 2021-05-21 NOTE — Telephone Encounter (Signed)
Patient is returning call to discuss lab results. 

## 2021-05-26 ENCOUNTER — Other Ambulatory Visit: Payer: Self-pay

## 2021-05-26 ENCOUNTER — Ambulatory Visit (HOSPITAL_BASED_OUTPATIENT_CLINIC_OR_DEPARTMENT_OTHER)
Admission: RE | Admit: 2021-05-26 | Discharge: 2021-05-26 | Disposition: A | Discharge status: Home / Self Care | Payer: PPO | Source: Ambulatory Visit | Attending: Family | Admitting: Family

## 2021-05-26 ENCOUNTER — Ambulatory Visit (HOSPITAL_COMMUNITY): Payer: PPO | Admitting: Anesthesiology

## 2021-05-26 ENCOUNTER — Encounter (HOSPITAL_COMMUNITY): Payer: Self-pay | Admitting: Cardiovascular Disease

## 2021-05-26 ENCOUNTER — Ambulatory Visit (HOSPITAL_COMMUNITY)
Admission: RE | Admit: 2021-05-26 | Discharge: 2021-05-26 | Disposition: A | Discharge status: Home / Self Care | Payer: PPO | Attending: Cardiovascular Disease | Admitting: Cardiovascular Disease

## 2021-05-26 ENCOUNTER — Encounter (HOSPITAL_COMMUNITY)
Admission: RE | Disposition: A | Discharge status: Home / Self Care | Payer: Self-pay | Source: Home / Self Care | Attending: Cardiovascular Disease

## 2021-05-26 DIAGNOSIS — Z7901 Long term (current) use of anticoagulants: Secondary | ICD-10-CM | POA: Insufficient documentation

## 2021-05-26 DIAGNOSIS — I340 Nonrheumatic mitral (valve) insufficiency: Secondary | ICD-10-CM | POA: Diagnosis not present

## 2021-05-26 DIAGNOSIS — I2510 Atherosclerotic heart disease of native coronary artery without angina pectoris: Secondary | ICD-10-CM | POA: Insufficient documentation

## 2021-05-26 DIAGNOSIS — I4821 Permanent atrial fibrillation: Secondary | ICD-10-CM | POA: Insufficient documentation

## 2021-05-26 DIAGNOSIS — I700 Atherosclerosis of aorta: Secondary | ICD-10-CM | POA: Insufficient documentation

## 2021-05-26 DIAGNOSIS — I272 Pulmonary hypertension, unspecified: Secondary | ICD-10-CM | POA: Insufficient documentation

## 2021-05-26 DIAGNOSIS — I252 Old myocardial infarction: Secondary | ICD-10-CM | POA: Diagnosis not present

## 2021-05-26 DIAGNOSIS — E785 Hyperlipidemia, unspecified: Secondary | ICD-10-CM | POA: Insufficient documentation

## 2021-05-26 DIAGNOSIS — I081 Rheumatic disorders of both mitral and tricuspid valves: Secondary | ICD-10-CM | POA: Insufficient documentation

## 2021-05-26 DIAGNOSIS — I4891 Unspecified atrial fibrillation: Secondary | ICD-10-CM | POA: Diagnosis not present

## 2021-05-26 DIAGNOSIS — I088 Other rheumatic multiple valve diseases: Secondary | ICD-10-CM | POA: Diagnosis not present

## 2021-05-26 DIAGNOSIS — Z955 Presence of coronary angioplasty implant and graft: Secondary | ICD-10-CM | POA: Insufficient documentation

## 2021-05-26 HISTORY — PX: BUBBLE STUDY: SHX6837

## 2021-05-26 HISTORY — PX: TEE WITHOUT CARDIOVERSION: SHX5443

## 2021-05-26 LAB — ECHO TEE
MV M vel: 5.06 m/s
MV Peak grad: 102.4 mmHg
Radius: 0.6 cm

## 2021-05-26 SURGERY — ECHOCARDIOGRAM, TRANSESOPHAGEAL
Anesthesia: Monitor Anesthesia Care

## 2021-05-26 MED ORDER — BUTAMBEN-TETRACAINE-BENZOCAINE 2-2-14 % EX AERO
INHALATION_SPRAY | CUTANEOUS | Status: DC | PRN
  Administered 2021-05-26: 2 via TOPICAL

## 2021-05-26 MED ORDER — PROPOFOL 10 MG/ML IV BOLUS
INTRAVENOUS | Status: DC | PRN
  Administered 2021-05-26: 50 mg via INTRAVENOUS

## 2021-05-26 MED ORDER — GLYCOPYRROLATE 0.2 MG/ML IJ SOLN
INTRAMUSCULAR | Status: DC | PRN
  Administered 2021-05-26: .2 mg via INTRAVENOUS

## 2021-05-26 MED ORDER — LIDOCAINE 2% (20 MG/ML) 5 ML SYRINGE
INTRAMUSCULAR | Status: DC | PRN
  Administered 2021-05-26: 80 mg via INTRAVENOUS

## 2021-05-26 MED ORDER — SODIUM CHLORIDE 0.9 % IV SOLN: INTRAVENOUS | Status: DC | PRN

## 2021-05-26 MED ORDER — PROPOFOL 500 MG/50ML IV EMUL
INTRAVENOUS | Status: DC | PRN
  Administered 2021-05-26: 100 ug/kg/min via INTRAVENOUS

## 2021-05-26 MED ORDER — SODIUM CHLORIDE 0.9 % IV SOLN
INTRAVENOUS | Status: AC | PRN
  Administered 2021-05-26: 500 mL via INTRAVENOUS

## 2021-05-26 NOTE — Anesthesia Postprocedure Evaluation (Signed)
Anesthesia Post Note  Patient: Troy Willis  Procedure(s) Performed: TRANSESOPHAGEAL ECHOCARDIOGRAM (TEE) BUBBLE STUDY     Patient location during evaluation: PACU Anesthesia Type: MAC Level of consciousness: awake and alert Pain management: pain level controlled Vital Signs Assessment: post-procedure vital signs reviewed and stable Respiratory status: spontaneous breathing, nonlabored ventilation, respiratory function stable and patient connected to nasal cannula oxygen Cardiovascular status: stable and blood pressure returned to baseline Postop Assessment: no apparent nausea or vomiting Anesthetic complications: no   No notable events documented.  Last Vitals:  Vitals:   05/26/21 1246 05/26/21 1256  BP: 118/65 116/79  Pulse: 82 77  Resp: 17 18  Temp:    SpO2: 97% 100%    Last Pain:  Vitals:   05/26/21 1256  TempSrc:   PainSc: 0-No pain                 Tiajuana Amass

## 2021-05-26 NOTE — Anesthesia Procedure Notes (Signed)
Procedure Name: MAC Date/Time: 05/26/2021 12:00 PM Performed by: Lowella Dell, CRNA Pre-anesthesia Checklist: Patient identified, Emergency Drugs available, Suction available, Patient being monitored and Timeout performed Patient Re-evaluated:Patient Re-evaluated prior to induction Oxygen Delivery Method: Nasal cannula Placement Confirmation: positive ETCO2 Dental Injury: Teeth and Oropharynx as per pre-operative assessment

## 2021-05-26 NOTE — Discharge Instructions (Signed)
TEE  YOU HAD AN CARDIAC PROCEDURE TODAY: Refer to the procedure report and other information in the discharge instructions given to you for any specific questions about what was found during the examination. If this information does not answer your questions, please call [place] office at [phone] to clarify.   DIET: Your first meal following the procedure should be a light meal and then it is ok to progress to your normal diet. A half-sandwich or bowl of soup is an example of a good first meal. Heavy or fried foods are harder to digest and may make you feel nauseous or bloated. Drink plenty of fluids but you should avoid alcoholic beverages for 24 hours. If you had a esophageal dilation, please see attached instructions for diet.   ACTIVITY: Your care partner should take you home directly after the procedure. You should plan to take it easy, moving slowly for the rest of the day. You can resume normal activity the day after the procedure however YOU SHOULD NOT DRIVE, use power tools, machinery or perform tasks that involve climbing or major physical exertion for 24 hours (because of the sedation medicines used during the test).   SYMPTOMS TO REPORT IMMEDIATELY: A cardiologist can be reached at any hour.   Vomiting of blood or coffee ground material  New, significant abdominal pain  New, significant chest pain or pain under the shoulder blades  Painful or persistently difficult swallowing  New shortness of breath  Black, tarry-looking or red, bloody stools  FOLLOW UP:  Please also call with any specific questions about appointments or follow up tests.

## 2021-05-26 NOTE — Transfer of Care (Signed)
Immediate Anesthesia Transfer of Care Note  Patient: Troy Willis  Procedure(s) Performed: TRANSESOPHAGEAL ECHOCARDIOGRAM (TEE) BUBBLE STUDY  Patient Location: PACU and Endoscopy Unit  Anesthesia Type:MAC  Level of Consciousness: drowsy and patient cooperative  Airway & Oxygen Therapy: Patient Spontanous Breathing and Patient connected to nasal cannula oxygen  Post-op Assessment: Report given to RN and Post -op Vital signs reviewed and stable  Post vital signs: Reviewed and stable  Last Vitals:  Vitals Value Taken Time  BP 127/72 05/26/21 1236  Temp    Pulse 91 05/26/21 1238  Resp 18 05/26/21 1238  SpO2 97 % 05/26/21 1238  Vitals shown include unvalidated device data.  Last Pain:  Vitals:   05/26/21 1128  TempSrc: Oral  PainSc: 0-No pain         Complications: No notable events documented.

## 2021-05-26 NOTE — Progress Notes (Signed)
°  Echocardiogram Echocardiogram Transesophageal has been performed.  Troy Willis 05/26/2021, 1:41 PM

## 2021-05-26 NOTE — CV Procedure (Signed)
° ° °  TRANSESOPHAGEAL ECHOCARDIOGRAM   NAME:  Troy Willis    MRN: [id number] DOB:  03/12/1946    ADMIT DATE: 05/26/2021  INDICATIONS: Mitral regurgitation  PROCEDURE:   Informed consent was obtained prior to the procedure. The risks, benefits and alternatives for the procedure were discussed and the patient comprehended these risks.  Risks include, but are not limited to, cough, sore throat, vomiting, nausea, somnolence, esophageal and stomach trauma or perforation, bleeding, low blood pressure, aspiration, pneumonia, infection, trauma to the teeth and death.    Procedural time out performed. The oropharynx was anesthetized with topical 1% benzocaine.    Anesthesia was administered by Dr. Ola Spurr.  The patient was administered 200 mg of propofol and 80 mg of lidocaine to achieve and maintain moderate conscious sedation.  The patient's heart rate, blood pressure, and oxygen saturation are monitored continuously during the procedure. The period of conscious sedation is 19 minutes, of which I was present face-to-face 100% of this time.   The transesophageal probe was inserted in the esophagus and stomach without difficulty and multiple views were obtained.   COMPLICATIONS:    There were no immediate complications.  KEY FINDINGS:  Moderate MR Bileaflet prolapse.  Normal LV/RV function.  Full report to follow. Further management per primary team.   [place] Bells T. Audie Box, MD, [place]  [address], [place], [place] [address] [phone]  12:27 PM

## 2021-05-26 NOTE — Interval H&P Note (Signed)
History and Physical Interval Note:  05/26/2021 11:43 AM  Troy Willis  has presented today for surgery, with the diagnosis of MITRAL REGURGITATION.  The various methods of treatment have been discussed with the patient and family. After consideration of risks, benefits and other options for treatment, the patient has consented to  Procedure(s): TRANSESOPHAGEAL ECHOCARDIOGRAM (TEE) (N/A) as a surgical intervention.  The patient's history has been reviewed, patient examined, no change in status, stable for surgery.  I have reviewed the patient's chart and labs.  Questions were answered to the patient's satisfaction.    TEE for MR. NPO.   [place] Bells T. Audie Box, MD, [place]  [address] [place], [place] [address] [phone]  11:44 AM

## 2021-05-26 NOTE — Anesthesia Preprocedure Evaluation (Addendum)
Anesthesia Evaluation  Patient identified by MRN, date of birth, ID band Patient awake    Reviewed: Allergy & Precautions, NPO status , Patient's Chart, lab work & pertinent test results  Airway Mallampati: II  TM Distance: >3 FB Neck ROM: Full    Dental  (+) Dental Advisory Given   Pulmonary shortness of breath, COPD,    breath sounds clear to auscultation       Cardiovascular + CAD and + Past MI  + dysrhythmias Atrial Fibrillation + Valvular Problems/Murmurs (Severe MR, TR, RV dysfunction) MR  Rhythm:Regular Rate:Normal     Neuro/Psych CVA    GI/Hepatic Neg liver ROS, GERD  ,  Endo/Other  negative endocrine ROS  Renal/GU negative Renal ROS     Musculoskeletal  (+) Arthritis ,   Abdominal   Peds  Hematology  (+) anemia ,   Anesthesia Other Findings   Reproductive/Obstetrics                             Anesthesia Physical Anesthesia Plan  ASA: 3  Anesthesia Plan: MAC   Post-op Pain Management: Minimal or no pain anticipated   Induction:   PONV Risk Score and Plan: 1 and Propofol infusion and Treatment may vary due to age or medical condition  Airway Management Planned: Natural Airway and Nasal Cannula  Additional Equipment:   Intra-op Plan:   Post-operative Plan:   Informed Consent: I have reviewed the patients History and Physical, chart, labs and discussed the procedure including the risks, benefits and alternatives for the proposed anesthesia with the patient or authorized representative who has indicated his/her understanding and acceptance.   Patient has DNR.  Discussed DNR with patient and Suspend DNR.     Plan Discussed with: CRNA  Anesthesia Plan Comments:        Anesthesia Quick Evaluation

## 2021-05-27 ENCOUNTER — Encounter (HOSPITAL_BASED_OUTPATIENT_CLINIC_OR_DEPARTMENT_OTHER): Payer: Self-pay

## 2021-05-27 NOTE — Progress Notes (Signed)
Seen by patient Troy Willis on 05/27/2021 10:50 AM

## 2021-05-27 NOTE — Telephone Encounter (Signed)
Please advise 

## 2021-05-28 ENCOUNTER — Other Ambulatory Visit (HOSPITAL_BASED_OUTPATIENT_CLINIC_OR_DEPARTMENT_OTHER): Payer: Self-pay

## 2021-05-28 ENCOUNTER — Encounter (HOSPITAL_COMMUNITY): Payer: Self-pay | Admitting: Cardiovascular Disease

## 2021-05-28 DIAGNOSIS — H00024 Hordeolum internum left upper eyelid: Secondary | ICD-10-CM | POA: Diagnosis not present

## 2021-05-28 MED ORDER — NEOMYCIN-POLYMYXIN-DEXAMETH 3.5-10000-0.1 OP OINT
TOPICAL_OINTMENT | OPHTHALMIC | 0 refills | Status: DC
  Filled 2021-05-28: qty 3.5, 30d supply, fill #0

## 2021-06-01 ENCOUNTER — Other Ambulatory Visit (HOSPITAL_BASED_OUTPATIENT_CLINIC_OR_DEPARTMENT_OTHER): Payer: Self-pay

## 2021-06-02 DIAGNOSIS — N200 Calculus of kidney: Secondary | ICD-10-CM | POA: Diagnosis not present

## 2021-06-02 DIAGNOSIS — R82991 Hypocitraturia: Secondary | ICD-10-CM | POA: Diagnosis not present

## 2021-06-07 ENCOUNTER — Ambulatory Visit (HOSPITAL_BASED_OUTPATIENT_CLINIC_OR_DEPARTMENT_OTHER): Payer: PPO | Admitting: Family

## 2021-06-07 ENCOUNTER — Encounter (HOSPITAL_BASED_OUTPATIENT_CLINIC_OR_DEPARTMENT_OTHER): Payer: Self-pay | Admitting: Family

## 2021-06-07 ENCOUNTER — Other Ambulatory Visit: Payer: Self-pay

## 2021-06-07 ENCOUNTER — Encounter (HOSPITAL_BASED_OUTPATIENT_CLINIC_OR_DEPARTMENT_OTHER): Payer: Self-pay

## 2021-06-07 ENCOUNTER — Other Ambulatory Visit (HOSPITAL_BASED_OUTPATIENT_CLINIC_OR_DEPARTMENT_OTHER): Payer: Self-pay

## 2021-06-07 VITALS — BP 100/54 | HR 86 | Ht 75.0 in | Wt 165.0 lb

## 2021-06-07 DIAGNOSIS — I080 Rheumatic disorders of both mitral and aortic valves: Secondary | ICD-10-CM

## 2021-06-07 DIAGNOSIS — I25118 Atherosclerotic heart disease of native coronary artery with other forms of angina pectoris: Secondary | ICD-10-CM | POA: Diagnosis not present

## 2021-06-07 DIAGNOSIS — I340 Nonrheumatic mitral (valve) insufficiency: Secondary | ICD-10-CM

## 2021-06-07 DIAGNOSIS — I4821 Permanent atrial fibrillation: Secondary | ICD-10-CM | POA: Diagnosis not present

## 2021-06-07 DIAGNOSIS — E782 Mixed hyperlipidemia: Secondary | ICD-10-CM | POA: Diagnosis not present

## 2021-06-07 DIAGNOSIS — D6859 Other primary thrombophilia: Secondary | ICD-10-CM | POA: Diagnosis not present

## 2021-06-07 MED ORDER — NITROGLYCERIN 0.4 MG SL SUBL
0.4000 mg | SUBLINGUAL_TABLET | SUBLINGUAL | 3 refills | Status: AC | PRN
  Filled 2021-06-07: qty 25, 7d supply, fill #0
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
## 2021-06-07 NOTE — Patient Instructions (Addendum)
Medication Instructions:  Your physician has recommended you make the following change in your medication:    STOP Rosuvastatin (Crestor) for 4 weeks.   *If you need a refill on your cardiac medications before your next appointment, please call your pharmacy*   Lab Work: Your physician recommends that you return for lab work after your 4 week holiday labs February 27th for fasting lipid panel and CMET.  If you have labs (blood work) drawn today and your tests are completely normal, you will receive your results only by: [place] (if you have MyChart) OR A paper copy in the mail If you have any lab test that is abnormal or we need to change your treatment, we will call you to review the results.   Testing/Procedures: None ordered today.   Your transesophageal echocardiogram who showed your mitral valve was moderately leaky. We will consider repeating an echocardiogram in 6-12 months.    Follow-Up: At [facility], you and your health needs are our priority.  As part of our continuing mission to provide you with exceptional heart care, we have created designated Provider Care Teams.  These Care Teams include your primary Cardiologist (physician) and Advanced Practice Providers (APPs -  Physician Assistants and Nurse Practitioners) who all work together to provide you with the care you need, when you need it.  We recommend signing up for the patient portal called "MyChart".  Sign up information is provided on this After Visit Summary.  MyChart is used to connect with patients for Virtual Visits (Telemedicine).  Patients are able to view lab/test results, encounter notes, upcoming appointments, etc.  Non-urgent messages can be sent to your provider as well.   To learn more about what you can do with MyChart, go to [email or web address].    Your next appointment:   6 month(s)  The format for your next appointment:   In Person  Provider:   Fransico Him, MD or Advanced  Practice Provider    Other Instructions  Heart Healthy Diet Recommendations: A low-salt diet is recommended. Meats should be grilled, baked, or boiled. Avoid fried foods. Focus on lean protein sources like fish or chicken with vegetables and fruits. The American Heart Association is a Microbiologist!  American Heart Association Diet and Lifeystyle Recommendations    Exercise recommendations: The American Heart Association recommends 150 minutes of moderate intensity exercise weekly. Try 30 minutes of moderate intensity exercise 4-5 times per week. This could include walking, jogging, or swimming.

## 2021-06-08 ENCOUNTER — Ambulatory Visit (INDEPENDENT_AMBULATORY_CARE_PROVIDER_SITE_OTHER): Payer: PPO

## 2021-06-08 DIAGNOSIS — G4733 Obstructive sleep apnea (adult) (pediatric): Secondary | ICD-10-CM | POA: Diagnosis not present

## 2021-06-08 DIAGNOSIS — I2721 Secondary pulmonary arterial hypertension: Secondary | ICD-10-CM

## 2021-06-08 NOTE — Patient Instructions (Addendum)
It was good to see you again today, assuming all is well please see me in about 6 months  I ordered some labs for you to have done along with your lipids/ CMP later on this month  We will check on your blood counts and also your iron and B12, folate levels   Let mew know if any concerns!
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
## 2021-06-09 DIAGNOSIS — G4733 Obstructive sleep apnea (adult) (pediatric): Secondary | ICD-10-CM | POA: Diagnosis not present

## 2021-06-10 DIAGNOSIS — L853 Xerosis cutis: Secondary | ICD-10-CM | POA: Diagnosis not present

## 2021-06-10 DIAGNOSIS — L218 Other seborrheic dermatitis: Secondary | ICD-10-CM | POA: Diagnosis not present

## 2021-06-10 DIAGNOSIS — Z85828 Personal history of other malignant neoplasm of skin: Secondary | ICD-10-CM | POA: Diagnosis not present

## 2021-06-10 DIAGNOSIS — D1801 Hemangioma of skin and subcutaneous tissue: Secondary | ICD-10-CM | POA: Diagnosis not present

## 2021-06-10 DIAGNOSIS — L700 Acne vulgaris: Secondary | ICD-10-CM | POA: Diagnosis not present

## 2021-06-10 DIAGNOSIS — L821 Other seborrheic keratosis: Secondary | ICD-10-CM | POA: Diagnosis not present

## 2021-06-14 ENCOUNTER — Ambulatory Visit (INDEPENDENT_AMBULATORY_CARE_PROVIDER_SITE_OTHER): Payer: PPO | Admitting: Family Medicine

## 2021-06-14 VITALS — BP 128/72 | HR 62 | Temp 97.8°F | Resp 18 | Ht 75.0 in | Wt 167.0 lb

## 2021-06-14 DIAGNOSIS — J849 Interstitial pulmonary disease, unspecified: Secondary | ICD-10-CM

## 2021-06-14 DIAGNOSIS — I4821 Permanent atrial fibrillation: Secondary | ICD-10-CM | POA: Diagnosis not present

## 2021-06-14 DIAGNOSIS — D649 Anemia, unspecified: Secondary | ICD-10-CM | POA: Diagnosis not present

## 2021-06-16 ENCOUNTER — Telehealth (INDEPENDENT_AMBULATORY_CARE_PROVIDER_SITE_OTHER): Payer: PPO | Admitting: Family Medicine

## 2021-06-16 ENCOUNTER — Encounter: Payer: Self-pay | Admitting: Family Medicine

## 2021-06-16 ENCOUNTER — Other Ambulatory Visit (HOSPITAL_BASED_OUTPATIENT_CLINIC_OR_DEPARTMENT_OTHER): Payer: Self-pay

## 2021-06-16 VITALS — Temp 102.0°F

## 2021-06-16 DIAGNOSIS — U071 COVID-19: Secondary | ICD-10-CM | POA: Diagnosis not present

## 2021-06-16 MED ORDER — MOLNUPIRAVIR EUA 200MG CAPSULE
4.0000 | ORAL_CAPSULE | Freq: Two times a day (BID) | ORAL | 0 refills | Status: AC
  Filled 2021-06-16: qty 40, 5d supply, fill #0

## 2021-06-16 NOTE — Telephone Encounter (Signed)
Mychart message sent by pt: Troy Castle "[name]"  P [facility] (supporting [name], NP) 23 minutes ago (10:32 AM)   Started coughing Monday night, coughed all day Tuesday, felt bad Tuesday night, but no temperature.  Felt weak this morning, temperature of 102F, did Covid test and tested hot pink.   Contacted PCP (Dr. Lillie Fragmin office) and spoke with Caleen Jobs.  They are going to send in a prescription for Molnuprivar.  I told them we'd used Paxlovid in a previous episode but she said she couldn't do that because I'm taking Xralto. Hope all is going well for you! [name]      Routing to [name] as an Pharmacist, hospital.

## 2021-06-16 NOTE — Progress Notes (Signed)
Virtual Video Visit via MyChart Note  I connected with  Troy Willis on 06/16/21 at 10:00 AM EST by the video enabled telemedicine application for MyChart, and verified that I am speaking with the correct person using two identifiers.   I introduced myself as a Designer, jewellery with the practice. We discussed the limitations of evaluation and management by telemedicine and the availability of in person appointments. The patient expressed understanding and agreed to proceed.  Participating parties in this visit include: The patient and the nurse practitioner listed.  The patient is: At home I am: In the office - [facility] [place]:    CC:  Chief Complaint  Patient presents with   Cough   fever    HPI: Troy Willis is a 76 y.o. year old male presenting today via MyChart today for COVID+.  Patient states he started feeling poorly on Monday. He has had a nonproductive cough, body aches, chills, fatigue, mild chest soreness from coughing, fever max of 102F. He tested positive for COVID today. So far he has only taken Tylenol. He would like antiviral therapy. He denies any chest pain, dyspnea, wheezing, nausea, vomiting, diarrhea. Reports history of bronchiectasis.    Home vitals: 131/71, HR 77, O2 97%, temp 102    Past medical history, Surgical history, Family history not pertinant except as noted below, Social history, Allergies, and medications have been entered into the medical record, reviewed, and corrections made.   Review of Systems:  All review of systems negative except what is listed in the HPI   Objective:    General:  Speaking clearly in complete sentences. Absent shortness of breath noted.   Alert and oriented x3.   Normal judgment.  Absent acute distress.   Impression and Recommendations:    1. COVID-19 - molnupiravir EUA (LAGEVRIO) 200 mg CAPS capsule; Take 4 capsules (800 mg total) by mouth 2 (two)  times daily for 5 days.  Dispense: 40 capsule; Refill: 0  Patient takes Xarelto daily, so we will use molnupiravir instead of Paxlovid. Information attached to AVS regarding medication, OTC meds, and quarantine guidelines. Continue supportive measures including rest, hydration, humidifier use, steam showers, warm compresses to sinuses, warm liquids with lemon and honey, and over-the-counter cough, cold, and analgesics as needed.  Patient aware of signs/symptoms requiring further/urgent evaluation.    Follow-up if symptoms worsen or fail to improve.    I discussed the assessment and treatment plan with the patient. The patient was provided an opportunity to ask questions and all were answered. The patient agreed with the plan and demonstrated an understanding of the instructions.   The patient was advised to call back or seek an in-person evaluation if the symptoms worsen or if the condition fails to improve as anticipated.  I spent 20 minutes dedicated to the care of this patient on the date of this encounter to include pre-visit chart review of prior notes and results, face-to-face time with the patient, and post-visit ordering of testing as indicated.   Terrilyn Saver, NP

## 2021-06-21 NOTE — Telephone Encounter (Signed)
Pt's PCP sent antiviral to pharmacy for him.

## 2021-06-24 DIAGNOSIS — H524 Presbyopia: Secondary | ICD-10-CM | POA: Diagnosis not present

## 2021-06-24 DIAGNOSIS — H26492 Other secondary cataract, left eye: Secondary | ICD-10-CM | POA: Diagnosis not present

## 2021-06-24 DIAGNOSIS — Z961 Presence of intraocular lens: Secondary | ICD-10-CM | POA: Diagnosis not present

## 2021-06-24 DIAGNOSIS — H0102A Squamous blepharitis right eye, upper and lower eyelids: Secondary | ICD-10-CM | POA: Diagnosis not present

## 2021-06-24 DIAGNOSIS — H40013 Open angle with borderline findings, low risk, bilateral: Secondary | ICD-10-CM | POA: Diagnosis not present

## 2021-07-07 ENCOUNTER — Encounter (HOSPITAL_BASED_OUTPATIENT_CLINIC_OR_DEPARTMENT_OTHER): Payer: Self-pay

## 2021-07-07 NOTE — Telephone Encounter (Signed)
Follow up.

## 2021-07-12 ENCOUNTER — Other Ambulatory Visit: Payer: PPO | Admitting: *Deleted

## 2021-07-12 ENCOUNTER — Other Ambulatory Visit: Payer: Self-pay

## 2021-07-12 DIAGNOSIS — I25118 Atherosclerotic heart disease of native coronary artery with other forms of angina pectoris: Secondary | ICD-10-CM | POA: Diagnosis not present

## 2021-07-12 DIAGNOSIS — E782 Mixed hyperlipidemia: Secondary | ICD-10-CM | POA: Diagnosis not present

## 2021-07-12 LAB — COMPREHENSIVE METABOLIC PANEL
ALT: 21 IU/L (ref 0–44)
AST: 27 IU/L (ref 0–40)
Albumin/Globulin Ratio: 1.3 (ref 1.2–2.2)
Albumin: 4.3 g/dL (ref 3.7–4.7)
Alkaline Phosphatase: 72 IU/L (ref 44–121)
BUN/Creatinine Ratio: 19 (ref 10–24)
BUN: 19 mg/dL (ref 8–27)
Bilirubin Total: 0.5 mg/dL (ref 0.0–1.2)
CO2: 26 mmol/L (ref 20–29)
Calcium: 9.2 mg/dL (ref 8.6–10.2)
Chloride: 102 mmol/L (ref 96–106)
Creatinine, Ser: 0.99 mg/dL (ref 0.76–1.27)
Globulin, Total: 3.4 g/dL (ref 1.5–4.5)
Glucose: 94 mg/dL (ref 70–99)
Potassium: 4.2 mmol/L (ref 3.5–5.2)
Sodium: 139 mmol/L (ref 134–144)
Total Protein: 7.7 g/dL (ref 6.0–8.5)
eGFR: 79 mL/min/{1.73_m2} (ref >59)

## 2021-07-12 LAB — LIPID PANEL
Chol/HDL Ratio: 2.6 ratio (ref 0.0–5.0)
Cholesterol, Total: 171 mg/dL (ref 100–199)
HDL: 65 mg/dL (ref >39)
LDL Chol Calc (NIH): 95 mg/dL (ref 0–99)
Triglycerides: 55 mg/dL (ref 0–149)
VLDL Cholesterol Cal: 11 mg/dL (ref 5–40)

## 2021-07-12 NOTE — Telephone Encounter (Signed)
Please advise 

## 2021-07-13 ENCOUNTER — Other Ambulatory Visit (HOSPITAL_BASED_OUTPATIENT_CLINIC_OR_DEPARTMENT_OTHER): Payer: Self-pay

## 2021-07-13 ENCOUNTER — Telehealth (HOSPITAL_BASED_OUTPATIENT_CLINIC_OR_DEPARTMENT_OTHER): Payer: Self-pay

## 2021-07-13 DIAGNOSIS — E782 Mixed hyperlipidemia: Secondary | ICD-10-CM

## 2021-07-13 MED ORDER — PRAVASTATIN SODIUM 20 MG PO TABS
20.0000 mg | ORAL_TABLET | ORAL | 4 refills | Status: DC
  Filled 2021-07-13: qty 30, 70d supply, fill #0
  Filled 2021-09-02 – 2021-09-06 (×2): qty 30, 70d supply, fill #1
  Filled 2021-10-29: qty 30, 70d supply, fill #2
  Filled 2022-02-03: qty 30, 70d supply, fill #3
  Filled 2022-04-06: qty 30, 70d supply, fill #4

## 2021-07-13 NOTE — Telephone Encounter (Addendum)
Results called to patient who verbalizes understanding!   Prescription sent to preferred pharmacy and lab slips ordered    ----- Message from Loel Dubonnet, NP sent at 07/13/2021  7:50 AM EST ----- Normal kidneys, liver, electrolytes. LDL of 95 which is above goal of less than 70. Previously intolerant of Rosuvastatin (Crestor), Atorvastatin (Lipitor) with leg cramps. Recommend trial of Pravastatin 20mg  three times per week with repeat lipid panel, LFT in 2 months. This is a lower potency statin that is often better tolerated. Have him contact us if he notes any recurrent leg cramps.

## 2021-07-30 ENCOUNTER — Other Ambulatory Visit (HOSPITAL_BASED_OUTPATIENT_CLINIC_OR_DEPARTMENT_OTHER): Payer: Self-pay

## 2021-09-02 ENCOUNTER — Other Ambulatory Visit (HOSPITAL_BASED_OUTPATIENT_CLINIC_OR_DEPARTMENT_OTHER): Payer: Self-pay

## 2021-09-06 ENCOUNTER — Other Ambulatory Visit (HOSPITAL_BASED_OUTPATIENT_CLINIC_OR_DEPARTMENT_OTHER): Payer: Self-pay

## 2021-09-09 ENCOUNTER — Encounter (HOSPITAL_BASED_OUTPATIENT_CLINIC_OR_DEPARTMENT_OTHER): Payer: Self-pay

## 2021-09-10 DIAGNOSIS — E782 Mixed hyperlipidemia: Secondary | ICD-10-CM | POA: Diagnosis not present

## 2021-09-10 LAB — LIPID PANEL
Chol/HDL Ratio: 2.2 ratio (ref 0.0–5.0)
Cholesterol, Total: 137 mg/dL (ref 100–199)
HDL: 63 mg/dL (ref >39)
LDL Chol Calc (NIH): 64 mg/dL (ref 0–99)
Triglycerides: 39 mg/dL (ref 0–149)
VLDL Cholesterol Cal: 10 mg/dL (ref 5–40)

## 2021-09-13 ENCOUNTER — Telehealth (HOSPITAL_BASED_OUTPATIENT_CLINIC_OR_DEPARTMENT_OTHER): Payer: Self-pay

## 2021-09-13 NOTE — Telephone Encounter (Addendum)
Results called to patient who verbalizes understanding!  ? ? ? ?----- Message from Loel Dubonnet, NP sent at 09/12/2021  1:39 PM EDT ----- ?Lipid panel looks fantastic. All numbers at goal! Continue current meds. ?

## 2021-09-16 ENCOUNTER — Encounter: Payer: Self-pay | Admitting: Family Medicine
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
## 2021-09-17 NOTE — Patient Instructions (Addendum)
It was great to see you again today, as soon as your QuantiFERON result comes and I can send everything to [facility] for you ?

## 2021-09-20 ENCOUNTER — Ambulatory Visit (INDEPENDENT_AMBULATORY_CARE_PROVIDER_SITE_OTHER): Payer: PPO | Admitting: Family Medicine

## 2021-09-20 VITALS — BP 112/60 | HR 55 | Temp 97.6°F | Resp 18 | Ht 75.0 in | Wt 172.4 lb

## 2021-09-20 DIAGNOSIS — Z125 Encounter for screening for malignant neoplasm of prostate: Secondary | ICD-10-CM | POA: Diagnosis not present

## 2021-09-20 DIAGNOSIS — Z111 Encounter for screening for respiratory tuberculosis: Secondary | ICD-10-CM | POA: Diagnosis not present

## 2021-09-20 DIAGNOSIS — D649 Anemia, unspecified: Secondary | ICD-10-CM | POA: Diagnosis not present

## 2021-09-21 ENCOUNTER — Encounter: Payer: Self-pay | Admitting: Family Medicine

## 2021-09-21 DIAGNOSIS — D649 Anemia, unspecified: Secondary | ICD-10-CM

## 2021-09-21 LAB — CBC
HCT: 33.2 % — ABNORMAL LOW (ref 39.0–52.0)
Hemoglobin: 11.1 g/dL — ABNORMAL LOW (ref 13.0–17.0)
MCHC: 33.6 g/dL (ref 30.0–36.0)
MCV: 105.4 fl — ABNORMAL HIGH (ref 78.0–100.0)
Platelets: 159 10*3/uL (ref 150.0–400.0)
RBC: 3.15 Mil/uL — ABNORMAL LOW (ref 4.22–5.81)
RDW: 13.8 % (ref 11.5–15.5)
WBC: 5.3 10*3/uL (ref 4.0–10.5)

## 2021-09-21 LAB — FERRITIN: Ferritin: 52.9 ng/mL (ref 22.0–322.0)

## 2021-09-21 LAB — PSA: PSA: 0.23 ng/mL (ref 0.10–4.00)

## 2021-09-21 LAB — FOLATE: Folate: 11.7 ng/mL (ref >5.9)

## 2021-09-21 LAB — VITAMIN B12: Vitamin B-12: 328 pg/mL (ref 211–911)

## 2021-09-22 LAB — QUANTIFERON-TB GOLD PLUS
Mitogen-NIL: 9.34 IU/mL
NIL: 0.02 IU/mL
QuantiFERON-TB Gold Plus: NEGATIVE
TB1-NIL: 0 IU/mL
TB2-NIL: 0.01 IU/mL

## 2021-09-23 ENCOUNTER — Encounter: Payer: Self-pay | Admitting: Family Medicine

## 2021-09-27 ENCOUNTER — Other Ambulatory Visit: Payer: Self-pay | Admitting: Family

## 2021-09-27 DIAGNOSIS — D649 Anemia, unspecified: Secondary | ICD-10-CM

## 2021-09-28 ENCOUNTER — Encounter: Payer: Self-pay | Admitting: Family

## 2021-09-28 ENCOUNTER — Inpatient Hospital Stay: Payer: PPO | Admitting: Family

## 2021-09-28 ENCOUNTER — Inpatient Hospital Stay: Payer: PPO | Attending: Family

## 2021-09-28 VITALS — BP 110/61 | HR 56 | Temp 97.6°F | Resp 18 | Ht 75.0 in | Wt 172.8 lb

## 2021-09-28 DIAGNOSIS — Z85828 Personal history of other malignant neoplasm of skin: Secondary | ICD-10-CM

## 2021-09-28 DIAGNOSIS — Z800 Family history of malignant neoplasm of digestive organs: Secondary | ICD-10-CM | POA: Insufficient documentation

## 2021-09-28 DIAGNOSIS — D649 Anemia, unspecified: Secondary | ICD-10-CM | POA: Insufficient documentation

## 2021-09-28 DIAGNOSIS — Z808 Family history of malignant neoplasm of other organs or systems: Secondary | ICD-10-CM | POA: Diagnosis not present

## 2021-09-28 LAB — CBC WITH DIFFERENTIAL (CANCER CENTER ONLY)
Abs Immature Granulocytes: 0.01 10*3/uL (ref 0.00–0.07)
Basophils Absolute: 0.1 10*3/uL (ref 0.0–0.1)
Basophils Relative: 2 %
Eosinophils Absolute: 0.2 10*3/uL (ref 0.0–0.5)
Eosinophils Relative: 3 %
HCT: 35.5 % — ABNORMAL LOW (ref 39.0–52.0)
Hemoglobin: 11.9 g/dL — ABNORMAL LOW (ref 13.0–17.0)
Immature Granulocytes: 0 %
Lymphocytes Relative: 28 %
Lymphs Abs: 1.5 10*3/uL (ref 0.7–4.0)
MCH: 35.2 pg — ABNORMAL HIGH (ref 26.0–34.0)
MCHC: 33.5 g/dL (ref 30.0–36.0)
MCV: 105 fL — ABNORMAL HIGH (ref 80.0–100.0)
Monocytes Absolute: 0.5 10*3/uL (ref 0.1–1.0)
Monocytes Relative: 10 %
Neutro Abs: 3 10*3/uL (ref 1.7–7.7)
Neutrophils Relative %: 57 %
Platelet Count: 174 10*3/uL (ref 150–400)
RBC: 3.38 MIL/uL — ABNORMAL LOW (ref 4.22–5.81)
RDW: 13.3 % (ref 11.5–15.5)
WBC Count: 5.2 10*3/uL (ref 4.0–10.5)
nRBC: 0 % (ref 0.0–0.2)

## 2021-09-28 LAB — IRON AND IRON BINDING CAPACITY (CC-WL,HP ONLY)
Iron: 187 ug/dL — ABNORMAL HIGH (ref 45–182)
Saturation Ratios: 58 % — ABNORMAL HIGH (ref 17.9–39.5)
TIBC: 325 ug/dL (ref 250–450)
UIBC: 138 ug/dL (ref 117–376)

## 2021-09-28 LAB — RETICULOCYTES
Immature Retic Fract: 13.1 % (ref 2.3–15.9)
RBC.: 3.38 MIL/uL — ABNORMAL LOW (ref 4.22–5.81)
Retic Count, Absolute: 43.9 10*3/uL (ref 19.0–186.0)
Retic Ct Pct: 1.3 % (ref 0.4–3.1)

## 2021-09-28 LAB — CMP (CANCER CENTER ONLY)
ALT: 14 U/L (ref 0–44)
AST: 24 U/L (ref 15–41)
Albumin: 4.1 g/dL (ref 3.5–5.0)
Alkaline Phosphatase: 64 U/L (ref 38–126)
Anion gap: 5 (ref 5–15)
BUN: 15 mg/dL (ref 8–23)
CO2: 30 mmol/L (ref 22–32)
Calcium: 9.8 mg/dL (ref 8.9–10.3)
Chloride: 103 mmol/L (ref 98–111)
Creatinine: 0.94 mg/dL (ref 0.61–1.24)
GFR, Estimated: >60 mL/min (ref >60)
Glucose, Bld: 86 mg/dL (ref 70–99)
Potassium: 4 mmol/L (ref 3.5–5.1)
Sodium: 138 mmol/L (ref 135–145)
Total Bilirubin: 0.9 mg/dL (ref 0.3–1.2)
Total Protein: 7.7 g/dL (ref 6.5–8.1)

## 2021-09-28 LAB — SAVE SMEAR(SSMR), FOR PROVIDER SLIDE REVIEW

## 2021-09-28 LAB — LACTATE DEHYDROGENASE: LDH: 190 U/L (ref 98–192)

## 2021-09-28 LAB — FERRITIN: Ferritin: 53 ng/mL (ref 24–336)
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
## 2021-09-29 LAB — ERYTHROPOIETIN: Erythropoietin: 30.6 m[IU]/mL — ABNORMAL HIGH (ref 2.6–18.5)

## 2021-10-21 DIAGNOSIS — G4733 Obstructive sleep apnea (adult) (pediatric): Secondary | ICD-10-CM

## 2021-10-25 ENCOUNTER — Telehealth: Payer: Self-pay | Admitting: Acute Care

## 2021-10-25 NOTE — Telephone Encounter (Signed)
Called patient but he did not answer. Left message for him to call us back.  

## 2021-10-27 NOTE — Telephone Encounter (Signed)
Please refer to mychart encounter from 10/21/21 as pt was made aware of sleep study results in that encounter.

## 2021-10-29 ENCOUNTER — Other Ambulatory Visit (HOSPITAL_BASED_OUTPATIENT_CLINIC_OR_DEPARTMENT_OTHER): Payer: Self-pay

## 2021-10-29 ENCOUNTER — Other Ambulatory Visit: Payer: Self-pay | Admitting: Cardiology

## 2021-11-01 ENCOUNTER — Other Ambulatory Visit (HOSPITAL_BASED_OUTPATIENT_CLINIC_OR_DEPARTMENT_OTHER): Payer: Self-pay

## 2021-11-01 MED ORDER — FUROSEMIDE 20 MG PO TABS
20.0000 mg | ORAL_TABLET | Freq: Every day | ORAL | 2 refills | Status: DC | PRN
  Filled 2021-11-01: qty 90, 90d supply, fill #0

## 2021-11-08 ENCOUNTER — Other Ambulatory Visit (HOSPITAL_BASED_OUTPATIENT_CLINIC_OR_DEPARTMENT_OTHER): Payer: Self-pay

## 2021-11-12 ENCOUNTER — Other Ambulatory Visit: Payer: Self-pay | Admitting: Cardiology

## 2021-11-12 DIAGNOSIS — I4821 Permanent atrial fibrillation: Secondary | ICD-10-CM

## 2021-11-12 NOTE — Telephone Encounter (Signed)
Xarelto '20mg'$  refill request received. Pt is 76 years old, weight-78.4kg, Crea- 0.94 on 09/28/2021, last seen by Terie Purser on 06/07/2021, Diagnosis-Afib, CrCl-72.11m/min; Dose is appropriate based on dosing criteria. Will send in refill to requested pharmacy.

## 2021-11-17 DIAGNOSIS — G4733 Obstructive sleep apnea (adult) (pediatric): Secondary | ICD-10-CM | POA: Diagnosis not present

## 2021-11-25 ENCOUNTER — Telehealth: Payer: Self-pay | Admitting: Acute Care

## 2021-11-25 NOTE — Telephone Encounter (Signed)
Can you please call the patient for a follow up complience CPAP check between 12/23/2021 and 01/18/2022 with Eric Form?

## 2021-11-29 ENCOUNTER — Ambulatory Visit (INDEPENDENT_AMBULATORY_CARE_PROVIDER_SITE_OTHER): Payer: PPO

## 2021-11-29 ENCOUNTER — Telehealth (HOSPITAL_BASED_OUTPATIENT_CLINIC_OR_DEPARTMENT_OTHER): Payer: Self-pay

## 2021-11-29 DIAGNOSIS — I080 Rheumatic disorders of both mitral and aortic valves: Secondary | ICD-10-CM

## 2021-11-29 LAB — ECHOCARDIOGRAM COMPLETE
AR max vel: 2.75 cm2
AV Area VTI: 3.09 cm2
AV Area mean vel: 2.66 cm2
AV Mean grad: 2 mmHg
AV Peak grad: 3.4 mmHg
Ao pk vel: 0.92 m/s
MV M vel: 5.78 m/s
MV Peak grad: 133.6 mmHg
Radius: 0.7 cm
S' Lateral: 2.55 cm

## 2021-11-29 NOTE — Telephone Encounter (Addendum)
Results called to patient who verbalizes understanding! Patient scheduled for 8/8 8am with Laurann Montana, NP      ----- Message from Loel Dubonnet, NP sent at 11/29/2021 10:17 AM EDT ----- Echocardiogram with normal heart muscle function.  Left atrium moderately dilated, right atrium severely dilated.  Moderate leaking of mitral and tricuspid valve.  Overall similar compared to previous.  Due for 6 mos f/u with Dr. Radford Pax or APP. Please assist to schedule.

## 2021-11-30 ENCOUNTER — Other Ambulatory Visit (HOSPITAL_BASED_OUTPATIENT_CLINIC_OR_DEPARTMENT_OTHER): Payer: Self-pay

## 2021-12-09 ENCOUNTER — Telehealth: Payer: Self-pay | Admitting: Family Medicine

## 2021-12-09 NOTE — Telephone Encounter (Signed)
Called to check on patient.  His wife [name] is quite ill with what looks like serious Hodgkin's lymphoma.  Patient's daughters have confided they are worried about him, he is under a lot of stress and seems to be having persistentdiarrhea I asked [name] how he is doing-he did not mention any issues with diarrhea, but admits he is feeling very tired.  I encouraged him to rest to keep his strength.  We will plan to see each other on Monday where she will hope to make, assuming he is not urgently needed to be with his wife
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
## 2021-12-13 ENCOUNTER — Ambulatory Visit: Payer: PPO | Admitting: Family Medicine

## 2021-12-18 DIAGNOSIS — G4733 Obstructive sleep apnea (adult) (pediatric): Secondary | ICD-10-CM | POA: Diagnosis not present

## 2021-12-21 ENCOUNTER — Ambulatory Visit (HOSPITAL_BASED_OUTPATIENT_CLINIC_OR_DEPARTMENT_OTHER): Payer: PPO | Admitting: Family

## 2021-12-21 ENCOUNTER — Encounter (HOSPITAL_BASED_OUTPATIENT_CLINIC_OR_DEPARTMENT_OTHER): Payer: Self-pay | Admitting: Family

## 2021-12-21 ENCOUNTER — Other Ambulatory Visit (HOSPITAL_BASED_OUTPATIENT_CLINIC_OR_DEPARTMENT_OTHER): Payer: Self-pay

## 2021-12-21 VITALS — BP 118/72 | HR 80 | Ht 75.0 in | Wt 164.1 lb

## 2021-12-21 DIAGNOSIS — D6859 Other primary thrombophilia: Secondary | ICD-10-CM | POA: Diagnosis not present

## 2021-12-21 DIAGNOSIS — E785 Hyperlipidemia, unspecified: Secondary | ICD-10-CM

## 2021-12-21 DIAGNOSIS — I4821 Permanent atrial fibrillation: Secondary | ICD-10-CM | POA: Diagnosis not present

## 2021-12-21 DIAGNOSIS — I25118 Atherosclerotic heart disease of native coronary artery with other forms of angina pectoris: Secondary | ICD-10-CM

## 2021-12-21 DIAGNOSIS — I080 Rheumatic disorders of both mitral and aortic valves: Secondary | ICD-10-CM | POA: Diagnosis not present

## 2021-12-21 MED ORDER — FUROSEMIDE 20 MG PO TABS
20.0000 mg | ORAL_TABLET | Freq: Every day | ORAL | 3 refills | Status: DC
  Filled 2021-12-21 – 2022-02-03 (×2): qty 90, 90d supply, fill #0
  Filled 2022-05-04: qty 90, 90d supply, fill #1
  Filled 2022-06-23 – 2022-08-03 (×2): qty 90, 90d supply, fill #2
  Filled 2022-12-08: qty 90, 90d supply, fill #3
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
## 2021-12-21 NOTE — Patient Instructions (Addendum)
Medication Instructions:  Your physician has recommended you make the following change in your medication:   Hold: Pravastatin for 2 weeks to see if stomach issues resolve.   *If you need a refill on your cardiac medications before your next appointment, please call your pharmacy*  Follow-Up: At [facility], you and your health needs are our priority.  As part of our continuing mission to provide you with exceptional heart care, we have created designated Provider Care Teams.  These Care Teams include your primary Cardiologist (physician) and Advanced Practice Providers (APPs -  Physician Assistants and Nurse Practitioners) who all work together to provide you with the care you need, when you need it.  We recommend signing up for the patient portal called "MyChart".  Sign up information is provided on this After Visit Summary.  MyChart is used to connect with patients for Virtual Visits (Telemedicine).  Patients are able to view lab/test results, encounter notes, upcoming appointments, etc.  Non-urgent messages can be sent to your provider as well.   To learn more about what you can do with MyChart, go to [email or web address].    Your next appointment:   6 months or 1 year pending Caitlin's discussion with Dr. Radford Pax  Other Instructions Help with affording Xarelto:  If you are paying more than $85 for a 30-day supply or $240 for a 90-day supply of Xarelto, you may qualify for a program called Engineer, maintenance.   Through this program the same Xarelto will be delivered to your door by [place] at a reduced price. There is no membership fee and you do not have to share income information. Register beginning April 1st and get refills until December 31st.   To register for the program, you can visit [email or web address] or call 888-[email or web address] 636-737-3466).  Important Information About Sugar

## 2021-12-27 ENCOUNTER — Encounter (HOSPITAL_BASED_OUTPATIENT_CLINIC_OR_DEPARTMENT_OTHER): Payer: Self-pay

## 2021-12-27 ENCOUNTER — Other Ambulatory Visit: Payer: Self-pay | Admitting: Family

## 2021-12-27 DIAGNOSIS — D649 Anemia, unspecified: Secondary | ICD-10-CM

## 2021-12-27 NOTE — Addendum Note (Signed)
Addended by: Loel Dubonnet on: 12/27/2021 04:47 PM   Modules accepted: Orders

## 2021-12-28 ENCOUNTER — Inpatient Hospital Stay: Payer: PPO | Attending: Family

## 2021-12-28 ENCOUNTER — Inpatient Hospital Stay: Payer: PPO | Admitting: Family

## 2021-12-28 ENCOUNTER — Telehealth: Payer: Self-pay | Admitting: *Deleted

## 2021-12-28 ENCOUNTER — Encounter: Payer: Self-pay | Admitting: Family

## 2021-12-28 VITALS — BP 121/70 | HR 51 | Temp 98.2°F | Resp 18 | Wt 163.0 lb

## 2021-12-28 DIAGNOSIS — D539 Nutritional anemia, unspecified: Secondary | ICD-10-CM | POA: Insufficient documentation

## 2021-12-28 DIAGNOSIS — R202 Paresthesia of skin: Secondary | ICD-10-CM | POA: Diagnosis not present

## 2021-12-28 DIAGNOSIS — K5900 Constipation, unspecified: Secondary | ICD-10-CM | POA: Diagnosis not present

## 2021-12-28 DIAGNOSIS — E538 Deficiency of other specified B group vitamins: Secondary | ICD-10-CM | POA: Diagnosis not present

## 2021-12-28 DIAGNOSIS — R200 Anesthesia of skin: Secondary | ICD-10-CM | POA: Insufficient documentation

## 2021-12-28 DIAGNOSIS — D649 Anemia, unspecified: Secondary | ICD-10-CM

## 2021-12-28 DIAGNOSIS — G4730 Sleep apnea, unspecified: Secondary | ICD-10-CM | POA: Diagnosis not present

## 2021-12-28 LAB — RETICULOCYTES
Immature Retic Fract: 9.3 % (ref 2.3–15.9)
RBC.: 3.51 MIL/uL — ABNORMAL LOW (ref 4.22–5.81)
Retic Count, Absolute: 38.6 10*3/uL (ref 19.0–186.0)
Retic Ct Pct: 1.1 % (ref 0.4–3.1)

## 2021-12-28 LAB — CBC WITH DIFFERENTIAL (CANCER CENTER ONLY)
Abs Immature Granulocytes: 0.01 10*3/uL (ref 0.00–0.07)
Basophils Absolute: 0.1 10*3/uL (ref 0.0–0.1)
Basophils Relative: 1 %
Eosinophils Absolute: 0.1 10*3/uL (ref 0.0–0.5)
Eosinophils Relative: 2 %
HCT: 36 % — ABNORMAL LOW (ref 39.0–52.0)
Hemoglobin: 12.3 g/dL — ABNORMAL LOW (ref 13.0–17.0)
Immature Granulocytes: 0 %
Lymphocytes Relative: 23 %
Lymphs Abs: 1.3 10*3/uL (ref 0.7–4.0)
MCH: 35.3 pg — ABNORMAL HIGH (ref 26.0–34.0)
MCHC: 34.2 g/dL (ref 30.0–36.0)
MCV: 103.4 fL — ABNORMAL HIGH (ref 80.0–100.0)
Monocytes Absolute: 0.5 10*3/uL (ref 0.1–1.0)
Monocytes Relative: 8 %
Neutro Abs: 3.8 10*3/uL (ref 1.7–7.7)
Neutrophils Relative %: 66 %
Platelet Count: 194 10*3/uL (ref 150–400)
RBC: 3.48 MIL/uL — ABNORMAL LOW (ref 4.22–5.81)
RDW: 12.8 % (ref 11.5–15.5)
WBC Count: 5.7 10*3/uL (ref 4.0–10.5)
nRBC: 0 % (ref 0.0–0.2)

## 2021-12-28 LAB — CMP (CANCER CENTER ONLY)
ALT: 21 U/L (ref 0–44)
AST: 26 U/L (ref 15–41)
Albumin: 4.4 g/dL (ref 3.5–5.0)
Alkaline Phosphatase: 64 U/L (ref 38–126)
Anion gap: 5 (ref 5–15)
BUN: 19 mg/dL (ref 8–23)
CO2: 30 mmol/L (ref 22–32)
Calcium: 9.9 mg/dL (ref 8.9–10.3)
Chloride: 102 mmol/L (ref 98–111)
Creatinine: 1.01 mg/dL (ref 0.61–1.24)
GFR, Estimated: >60 mL/min (ref >60)
Glucose, Bld: 97 mg/dL (ref 70–99)
Potassium: 4 mmol/L (ref 3.5–5.1)
Sodium: 137 mmol/L (ref 135–145)
Total Bilirubin: 0.6 mg/dL (ref 0.3–1.2)
Total Protein: 7.9 g/dL (ref 6.5–8.1)

## 2021-12-28 LAB — LACTATE DEHYDROGENASE: LDH: 177 U/L (ref 98–192)

## 2021-12-28 LAB — VITAMIN B12: Vitamin B-12: 1433 pg/mL — ABNORMAL HIGH (ref 180–914)

## 2021-12-28 NOTE — Telephone Encounter (Signed)
Per 12/28/21 los - called and gave upcoming appointments

## 2021-12-28 NOTE — Progress Notes (Signed)
Hematology and Oncology Follow Up Visit  Troy Willis [id number] 10-Sep-1945 76 y.o. 12/28/2021   Principle Diagnosis:  Macrocytic anemia   Current Therapy:   B12 2500 mcg PO daily   Interim History:  Troy Willis is here today for follow-up. He is doing fairly well but notes some fatigue and loss of appetite. He has been under a good deal of stress lately as his precious wife was recently diagnosed with cancer and has started treatment.  He has been taking his B 12 supplement and Hgb is improved at 12.3 with MCV 103.  No blood loss, bruising or petechiae noted.  He has started wearing a CPAP for recent diagnosis of severe sleep apnea.  No fever, chills, n/v, cough, rash, dizziness, SOB, chest pain, abdominal pain or changes in bladder habits at this time.  He has constipation and notes that taking a stool softener daily has helped.  No swelling or tenderness in his extremities.  Numbness and tingling in his toes unchanged from baseline.  No falls or syncope reported.  He is eating regularly despite not having an appetite and is doing his best to stay well hydrated. His weight is 163 lbs. This is down 9 lbs total in the last 3 months. We will continue to watch.   ECOG Performance Status: 1 - Symptomatic but completely ambulatory  Medications:  Allergies as of 12/28/2021       Reactions   Amiodarone Other (See Comments)   elevated liver enzymes   Atorvastatin Other (See Comments)   Nocturnal leg cramps   Black Pepper-turmeric Other (See Comments)   heartburn   Metoprolol Other (See Comments)   Hypotension        Medication List        Accurate as of December 28, 2021  8:49 AM. If you have any questions, ask your nurse or doctor.          ezetimibe 10 MG tablet Commonly known as: ZETIA Take 1 tablet (10 mg total) by mouth daily.   famotidine 20 MG tablet Commonly known as: PEPCID Take 20 mg by mouth daily as needed for heartburn or indigestion.   Flutter  Devi Use as directed   furosemide 20 MG tablet Commonly known as: LASIX Take 1 tablet (20 mg total) by mouth daily.   hydrocortisone 2.5 % ointment Apply 1 application topically 2 (two) times daily as needed (irritation).   Melatonin 5 MG Caps Take 5 mg by mouth at bedtime.   Mucinex Maximum Strength 1200 MG Tb12 Generic drug: Guaifenesin Take 1,200 mg by mouth at bedtime. As needed   nitroGLYCERIN 0.4 MG SL tablet Commonly known as: NITROSTAT Place 1 tablet (0.4 mg total) under the tongue every 5 (five) minutes x 3 doses as needed for chest pain.   pravastatin 20 MG tablet Commonly known as: PRAVACHOL Take 1 tablet (20 mg total) by mouth 3 (three) times a week.   triamcinolone cream 0.1 % Commonly known as: KENALOG Apply 1 application topically daily as needed (rash).   Vitamin D3 50 MCG (2000 UT) Tabs Take 2,000 Units by mouth daily.   Xarelto 20 MG Tabs tablet Generic drug: rivaroxaban TAKE ONE TABLET BY MOUTH ONE TIME DAILY WITH SUPPER        Allergies:  Allergies  Allergen Reactions   Amiodarone Other (See Comments)    elevated liver enzymes   Atorvastatin Other (See Comments)    Nocturnal leg cramps   Black Pepper-Turmeric Other (See Comments)  heartburn   Metoprolol Other (See Comments)    Hypotension     Past Medical History, Surgical history, Social history, and Family History were reviewed and updated.  Review of Systems: All other 10 point review of systems is negative.   Physical Exam:  weight is 163 lb (73.9 kg). His oral temperature is 98.2 F (36.8 C). His blood pressure is 121/70 and his pulse is 51 (abnormal). His respiration is 18 and oxygen saturation is 100%.   Wt Readings from Last 3 Encounters:  12/28/21 163 lb (73.9 kg)  12/21/21 164 lb 1.6 oz (74.4 kg)  09/28/21 172 lb 12.8 oz (78.4 kg)    Ocular: Sclerae unicteric, pupils equal, round and reactive to light Ear-nose-throat: Oropharynx clear, dentition fair Lymphatic: No  cervical or supraclavicular adenopathy Lungs no rales or rhonchi, good excursion bilaterally Heart regular rate and rhythm, no murmur appreciated Abd soft, nontender, positive bowel sounds MSK no focal spinal tenderness, no joint edema Neuro: non-focal, well-oriented, appropriate affect Breasts: Deferred   Lab Results  Component Value Date   WBC 5.7 12/28/2021   HGB 12.3 (L) 12/28/2021   HCT 36.0 (L) 12/28/2021   MCV 103.4 (H) 12/28/2021   PLT 194 12/28/2021   Lab Results  Component Value Date   FERRITIN 53 09/28/2021   IRON 187 (H) 09/28/2021   TIBC 325 09/28/2021   UIBC 138 09/28/2021   IRONPCTSAT 58 (H) 09/28/2021   Lab Results  Component Value Date   RETICCTPCT 1.1 12/28/2021   RBC 3.48 (L) 12/28/2021   RBC 3.51 (L) 12/28/2021   No results found for: "KPAFRELGTCHN", "LAMBDASER", "KAPLAMBRATIO" No results found for: "IGGSERUM", "IGA", "IGMSERUM" No results found for: "TOTALPROTELP", "ALBUMINELP", "A1GS", "A2GS", "BETS", "BETA2SER", "GAMS", "MSPIKE", "SPEI"   Chemistry      Component Value Date/Time   NA 138 09/28/2021 0815   NA 139 07/12/2021 0821   K 4.0 09/28/2021 0815   CL 103 09/28/2021 0815   CO2 30 09/28/2021 0815   BUN 15 09/28/2021 0815   BUN 19 07/12/2021 0821   CREATININE 0.94 09/28/2021 0815   CREATININE 0.86 04/01/2016 0829      Component Value Date/Time   CALCIUM 9.8 09/28/2021 0815   ALKPHOS 64 09/28/2021 0815   AST 24 09/28/2021 0815   ALT 14 09/28/2021 0815   BILITOT 0.9 09/28/2021 0815       Impression and Plan: Troy Willis is a very pleasant 76 yo caucasian gentleman with mild anemia noted over the last 6 years.  B 12 pending. He will continue his daily supplement.  Follow-up in 3 months.   Troy Dawson, NP 8/15/20238:49 AM

## 2022-01-04 ENCOUNTER — Encounter: Payer: Self-pay | Admitting: Acute Care

## 2022-01-04 ENCOUNTER — Ambulatory Visit: Payer: PPO | Admitting: Acute Care

## 2022-01-04 VITALS — BP 106/62 | HR 62 | Temp 98.2°F | Ht 75.0 in | Wt 165.4 lb

## 2022-01-04 DIAGNOSIS — G4733 Obstructive sleep apnea (adult) (pediatric): Secondary | ICD-10-CM

## 2022-01-04 DIAGNOSIS — J849 Interstitial pulmonary disease, unspecified: Secondary | ICD-10-CM

## 2022-01-04 DIAGNOSIS — Z9989 Dependence on other enabling machines and devices: Secondary | ICD-10-CM

## 2022-01-04 NOTE — Progress Notes (Addendum)
History of Present Illness Troy Willis is a 76 y.o. 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 Troy KitchenHe is followed by Dr. Valeta Harms   Pt. Had Covid 09/2020. Full PFT's were done 03/23/2021 which showed Moderately reduced DLCO and mild restriction , but drop from 63% to 53% in 2.5 years. His HRCT did not show progression.  It is possible that some of this could be due to deconditioning. 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  He states his bronchiectasis has been very stable, he uses his Mucinex and flutter valve as needed. He is one of the most compliant patient's I have. Very self aware and works very hard to remain in shape.   01/04/2022 Pt. Presents for follow up. He is here today for CPAP review. He has not been exercising as he has in the past as his wife has had multiple health problems, and was recently diagnosed with metastatic lymphoma stage IV. He is unable to exercise as he is caring for his wife. She has been started on chemo and is being followed by Dr. Jonette Willis. Mr. Fadeley and his wife are moving to independent living from their home of 44 years. This has been a very stressful several months for the patient and his family. He is caring for his wife on his own. He has not been able to exercise as he has done on the past. He states he feels his breathing is stable. He is due for his HRCT to check for ILD progression 03/2022.We will schedule him for PFT's in  November also, but will try to spread these out as he has so much going on with his wife at present. He feels his bronchiectasis is well controlled at present. He is good to start flutter valve and mucinex when he has chest congestion.   He is here to evaluate for his new start CPAP.His sleep study showed severe sleep apnea. He has been using his CPAP for  about 2 months.   He feels this does improve his sleep. He is having to get up several times a night with his wife at present, so until she is better he will have night time awakenings  that are not due to sleep disturbances. Troy Willis He is compliant with his treatment , wears it 6-7 hours per night. His AHI is controlled at 2.9. He does feel the pressure is a bit high. Median pressure is 9 cm H20, so we will decrease his pressures to 5-12 cm H2O from 5-15 cm H2O. Cardiology did an echo 11/2021, which they felt was unchanged since his previous echo. I did not see a PASP noted on the reading, but he does not endorse worsening dyspnea. Oxygen sats were 100% today on RA.   Test Results: Home Sleep Study  AHI of 50.4 per hour of sleep Oxygen saturation nadir of 76%, with an average sat of 91%.   CPAP Down Load 01/04/2022 Auto Set 5-15 cm H2O      Echo 11/2021 Left ventricular ejection fraction, by estimation, is 55 to 60%. The left ventricle has normal function. The left ventricle has no regional wall motion abnormalities. Left ventricular diastolic parameters were normal. The average left ventricular global longitudinal strain is -18.4 %. The global longitudinal strain is normal. 2. Right ventricular systolic function is normal. The  right ventricular size is normal. 3. Left atrial size was moderately dilated. 4. Right atrial size was severely dilated. 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 The mitral valve is abnormal. Moderate mitral valve regurgitation. No evidence of mitral stenosis. 6. Tricuspid valve regurgitation is moderate. 7 The aortic valve is tricuspid. There is mild calcification of the aortic valve. Aortic valve regurgitation is trivial. Aortic valve sclerosis is present, with no evidence of aortic valve stenosis. The inferior vena cava is dilated in size with >50% respiratory variability, suggesting right  atrial pressure of 8 mmHg.     HRCT 11/2020 Pulmonary parenchymal pattern of interstitial lung disease, as described above, appears similar to 10/21/2019 and may be due to fibrotic nonspecific interstitial pneumonitis or usual interstitial pneumonitis. Findings are indeterminate for UIP per consensus guidelines: Diagnosis of Idiopathic Pulmonary Fibrosis: An Official ATS/ERS/JRS/ALAT Clinical Practice Guideline. Mena, Iss 5, [phone], Jan 14 2017. 2. Acute or subacute appearing fractures of the left eighth through eleventh posterolateral ribs. 3. Scattered pulmonary nodules measure 5 mm or less in size, stable and considered benign. 4. Aortic atherosclerosis (ICD10-I70.0). Coronary artery calcification.     Latest Ref Rng & Units 12/28/2021    7:50 AM 09/28/2021    8:15 AM 09/20/2021    4:13 PM  CBC  WBC 4.0 - 10.5 K/uL 5.7  5.2  5.3   Hemoglobin 13.0 - 17.0 g/dL 12.3  11.9  11.1   Hematocrit 39.0 - 52.0 % 36.0  35.5  33.2   Platelets 150 - 400 K/uL 194  174  159.0        Latest Ref Rng & Units 12/28/2021    7:50 AM 09/28/2021    8:15 AM 07/12/2021    8:21 AM  BMP  Glucose 70 - 99 mg/dL 97  86  94   BUN 8 - 23 mg/dL '19  15  19   '$ Creatinine 0.61 - 1.24 mg/dL 1.01  0.94  0.99   BUN/Creat Ratio 10 - 24   19   Sodium 135 - 145 mmol/L 137  138  139   Potassium 3.5 - 5.1 mmol/L 4.0  4.0  4.2   Chloride 98 - 111 mmol/L 102  103  102   CO2 22 - 32 mmol/L '30  30  26   '$ Calcium 8.9 - 10.3 mg/dL 9.9  9.8  9.2     BNP    Component Value Date/Time   BNP 225.1 (H) 05/19/2021 0828    ProBNP    Component Value Date/Time   PROBNP 225.0 (H) 03/26/2019 0909    PFT    Component Value Date/Time   FEV1PRE 2.65 03/23/2021 0857   FEV1POST 2.78 03/23/2021 0857   FVCPRE 3.62 03/23/2021 0857   FVCPOST 3.73 03/23/2021 0857   TLC 5.83 03/23/2021 0857   DLCOUNC 15.10 03/23/2021 0857   PREFEV1FVCRT 73 03/23/2021 0857   PSTFEV1FVCRT 74 03/23/2021 0857     No results found.   Past medical hx Past Medical History:  Diagnosis Date   Acquired dilation of ascending aorta and aortic root (HCC)    26m ascending aorta by echo 09/2020   Anemia    mild    Arthritis    hands, lower back   Bronchiectasis (HCC)    CAD (coronary artery disease), native coronary artery    a. 08/2015 Cath/PCI: LM nl, LAD 550mLCX small, nl, RCA 80p (  4.0x16 Synergy DES),    Cancer ([place])    skin on forehead   Cataracts, bilateral    surgery   Depression    pt denies    Diverticulosis    Dyspnea    occasional    GERD (gastroesophageal reflux disease)    Hilar adenopathy    a. 08/2015 CT Chest: mild bilat hilar adenopathy and mild soft tissue prominence in inf right hilum, left hilum. Also areas of soft tissue and low-density material filling LLL bronchial airways->? mucous vs mass.   Hx of adenomatous colonic polyps 08/13/2015   Hyperlipidemia    MVP (mitral valve prolapse)    bileaflet with  moderate MR by echo 09/2020   Myocardial infarction [facility])    Nonspecific elevation of levels of transaminase or lactic acid dehydrogenase (LDH)    PMH of ; ? 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 CHA2DS2VASc = 2 -->Xarelto started 08/2015.   Pneumonia, bacterial 05/18/2012   Pulmonary HTN (HCC)    PASP 533mHg on echo 08/2019>>Likely Group 3 related to bronchiectasis>>repeat echo 09/2020 with PASP 461mg   Pulmonary nodules    a. 08/2015 CT Chest: reticulonodular opacities in RUL - largest 33m8m likely 2/2 inflammatory process.   [facility]  Tricuspid regurgitation      Social History   Tobacco Use   Smoking status: Never   Smokeless tobacco: Former    Types: Chew    Quit date: 05/16/1978   Tobacco comments:    occasional cigar in past; not regular smoker  Vaping Use   Vaping Use: Never used  Substance Use Topics   Alcohol use: Yes    Comment: 5 beers per week    Drug use: No    Mr.Finchum  reports that he has never smoked. He quit smokeless tobacco use about 43 years ago.  His smokeless tobacco use included chew. He reports current alcohol use. He reports that he does not use drugs.  Tobacco Cessation: Never smoker   Past surgical hx, Family hx, Social hx all reviewed.  Current Outpatient Medications on File Prior to Visit  Medication Sig   Cholecalciferol (VITAMIN D3) 2000 UNITS TABS Take 2,000 Units by mouth daily.    ezetimibe (ZETIA) 10 MG tablet Take 1 tablet (10 mg total) by mouth daily.   famotidine (PEPCID) 20 MG tablet Take 20 mg by mouth daily as needed for heartburn or indigestion.   furosemide (LASIX) 20 MG tablet Take 1 tablet (20 mg total) by mouth daily.   Guaifenesin (MUCINEX MAXIMUM STRENGTH) 1200 MG TB12 Take 1,200 mg by mouth at bedtime. As needed   hydrocortisone 2.5 % ointment Apply 1 application topically 2 (two) times daily as needed (irritation).    Melatonin 5 MG CAPS Take 5 mg by mouth at bedtime.   nitroGLYCERIN (NITROSTAT) 0.4 MG SL tablet Place 1 tablet (0.4 mg total) under the tongue every 5 (five) minutes x 3 doses as needed for chest pain.   Respiratory Therapy Supplies (FLUTTER) DEVI Use as directed   rivaroxaban (XARELTO) 20 MG TABS tablet TAKE ONE TABLET BY MOUTH ONE TIME DAILY WITH SUPPER   triamcinolone cream (KENALOG) 0.1 % Apply 1 application topically daily as needed (rash).    pravastatin (PRAVACHOL) 20 MG tablet Take 1 tablet (20 mg total) by mouth 3 (three) times a week. (Patient not taking: Reported on 01/04/2022)   Current Facility-Administered Medications on File Prior to  Visit  Medication   gemcitabine (GEMZAR) chemo syringe for bladder instillation 2,000 mg     Allergies  Allergen Reactions   Amiodarone Other (See Comments)    elevated liver enzymes   Atorvastatin Other (See Comments)    Nocturnal leg cramps   Black Pepper-Turmeric Other (See Comments)    heartburn   Metoprolol Other (See Comments)    Hypotension      Review Of Systems:  Constitutional:   No  weight loss, night sweats,  Fevers, chills,+ fatigue, or  lassitude.  HEENT:   No headaches,  Difficulty swallowing,  Tooth/dental problems, or  Sore throat,                No sneezing, itching, ear ache, nasal congestion, post nasal drip,   CV:  No chest pain,  Orthopnea, PND, swelling in lower extremities, anasarca, dizziness, palpitations, syncope.   GI  No heartburn, indigestion, abdominal pain, nausea, vomiting, diarrhea, change in bowel habits, loss of appetite, bloody stools.   Resp: No shortness of breath with exertion or at rest.  No excess mucus, no productive cough,  No non-productive cough,  No coughing up of blood.  No change in color of mucus.  No wheezing.  No chest wall deformity  Skin: no rash or lesions.  GU: no dysuria, change in color of urine, no urgency or frequency.  No flank pain, no hematuria   MS:  No joint pain or swelling.  No decreased range of motion.  No back pain.  Psych:  No change in mood or affect. No depression or anxiety.  No memory loss.   Vital Signs BP 106/62 (BP Location: Left Arm, Cuff Size: Normal)   Pulse 62   Temp 98.2 F (36.8 C) (Temporal)   Ht '6\' 3"'$  (1.905 m)   Wt 165 lb 6.4 oz (75 kg)   SpO2 100%   BMI 20.67 kg/m    Physical Exam:  General- No distress,  A&Ox3, pleasant ENT: No sinus tenderness, TM clear, pale nasal mucosa, no oral exudate,no post nasal drip, no LAN Cardiac: S1, S2, regular rate and rhythm, no murmur Chest: No wheeze/ rales/ dullness; no accessory muscle use, no nasal flaring, no sternal retractions, crackles per bases bilaterally Abd.: Soft Non-tender, ND, BS +, Body mass index is 20.67 kg/m.  Ext: No clubbing cyanosis, edema Neuro:  normal strength, MAE x 4, A&O x 3, appropriate Skin: No rashes, warm and dry, no lesions  Psych: normal mood and behavior   Assessment/Plan  OSA on CPAP Continue with CPAP as you have been doing. We will ask the DME to  reduce your settings to Auto Set 5-12 cm pressure H2O.  We will order an overnight oximetry to ensure the CPAP has improved your oxygen level drops.  You should get a call to schedule these.  Follow up in 6 weeks with down load to ensure the change is effective, and AHI is still controlled.  HRCT in 03/2022.   Continue on CPAP at bedtime. You appear to be benefiting from the treatment  Goal is to wear for at least 6 hours each night for maximal clinical benefit. Continue to work on weight loss, as the link between excess weight  and sleep apnea is well established.   Remember to establish a good bedtime routine, and work on sleep hygiene.  Limit daytime naps , avoid stimulants such as caffeine and nicotine close to bedtime, exercise daily to promote sleep quality, avoid heavy , spicy, fried ,  or rich foods before bed. Ensure adequate exposure to natural light during the day,establish a relaxing bedtime routine with a pleasant sleep environment ( Bedroom between 60 and 67 degrees, turn off bright lights , TV or device screens screens , consider black out curtains or white noise machines) Do not drive if sleepy. Remember to clean mask, tubing, filter, and reservoir once weekly with soapy water.  Follow up with Jo Cerone NP   In 6 weeks  or before as needed.  Please contact office for sooner follow up if symptoms do not improve or worsen or seek emergency care  NSIP Stable per HRCT 11/2020 Decrease in DLCO since last PFT from 63% to 53% Possibly physical deconditioning>> walks 2 miles 3 times a week.  Plan Consider repeat HRCT Work on slowly increasing activity as you can    [place] with cards>>> last echo 11/2021 PASP has decreased from 83mHg to 411mg and MR is now moderate Plan Continue to follow up with cardiology Annual Echos per cards   Bronchiectasis Stable Plan Mucinex and flutter valve Call at earliest signs of flare   New Wheezing when laying down Concern for  GERD Plan Try a 2 week trial of Omeprazole 20 mg daily to see if wheezing resolves. If this helps consider continuing If it does not help, ok to to stop   Health Maintenance Plan Flu vaccine up to date Covid booster up to date   I spent 40 minutes dedicated to the care of this patient on the date of this encounter to include pre-visit review of records, face-to-face time with the patient discussing conditions above, post visit ordering of testing, clinical documentation with the electronic health record, making appropriate referrals as documented, and communicating necessary information to the patient's healthcare team.    SaMagdalen SpatzNP 01/04/2022  5:33 PM

## 2022-01-04 NOTE — Patient Instructions (Addendum)
It is good to see you today. We will order an HRCT to evaluate for any progression of ILD We will order PFT's as they are due 03/2022 Continue with CPAP as you have been doing. We will ask the DME to reduce your settings to Auto Set 5-12 cm pressure H2O.  We will order an overnight oximetry to ensure the CPAP has improved your oxygen level drops.  You should get a call to schedule these.  Follow up in 6 weeks with down load to ensure the change is effective, and AHI is still controlled.  HRCT in 03/2022.   Continue on CPAP at bedtime. You appear to be benefiting from the treatment  Goal is to wear for at least 6 hours each night for maximal clinical benefit. Continue to work on weight loss, as the link between excess weight  and sleep apnea is well established.   Remember to establish a good bedtime routine, and work on sleep hygiene.  Limit daytime naps , avoid stimulants such as caffeine and nicotine close to bedtime, exercise daily to promote sleep quality, avoid heavy , spicy, fried , or rich foods before bed. Ensure adequate exposure to natural light during the day,establish a relaxing bedtime routine with a pleasant sleep environment ( Bedroom between 60 and 67 degrees, turn off bright lights , TV or device screens screens , consider black out curtains or white noise machines) Do not drive if sleepy. Remember to clean mask, tubing, filter, and reservoir once weekly with soapy water.  Follow up with Arnetia Bronk NP   In 6 weeks  or before as needed.  Please contact office for sooner follow up if symptoms do not improve or worsen or seek emergency care

## 2022-01-11 ENCOUNTER — Other Ambulatory Visit (HOSPITAL_BASED_OUTPATIENT_CLINIC_OR_DEPARTMENT_OTHER): Payer: Self-pay

## 2022-01-11 DIAGNOSIS — G4730 Sleep apnea, unspecified: Secondary | ICD-10-CM | POA: Diagnosis not present

## 2022-01-11 DIAGNOSIS — R0683 Snoring: Secondary | ICD-10-CM | POA: Diagnosis not present

## 2022-01-12 ENCOUNTER — Telehealth: Payer: Self-pay | Admitting: Acute Care

## 2022-01-12 NOTE — Telephone Encounter (Addendum)
Received a fax from [place] [facility] requesting cpap follow-up office visit notes.  Faxed 01/04/2022 notes to [phone]

## 2022-01-18 DIAGNOSIS — G4733 Obstructive sleep apnea (adult) (pediatric): Secondary | ICD-10-CM | POA: Diagnosis not present

## 2022-01-21 ENCOUNTER — Telehealth: Payer: Self-pay | Admitting: Acute Care

## 2022-01-21 ENCOUNTER — Ambulatory Visit: Payer: PPO | Admitting: Acute Care

## 2022-01-21 NOTE — Telephone Encounter (Signed)
Please call patient and let him know his Overnight oximetry showed that his CPAP therapy has taken care of his night time oxygen desaturations and he does not need night time oxygen in addition to the CPAP. This is great news.  Have him continue CPAP as he has been doing. We will se eim at follow up as is scheduled. 02/15/2022 Thanks so much

## 2022-01-24 NOTE — Telephone Encounter (Addendum)
I called the patient and he voices understanding. He is aware to keep the follow up for his CPAP. He reports that he will keep his follow up scan on 03/2022. Nothing further needed.

## 2022-01-26 ENCOUNTER — Other Ambulatory Visit (HOSPITAL_BASED_OUTPATIENT_CLINIC_OR_DEPARTMENT_OTHER): Payer: Self-pay

## 2022-02-04 ENCOUNTER — Other Ambulatory Visit (HOSPITAL_BASED_OUTPATIENT_CLINIC_OR_DEPARTMENT_OTHER): Payer: Self-pay

## 2022-02-09 ENCOUNTER — Other Ambulatory Visit (HOSPITAL_BASED_OUTPATIENT_CLINIC_OR_DEPARTMENT_OTHER): Payer: Self-pay

## 2022-02-09 MED ORDER — FLUAD QUADRIVALENT 0.5 ML IM PRSY
PREFILLED_SYRINGE | INTRAMUSCULAR | 0 refills | Status: DC
  Filled 2022-02-09: qty 0.5, 1d supply, fill #0

## 2022-02-15 ENCOUNTER — Other Ambulatory Visit (HOSPITAL_BASED_OUTPATIENT_CLINIC_OR_DEPARTMENT_OTHER): Payer: Self-pay

## 2022-02-15 ENCOUNTER — Encounter: Payer: Self-pay | Admitting: Acute Care

## 2022-02-15 ENCOUNTER — Ambulatory Visit: Payer: PPO | Admitting: Acute Care

## 2022-02-15 VITALS — BP 108/66 | HR 51 | Temp 97.7°F | Ht 75.0 in | Wt 165.2 lb

## 2022-02-15 DIAGNOSIS — Z789 Other specified health status: Secondary | ICD-10-CM | POA: Diagnosis not present

## 2022-02-15 DIAGNOSIS — I2721 Secondary pulmonary arterial hypertension: Secondary | ICD-10-CM | POA: Diagnosis not present

## 2022-02-15 DIAGNOSIS — J479 Bronchiectasis, uncomplicated: Secondary | ICD-10-CM

## 2022-02-15 DIAGNOSIS — G4733 Obstructive sleep apnea (adult) (pediatric): Secondary | ICD-10-CM | POA: Diagnosis not present

## 2022-02-15 MED ORDER — AREXVY 120 MCG/0.5ML IM SUSR
INTRAMUSCULAR | 0 refills | Status: DC
  Filled 2022-02-15: qty 0.5, 1d supply, fill #0

## 2022-02-15 NOTE — Patient Instructions (Addendum)
It is good to see you today. Your pressures do not appear to have been changed by the DME.  If the current pressures are not bothering you we can leave them as they are set. They are controlling your Sleep Apnea.  We will place an order for a mask fitting. They will call you to get this scheduled.  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 Get RSV and Covid vaccines when available.  Continue to exercise regularly. Follow up in 3 months with [name] NP ( due 05/2022) or Dr. Lake Bells.  If you need [place] sooner please call. Flutter valve and Mucinex for chest congestion Annual follow up with cardiology Continue on CPAP at bedtime. You appear to be benefiting from the treatment  Goal is to wear for at least 6 hours each night for maximal clinical benefit. Continue to work on weight loss, as the link between excess weight  and sleep apnea is well established.   Remember to establish a good bedtime routine, and work on sleep hygiene.  Limit daytime naps , avoid stimulants such as caffeine and nicotine close to bedtime, exercise daily to promote sleep quality, avoid heavy , spicy, fried , or rich foods before bed. Ensure adequate exposure to natural light during the day,establish a relaxing bedtime routine with a pleasant sleep environment ( Bedroom between 60 and 67 degrees, turn off bright lights , TV or device screens screens , consider black out curtains or white noise machines) Do not drive if sleepy. Remember to clean mask, tubing, filter, and reservoir once weekly with soapy water.

## 2022-02-15 NOTE — Progress Notes (Addendum)
History of Present Illness Troy Willis is a 76 y.o. 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 Marland KitchenHe is followed by Dr. Valeta Harms    Pt. Had Covid 09/2020. Full PFT's were done 03/23/2021 which showed Moderately reduced DLCO and mild restriction , but drop from 63% to 53% in 2.5 years. His HRCT did not show progression.  It is possible that some of this could be due to deconditioning and Covid virus. He has been taking care of his wife who has been undergoing chemotherapy treatments , and has had less time to exercise. [name] His last echo actually showed a decrease in his pulmonary artery hypertension, and his last HRCT showed stable ILD, however both echo and CT were done post COVID.  He states his bronchiectasis has been very stable, he uses his Mucinex and flutter valve as needed. He is one of the most compliant patient's I have. Very self aware and works very hard to remain in shape.    02/15/2022 Pt. Presents for follow up to ensure CPAP pressure changes are maintaining adequate control of his OSA. He states he has been doing well. We did an O&O to ensure he had improvement of his nocturnal desaturations with addition of CPAP therapy. O&O confirmed his desaturation were resolved  by his CPAP therapy which is great news.His Down Load today reveals that the DME did not make any changes to his CPAP settings. He states that he has not been bothered by the pressure, so he is fine if pressures remain the same. He does want a different mask. He states he has a long nose which makes getting the right mask difficult. We will send him for a mask fitting at his DME.  He feels his bronchiectasis is well controlled. He uses Mucinex and flutter valve as needed.  PAH is stable per his last Echio ( Followed by [facility])   Test Results: CPAP Down Load  5-15 Auto Set  Down Load 02/15/2022 AHI>> 3.2 Usage 30/30 days 7 hours and 19 minutes median  usage         Echo 11/2021 Left ventricular ejection fraction, by estimation, is 55 to 60%. The left ventricle has normal function. The left ventricle has no regional wall motion abnormalities. Left ventricular diastolic parameters were normal. The average left ventricular global longitudinal strain is -18.4 %. The global longitudinal strain is normal. 2. Right ventricular systolic function is normal. The right ventricular size is normal. 3. Left atrial size was moderately dilated. 4. Right atrial size was severely dilated. 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 The mitral valve is abnormal. Moderate mitral valve regurgitation. No evidence of mitral stenosis. 6. Tricuspid valve regurgitation is moderate. 7 The aortic valve is tricuspid. There is mild calcification of the aortic valve. Aortic valve regurgitation is trivial. Aortic valve sclerosis is present, with no evidence of aortic valve stenosis. The inferior vena cava is dilated in size with >50% respiratory variability, suggesting right atrial pressure of 8 mmHg.       HRCT 11/2020 Pulmonary parenchymal pattern of interstitial lung disease, as described above, appears similar to 10/21/2019 and may be due to fibrotic nonspecific interstitial pneumonitis or usual interstitial pneumonitis. Findings are indeterminate for UIP per consensus guidelines: Diagnosis of Idiopathic Pulmonary Fibrosis: An Official ATS/ERS/JRS/ALAT Clinical Practice Guideline. [place], Iss 5, [phone], Jan 14 2017. 2. Acute or subacute appearing fractures of the left eighth through eleventh posterolateral ribs. 3. Scattered pulmonary nodules measure 5 mm or less in size, stable and considered benign. 4. Aortic atherosclerosis (ICD10-I70.0). Coronary artery calcification.     Latest Ref Rng & Units 12/28/2021    7:50 AM 09/28/2021    8:15 AM 09/20/2021     4:13 PM  CBC  WBC 4.0 - 10.5 K/uL 5.7  5.2  5.3   Hemoglobin 13.0 - 17.0 g/dL 12.3  11.9  11.1   Hematocrit 39.0 - 52.0 % 36.0  35.5  33.2   Platelets 150 - 400 K/uL 194  174  159.0        Latest Ref Rng & Units 12/28/2021    7:50 AM 09/28/2021    8:15 AM 07/12/2021    8:21 AM  BMP  Glucose 70 - 99 mg/dL 97  86  94   BUN 8 - 23 mg/dL '19  15  19   '$ Creatinine 0.61 - 1.24 mg/dL 1.01  0.94  0.99   BUN/Creat Ratio 10 - 24   19   Sodium 135 - 145 mmol/L 137  138  139   Potassium 3.5 - 5.1 mmol/L 4.0  4.0  4.2   Chloride 98 - 111 mmol/L 102  103  102   CO2 22 - 32 mmol/L '30  30  26   '$ Calcium 8.9 - 10.3 mg/dL 9.9  9.8  9.2     BNP    Component Value Date/Time   BNP 225.1 (H) 05/19/2021 0828    ProBNP    Component Value Date/Time   PROBNP 225.0 (H) 03/26/2019 0909    PFT    Component Value Date/Time   FEV1PRE 2.65 03/23/2021 0857   FEV1POST 2.78 03/23/2021 0857   FVCPRE 3.62 03/23/2021 0857   FVCPOST 3.73 03/23/2021 0857   TLC 5.83 03/23/2021 0857   DLCOUNC 15.10 03/23/2021 0857   PREFEV1FVCRT 73 03/23/2021 0857   PSTFEV1FVCRT 74 03/23/2021 0857    No results found.   Past medical hx Past Medical History:  Diagnosis Date   Acquired dilation of ascending aorta and aortic root ([place])    48m ascending aorta by echo 09/2020   Anemia    mild    Arthritis    hands, lower back   Bronchiectasis (HCC)    CAD (coronary artery disease), native coronary artery    a. 08/2015 Cath/PCI: LM nl, LAD 567mLCX small, nl, RCA 80p (4.0x16 Synergy DES),    Cancer (HCC)    skin on forehead   Cataracts, bilateral    surgery   Depression    pt denies    Diverticulosis    Dyspnea    occasional    GERD (gastroesophageal reflux disease)    Hilar adenopathy    a. 08/2015 CT Chest: mild bilat hilar adenopathy and mild soft tissue prominence in inf right hilum, left hilum. Also areas of soft tissue and low-density material filling LLL bronchial airways->? mucous vs mass.   Hx of  adenomatous colonic polyps 08/13/2015   Hyperlipidemia    MVP (mitral valve prolapse)    bileaflet with  moderate MR by echo 09/2020   [facility]   Nonspecific elevation of levels of transaminase or lactic acid dehydrogenase (LDH)    PMH of ; ? 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 CHA2DS2VASc = 2 -->Xarelto started 08/2015.  Pneumonia, bacterial 05/18/2012   Pulmonary HTN (HCC)    PASP 76mHg on echo 08/2019>>Likely Group 3 related to bronchiectasis>>repeat echo 09/2020 with PASP 4621mg   Pulmonary nodules    a. 08/2015 CT Chest: reticulonodular opacities in RUL - largest 21m721m likely 2/2 inflammatory process.   [facility]  Tricuspid regurgitation      Social History   Tobacco Use   Smoking status: Never   Smokeless tobacco: Former    Types: Chew    Quit date: 05/16/1978   Tobacco comments:    occasional cigar in past; not regular smoker  Vaping Use   Vaping Use: Never used  Substance Use Topics   Alcohol use: Yes    Comment: 5 beers per week    Drug use: No    Mr.Reeg reports that he has never smoked. He quit smokeless tobacco use about 43 years ago.  His smokeless tobacco use included chew. He reports current alcohol use. He reports that he does not use drugs.  Tobacco Cessation: Never smoker   Past surgical hx, Family hx, Social hx all reviewed.  Current Outpatient Medications on File Prior to Visit  Medication Sig   Cholecalciferol (VITAMIN D3) 2000 UNITS TABS Take 2,000 Units by mouth daily.    ezetimibe (ZETIA) 10 MG tablet Take 1 tablet (10 mg total) by mouth daily.   famotidine (PEPCID) 20 MG tablet Take 20 mg by mouth daily as needed for heartburn or indigestion.   furosemide (LASIX) 20 MG tablet Take 1 tablet (20 mg total) by mouth daily.   Guaifenesin (MUCINEX MAXIMUM STRENGTH) 1200 MG TB12 Take 1,200 mg by mouth at bedtime. As needed   hydrocortisone 2.5 % ointment Apply  1 application topically 2 (two) times daily as needed (irritation).    influenza vaccine adjuvanted (FLUAD QUADRIVALENT) 0.5 ML injection Inject into the muscle.   Melatonin 5 MG CAPS Take 5 mg by mouth at bedtime.   nitroGLYCERIN (NITROSTAT) 0.4 MG SL tablet Place 1 tablet (0.4 mg total) under the tongue every 5 (five) minutes x 3 doses as needed for chest pain.   pravastatin (PRAVACHOL) 20 MG tablet Take 1 tablet (20 mg total) by mouth 3 (three) times a week.   Respiratory Therapy Supplies (FLUTTER) DEVI Use as directed   rivaroxaban (XARELTO) 20 MG TABS tablet TAKE ONE TABLET BY MOUTH ONE TIME DAILY WITH SUPPER   triamcinolone cream (KENALOG) 0.1 % Apply 1 application topically daily as needed (rash).    Current Facility-Administered Medications on File Prior to Visit  Medication   gemcitabine (GEMZAR) chemo syringe for bladder instillation 2,000 mg     Allergies  Allergen Reactions   Amiodarone Other (See Comments)    elevated liver enzymes   Atorvastatin Other (See Comments)    Nocturnal leg cramps   Black Pepper-Turmeric Other (See Comments)    heartburn   Metoprolol Other (See Comments)    Hypotension     Review Of Systems:  Constitutional:   No  weight loss, night sweats,  Fevers, chills, fatigue, or  lassitude.  HEENT:   No headaches,  Difficulty swallowing,  Tooth/dental problems, or  Sore throat,                No sneezing, itching, ear ache, nasal congestion, post nasal drip,   CV:  No chest pain,  Orthopnea, PND, swelling in lower extremities, anasarca, dizziness, palpitations, syncope.   GI  No heartburn, indigestion, abdominal pain, nausea, vomiting, diarrhea, change in bowel  habits, loss of appetite, bloody stools.   Resp: + shortness of breath with exertion less at rest.  + baseline  excess mucus, no productive cough,  No non-productive cough,  No coughing up of blood.  No change in color of mucus.  No wheezing.  No chest wall deformity  Skin: no rash or  lesions.  GU: no dysuria, change in color of urine, no urgency or frequency.  No flank pain, no hematuria   MS:  No joint pain or swelling.  No decreased range of motion.  No back pain.  Psych:  No change in mood or affect. No depression or anxiety.  No memory loss.   Vital Signs BP 108/66 (BP Location: Left Arm, Patient Position: Sitting, Cuff Size: Normal)   Pulse (!) 51   Temp 97.7 F (36.5 C) (Oral)   Ht '6\' 3"'$  (1.905 m)   Wt 165 lb 3.2 oz (74.9 kg)   SpO2 98%   BMI 20.65 kg/m    Physical Exam:  General- No distress,  A&Ox3, pleasant ENT: No sinus tenderness, TM clear, pale nasal mucosa, no oral exudate,no post nasal drip, no LAN Cardiac: S1, S2, regular rate and rhythm, no murmur Chest: No wheeze/ rales/ dullness; no accessory muscle use, no nasal flaring, no sternal retractions, + crackles bilaterally per bases Abd.: Soft Non-tender, ND, BS +, Body mass index is 20.65 kg/m.  Ext: No clubbing cyanosis, edema Neuro:  normal strength, MAE x 4, A&O x 3 Skin: No rashes, warm and dry, no lesions  Psych: normal mood and behavior   Assessment/Plan  OSA on CPAP Overnight Oximetry indicates hypoxemia is resolved with CPAP therapy Continue with CPAP as you have been doing. We will do a mask fitting with your DME to optimize mask and therapy You should get a call to schedule this  HRCT in 03/2022.  We will order a 6 minute walk in November to make sure your numbers are stable. Get RSV and Covid vaccines when available.  Continue to exercise regularly. Follow up in 3 months with [name] NP ( due 05/2022) or Dr. Lake Bells.  If you need [place] sooner please call. Continue on CPAP at bedtime. You appear to be benefiting from the treatment  Goal is to wear for at least 6 hours each night for maximal clinical benefit. Continue to work on weight loss, as the link between excess weight  and sleep apnea is well established.   Remember to establish a good bedtime routine, and work on sleep  hygiene.  Limit daytime naps , avoid stimulants such as caffeine and nicotine close to bedtime, exercise daily to promote sleep quality, avoid heavy , spicy, fried , or rich foods before bed. Ensure adequate exposure to natural light during the day,establish a relaxing bedtime routine with a pleasant sleep environment ( Bedroom between 60 and 67 degrees, turn off bright lights , TV or device screens screens , consider black out curtains or white noise machines) Do not drive if sleepy. Remember to clean mask, tubing, filter, and reservoir once weekly with soapy water.  Follow up with Zully Frane NP   In 6 weeks  or before as needed.  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 Work on slowly increasing activity as you can    [place] with [facility]>>> last echo 11/2021 PASP has decreased from 27mHg to 448mg and MR is now moderate Plan Continue to follow up with cardiology Annual Echos per cards   Bronchiectasis Stable Plan Mucinex and flutter valve as needed for chest congestion  Call at earliest signs of flare   New Wheezing when laying down Concern for GERD Plan  Trial of Omeprazole 20 mg daily made no difference He found this is better with CPAP therapy Continue CPAP therapy  Health Maintenance Wife is immunocompromised due to chemo Plan Get RSV and Covid vaccines when available.   I spent 45 minutes dedicated to the care of this patient on the date of this encounter to include pre-visit review of records, face-to-face time with the patient discussing conditions above, post visit ordering of testing, clinical documentation with the electronic health  record, making appropriate referrals as documented, and communicating necessary information to the patient's healthcare team.   SaMagdalen SpatzNP 02/15/2022  1:07 PM

## 2022-02-17 DIAGNOSIS — G4733 Obstructive sleep apnea (adult) (pediatric): Secondary | ICD-10-CM | POA: Diagnosis not present

## 2022-02-24 ENCOUNTER — Other Ambulatory Visit (HOSPITAL_BASED_OUTPATIENT_CLINIC_OR_DEPARTMENT_OTHER): Payer: Self-pay

## 2022-02-24 MED ORDER — COVID-19 MRNA 2023-2024 VACCINE (COMIRNATY) 0.3 ML INJECTION
INTRAMUSCULAR | 0 refills | Status: DC
  Filled 2022-02-24: qty 0.3, 1d supply, fill #0

## 2022-03-16 DIAGNOSIS — G4733 Obstructive sleep apnea (adult) (pediatric): Secondary | ICD-10-CM | POA: Diagnosis not present

## 2022-03-20 DIAGNOSIS — G4733 Obstructive sleep apnea (adult) (pediatric): Secondary | ICD-10-CM | POA: Diagnosis not present

## 2022-03-22 ENCOUNTER — Other Ambulatory Visit: Payer: PPO

## 2022-03-23 ENCOUNTER — Encounter: Payer: Self-pay | Admitting: Family Medicine

## 2022-03-23 NOTE — Telephone Encounter (Signed)
Will need to call [employer] in the morning to clarify cpap orders for wife.

## 2022-03-24 ENCOUNTER — Encounter: Payer: Self-pay | Admitting: Family

## 2022-03-30 ENCOUNTER — Inpatient Hospital Stay: Payer: PPO | Admitting: Family

## 2022-03-30 ENCOUNTER — Inpatient Hospital Stay: Payer: PPO | Attending: Family

## 2022-03-30 ENCOUNTER — Encounter: Payer: Self-pay | Admitting: Family

## 2022-03-30 VITALS — BP 123/60 | HR 53 | Temp 97.8°F | Resp 17 | Ht 75.0 in | Wt 171.8 lb

## 2022-03-30 DIAGNOSIS — R002 Palpitations: Secondary | ICD-10-CM | POA: Insufficient documentation

## 2022-03-30 DIAGNOSIS — D649 Anemia, unspecified: Secondary | ICD-10-CM

## 2022-03-30 DIAGNOSIS — E538 Deficiency of other specified B group vitamins: Secondary | ICD-10-CM

## 2022-03-30 DIAGNOSIS — D539 Nutritional anemia, unspecified: Secondary | ICD-10-CM | POA: Insufficient documentation

## 2022-03-30 LAB — CBC WITH DIFFERENTIAL (CANCER CENTER ONLY)
Abs Immature Granulocytes: 0.01 10*3/uL (ref 0.00–0.07)
Basophils Absolute: 0.1 10*3/uL (ref 0.0–0.1)
Basophils Relative: 1 %
Eosinophils Absolute: 0.1 10*3/uL (ref 0.0–0.5)
Eosinophils Relative: 3 %
HCT: 35.8 % — ABNORMAL LOW (ref 39.0–52.0)
Hemoglobin: 11.8 g/dL — ABNORMAL LOW (ref 13.0–17.0)
Immature Granulocytes: 0 %
Lymphocytes Relative: 29 %
Lymphs Abs: 1.4 10*3/uL (ref 0.7–4.0)
MCH: 34.8 pg — ABNORMAL HIGH (ref 26.0–34.0)
MCHC: 33 g/dL (ref 30.0–36.0)
MCV: 105.6 fL — ABNORMAL HIGH (ref 80.0–100.0)
Monocytes Absolute: 0.5 10*3/uL (ref 0.1–1.0)
Monocytes Relative: 10 %
Neutro Abs: 2.6 10*3/uL (ref 1.7–7.7)
Neutrophils Relative %: 57 %
Platelet Count: 171 10*3/uL (ref 150–400)
RBC: 3.39 MIL/uL — ABNORMAL LOW (ref 4.22–5.81)
RDW: 13.5 % (ref 11.5–15.5)
WBC Count: 4.6 10*3/uL (ref 4.0–10.5)
nRBC: 0 % (ref 0.0–0.2)

## 2022-03-30 LAB — CMP (CANCER CENTER ONLY)
ALT: 17 U/L (ref 0–44)
AST: 25 U/L (ref 15–41)
Albumin: 4.1 g/dL (ref 3.5–5.0)
Alkaline Phosphatase: 70 U/L (ref 38–126)
Anion gap: 6 (ref 5–15)
BUN: 20 mg/dL (ref 8–23)
CO2: 31 mmol/L (ref 22–32)
Calcium: 10.1 mg/dL (ref 8.9–10.3)
Chloride: 102 mmol/L (ref 98–111)
Creatinine: 1.07 mg/dL (ref 0.61–1.24)
GFR, Estimated: >60 mL/min (ref >60)
Glucose, Bld: 108 mg/dL — ABNORMAL HIGH (ref 70–99)
Potassium: 4.3 mmol/L (ref 3.5–5.1)
Sodium: 139 mmol/L (ref 135–145)
Total Bilirubin: 0.5 mg/dL (ref 0.3–1.2)
Total Protein: 7.7 g/dL (ref 6.5–8.1)

## 2022-03-30 LAB — RETICULOCYTES
Immature Retic Fract: 9.5 % (ref 2.3–15.9)
RBC.: 3.37 MIL/uL — ABNORMAL LOW (ref 4.22–5.81)
Retic Count, Absolute: 40.8 10*3/uL (ref 19.0–186.0)
Retic Ct Pct: 1.2 % (ref 0.4–3.1)

## 2022-03-30 LAB — LACTATE DEHYDROGENASE: LDH: 170 U/L (ref 98–192)

## 2022-03-30 LAB — VITAMIN B12: Vitamin B-12: 1089 pg/mL — ABNORMAL HIGH (ref 180–914)

## 2022-03-30 NOTE — Progress Notes (Signed)
Hematology and Oncology Follow Up Visit  Troy Willis [id number] 05/07/1946 76 y.o. 03/30/2022   Principle Diagnosis:  Macrocytic anemia    Current Therapy:        B12 2500 mcg PO daily   Interim History:  Troy Willis is here today for follow-up. He is doing quite well and has no complaints at this time.  No fatigue noted.  No issue with infections. No fever, chills, n/v, cough, rash, dizziness, SOB, chest pain, abdominal pain or changes in bowel or bladder habits.  He notes occasional palpitations with atrial fib.  Numbness and tingling in his fingers and toes unchanged from baseline.  No syncope. He will occasionally trip and fall when his foot gets caught on roots in his yard. Thankfully he has not been injured.  Appetite and hydration are good. Weight is stable at 171 lbs.   ECOG Performance Status: 0 - Asymptomatic  Medications:  Allergies as of 03/30/2022       Reactions   Amiodarone Other (See Comments)   elevated liver enzymes   Atorvastatin Other (See Comments)   Nocturnal leg cramps   Black Pepper-turmeric Other (See Comments)   heartburn   Metoprolol Other (See Comments)   Hypotension        Medication List        Accurate as of March 30, 2022  9:25 AM. If you have any questions, ask your nurse or doctor.          Arexvy 120 MCG/0.5ML injection Generic drug: RSV vaccine recomb adjuvanted Inject into the muscle.   COVID-19 mRNA vaccine 2023-2024 Susp injection Commonly known as: COMIRNATY Inject into the muscle.   ezetimibe 10 MG tablet Commonly known as: ZETIA Take 1 tablet (10 mg total) by mouth daily.   famotidine 20 MG tablet Commonly known as: PEPCID Take 20 mg by mouth daily as needed for heartburn or indigestion.   Fluad Quadrivalent 0.5 ML injection Generic drug: influenza vaccine adjuvanted Inject into the muscle.   Flutter Devi Use as directed   furosemide 20 MG tablet Commonly known as: LASIX Take 1 tablet (20  mg total) by mouth daily.   hydrocortisone 2.5 % ointment Apply 1 application topically 2 (two) times daily as needed (irritation).   Melatonin 5 MG Caps Take 5 mg by mouth at bedtime.   Mucinex Maximum Strength 1200 MG Tb12 Generic drug: Guaifenesin Take 1,200 mg by mouth at bedtime. As needed   nitroGLYCERIN 0.4 MG SL tablet Commonly known as: NITROSTAT Place 1 tablet (0.4 mg total) under the tongue every 5 (five) minutes x 3 doses as needed for chest pain.   pravastatin 20 MG tablet Commonly known as: PRAVACHOL Take 1 tablet (20 mg total) by mouth 3 (three) times a week.   triamcinolone cream 0.1 % Commonly known as: KENALOG Apply 1 application topically daily as needed (rash).   Vitamin D3 50 MCG (2000 UT) Tabs Take 2,000 Units by mouth daily.   Xarelto 20 MG Tabs tablet Generic drug: rivaroxaban TAKE ONE TABLET BY MOUTH ONE TIME DAILY WITH SUPPER        Allergies:  Allergies  Allergen Reactions   Amiodarone Other (See Comments)    elevated liver enzymes   Atorvastatin Other (See Comments)    Nocturnal leg cramps   Black Pepper-Turmeric Other (See Comments)    heartburn   Metoprolol Other (See Comments)    Hypotension     Past Medical History, Surgical history, Social history, and Family History were  reviewed and updated.  Review of Systems: All other 10 point review of systems is negative.   Physical Exam:  height is '6\' 3"'$  (1.905 m) and weight is 171 lb 12.8 oz (77.9 kg). His oral temperature is 97.8 F (36.6 C). His blood pressure is 123/60 and his pulse is 53 (abnormal). His respiration is 17 and oxygen saturation is 100%.   Wt Readings from Last 3 Encounters:  03/30/22 171 lb 12.8 oz (77.9 kg)  02/15/22 165 lb 3.2 oz (74.9 kg)  01/04/22 165 lb 6.4 oz (75 kg)    Ocular: Sclerae unicteric, pupils equal, round and reactive to light Ear-nose-throat: Oropharynx clear, dentition fair Lymphatic: No cervical or supraclavicular adenopathy Lungs no  rales or rhonchi, good excursion bilaterally Heart regular rate and rhythm, no murmur appreciated Abd soft, nontender, positive bowel sounds MSK no focal spinal tenderness, no joint edema Neuro: non-focal, well-oriented, appropriate affect Breasts: Deferred   Lab Results  Component Value Date   WBC 4.6 03/30/2022   HGB 11.8 (L) 03/30/2022   HCT 35.8 (L) 03/30/2022   MCV 105.6 (H) 03/30/2022   PLT 171 03/30/2022   Lab Results  Component Value Date   FERRITIN 53 09/28/2021   IRON 187 (H) 09/28/2021   TIBC 325 09/28/2021   UIBC 138 09/28/2021   IRONPCTSAT 58 (H) 09/28/2021   Lab Results  Component Value Date   RETICCTPCT 1.2 03/30/2022   RBC 3.37 (L) 03/30/2022   No results found for: "KPAFRELGTCHN", "LAMBDASER", "KAPLAMBRATIO" No results found for: "IGGSERUM", "IGA", "IGMSERUM" No results found for: "TOTALPROTELP", "ALBUMINELP", "A1GS", "A2GS", "BETS", "BETA2SER", "GAMS", "MSPIKE", "SPEI"   Chemistry      Component Value Date/Time   NA 137 12/28/2021 0750   NA 139 07/12/2021 0821   K 4.0 12/28/2021 0750   CL 102 12/28/2021 0750   CO2 30 12/28/2021 0750   BUN 19 12/28/2021 0750   BUN 19 07/12/2021 0821   CREATININE 1.01 12/28/2021 0750   CREATININE 0.86 04/01/2016 0829      Component Value Date/Time   CALCIUM 9.9 12/28/2021 0750   ALKPHOS 64 12/28/2021 0750   AST 26 12/28/2021 0750   ALT 21 12/28/2021 0750   BILITOT 0.6 12/28/2021 0750       Impression and Plan: Troy Willis is a very pleasant 76 yo caucasian gentleman with mild anemia noted over the last 6 years.  B 12 pending. He will continue his daily supplement.  Follow-up in 6 months.   Lottie Dawson, NP 11/15/20239:25 AM

## 2022-04-01 ENCOUNTER — Ambulatory Visit (INDEPENDENT_AMBULATORY_CARE_PROVIDER_SITE_OTHER): Payer: PPO | Admitting: Pulmonary Disease

## 2022-04-01 DIAGNOSIS — J849 Interstitial pulmonary disease, unspecified: Secondary | ICD-10-CM

## 2022-04-01 LAB — PULMONARY FUNCTION TEST
DL/VA % pred: 78 %
DL/VA: 3.03 ml/min/mmHg/L
DLCO cor % pred: 56 %
DLCO cor: 15.69 ml/min/mmHg
DLCO unc % pred: 56 %
DLCO unc: 15.69 ml/min/mmHg
FEF 25-75 Post: 2.24 L/sec
FEF 25-75 Pre: 1.86 L/sec
FEF2575-%Change-Post: 20 %
FEF2575-%Pred-Post: 89 %
FEF2575-%Pred-Pre: 74 %
FEV1-%Change-Post: 3 %
FEV1-%Pred-Post: 74 %
FEV1-%Pred-Pre: 71 %
FEV1-Post: 2.62 L
FEV1-Pre: 2.53 L
FEV1FVC-%Change-Post: 1 %
FEV1FVC-%Pred-Pre: 103 %
FEV6-%Change-Post: 0 %
FEV6-%Pred-Post: 74 %
FEV6-%Pred-Pre: 73 %
FEV6-Post: 3.38 L
FEV6-Pre: 3.36 L
FEV6FVC-%Change-Post: 0 %
FEV6FVC-%Pred-Post: 106 %
FEV6FVC-%Pred-Pre: 105 %
FVC-%Change-Post: 2 %
FVC-%Pred-Post: 71 %
FVC-%Pred-Pre: 69 %
FVC-Post: 3.47 L
FVC-Pre: 3.38 L
Post FEV1/FVC ratio: 76 %
Post FEV6/FVC ratio: 100 %
Pre FEV1/FVC ratio: 75 %
Pre FEV6/FVC Ratio: 99 %
RV % pred: 77 %
RV: 2.18 L
TLC % pred: 71 %
TLC: 5.62 L

## 2022-04-01 NOTE — Progress Notes (Signed)
Full PFT completed today ? ?

## 2022-04-06 ENCOUNTER — Other Ambulatory Visit (HOSPITAL_BASED_OUTPATIENT_CLINIC_OR_DEPARTMENT_OTHER): Payer: Self-pay

## 2022-04-19 ENCOUNTER — Telehealth (INDEPENDENT_AMBULATORY_CARE_PROVIDER_SITE_OTHER): Payer: PPO | Admitting: Family Medicine

## 2022-04-19 VITALS — Ht 75.0 in | Wt 171.0 lb

## 2022-04-19 DIAGNOSIS — Z0000 Encounter for general adult medical examination without abnormal findings: Secondary | ICD-10-CM

## 2022-04-19 DIAGNOSIS — G4733 Obstructive sleep apnea (adult) (pediatric): Secondary | ICD-10-CM | POA: Diagnosis not present
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
## 2022-04-19 NOTE — Patient Instructions (Addendum)
I really enjoyed getting to talk with you today! I am available on Tuesdays and Thursdays for virtual visits if you have any questions or concerns, or if I can be of any further assistance.   CHECKLIST FROM ANNUAL WELLNESS VISIT:  -Follow up (please call to schedule if not scheduled after visit):  -Inperson visit with your Primary Doctor office:for normal follow up per your primary care provider -yearly for annual wellness visit with primary care office  Here is a list of your preventive care/health maintenance measures and the plan for each if any are due:  Health Maintenance  Topic Date Due   Medicare Annual Wellness (AWV)  04/20/2023   DTaP/Tdap/Td (3 - Td or Tdap) 03/19/2024   Pneumonia Vaccine 12+ Years old  Completed   INFLUENZA VACCINE  Completed   COVID-19 Vaccine  Completed   Hepatitis C Screening  Completed   Zoster Vaccines- Shingrix  Completed   HPV VACCINES  Aged Out   COLONOSCOPY (Pts 45-46yr Insurance coverage will need to be confirmed)  Discontinued    -See a dentist at least yearly  -Get your eyes checked and then per your eye specialist's recommendations  -Other issues addressed today:  -I have included below further information regarding a healthy whole foods based diet, physical activity guidelines for adults, stress management and opportunities for social connections. I hope you find this information useful.     NUTRITION - ideal diet: -eat real food: lots of colorful vegetables (half the plate) and fruits -5-7 servings of vegetables and fruits per day (fresh or steamed is best), exp. 2 servings of vegetables with lunch and dinner and 2 servings of fruit per day. Berries and greens such as kale and collards are  great choices.  -consume on a regular basis: whole grains (make sure first ingredient on label contains the word "whole"), fresh fruits, fish, nuts, seeds, healthy oils (such as olive oil, avocado oil, grape seed oil) -may eat small amounts of dairy and lean meat on occasion, but avoid processed meats such as ham, bacon, lunch meat, etc. -drink water -try to avoid fast food and pre-packaged foods, processed meat -most experts advise limiting sodium to < '2300mg'$  per day, should limit further is any chronic conditions such as high blood pressure, heart disease, diabetes, etc. The American Heart Association advised that < '1500mg'$  is is ideal -try to avoid foods that contain any ingredients with names you do not recognize  -try to avoid sugar/sweets (except for the natural sugar that occurs in fresh fruit) -try to avoid sweet drinks -try to avoid white rice, white bread, pasta (unless whole grain), white or yellow potatoes  FITNESS/EXERCISE: -it is great that you and you wife continue to exercise despite challenges! -if you wish to increase your physical activity, do so gradually and with the approval of your doctor -STOP and seek medical care immediately if you have any chest pain, chest discomfort or trouble breathing when starting or increasing exercise  -move and stretch your body, legs, feet and arms when sitting for long periods -Physical activity guidelines for optimal health in adults: -least 150 minutes per week of aerobic exercise (can talk, but not sing) once approved by your doctor, 20-30 minutes of sustained activity or two 10 minute episodes of sustained activity every day.  -resistance training at least 2 days per week if approved by your doctor -balance exercises 3+ days per week:   Stand somewhere where you have something sturdy to hold onto if you lose balance.  1) lift up on toes, start with 5x per day and work up to 20x   2) stand and lift on leg straight out to the side so that  foot is a few inches of the floor, start with 5x each side and work up to 20x each side   3) stand on one foot, start with 5 seconds each side and work up to 20 seconds on each side  If you need ideas or help with getting more active:  -Silver sneakers https://tools.silversneakers.com  -Walk with a Doc: http://stephens-thompson.biz/  -try to include resistance (weight lifting/strength building) and balance exercises twice per week: or the following link for ideas: ChessContest.fr  UpdateClothing.com.cy  STRESS MANAGEMENT - so important for health and well being -try meditating, or just sitting quietly with deep breathing while intentionally relaxing all parts of your body for 5 minutes daily  SOCIAL CONNECTIONS: -options in [place] if you wish to engage in more social and exercise related activities:  -Silver sneakers https://tools.silversneakers.com  -Walk with a Doc: http://stephens-thompson.biz/  -[place] Active Adults 50+ section on the [employer] (hiking clubs, book clubs, cards and games, chess, exercise classes, aquatic classes and much more) - see the website for details: https://www.[place].gov/departments/parks-recreation/active-adults50  -YouTube has lots of exercise videos for different ages and abilities as well  -[place] (a variety of indoor and outdoor inperson activities for adults). [phone]. [address]  -Virtual Online Classes (a variety of topics): see seniorplanet.org or call [phone]  -consider volunteering at a school, hospice center, church, senior center or elsewhere

## 2022-04-22 ENCOUNTER — Ambulatory Visit
Admission: RE | Admit: 2022-04-22 | Discharge: 2022-04-22 | Disposition: A | Discharge status: Home / Self Care | Payer: PPO | Source: Ambulatory Visit | Attending: Acute Care | Admitting: Acute Care

## 2022-04-22 DIAGNOSIS — I700 Atherosclerosis of aorta: Secondary | ICD-10-CM | POA: Diagnosis not present

## 2022-04-22 DIAGNOSIS — J84112 Idiopathic pulmonary fibrosis: Secondary | ICD-10-CM | POA: Diagnosis not present

## 2022-04-22 DIAGNOSIS — J849 Interstitial pulmonary disease, unspecified: Secondary | ICD-10-CM

## 2022-04-22 DIAGNOSIS — J439 Emphysema, unspecified: Secondary | ICD-10-CM | POA: Diagnosis not present

## 2022-04-22 DIAGNOSIS — J479 Bronchiectasis, uncomplicated: Secondary | ICD-10-CM | POA: Diagnosis not present

## 2022-05-04 ENCOUNTER — Other Ambulatory Visit (HOSPITAL_BASED_OUTPATIENT_CLINIC_OR_DEPARTMENT_OTHER): Payer: Self-pay

## 2022-05-04 ENCOUNTER — Other Ambulatory Visit: Payer: Self-pay | Admitting: Cardiology

## 2022-05-04 MED ORDER — EZETIMIBE 10 MG PO TABS
10.0000 mg | ORAL_TABLET | Freq: Every day | ORAL | 2 refills | Status: DC
  Filled 2022-05-04: qty 90, 90d supply, fill #0
  Filled 2022-06-23 – 2022-07-28 (×2): qty 90, 90d supply, fill #1

## 2022-05-11 ENCOUNTER — Other Ambulatory Visit (HOSPITAL_BASED_OUTPATIENT_CLINIC_OR_DEPARTMENT_OTHER): Payer: Self-pay

## 2022-05-19 ENCOUNTER — Encounter: Payer: Self-pay | Admitting: Family

## 2022-05-20 DIAGNOSIS — G4733 Obstructive sleep apnea (adult) (pediatric): Secondary | ICD-10-CM | POA: Diagnosis not present

## 2022-05-23 ENCOUNTER — Ambulatory Visit (INDEPENDENT_AMBULATORY_CARE_PROVIDER_SITE_OTHER): Payer: PPO | Admitting: Pulmonary Disease

## 2022-05-23 ENCOUNTER — Encounter: Payer: Self-pay | Admitting: Pulmonary Disease

## 2022-05-23 VITALS — BP 108/70 | HR 61 | Ht 75.0 in | Wt 167.0 lb

## 2022-05-23 DIAGNOSIS — J479 Bronchiectasis, uncomplicated: Secondary | ICD-10-CM | POA: Diagnosis not present

## 2022-05-23 DIAGNOSIS — I2721 Secondary pulmonary arterial hypertension: Secondary | ICD-10-CM | POA: Diagnosis not present

## 2022-05-23 DIAGNOSIS — J849 Interstitial pulmonary disease, unspecified: Secondary | ICD-10-CM | POA: Diagnosis not present

## 2022-05-23 DIAGNOSIS — G4733 Obstructive sleep apnea (adult) (pediatric): Secondary | ICD-10-CM

## 2022-05-23 NOTE — Addendum Note (Signed)
Addended by: Valerie Salts on: 05/23/2022 09:38 AM   Modules accepted: Orders

## 2022-05-23 NOTE — Patient Instructions (Signed)
Obstructive sleep apnea Keep using CPAP, we will refer you to a mask fitting class  ILD with bronchiectasis: Keep using guaifenesin as needed for chest congestion If you have increasing mucus production chest congestion or fevers or chills let me know right away Lung function test in November, we will order on the next visit Annual CT scan in November, we will order on the next visit  See me back in 6 months, sooner if needed

## 2022-05-23 NOTE — Progress Notes (Signed)
Subjective:    Patient ID: Troy Willis, male    DOB: 05/06/1946, 77 y.o.   MRN: [id number]  Synopsis: Evaluated in 2017 by [facility] for an ill-defined interstitial lung disease and bronchiectasis which is felt to be related to a prior severe lung infection. He also has mild emphysema on CT chest and pulmonary nodules.  He had a severe  Alpha 1 testing 02/2016 > M/MAugust 2017 CT chest showed findings suggestive of nonspecific and distal pneumonitis with cylindrical bronchiectasis, mucous plugging and mild paraseptal and centrilobular emphysema. Three-vessel coronary disease noted. Stable pulmonary nodules. He has known coronary artery disease and a history of a stent.   Upper endoscopy in 2013 showed evidence of esophagitis  HPI Chief Complaint  Patient presents with   Establish Care    Restablish card for bronchiectasis and ILD. Denies any changes in breathing or coughing habits.     Troy Willis has taken good care of him over the last few years.   He has had two respiratory infections: COVID x2.  He took molnupiravir once and paxlovid once.  He's taken Levaquin twice in the last few years.   He doesn't feel burdened by the chest congestion and mucus production. He uses mucinex at bedtime when he feels more chest congestion.  He doesn't use the flutter much. Most of his time has been dedicated to caring for his wife who is sick with non-hodgkin's lymphoma for the last 8-9 months. He uses the CPAP every night.  He wants to switch from a nose mask to a full face mask.  Past Medical History:  Diagnosis Date   Acquired dilation of ascending aorta and aortic root (HCC)    59m ascending aorta by echo 09/2020   Anemia    mild    Arthritis    hands, lower back   Bronchiectasis (HCC)    CAD (coronary artery disease), native coronary artery    a. 08/2015 Cath/PCI: LM nl, LAD 54mLCX small, nl, RCA 80p (4.0x16 Synergy DES),    Cancer (HCC)    skin on forehead   Cataracts,  bilateral    surgery   Depression    pt denies    Diverticulosis    Dyspnea    occasional    GERD (gastroesophageal reflux disease)    Hilar adenopathy    a. 08/2015 CT Chest: mild bilat hilar adenopathy and mild soft tissue prominence in inf right hilum, left hilum. Also areas of soft tissue and low-density material filling LLL bronchial airways->? mucous vs mass.   Hx of adenomatous colonic polyps 08/13/2015   Hyperlipidemia    MVP (mitral valve prolapse)    bileaflet with  moderate MR by echo 09/2020   [facility]   Nonspecific elevation of levels of transaminase or lactic acid dehydrogenase (LDH)    PMH of ; ? 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 CHA2DS2VASc = 2 -->Xarelto started 08/2015.   Pneumonia, bacterial 05/18/2012   Pulmonary HTN (HCC)    PASP 4868m on echo 08/2019>>Likely Group 3 related to bronchiectasis>>repeat echo 09/2020 with PASP 58m67m  Pulmonary nodules    a. 08/2015 CT Chest: reticulonodular opacities in RUL - largest 5mm 70mikely 2/2 inflammatory process.   [facility]       Review of Systems  Constitutional:  Negative for chills, fatigue and fever.  HENT:  Negative for postnasal  drip, rhinorrhea and sinus pressure.   Respiratory:  Negative for cough, shortness of breath and wheezing.   Cardiovascular:  Negative for chest pain, palpitations and leg swelling.       Objective:   Physical Exam Vitals:   05/23/22 0847  BP: 108/70  Pulse: 61  SpO2: 98%  Weight: 167 lb (75.8 kg)  Height: '6\' 3"'$  (1.905 m)  RA  Gen: well appearing HENT: OP clear, neck supple PULM: few crackles R base B, normal effort  CV: RRR, no mgr GI: BS+, soft, nontender Derm: no cyanosis or rash Psyche: normal mood and affect   Chest imaging: August 2018 high-resolution CT scanning of the chest showed some bibasilar scarring, mucoid plugging and airway thickening and  bronchiectasis suspicious for nonspecific interstitial pneumonitis.  Pulmonary nodules are stable compared to prior exams December 2023 high-resolution CT scan of the chest images independently reviewed showing traction bronchiectasis, mild peripheral base interlobular septal thickening, no honeycombing, some mucous plugging, in general fibrotic changes are worse in the periphery and bases, some progression compared to older studies (2017).  Radiologist raises some concern for aspiration given bandlike atelectasis and mucous plugging in bases, lobular air trapping, mild underlying emphysema.  PFT November 2017  FVC 4.0 L 76% predicted, no airflow obstruction, total lung capacity 5.91 L 73% predicted, DLCO 22.12 Oct 2016 ratio normal, FEV1 3.04 L 79% predicted, FVC 4.25 L 81% protected, total lung capacity 6.30 L 78% productive, DLCO 17.90 45% predicted November 2018: Forced vital capacity 4.01 L 79% predicted, total lung capacity 6.30 L 80% predicted, DLCO 17.46 mL 46% predicted November 2023 full PFT FVC 3.38 L, total lung capacity 5.62 L (71% pred), DLCO 15.7 56% predicted   6MW: 09/2016 660M, O2 saturation 99% 03/2017 600M, O2 saturation 100%  Sleep study: January 2023 AHI 50, SpO2 low 76% August 2023 overnight oximetry test on CPAP with room air: O2 saturation less than 88% for only 36 seconds  Labs: CBC    Component Value Date/Time   WBC 4.6 03/30/2022 0903   WBC 5.3 09/20/2021 1613   RBC 3.37 (L) 03/30/2022 0904   RBC 3.39 (L) 03/30/2022 0903   HGB 11.8 (L) 03/30/2022 0903   HGB 10.9 (L) 05/19/2021 0828   HCT 35.8 (L) 03/30/2022 0903   HCT 31.4 (L) 05/19/2021 0828   PLT 171 03/30/2022 0903   PLT 150 05/19/2021 0828   MCV 105.6 (H) 03/30/2022 0903   MCV 100 (H) 05/19/2021 0828   MCH 34.8 (H) 03/30/2022 0903   MCHC 33.0 03/30/2022 0903   RDW 13.5 03/30/2022 0903   RDW 12.0 05/19/2021 0828   LYMPHSABS 1.4 03/30/2022 0903   MONOABS 0.5 03/30/2022 0903   EOSABS 0.1 03/30/2022  0903   BASOSABS 0.1 03/30/2022 0903        Assessment & Plan:    ILD (interstitial lung disease) ([place])  OSA on CPAP  Bronchiectasis without complication (HCC)  PAH (pulmonary artery hypertension) ([place])  Discussion: This has been a stable interval for [name].  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  In general I believe this is due to aging, some level of silent aspiration.  Though interestingly he does have a fairly significant family history of pulmonary fibrosis.  We talked about antifibrotic options for progressive lung disease.  Because his progression is so slow and has so little impact on his quality of life it is not clear to me or  him at this time that a trial of antifibrotic therapy is worth the risk of side effect.  However, were keeping an open mind about this and I am providing him with information about antifibrotic therapy today.  Plan: Obstructive sleep apnea Keep using CPAP, we will refer you to a mask fitting class  ILD with bronchiectasis: Keep using guaifenesin as needed for chest congestion If you have increasing mucus production chest congestion or fevers or chills let me know right away Lung function test in November, we will order on the next visit Annual CT scan in November, we will order on the next visit  See me back in 6 months, sooner if needed   Current Outpatient Medications:    Cholecalciferol (VITAMIN D3) 2000 UNITS TABS, Take 2,000 Units by mouth daily. , Disp: , Rfl:    COVID-19 mRNA vaccine 2023-2024 (COMIRNATY) SUSP injection, Inject into the muscle., Disp: 0.3 mL, Rfl: 0   ezetimibe (ZETIA) 10 MG tablet, Take 1 tablet (10 mg total) by mouth daily., Disp: 90 tablet, Rfl: 2   famotidine (PEPCID) 20 MG tablet, Take 20 mg by mouth daily as needed for heartburn or indigestion., Disp: , Rfl:    furosemide (LASIX) 20 MG tablet, Take 1 tablet (20 mg total) by mouth daily., Disp: 90 tablet, Rfl:  3   hydrocortisone 2.5 % ointment, Apply 1 application topically 2 (two) times daily as needed (irritation). , Disp: , Rfl:    influenza vaccine adjuvanted (FLUAD QUADRIVALENT) 0.5 ML injection, Inject into the muscle., Disp: 0.5 mL, Rfl: 0   Melatonin 5 MG CAPS, Take 5 mg by mouth at bedtime., Disp: , Rfl:    nitroGLYCERIN (NITROSTAT) 0.4 MG SL tablet, Place 1 tablet (0.4 mg total) under the tongue every 5 (five) minutes x 3 doses as needed for chest pain., Disp: 25 tablet, Rfl: 3   pravastatin (PRAVACHOL) 20 MG tablet, Take 1 tablet (20 mg total) by mouth 3 (three) times a week., Disp: 30 tablet, Rfl: 4   Respiratory Therapy Supplies (FLUTTER) DEVI, Use as directed, Disp: 1 each, Rfl: 0   rivaroxaban (XARELTO) 20 MG TABS tablet, TAKE ONE TABLET BY MOUTH ONE TIME DAILY WITH SUPPER, Disp: 90 tablet, Rfl: 2   RSV vaccine recomb adjuvanted (AREXVY) 120 MCG/0.5ML injection, Inject into the muscle., Disp: 0.5 mL, Rfl: 0   triamcinolone cream (KENALOG) 0.1 %, Apply 1 application topically daily as needed (rash). , Disp: , Rfl:  No current facility-administered medications for this visit.  Facility-Administered Medications Ordered in Other Visits:    gemcitabine (GEMZAR) chemo syringe for bladder instillation 2,000 mg, 2,000 mg, Bladder Instillation, Once, Irine Seal, MD

## 2022-06-08 ENCOUNTER — Ambulatory Visit (HOSPITAL_BASED_OUTPATIENT_CLINIC_OR_DEPARTMENT_OTHER): Payer: PPO | Attending: Pulmonary Disease | Admitting: Radiology

## 2022-06-08 DIAGNOSIS — G4733 Obstructive sleep apnea (adult) (pediatric): Secondary | ICD-10-CM

## 2022-06-13 DIAGNOSIS — N200 Calculus of kidney: Secondary | ICD-10-CM | POA: Diagnosis not present

## 2022-06-20 DIAGNOSIS — G4733 Obstructive sleep apnea (adult) (pediatric): Secondary | ICD-10-CM | POA: Diagnosis not present

## 2022-06-23 ENCOUNTER — Other Ambulatory Visit (HOSPITAL_BASED_OUTPATIENT_CLINIC_OR_DEPARTMENT_OTHER): Payer: Self-pay

## 2022-06-23 ENCOUNTER — Other Ambulatory Visit (HOSPITAL_BASED_OUTPATIENT_CLINIC_OR_DEPARTMENT_OTHER): Payer: Self-pay | Admitting: Family

## 2022-06-23 MED ORDER — PRAVASTATIN SODIUM 20 MG PO TABS
20.0000 mg | ORAL_TABLET | ORAL | 4 refills | Status: DC
  Filled 2022-06-23: qty 30, 70d supply, fill #0
  Filled 2022-07-28: qty 30, 70d supply, fill #1

## 2022-06-23 NOTE — Telephone Encounter (Signed)
Rx request sent to pharmacy.  

## 2022-06-23 NOTE — Telephone Encounter (Signed)
Patient of Dr. Turner. Please review for refill. Thank you!  

## 2022-06-27 DIAGNOSIS — H26492 Other secondary cataract, left eye: Secondary | ICD-10-CM | POA: Diagnosis not present

## 2022-06-27 DIAGNOSIS — Z961 Presence of intraocular lens: Secondary | ICD-10-CM | POA: Diagnosis not present

## 2022-06-27 DIAGNOSIS — H524 Presbyopia: Secondary | ICD-10-CM | POA: Diagnosis not present

## 2022-06-27 DIAGNOSIS — H40013 Open angle with borderline findings, low risk, bilateral: Secondary | ICD-10-CM | POA: Diagnosis not present

## 2022-07-05 ENCOUNTER — Encounter: Payer: Self-pay | Admitting: Family Medicine

## 2022-07-05 ENCOUNTER — Ambulatory Visit: Payer: PPO | Attending: Cardiology | Admitting: Cardiology

## 2022-07-05 ENCOUNTER — Encounter: Payer: Self-pay | Admitting: Cardiology

## 2022-07-05 VITALS — BP 102/58 | HR 47 | Ht 75.0 in | Wt 170.0 lb

## 2022-07-05 DIAGNOSIS — I340 Nonrheumatic mitral (valve) insufficiency: Secondary | ICD-10-CM | POA: Diagnosis not present

## 2022-07-05 DIAGNOSIS — I25118 Atherosclerotic heart disease of native coronary artery with other forms of angina pectoris: Secondary | ICD-10-CM

## 2022-07-05 DIAGNOSIS — I272 Pulmonary hypertension, unspecified: Secondary | ICD-10-CM | POA: Diagnosis not present

## 2022-07-05 DIAGNOSIS — E785 Hyperlipidemia, unspecified: Secondary | ICD-10-CM

## 2022-07-05 DIAGNOSIS — I4821 Permanent atrial fibrillation: Secondary | ICD-10-CM | POA: Diagnosis not present

## 2022-07-05 NOTE — Addendum Note (Signed)
Addended by: Joni Reining on: 07/05/2022 11:01 AM   Modules accepted: Orders

## 2022-07-05 NOTE — Patient Instructions (Signed)
Medication Instructions:  Your physician recommends that you continue on your current medications as directed. Please refer to the Current Medication list given to you today.  *If you need a refill on your cardiac medications before your next appointment, please call your pharmacy*   Lab Work: None.  If you have labs (blood work) drawn today and your tests are completely normal, you will receive your results only by: [place] (if you have MyChart) OR A paper copy in the mail If you have any lab test that is abnormal or we need to change your treatment, we will call you to review the results.   Testing/Procedures: Your physician has requested that you have an echocardiogram. Echocardiography is a painless test that uses sound waves to create images of your heart. It provides your doctor with information about the size and shape of your heart and how well your heart's chambers and valves are working. This procedure takes approximately one hour. There are no restrictions for this procedure. Please do NOT wear cologne, perfume, aftershave, or lotions (deodorant is allowed). Please arrive 15 minutes prior to your appointment time.    Follow-Up:    Your next appointment:   1 year(s)  Provider:   Fransico Him, MD
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
## 2022-07-07 DIAGNOSIS — L570 Actinic keratosis: Secondary | ICD-10-CM | POA: Diagnosis not present

## 2022-07-07 DIAGNOSIS — D1801 Hemangioma of skin and subcutaneous tissue: Secondary | ICD-10-CM | POA: Diagnosis not present

## 2022-07-07 DIAGNOSIS — Z85828 Personal history of other malignant neoplasm of skin: Secondary | ICD-10-CM | POA: Diagnosis not present

## 2022-07-07 DIAGNOSIS — I788 Other diseases of capillaries: Secondary | ICD-10-CM | POA: Diagnosis not present

## 2022-07-07 DIAGNOSIS — L814 Other melanin hyperpigmentation: Secondary | ICD-10-CM | POA: Diagnosis not present

## 2022-07-07 DIAGNOSIS — L821 Other seborrheic keratosis: Secondary | ICD-10-CM | POA: Diagnosis not present

## 2022-07-19 DIAGNOSIS — G4733 Obstructive sleep apnea (adult) (pediatric): Secondary | ICD-10-CM | POA: Diagnosis not present

## 2022-07-22 ENCOUNTER — Encounter: Payer: Self-pay | Admitting: Family Medicine

## 2022-07-22 ENCOUNTER — Telehealth: Payer: Self-pay | Admitting: Family Medicine

## 2022-07-22 DIAGNOSIS — S91309A Unspecified open wound, unspecified foot, initial encounter: Secondary | ICD-10-CM

## 2022-07-22 NOTE — Telephone Encounter (Signed)
Pt said he stepped on a clean nail last night and made a small puncture wound. He said it is not bleeding or anything. Pt could not remember the last time he had his tetanus shot and wanted to have someone check his records to see if he is due for one. He said he will back this way in a couple of hours if he needs to come get one. Please call him to advise.

## 2022-07-22 NOTE — Telephone Encounter (Signed)
Last was 03/19/2014- please advise on if he needs to come in for updated vaccine.

## 2022-07-28 ENCOUNTER — Other Ambulatory Visit (HOSPITAL_BASED_OUTPATIENT_CLINIC_OR_DEPARTMENT_OTHER): Payer: Self-pay

## 2022-07-28 ENCOUNTER — Other Ambulatory Visit: Payer: Self-pay

## 2022-08-02 ENCOUNTER — Ambulatory Visit (HOSPITAL_COMMUNITY): Payer: PPO | Attending: Cardiology

## 2022-08-02 DIAGNOSIS — I272 Pulmonary hypertension, unspecified: Secondary | ICD-10-CM | POA: Diagnosis not present

## 2022-08-02 LAB — ECHOCARDIOGRAM COMPLETE
Area-P 1/2: 3.98 cm2
MV M vel: 5.04 m/s
MV Peak grad: 101.6 mmHg
P 1/2 time: 149 msec
Radius: 0.7 cm
S' Lateral: 2.4 cm

## 2022-08-03 ENCOUNTER — Encounter: Payer: Self-pay | Admitting: Cardiology

## 2022-08-08 ENCOUNTER — Telehealth: Payer: Self-pay

## 2022-08-08 DIAGNOSIS — I080 Rheumatic disorders of both mitral and aortic valves: Secondary | ICD-10-CM

## 2022-08-08 NOTE — Telephone Encounter (Signed)
Discussed with patient that echo shows normal heart function with mildly thickened heart muscle due to high blood pressure,  per Dr. Radford Pax echo appears stable from 2023. Patient verbalizes understanding that upper chambers of heart are severely enlarged again likely due to leaky mitral valve as well as high blood pressure. There is mitral valve prolapse with mild to moderate leakiness of the mitral valve and moderate leakiness of the tricuspid valve.  Orders placed for echo in 1 year for mitral regurgitation.

## 2022-08-08 NOTE — Telephone Encounter (Signed)
-----   Message from Sueanne Margarita, MD sent at 08/03/2022  1:59 PM EDT ----- Echo shows normal heart function with mildly thickened heart muscle due to high blood pressure.  The upper chambers of the heart called the left atrium and right atrium are severely enlarged again likely due to leaky mitral valve as well as high blood pressure.  There is mitral valve prolapse with mild to moderate leakiness of the mitral valve and moderate leakiness of the tricuspid valve.  Echo appears stable from 2023.  Please repeat echo in 1 year for mitral regurgitation

## 2022-08-09 ENCOUNTER — Encounter (HOSPITAL_COMMUNITY): Payer: Self-pay

## 2022-08-19 DIAGNOSIS — G4733 Obstructive sleep apnea (adult) (pediatric): Secondary | ICD-10-CM | POA: Diagnosis not present

## 2022-09-05 ENCOUNTER — Telehealth: Payer: Self-pay | Admitting: Cardiology

## 2022-09-05 DIAGNOSIS — E785 Hyperlipidemia, unspecified: Secondary | ICD-10-CM

## 2022-09-05 NOTE — Telephone Encounter (Signed)
Pt c/o medication issue:  1. Name of Medication:  pravastatin (PRAVACHOL) 20 MG tablet   ezetimibe (ZETIA) 10 MG table  2. How are you currently taking this medication (dosage and times per day)?  Patient takes both as prescribed  3. Are you having a reaction (difficulty breathing--STAT)?   4. What is your medication issue?   Patient states these medication normally cause night cramps occasionally, but they have become worse over the past few weeks.

## 2022-09-05 NOTE — Telephone Encounter (Signed)
Returned patient's call regarding his symptoms on pravachol and zetia. No answer, left detailed message per DPR asking patient to call the office.

## 2022-09-06 NOTE — Telephone Encounter (Signed)
Pt returning call, pt states he has stopped taking his statin for the time being due to leg cramps.

## 2022-09-07 NOTE — Addendum Note (Signed)
Addended by: Luellen Pucker on: 09/07/2022 01:47 PM   Modules accepted: Orders

## 2022-09-07 NOTE — Telephone Encounter (Signed)
Called patient to follow up and see if he has had any improved leg cramps since he stopped taking his pravastatin. Patient states his leg cramps are gone since he stopped taking pravastatin on 08/27/22. Reviewed medications with patient, he states he also stopped taking lasix 20 mg daily on 08/27/22 as he was having no leg swelling. He states he is still not having leg swelling and denies any new SOB. Forwarded to Dr. Mayford Knife.

## 2022-09-07 NOTE — Telephone Encounter (Signed)
Called patient to discuss Dr. Norris Cross recommendation for lipid clinic. Explained lipid clinic can help patient find affordable cholesterol lowering med that minimizes side effects. Patient verbalizes understanding and agrees to plan.

## 2022-09-09 ENCOUNTER — Ambulatory Visit: Payer: PPO | Attending: Cardiology | Admitting: Pharmacist

## 2022-09-09 ENCOUNTER — Telehealth: Payer: Self-pay | Admitting: Pharmacist

## 2022-09-09 DIAGNOSIS — E7800 Pure hypercholesterolemia, unspecified: Secondary | ICD-10-CM

## 2022-09-09 NOTE — Telephone Encounter (Signed)
Will need updated labs off of pravastatin for insurance. PA started Key: [id number] Called pt and he will go to [facility] on Monday for labs Orders placed

## 2022-09-09 NOTE — Assessment & Plan Note (Signed)
Assessment: Off of pravastatin, pt LDL-C will be above goal Cramps have gone away after stopping pravastatin and furosemide We discussed that cramps could have been from furosemide and patient is aware that they could have been from many different things Discussed all medication options in detail. Reviewed options for lowering LDL cholesterol, including low dose rosuvastatin, PCSK-9 inhibitors, bempedoic acid and inclisiran.  Discussed mechanisms of action, dosing, side effects and potential decreases in LDL cholesterol.  Reviewed cost.  Plan: Will submit PA for Nexlizet as pt would like to try this first. He is aware it is a tier 4 I will call patient once I hear back from insurance

## 2022-09-09 NOTE — Patient Instructions (Addendum)
We will submit a prior authorization for Nexlizet I will call you when its approved When you start Nexlizet, STOP Zetia Please call me with any questions [phone]

## 2022-09-09 NOTE — Progress Notes (Addendum)
Patient ID: Troy Willis                 DOB: December 22, 1945                    MRN: [id number]      HPI: Troy Willis is a 77 y.o. male patient referred to lipid clinic by Dr. Mayford Knife. 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  LDL-C on pravastatin and ezetimibe was 64 in April 2023.  However, patient called office last week reporting increased leg cramps.  He held pravastatin and ezetimibe and these improved.  At the same time patient also stopped furosemide as he did not have any swelling.  Patient presents today to lipid clinic. Patient is a very sweet gentleman who asks several very good questions about his medications and atherosclerosis. He states that he has been on pravastatin and furosemide for a few years. Would like to try non-statin to rule out if cramps are from something else.  Reviewed options for lowering LDL cholesterol, including low dose rosuvastatin, PCSK-9 inhibitors, bempedoic acid and inclisiran.  Discussed mechanisms of action, dosing, side effects and potential decreases in LDL cholesterol.  Reviewed cost.  Current Medications: Zetia 10mg  daily Intolerances: Rosuvastatin 20 mg, 40 mg, atorvastatin 80 mg, pravastatin 20 mg 3 times a week (leg cramps) Risk Factors: MI,  age (CVA removed as this must have been a documentation error previous) LDL-C goal: Less than 70 ApoB goal: Less than 70 Non-HDL-C goal: Less than 80  Diet: not discussed in detail today  Exercise: use to walk several miles a day, but currently his wife is sick and he is her caretaker                                                                                                                                                                    Family History: The patient's family history includes Arthritis in his sister, sister, and  sister; COPD in his father; Cancer in his mother; Colon cancer (age of onset: 32) in his sister; Coronary artery disease in an other family member; Heart attack in his brother and father; Heart disease in his brother and sister; Pulmonary fibrosis in his sister and sister; Suicidality in an other family member. There is no history of Ulcers, Esophageal cancer, Pancreatic cancer, or Stomach cancer.   Social History:  Social History   Socioeconomic History   Marital status: Married    Spouse name: Not on file   Number of children: 3   Years of education: Not on file   Highest  education level: Not on file  Occupational History   Occupation: Art gallery manager  Tobacco Use   Smoking status: Never   Smokeless tobacco: Former    Types: Chew    Quit date: 05/16/1978   Tobacco comments:    occasional cigar in past; not regular smoker  Vaping Use   Vaping Use: Never used  Substance and Sexual Activity   Alcohol use: Yes    Comment: 5 beers per week    Drug use: No   Sexual activity: Not Currently  Other Topics Concern   Not on file  Social History Narrative   Retired Art gallery manager, 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    Second home [facility]    daily caffeine    Never smoker, 7 beers a week no tobacco or drug use   Social Determinants of Health   Financial Resource Strain: Low Risk  (04/05/2021)   Overall Financial Resource Strain (CARDIA)    Difficulty of Paying Living Expenses: Not hard at all  Food Insecurity: No Food Insecurity (04/05/2021)   Hunger Vital Sign    Worried About Running Out of Food in the Last Year: Never true    Ran Out of Food in the Last Year: Never true  Transportation Needs: No Transportation Needs (04/05/2021)   PRAPARE - Administrator, Civil Service (Medical): No    Lack of Transportation (Non-Medical): No  Physical Activity: Insufficiently Active (04/05/2021)   Exercise Vital Sign    Days of Exercise per Week: 3 days    Minutes of Exercise per Session: 30 min   Stress: No Stress Concern Present (04/05/2021)   [employer] of Occupational Health - Occupational Stress Questionnaire    Feeling of Stress : Not at all  Social Connections: Moderately Integrated (04/05/2021)   Social Connection and Isolation Panel [NHANES]    Frequency of Communication with Friends and Family: More than three times a week    Frequency of Social Gatherings with Friends and Family: More than three times a week    Attends Religious Services: More than 4 times per year    Active Member of Clubs or Organizations: No    Attends Banker Meetings: Never    Marital Status: Married  Catering manager Violence: Not At Risk (04/05/2021)   Humiliation, Afraid, Rape, and Kick questionnaire    Fear of Current or Ex-Partner: No    Emotionally Abused: No    Physically Abused: No    Sexually Abused: No     Labs: Lipid Panel     Component Value Date/Time   CHOL 137 09/10/2021 0910   TRIG 39 09/10/2021 0910   HDL 63 09/10/2021 0910   CHOLHDL 2.2 09/10/2021 0910   CHOLHDL 2 02/07/2020 0852   VLDL 12.0 02/07/2020 0852   LDLCALC 64 09/10/2021 0910   LDLDIRECT 102.4 11/22/2011 1144   LABVLDL 10 09/10/2021 0910    Past Medical History:  Diagnosis Date   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 08/2015 Cath/PCI: LM nl, LAD 65m, LCX small, nl, RCA 80p (4.0x16 Synergy DES),    Cancer (HCC)    skin on forehead   Cataracts, bilateral    surgery   Depression    pt denies  Diverticulosis    Dyspnea    occasional    GERD (gastroesophageal reflux disease)    Hilar adenopathy    a. 08/2015 CT Chest: mild bilat hilar adenopathy and mild soft tissue prominence in inf right hilum, left hilum. Also areas of soft tissue and low-density material filling LLL bronchial  airways->? mucous vs mass.   Hx of adenomatous colonic polyps 08/13/2015   Hyperlipidemia    MVP (mitral valve prolapse)    Mild to moderate MR by echo 07/2022   Myocardial infarction [facility])    Nonspecific elevation of levels of transaminase or lactic acid dehydrogenase (LDH)    PMH of ; ? 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 CHA2DS2VASc = 2 -->Xarelto started 08/2015.   Pneumonia, bacterial 05/18/2012   Pulmonary HTN (HCC)    PASP on echo 08/2019>>Likely Group 3 related to bronchiectasis>>repeat echo 09/2020 with PASP   Pulmonary nodules    a. 08/2015 CT Chest: reticulonodular opacities in RUL - largest 5mm - likely 2/2 inflammatory process.   Tricuspid regurgitation     Current Outpatient Medications on File Prior to Visit  Medication Sig Dispense Refill   Cholecalciferol (VITAMIN D3) 2000 UNITS TABS Take 2,000 Units by mouth daily.      COVID-19 mRNA vaccine 2023-2024 (COMIRNATY) SUSP injection Inject into the muscle. 0.3 mL 0   ezetimibe (ZETIA) 10 MG tablet Take 1 tablet (10 mg total) by mouth daily. 90 tablet 2   famotidine (PEPCID) 20 MG tablet Take 20 mg by mouth daily as needed for heartburn or indigestion.     furosemide (LASIX) 20 MG tablet Take 1 tablet (20 mg total) by mouth daily. 90 tablet 3   hydrocortisone 2.5 % ointment Apply 1 application topically 2 (two) times daily as needed (irritation).      influenza vaccine adjuvanted (FLUAD QUADRIVALENT) 0.5 ML injection Inject into the muscle. 0.5 mL 0   Melatonin 5 MG CAPS Take 5 mg by mouth at bedtime.     nitroGLYCERIN (NITROSTAT) 0.4 MG SL tablet Place 1 tablet (0.4 mg total) under the tongue every 5 (five) minutes x 3 doses as needed for chest pain. 25 tablet 3   pravastatin (PRAVACHOL) 20 MG tablet Take 1 tablet (20 mg total) by mouth 3 (three) times a week. 30 tablet 4   Respiratory Therapy Supplies (FLUTTER) DEVI Use as directed 1 each 0    rivaroxaban (XARELTO) 20 MG TABS tablet TAKE ONE TABLET BY MOUTH ONE TIME DAILY WITH SUPPER 90 tablet 2   RSV vaccine recomb adjuvanted (AREXVY) 120 MCG/0.5ML injection Inject into the muscle. 0.5 mL 0   triamcinolone cream (KENALOG) 0.1 % Apply 1 application topically daily as needed (rash).      Current Facility-Administered Medications on File Prior to Visit  Medication Dose Route Frequency Provider Last Rate Last Admin   gemcitabine (GEMZAR) chemo syringe for bladder instillation 2,000 mg  2,000 mg Bladder Instillation Once Bjorn Pippin, MD        Allergies  Allergen Reactions   Amiodarone Other (See Comments)    elevated liver enzymes   Atorvastatin Other (See Comments)    Nocturnal leg cramps   Black Pepper-Turmeric Other (See Comments)    heartburn   Metoprolol Other (See Comments)    Hypotension     Assessment/Plan:  1. Hyperlipidemia -  Hyperlipidemia Assessment: Off of pravastatin, pt LDL-C will be above goal Cramps have gone away  after stopping pravastatin and furosemide We discussed that cramps could have been from furosemide and patient is aware that they could have been from many different things Discussed all medication options in detail. Reviewed options for lowering LDL cholesterol, including low dose rosuvastatin, PCSK-9 inhibitors, bempedoic acid and inclisiran.  Discussed mechanisms of action, dosing, side effects and potential decreases in LDL cholesterol.  Reviewed cost.  Plan: Will submit PA for Nexlizet as pt would like to try this first. He is aware it is a tier 4 I will call patient once I hear back from insurance    Thank you,  Olene Floss, Pharm.D, BCPS, CPP [facility] [address] [place], [place] [address]  Phone: [phone]; Fax: [phone]

## 2022-09-12 NOTE — Telephone Encounter (Signed)
I have received the following message   "Hello Troy Willis, I hope this message finds you well!   Over the last few weeks I feel I am more "short of breath" than usual.  It has been pollen season and that may be the cause...Marland Kitchenor it may not.   Do you think it would be useful to schedule a visit for a PFT or six minute walk or other evaluation tools or for you to make a baseline comparison of my physical characteristics?   Not gasping...just asking.    [name] please advise

## 2022-09-14 ENCOUNTER — Encounter: Payer: Self-pay | Admitting: Cardiology

## 2022-09-14 DIAGNOSIS — I4821 Permanent atrial fibrillation: Secondary | ICD-10-CM

## 2022-09-15 ENCOUNTER — Encounter: Payer: Self-pay | Admitting: Pharmacist

## 2022-09-15 ENCOUNTER — Other Ambulatory Visit (HOSPITAL_BASED_OUTPATIENT_CLINIC_OR_DEPARTMENT_OTHER): Payer: Self-pay

## 2022-09-15 MED ORDER — RIVAROXABAN 20 MG PO TABS
20.0000 mg | ORAL_TABLET | Freq: Every day | ORAL | 1 refills | Status: DC
  Filled 2022-09-15: qty 90, 90d supply, fill #0
  Filled 2022-12-08: qty 90, 90d supply, fill #1

## 2022-09-15 NOTE — Telephone Encounter (Signed)
I have have made Troy Willis an appointment with Maralyn Sago 09/15/21.

## 2022-09-15 NOTE — Telephone Encounter (Signed)
Prescription refill request for Xarelto received.  Indication: Afib  Last office visit: 07/05/22 Mayford Knife)  Weight: 77.1kg Age: 77 Scr: 1.07 (05/30/21)  CrCl: 67.34ml/min  Appropriate dose. Refill sent.

## 2022-09-16 ENCOUNTER — Ambulatory Visit: Payer: PPO | Admitting: Acute Care

## 2022-09-16 ENCOUNTER — Other Ambulatory Visit (HOSPITAL_BASED_OUTPATIENT_CLINIC_OR_DEPARTMENT_OTHER): Payer: Self-pay

## 2022-09-16 ENCOUNTER — Ambulatory Visit (INDEPENDENT_AMBULATORY_CARE_PROVIDER_SITE_OTHER): Payer: PPO

## 2022-09-16 ENCOUNTER — Encounter: Payer: Self-pay | Admitting: Acute Care

## 2022-09-16 ENCOUNTER — Telehealth: Payer: Self-pay | Admitting: *Deleted

## 2022-09-16 VITALS — BP 110/50 | HR 75 | Temp 97.8°F | Ht 75.0 in | Wt 160.6 lb

## 2022-09-16 DIAGNOSIS — G4733 Obstructive sleep apnea (adult) (pediatric): Secondary | ICD-10-CM | POA: Diagnosis not present

## 2022-09-16 DIAGNOSIS — I2721 Secondary pulmonary arterial hypertension: Secondary | ICD-10-CM | POA: Diagnosis not present

## 2022-09-16 DIAGNOSIS — R5383 Other fatigue: Secondary | ICD-10-CM

## 2022-09-16 DIAGNOSIS — J849 Interstitial pulmonary disease, unspecified: Secondary | ICD-10-CM | POA: Diagnosis not present

## 2022-09-16 MED ORDER — PREDNISONE 10 MG PO TABS
ORAL_TABLET | ORAL | 0 refills | Status: AC
Start: 2022-09-16 — End: 2022-09-24
  Filled 2022-09-16: qty 20, 8d supply, fill #0

## 2022-09-16 NOTE — Progress Notes (Signed)
History of Present Illness Troy Willis is a 77 y.o. 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 Marland KitchenHe is followed by Dr. Tonia Brooms / Dr. Kendrick Fries.  Pt. Had Covid 09/2020. Full PFT's were done 03/23/2021 which showed Moderately reduced DLCO and mild restriction , but drop from 63% to 53% in 2.5 years. His HRCT did not show progression.  It is possible that some of this could be due to deconditioning and Covid virus. He has been taking care of his wife who has been undergoing chemotherapy treatments , and has had less time to exercise. [name] His last echo actually showed a decrease in his pulmonary artery hypertension, and his last HRCT showed stable ILD, however both echo and CT were done post COVID.  He states his bronchiectasis has been very stable, he uses his Mucinex and flutter valve as needed. He is one of the most compliant patient's I have. Very self aware and works very hard to remain in shape.     09/16/2022 Pt. Presents for follow up. He states he has been doing ok. He has been unable to exercise as he has been caring for his wife who has been receiving chemotherapy,  and he has recently moved and is in the process of selling his house.  He states he has been increasingly tired , and has had increasing shortness of breath. His oxygen saturations have been as low as 70%, but immediately rebound to > 96%. Sats today in the office were 100% after ambulating back to the exam room..  Patient states cough is worse when he has coughing jags.  He endorses that he could possibly be due to allergies, however with his complicated pulmonary history he wanted to make sure there was nothing else going on. Chest x-ray was done which was consistent with his history and diagnosis of diffuse interstitial lung disease, there was no new pulmonary finding that was of concern.  No consolidation, pneumothorax or effusion. He is due for a high-resolution CT  chest to evaluate for progression of his ILD.  We will get that ordered as well as consideration for pulmonary function test in the near future. He is wearing his CPAP daily and endorses benefit from therapy.  We will review a download when he returns after his high-resolution CT is done and we are evaluating the results. We did discuss doing a CBC to evaluate for anemia.  Patient states that he has an appointment with hematology 09/28/2022 and the CBC will be drawn at that time, and evaluated by Dr. Myna Hidalgo We will refer to pulmonary rehab to ensure patient gets his exercise as he has become a bit more deconditioned while caring for his wife and moving homes.   Test Results: CXR 09/16/2022 Hyperinflation with known diffuse interstitial changes from interstitial lung disease. Please correlate with prior high-resolution CT scan No consolidation, pneumothorax or effusion. Normal cardiopericardial silhouette.     Latest Ref Rng & Units 03/30/2022    9:03 AM 12/28/2021    7:50 AM 09/28/2021    8:15 AM  CBC  WBC 4.0 - 10.5 K/uL 4.6  5.7  5.2   Hemoglobin 13.0 - 17.0 g/dL 16.1  09.6  04.5   Hematocrit 39.0 - 52.0 % 35.8  36.0  35.5   Platelets 150 - 400 K/uL 171  194  174        Latest Ref Rng & Units 03/30/2022    9:03 AM 12/28/2021  7:50 AM 09/28/2021    8:15 AM  BMP  Glucose 70 - 99 mg/dL 161  97  86   BUN 8 - 23 mg/dL 20  19  15    Creatinine 0.61 - 1.24 mg/dL 0.96  0.45  4.09   Sodium 135 - 145 mmol/L 139  137  138   Potassium 3.5 - 5.1 mmol/L 4.3  4.0  4.0   Chloride 98 - 111 mmol/L 102  102  103   CO2 22 - 32 mmol/L 31  30  30    Calcium 8.9 - 10.3 mg/dL 81.1  9.9  9.8     BNP    Component Value Date/Time   BNP 225.1 (H) 05/19/2021 0828    ProBNP    Component Value Date/Time   PROBNP 225.0 (H) 03/26/2019 0909    PFT    Component Value Date/Time   FEV1PRE 2.53 04/01/2022 0949   FEV1POST 2.62 04/01/2022 0949   FVCPRE 3.38 04/01/2022 0949   FVCPOST 3.47 04/01/2022  0949   TLC 5.62 04/01/2022 0949   DLCOUNC 15.69 04/01/2022 0949   PREFEV1FVCRT 75 04/01/2022 0949   PSTFEV1FVCRT 76 04/01/2022 0949    No results found.   Past medical hx Past Medical History:  Diagnosis Date   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 08/2015 Cath/PCI: LM nl, LAD 71m, LCX small, nl, RCA 80p (4.0x16 Synergy DES),    Cancer (HCC)    skin on forehead   Cataracts, bilateral    surgery   Depression    pt denies    Diverticulosis    Dyspnea    occasional    GERD (gastroesophageal reflux disease)    Hilar adenopathy    a. 08/2015 CT Chest: mild bilat hilar adenopathy and mild soft tissue prominence in inf right hilum, left hilum. Also areas of soft tissue and low-density material filling LLL bronchial airways->? mucous vs mass.   Hx of adenomatous colonic polyps 08/13/2015   Hyperlipidemia    MVP (mitral valve prolapse)    Mild to moderate MR by echo 07/2022   Myocardial infarction [facility])    Nonspecific elevation of levels of transaminase or lactic acid dehydrogenase (LDH)    PMH of ; ? 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 CHA2DS2VASc = 2 -->Xarelto started 08/2015.   Pneumonia, bacterial 05/18/2012   Pulmonary HTN (HCC)    PASP on echo 08/2019>>Likely Group 3 related to bronchiectasis>>repeat echo 09/2020 with PASP   Pulmonary nodules    a. 08/2015 CT Chest: reticulonodular opacities in RUL - largest 5mm - likely 2/2 inflammatory process.   Tricuspid regurgitation      Social History   Tobacco Use   Smoking status: Never   Smokeless tobacco: Former    Types: Chew    Quit date: 05/16/1978   Tobacco comments:    occasional cigar in past; not regular smoker  Vaping Use   Vaping Use: Never  used  Substance Use Topics   Alcohol use: Yes    Comment: 5 beers per week    Drug use: No    Mr.Apps reports that he has never smoked. He quit smokeless tobacco  use about 44 years ago.  His smokeless tobacco use included chew. He reports current alcohol use. He reports that he does not use drugs.  Tobacco Cessation: Former remote smoker, quit 1980  Past surgical hx, Family hx, Social hx all reviewed.  Current Outpatient Medications on File Prior to Visit  Medication Sig   Cholecalciferol (VITAMIN D3) 2000 UNITS TABS Take 2,000 Units by mouth daily.    COVID-19 mRNA vaccine 2023-2024 (COMIRNATY) SUSP injection Inject into the muscle.   ezetimibe (ZETIA) 10 MG tablet Take 1 tablet (10 mg total) by mouth daily.   famotidine (PEPCID) 20 MG tablet Take 20 mg by mouth daily as needed for heartburn or indigestion.   hydrocortisone 2.5 % ointment Apply 1 application topically 2 (two) times daily as needed (irritation).    influenza vaccine adjuvanted (FLUAD QUADRIVALENT) 0.5 ML injection Inject into the muscle.   Melatonin 5 MG CAPS Take 5 mg by mouth at bedtime.   nitroGLYCERIN (NITROSTAT) 0.4 MG SL tablet Place 1 tablet (0.4 mg total) under the tongue every 5 (five) minutes x 3 doses as needed for chest pain.   Respiratory Therapy Supplies (FLUTTER) DEVI Use as directed   rivaroxaban (XARELTO) 20 MG TABS tablet Take 1 tablet (20 mg total) by mouth daily with supper.   RSV vaccine recomb adjuvanted (AREXVY) 120 MCG/0.5ML injection Inject into the muscle.   triamcinolone cream (KENALOG) 0.1 % Apply 1 application topically daily as needed (rash).    furosemide (LASIX) 20 MG tablet Take 1 tablet (20 mg total) by mouth daily. (Patient not taking: Reported on 09/16/2022)   Current Facility-Administered Medications on File Prior to Visit  Medication   gemcitabine (GEMZAR) chemo syringe for bladder instillation 2,000 mg     Allergies  Allergen Reactions   Amiodarone Other (See Comments)     elevated liver enzymes   Atorvastatin Other (See Comments)    Nocturnal leg cramps   Black Pepper-Turmeric Other (See Comments)    heartburn   Metoprolol Other (See Comments)    Hypotension     Review Of Systems:  Constitutional:   No  weight loss, night sweats,  Fevers, chills, +fatigue, or  lassitude.  HEENT:   No headaches,  Difficulty swallowing,  Tooth/dental problems, or  Sore throat,                No sneezing, itching, ear ache, nasal congestion, +post nasal drip,   CV:  No chest pain,  Orthopnea, PND, swelling in lower extremities, anasarca, dizziness, palpitations, syncope.   GI  No heartburn, indigestion, abdominal pain, nausea, vomiting, diarrhea, change in bowel habits, loss of appetite, bloody stools.   Resp:  + shortness of breath with exertion less at rest.  No excess mucus, no productive cough,  No non-productive cough,  No coughing up of blood.  No change in color of mucus.  No wheezing.  No chest wall deformity  Skin: no rash or lesions.  GU: no dysuria, change in color of urine, no urgency or frequency.  No flank pain, no hematuria   MS:  No joint pain or swelling.  No decreased range of motion.  No back pain.  Psych:  No change in mood or affect. No depression or anxiety.  No memory loss.   Vital Signs BP (!) 110/50 (BP Location: Left Arm, Patient Position: Sitting, Cuff Size: Normal)   Pulse 75   Temp 97.8 F (36.6 C) (Oral)   Ht 6\' 3"  (1.905 m)   Wt 160  lb 9.6 oz (72.8 kg)   SpO2 100%   BMI 20.07 kg/m    Physical Exam:  General- No distress,  A&Ox3, pleasant ENT: No sinus tenderness, TM clear, pale nasal mucosa, no oral exudate,no post nasal drip, no LAN Cardiac: S1, S2, regular rate and rhythm, no murmur Chest: No wheeze/ rales/ dullness; no accessory muscle use, no nasal flaring, no sternal retractions,  Abd.: Soft Non-tender, ND, BS +, Body mass index is 20.07 kg/m.  Ext: No clubbing cyanosis, edema Neuro:  normal strength, some physical  deconditioning Skin: No rashes, warm and dry, no lesions  Psych: normal mood and behavior   Assessment/Plan ILD/ NSIP OSA on CPAP>> great compliance Fatigue>> new onset PAH>> Managed by cardiology>> recent echo 07/2022 Anemia>> Follow up Hematology scheduled  09/2022 Plan We will do a CXR today.Stat please We will call you with results. We will schedule a HRCT, and have you follow up for results after.  If there are changes, we will do PFT's. Prednisone taper; 10 mg tablets: 4 tabs x 2 days, 3 tabs x 2 days, 2 tabs x 2 days 1 tab x 2 days then stop.  We will refer you to pulmonary rehab for your ILD. ( Under Dr. Jane Canary name) This will make you exercise on a regular basis. Follow up after HRCT Add Zyrtec once daily as needed for allergies. Continue on CPAP at bedtime. You appear to be benefiting from the treatment  Goal is to wear for at least 6 hours each night for maximal clinical benefit. Continue to work on weight loss, as the link between excess weight  and sleep apnea is well established.   Remember to establish a good bedtime routine, and work on sleep hygiene.  Limit daytime naps , avoid stimulants such as caffeine and nicotine close to bedtime, exercise daily to promote sleep quality, avoid heavy , spicy, fried , or rich foods before bed. Ensure adequate exposure to natural light during the day,establish a relaxing bedtime routine with a pleasant sleep environment ( Bedroom between 60 and 67 degrees, turn off bright lights , TV or device screens screens , consider black out curtains or white noise machines) Do not drive if sleepy. Remember to clean mask, tubing, filter, and reservoir once weekly with soapy water.  Follow up with Maralyn Sago NP  after HRCT  or before as needed.      I spent 35 minutes dedicated to the care of this patient on the date of this encounter to include pre-visit review of records, face-to-face time with the patient discussing conditions above, post visit  ordering of testing, clinical documentation with the electronic health record, making appropriate referrals as documented, and communicating necessary information to the patient's healthcare team.   Bevelyn Ngo, NP 09/16/2022  9:22 AM

## 2022-09-16 NOTE — Telephone Encounter (Signed)
ATC Brad with [employer].  LVM regarding download.  The only download available is from 04/05/2022-05/04/2022.  May be part of 3G shut off.  Requested download be e-mailed to me if available.

## 2022-09-16 NOTE — Patient Instructions (Addendum)
It is good to see you today. We will do a CXR today.Stat please We will call you with results. We will schedule a HRCT, and have you follow up for results after.  If there are changes, we will do PFT's. Prednisone taper; 10 mg tablets: 4 tabs x 2 days, 3 tabs x 2 days, 2 tabs x 2 days 1 tab x 2 days then stop.  We will refer you to pulmonary rehab for your ILD. ( Under Dr. Jane Canary name) This will make you exercise on a regular basis. Follow up after HRCT Add Zyrtec once daily as needed for allergies. Continue on CPAP at bedtime. You appear to be benefiting from the treatment  Goal is to wear for at least 6 hours each night for maximal clinical benefit. Continue to work on weight loss, as the link between excess weight  and sleep apnea is well established.   Remember to establish a good bedtime routine, and work on sleep hygiene.  Limit daytime naps , avoid stimulants such as caffeine and nicotine close to bedtime, exercise daily to promote sleep quality, avoid heavy , spicy, fried , or rich foods before bed. Ensure adequate exposure to natural light during the day,establish a relaxing bedtime routine with a pleasant sleep environment ( Bedroom between 60 and 67 degrees, turn off bright lights , TV or device screens screens , consider black out curtains or white noise machines) Do not drive if sleepy. Remember to clean mask, tubing, filter, and reservoir once weekly with soapy water.  Follow up with Maralyn Sago NP  after HRCT  or before as needed.

## 2022-09-18 DIAGNOSIS — G4733 Obstructive sleep apnea (adult) (pediatric): Secondary | ICD-10-CM | POA: Diagnosis not present

## 2022-09-19 ENCOUNTER — Other Ambulatory Visit (HOSPITAL_BASED_OUTPATIENT_CLINIC_OR_DEPARTMENT_OTHER): Payer: Self-pay

## 2022-09-20 ENCOUNTER — Encounter: Payer: Self-pay | Admitting: Family

## 2022-09-21 ENCOUNTER — Telehealth (HOSPITAL_BASED_OUTPATIENT_CLINIC_OR_DEPARTMENT_OTHER): Payer: Self-pay

## 2022-09-21 ENCOUNTER — Ambulatory Visit (HOSPITAL_BASED_OUTPATIENT_CLINIC_OR_DEPARTMENT_OTHER)
Admission: RE | Admit: 2022-09-21 | Discharge: 2022-09-21 | Disposition: A | Discharge status: Home / Self Care | Payer: PPO | Source: Ambulatory Visit | Attending: Acute Care | Admitting: Acute Care

## 2022-09-21 DIAGNOSIS — J439 Emphysema, unspecified: Secondary | ICD-10-CM | POA: Diagnosis not present

## 2022-09-21 DIAGNOSIS — R918 Other nonspecific abnormal finding of lung field: Secondary | ICD-10-CM | POA: Diagnosis not present

## 2022-09-21 DIAGNOSIS — J849 Interstitial pulmonary disease, unspecified: Secondary | ICD-10-CM | POA: Insufficient documentation

## 2022-09-28 ENCOUNTER — Other Ambulatory Visit: Payer: Self-pay

## 2022-09-28 ENCOUNTER — Inpatient Hospital Stay: Payer: PPO | Attending: Hematology & Oncology

## 2022-09-28 ENCOUNTER — Encounter: Payer: Self-pay | Admitting: Hematology & Oncology

## 2022-09-28 ENCOUNTER — Inpatient Hospital Stay (HOSPITAL_BASED_OUTPATIENT_CLINIC_OR_DEPARTMENT_OTHER): Payer: PPO | Admitting: Hematology & Oncology

## 2022-09-28 VITALS — BP 110/64 | HR 71 | Temp 98.2°F | Resp 16 | Ht 75.0 in | Wt 157.8 lb

## 2022-09-28 DIAGNOSIS — D539 Nutritional anemia, unspecified: Secondary | ICD-10-CM | POA: Insufficient documentation

## 2022-09-28 DIAGNOSIS — I482 Chronic atrial fibrillation, unspecified: Secondary | ICD-10-CM | POA: Diagnosis not present

## 2022-09-28 DIAGNOSIS — E7800 Pure hypercholesterolemia, unspecified: Secondary | ICD-10-CM

## 2022-09-28 DIAGNOSIS — D518 Other vitamin B12 deficiency anemias: Secondary | ICD-10-CM | POA: Diagnosis not present

## 2022-09-28 DIAGNOSIS — D649 Anemia, unspecified: Secondary | ICD-10-CM

## 2022-09-28 DIAGNOSIS — Z7901 Long term (current) use of anticoagulants: Secondary | ICD-10-CM | POA: Diagnosis not present

## 2022-09-28 LAB — LIPID PANEL
Cholesterol: 180 mg/dL (ref 0–200)
HDL: 61 mg/dL (ref >40)
LDL Cholesterol: 101 mg/dL — ABNORMAL HIGH (ref 0–99)
Total CHOL/HDL Ratio: 3 RATIO
Triglycerides: 92 mg/dL (ref <150)
VLDL: 18 mg/dL (ref 0–40)

## 2022-09-28 LAB — CBC WITH DIFFERENTIAL (CANCER CENTER ONLY)
Abs Immature Granulocytes: 0.08 10*3/uL — ABNORMAL HIGH (ref 0.00–0.07)
Basophils Absolute: 0.1 10*3/uL (ref 0.0–0.1)
Basophils Relative: 1 %
Eosinophils Absolute: 0.5 10*3/uL (ref 0.0–0.5)
Eosinophils Relative: 7 %
HCT: 40.3 % (ref 39.0–52.0)
Hemoglobin: 13.1 g/dL (ref 13.0–17.0)
Immature Granulocytes: 1 %
Lymphocytes Relative: 18 %
Lymphs Abs: 1.4 10*3/uL (ref 0.7–4.0)
MCH: 34.1 pg — ABNORMAL HIGH (ref 26.0–34.0)
MCHC: 32.5 g/dL (ref 30.0–36.0)
MCV: 104.9 fL — ABNORMAL HIGH (ref 80.0–100.0)
Monocytes Absolute: 1.1 10*3/uL — ABNORMAL HIGH (ref 0.1–1.0)
Monocytes Relative: 13 %
Neutro Abs: 4.8 10*3/uL (ref 1.7–7.7)
Neutrophils Relative %: 60 %
Platelet Count: 210 10*3/uL (ref 150–400)
RBC: 3.84 MIL/uL — ABNORMAL LOW (ref 4.22–5.81)
RDW: 13.5 % (ref 11.5–15.5)
WBC Count: 8 10*3/uL (ref 4.0–10.5)
nRBC: 0 % (ref 0.0–0.2)

## 2022-09-28 LAB — CMP (CANCER CENTER ONLY)
ALT: 25 U/L (ref 0–44)
AST: 27 U/L (ref 15–41)
Albumin: 4.2 g/dL (ref 3.5–5.0)
Alkaline Phosphatase: 69 U/L (ref 38–126)
Anion gap: 11 (ref 5–15)
BUN: 20 mg/dL (ref 8–23)
CO2: 29 mmol/L (ref 22–32)
Calcium: 10.2 mg/dL (ref 8.9–10.3)
Chloride: 100 mmol/L (ref 98–111)
Creatinine: 0.97 mg/dL (ref 0.61–1.24)
GFR, Estimated: >60 mL/min (ref >60)
Glucose, Bld: 100 mg/dL — ABNORMAL HIGH (ref 70–99)
Potassium: 4.1 mmol/L (ref 3.5–5.1)
Sodium: 140 mmol/L (ref 135–145)
Total Bilirubin: 0.8 mg/dL (ref 0.3–1.2)
Total Protein: 8.2 g/dL — ABNORMAL HIGH (ref 6.5–8.1)

## 2022-09-28 LAB — RETICULOCYTES
Immature Retic Fract: 9.9 % (ref 2.3–15.9)
RBC.: 3.75 MIL/uL — ABNORMAL LOW (ref 4.22–5.81)
Retic Count, Absolute: 44.6 10*3/uL (ref 19.0–186.0)
Retic Ct Pct: 1.2 % (ref 0.4–3.1)

## 2022-09-28 LAB — SAVE SMEAR(SSMR), FOR PROVIDER SLIDE REVIEW

## 2022-09-28 LAB — IRON AND IRON BINDING CAPACITY (CC-WL,HP ONLY)
Iron: 116 ug/dL (ref 45–182)
Saturation Ratios: 41 % — ABNORMAL HIGH (ref 17.9–39.5)
TIBC: 284 ug/dL (ref 250–450)
UIBC: 168 ug/dL (ref 117–376)

## 2022-09-28 LAB — VITAMIN B12: Vitamin B-12: 1325 pg/mL — ABNORMAL HIGH (ref 180–914)

## 2022-09-28 LAB — FERRITIN: Ferritin: 147 ng/mL (ref 24–336)

## 2022-09-28 NOTE — Progress Notes (Signed)
lp

## 2022-09-28 NOTE — Progress Notes (Signed)
Hematology and Oncology Follow Up Visit  Brison Wist [id number] 07-16-1945 77 y.o. 09/28/2022   Principle Diagnosis:  Macrocytic anemia    Current Therapy:        B12 2500 mcg PO daily   Interim History:  Mr. Kallsen is here today for follow-up.  This is first time that I am seeing him.  I actually have known him for quite a while.  He is a husband of one of our patients.  He has a macrocytic anemia.  However, he is not anemic.  I looked at his blood smear.  I do not see anything on the blood smear that looked suspicious.  He does take vitamin B-12 orally.  He does not have a low B12 level.  I suppose that he may have an element of myelodysplasia although again he is not cytopenic at all.[name]  He does have little bit of a cough.  He has had this for a while.  He has seen Pulmonary Medicine and then have helped quite a bit.  His appetite is good.  He is not a vegetarian.  He has had no rashes.  There is been no swollen lymph nodes.  He has had no change in bowel or bladder habits.  He has had no bleeding.  He is on Xarelto for chronic atrial fibrillation.  Overall, I was his performance status is probably ECOG 0.    Medications:  Allergies as of 09/28/2022       Reactions   Amiodarone Other (See Comments)   elevated liver enzymes   Atorvastatin Other (See Comments)   Nocturnal leg cramps   Black Pepper-turmeric Other (See Comments)   heartburn   Metoprolol Other (See Comments)   Hypotension   Prevacid [lansoprazole] Other (See Comments)   Cannot take with plavix        Medication List        Accurate as of Sep 28, 2022  8:27 AM. If you have any questions, ask your nurse or doctor.          Arexvy 120 MCG/0.5ML injection Generic drug: RSV vaccine recomb adjuvanted Inject into the muscle.   celecoxib 50 MG capsule Commonly known as: CELEBREX Take 50 mg by mouth.   clopidogrel 75 MG tablet Commonly known as: PLAVIX Take 75 mg by mouth.   COVID-19  mRNA vaccine 2023-2024 Susp injection Commonly known as: COMIRNATY Inject into the muscle.   ezetimibe 10 MG tablet Commonly known as: ZETIA Take 1 tablet (10 mg total) by mouth daily.   famotidine 20 MG tablet Commonly known as: PEPCID Take 20 mg by mouth daily as needed for heartburn or indigestion.   Fluad Quadrivalent 0.5 ML injection Generic drug: influenza vaccine adjuvanted Inject into the muscle.   Flutter Devi Use as directed   furosemide 20 MG tablet Commonly known as: LASIX Take 1 tablet (20 mg total) by mouth daily.   HYDROCODONE-GUAIFENESIN PO Take by mouth.   hydrocortisone 2.5 % ointment Apply 1 application topically 2 (two) times daily as needed (irritation).   Melatonin 5 MG Caps Take 5 mg by mouth at bedtime.   nitroGLYCERIN 0.4 MG SL tablet Commonly known as: NITROSTAT Place 1 tablet (0.4 mg total) under the tongue every 5 (five) minutes x 3 doses as needed for chest pain.   triamcinolone cream 0.1 % Commonly known as: KENALOG Apply 1 application topically daily as needed (rash).   Vitamin D3 50 MCG (2000 UT) Tabs Take 2,000 Units by mouth daily.  Xarelto 20 MG Tabs tablet Generic drug: rivaroxaban Take 1 tablet (20 mg total) by mouth daily with supper.        Allergies:  Allergies  Allergen Reactions   Amiodarone Other (See Comments)    elevated liver enzymes   Atorvastatin Other (See Comments)    Nocturnal leg cramps   Black Pepper-Turmeric Other (See Comments)    heartburn   Metoprolol Other (See Comments)    Hypotension    Prevacid [Lansoprazole] Other (See Comments)    Cannot take with plavix    Past Medical History, Surgical history, Social history, and Family History were reviewed and updated.  Review of Systems: All other 10 point review of systems is negative.   Physical Exam:  height is 6\' 3"  (1.905 m) and weight is 157 lb 12.8 oz (71.6 kg). His oral temperature is 98.2 F (36.8 C). His blood pressure is 110/64  and his pulse is 71. His respiration is 16 and oxygen saturation is 100%.   Wt Readings from Last 3 Encounters:  09/28/22 157 lb 12.8 oz (71.6 kg)  09/16/22 160 lb 9.6 oz (72.8 kg)  07/05/22 170 lb (77.1 kg)    Physical Exam Vitals reviewed.  HENT:     Head: Normocephalic and atraumatic.  Eyes:     Pupils: Pupils are equal, round, and reactive to light.  Cardiovascular:     Rate and Rhythm: Normal rate and regular rhythm.     Heart sounds: Normal heart sounds.     Comments: Cardiac exam is regular rate and rhythm consistent with atrial fibrillation.  He has no murmurs, rubs or bruits. Pulmonary:     Effort: Pulmonary effort is normal.     Breath sounds: Normal breath sounds.  Abdominal:     General: Bowel sounds are normal.     Palpations: Abdomen is soft.  Musculoskeletal:        General: No tenderness or deformity. Normal range of motion.     Cervical back: Normal range of motion.  Lymphadenopathy:     Cervical: No cervical adenopathy.  Skin:    General: Skin is warm and dry.     Findings: No erythema or rash.  Neurological:     Mental Status: He is alert and oriented to person, place, and time.  Psychiatric:        Behavior: Behavior normal.        Thought Content: Thought content normal.        Judgment: Judgment normal.      Lab Results  Component Value Date   WBC 8.0 09/28/2022   HGB 13.1 09/28/2022   HCT 40.3 09/28/2022   MCV 104.9 (H) 09/28/2022   PLT 210 09/28/2022   Lab Results  Component Value Date   FERRITIN 53 09/28/2021   IRON 187 (H) 09/28/2021   TIBC 325 09/28/2021   UIBC 138 09/28/2021   IRONPCTSAT 58 (H) 09/28/2021   Lab Results  Component Value Date   RETICCTPCT 1.2 09/28/2022   RBC 3.84 (L) 09/28/2022   RBC 3.75 (L) 09/28/2022   No results found for: "KPAFRELGTCHN", "LAMBDASER", "KAPLAMBRATIO" No results found for: "IGGSERUM", "IGA", "IGMSERUM" No results found for: "TOTALPROTELP", "ALBUMINELP", "A1GS", "A2GS", "BETS", "BETA2SER",  "GAMS", "MSPIKE", "SPEI"   Chemistry      Component Value Date/Time   NA 140 09/28/2022 0748   NA 139 07/12/2021 0821   K 4.1 09/28/2022 0748   CL 100 09/28/2022 0748   CO2 29 09/28/2022 0748   BUN 20 09/28/2022  0748   BUN 19 07/12/2021 0821   CREATININE 0.97 09/28/2022 0748   CREATININE 0.86 04/01/2016 0829      Component Value Date/Time   CALCIUM 10.2 09/28/2022 0748   ALKPHOS 69 09/28/2022 0748   AST 27 09/28/2022 0748   ALT 25 09/28/2022 0748   BILITOT 0.8 09/28/2022 0748       Impression and Plan: Ms. Buckmaster is a very pleasant 77 yo caucasian gentleman with macrocytic anemia.  He really is not anemic at all.  His indices are on the higher side.  Again I am not really worried about this.  His blood smear is totally benign.  We will plan to get him back in 6 more months just for follow-up.  I suppose that he could always be at risk for developing myelodysplasia.     Josph Macho, MD 5/15/20248:27 AM

## 2022-09-28 NOTE — Telephone Encounter (Signed)
Can you finish the PA I started for Nexlizet with his new labs? Key is in the previous message. thanks

## 2022-09-29 ENCOUNTER — Other Ambulatory Visit (HOSPITAL_COMMUNITY): Payer: Self-pay

## 2022-09-30 ENCOUNTER — Other Ambulatory Visit (HOSPITAL_BASED_OUTPATIENT_CLINIC_OR_DEPARTMENT_OTHER): Payer: Self-pay

## 2022-09-30 ENCOUNTER — Encounter: Payer: Self-pay | Admitting: Pharmacist

## 2022-09-30 MED ORDER — NEXLIZET 180-10 MG PO TABS
1.0000 | ORAL_TABLET | Freq: Every day | ORAL | 3 refills | Status: DC
  Filled 2022-09-30: qty 90, 90d supply, fill #0
  Filled 2022-12-08: qty 90, 90d supply, fill #1
  Filled 2023-03-02: qty 90, 90d supply, fill #2
  Filled 2023-05-27: qty 90, 90d supply, fill #3

## 2022-09-30 NOTE — Telephone Encounter (Signed)
Nexlizet approved though 04/01/23. Mychart message sent to patient.

## 2022-10-04 ENCOUNTER — Encounter: Payer: Self-pay | Admitting: Family

## 2022-10-04 ENCOUNTER — Other Ambulatory Visit (HOSPITAL_BASED_OUTPATIENT_CLINIC_OR_DEPARTMENT_OTHER): Payer: Self-pay

## 2022-10-05 ENCOUNTER — Telehealth: Payer: Self-pay

## 2022-10-05 NOTE — Telephone Encounter (Signed)
Pharmacy Patient Advocate Encounter  *PRIOR AUTH SENT VIA EMAIL/FAX  Prior Authorization for Nexlizet has been approved by [employer] (ins).     Effective dates: 5.16.24 through 11.16.24  *see approval in media

## 2022-10-05 NOTE — Telephone Encounter (Signed)
PCC's, can you please advise on when pt should hear from rehab? Thanks!

## 2022-10-06 ENCOUNTER — Telehealth (HOSPITAL_COMMUNITY): Payer: Self-pay

## 2022-10-06 NOTE — Telephone Encounter (Signed)
Called patient to see if he is interested in the Pulmonary Rehab Program. Patient expressed interest. Explained scheduling process patient verbalized understanding.

## 2022-10-06 NOTE — Telephone Encounter (Signed)
There is a note in the referral dated today where pt was contacted about scheduling.  Will route back to triage so message can be closed.

## 2022-10-11 ENCOUNTER — Telehealth: Payer: Self-pay | Admitting: Acute Care

## 2022-10-11 NOTE — Telephone Encounter (Signed)
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 He is experiencing SOB more then his usual. Can I place this pt in one of your blocked slots ? Dr.Dewald please advise.

## 2022-10-11 NOTE — Telephone Encounter (Signed)
Can we see if there is anyone available to see Troy Willis this week? [name] His daughter reached out to me , said he is more short of breath than usual. Not sure if it is his heart or his ILD, but we need to see him. ( Mannam of [facility] , MD if able as he has seen NP's several times, and McQuaid is no longer here. Thanks

## 2022-10-13 NOTE — Telephone Encounter (Signed)
He can be added to my schedule for 6/4.  Thanks, JD

## 2022-10-13 NOTE — Telephone Encounter (Signed)
Called and spoke with the pt  Offered appt with Dr Francine Graven for 10/18/22  He states he is wanting to see Dr Tonia Brooms instead since he is familiar with him   Dr Tonia Brooms- are you okay seeing this pt? All you have coming up soon are held nodule spots. Thanks.

## 2022-10-19 DIAGNOSIS — G4733 Obstructive sleep apnea (adult) (pediatric): Secondary | ICD-10-CM | POA: Diagnosis not present

## 2022-10-31 ENCOUNTER — Encounter (HOSPITAL_COMMUNITY): Payer: Self-pay

## 2022-11-03 ENCOUNTER — Ambulatory Visit (INDEPENDENT_AMBULATORY_CARE_PROVIDER_SITE_OTHER): Payer: PPO | Admitting: Internal Medicine

## 2022-11-03 ENCOUNTER — Telehealth: Payer: Self-pay | Admitting: Internal Medicine

## 2022-11-03 ENCOUNTER — Telehealth (HOSPITAL_COMMUNITY): Payer: Self-pay

## 2022-11-03 ENCOUNTER — Encounter: Payer: Self-pay | Admitting: Internal Medicine

## 2022-11-03 VITALS — BP 110/60 | HR 63 | Ht 73.0 in | Wt 160.0 lb

## 2022-11-03 DIAGNOSIS — R931 Abnormal findings on diagnostic imaging of heart and coronary circulation: Secondary | ICD-10-CM

## 2022-11-03 DIAGNOSIS — I080 Rheumatic disorders of both mitral and aortic valves: Secondary | ICD-10-CM | POA: Diagnosis not present

## 2022-11-03 DIAGNOSIS — J849 Interstitial pulmonary disease, unspecified: Secondary | ICD-10-CM

## 2022-11-03 DIAGNOSIS — Z836 Family history of other diseases of the respiratory system: Secondary | ICD-10-CM

## 2022-11-03 DIAGNOSIS — K219 Gastro-esophageal reflux disease without esophagitis: Secondary | ICD-10-CM

## 2022-11-03 LAB — CBC WITH DIFFERENTIAL/PLATELET
Basophils Absolute: 0.1 10*3/uL (ref 0.0–0.1)
Basophils Relative: 1.2 % (ref 0.0–3.0)
Eosinophils Absolute: 0.1 10*3/uL (ref 0.0–0.7)
Eosinophils Relative: 2.2 % (ref 0.0–5.0)
HCT: 37 % — ABNORMAL LOW (ref 39.0–52.0)
Hemoglobin: 12.3 g/dL — ABNORMAL LOW (ref 13.0–17.0)
Lymphocytes Relative: 26.3 % (ref 12.0–46.0)
Lymphs Abs: 1.5 10*3/uL (ref 0.7–4.0)
MCHC: 33.3 g/dL (ref 30.0–36.0)
MCV: 104.8 fl — ABNORMAL HIGH (ref 78.0–100.0)
Monocytes Absolute: 0.6 10*3/uL (ref 0.1–1.0)
Monocytes Relative: 10.2 % (ref 3.0–12.0)
Neutro Abs: 3.4 10*3/uL (ref 1.4–7.7)
Neutrophils Relative %: 60.1 % (ref 43.0–77.0)
Platelets: 197 10*3/uL (ref 150.0–400.0)
RBC: 3.53 Mil/uL — ABNORMAL LOW (ref 4.22–5.81)
RDW: 13.6 % (ref 11.5–15.5)
WBC: 5.7 10*3/uL (ref 4.0–10.5)

## 2022-11-03 LAB — BASIC METABOLIC PANEL
BUN: 25 mg/dL — ABNORMAL HIGH (ref 6–23)
CO2: 30 mEq/L (ref 19–32)
Calcium: 9.9 mg/dL (ref 8.4–10.5)
Chloride: 103 mEq/L (ref 96–112)
Creatinine, Ser: 0.94 mg/dL (ref 0.40–1.50)
GFR: 77.91 mL/min (ref >60.00)
Glucose, Bld: 87 mg/dL (ref 70–99)
Potassium: 5 mEq/L (ref 3.5–5.1)
Sodium: 139 mEq/L (ref 135–145)

## 2022-11-03 LAB — HEPATIC FUNCTION PANEL
ALT: 13 U/L (ref 0–53)
AST: 26 U/L (ref 0–37)
Albumin: 4.1 g/dL (ref 3.5–5.2)
Alkaline Phosphatase: 55 U/L (ref 39–117)
Bilirubin, Direct: 0.1 mg/dL (ref 0.0–0.3)
Total Bilirubin: 0.4 mg/dL (ref 0.2–1.2)
Total Protein: 8.1 g/dL (ref 6.0–8.3)

## 2022-11-03 LAB — BRAIN NATRIURETIC PEPTIDE: Pro B Natriuretic peptide (BNP): 224 pg/mL — ABNORMAL HIGH (ref 0.0–100.0)

## 2022-11-03 LAB — CK: Total CK: 74 U/L (ref 7–232)

## 2022-11-03 NOTE — Telephone Encounter (Signed)
Called to confirm appt. Pt confirmed appt. Instructed pt on proper footwear. Gave directions along with department number.   

## 2022-11-03 NOTE — Progress Notes (Signed)
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 record an interpretation, but not separately reporting (billing) the interpretation of the chest radiograph.   An appropriate source includes professionals who are not health care professionals but may be involved in the management of the patient, such as a Clinical research associate, upper officer, case manager or teacher, and does not include discussion with family or informal caregivers.    - SDOH: SDOH are the conditions in the environments where people are born, live, learn, work, play, worship, and age that affect a wide range of health, functioning, and quality-of-life outcomes and risks. (e.g., housing, food insecurity, transportation, etc.). SDOH-related Z codes  ranging from Z55-Z65 are the ICD-10-CM diagnosis codes used to document SDOH data Z55 - Problems related to education and literacy Z56 - Problems related to employment and unemployment Z57 - Occupational exposure to risk factors Z58 - Problems related to physical environment Z59 - Problems related to housing and economic circumstances [phone] - Problems related to social environment [phone] - Problems related to upbringing [phone] - Other problems related to primary support group, including family circumstances Z60 - Problems related to certain psychosocial circumstances Z65 - Problems related to other psychosocial circumstances

## 2022-11-03 NOTE — Telephone Encounter (Signed)
Pls stell him to get rid of down mattress, jacket etc.,

## 2022-11-03 NOTE — Telephone Encounter (Signed)
Sent pt MyChart message. Nothing further needed.

## 2022-11-03 NOTE — Patient Instructions (Addendum)
ICD-10-CM   1. ILD (interstitial lung disease) (HCC)  J84.9 CBC w/Diff    Hepatic function panel    CK (Creatine Kinase)    Aldolase    ANA+ENA+DNA/DS+Scl 70+SjoSSA/B    Basic Metabolic Panel (BMET)    QuantiFERON-TB Gold Plus    Hypersensitivity Pneumonitis    Cyclic citrul peptide antibody, IgG    IgE    Rheumatoid Factor    B Nat Peptide    Quantiferon tb gold assay    2. Abnormal echocardiogram  R93.1     3. MITRAL REGURGITATION  I08.0     4. Gastroesophageal reflux disease, unspecified whether esophagitis present  K21.9       There is mild interstitial lung disease and over time 2017 -> 2020 -> 2024 it appears progressive Currently you are not in favor of anti-firotics base don shared decision making  Plan  - Glad you are starting pulmonary rehabilitation tomorrow - Do blood work to understand some of the causes for pulmonary fibrosis - We will continue to keep an eye on the situation for now -Do spirometry and DLCO in 3 months - refer genetic counselor Maylon Cos for Family History of Fibrosis -I will also discuss in case conference -Check a BNP blood work to understand if he might have pulmonary hypertension as a association with the interstitial lung disease -Read about approved antifibrotic's called pirfenidone (esbriet) and nintedanib (ofev); we can discuss this at next visit - review GI workup at next visit - get rid of down jacket, mattress  Follow-up - After spirometry and DLCO in 3 months; 30-minute visit to discuss next steps  -Symptoms: Simple exercise hypoxemia test at follow-up

## 2022-11-04 ENCOUNTER — Encounter (HOSPITAL_COMMUNITY): Payer: Self-pay

## 2022-11-04 ENCOUNTER — Encounter (HOSPITAL_COMMUNITY)
Admission: RE | Admit: 2022-11-04 | Discharge: 2022-11-04 | Disposition: A | Discharge status: Home / Self Care | Payer: PPO | Source: Ambulatory Visit | Attending: Internal Medicine | Admitting: Internal Medicine

## 2022-11-04 VITALS — BP 110/58 | HR 67 | Wt 157.4 lb

## 2022-11-04 DIAGNOSIS — J849 Interstitial pulmonary disease, unspecified: Secondary | ICD-10-CM | POA: Diagnosis not present

## 2022-11-04 NOTE — Progress Notes (Signed)
Pulmonary Individual Treatment Plan  Patient Details  Name: Troy Willis MRN: [id number] Date of Birth: [birth year] Referring Provider:   Doristine Devoid Pulmonary Rehab Walk Test from 11/04/2022 in [facility]  Referring Provider Ramaswamy       Initial Encounter Date:  Flowsheet Row Pulmonary Rehab Walk Test from 11/04/2022 in [facility] for [facility], Vascular, [facility]  Date 11/04/22       Visit Diagnosis: ILD (interstitial lung disease) (HCC)  Patient's Home Medications on Admission:   Current Outpatient Medications:    Bempedoic Acid-Ezetimibe (NEXLIZET) 180-10 MG TABS, Take 1 tablet by mouth daily., Disp: 90 tablet, Rfl: 3   celecoxib (CELEBREX) 50 MG capsule, Take 50 mg by mouth., Disp: , Rfl:    Cholecalciferol (VITAMIN D3) 2000 UNITS TABS, Take 2,000 Units by mouth daily. , Disp: , Rfl:    COVID-19 mRNA vaccine 2023-2024 (COMIRNATY) SUSP injection, Inject into the muscle., Disp: 0.3 mL, Rfl: 0   famotidine (PEPCID) 20 MG tablet, Take 20 mg by mouth daily as needed for heartburn or indigestion., Disp: , Rfl:    furosemide (LASIX) 20 MG tablet, Take 1 tablet (20 mg total) by mouth daily., Disp: 90 tablet, Rfl: 3   HYDROCODONE-GUAIFENESIN PO, Take by mouth., Disp: , Rfl:    hydrocortisone 2.5 % ointment, Apply 1 application topically 2 (two) times daily as needed (irritation). , Disp: , Rfl:    influenza vaccine adjuvanted (FLUAD QUADRIVALENT) 0.5 ML injection, Inject into the muscle., Disp: 0.5 mL, Rfl: 0   Melatonin 5 MG CAPS, Take 5 mg by mouth at bedtime., Disp: , Rfl:    nitroGLYCERIN (NITROSTAT) 0.4 MG SL tablet, Place 1 tablet (0.4 mg total) under the tongue every 5 (five) minutes x 3 doses as needed for chest pain., Disp: 25 tablet, Rfl: 3   Respiratory Therapy Supplies (FLUTTER) DEVI, Use as directed, Disp: 1 each, Rfl: 0   rivaroxaban (XARELTO) 20 MG TABS tablet, Take 1 tablet  (20 mg total) by mouth daily with supper., Disp: 90 tablet, Rfl: 1   RSV vaccine recomb adjuvanted (AREXVY) 120 MCG/0.5ML injection, Inject into the muscle., Disp: 0.5 mL, Rfl: 0   triamcinolone cream (KENALOG) 0.1 %, Apply 1 application topically daily as needed (rash). , Disp: , Rfl:  No current facility-administered medications for this encounter.  Facility-Administered Medications Ordered in Other Encounters:    gemcitabine (GEMZAR) chemo syringe for bladder instillation 2,000 mg, 2,000 mg, Bladder Instillation, Once, Bjorn Pippin, MD  Past Medical History: Past Medical History:  Diagnosis Date   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 08/2015 CT Chest: mild bilat hilar adenopathy and mild soft tissue prominence in inf right hilum, left hilum. Also areas of soft tissue and low-density material filling LLL bronchial airways->?  mucous vs mass.   Hx of adenomatous colonic polyps 08/13/2015   Hyperlipidemia    MVP (mitral valve prolapse)    Mild to moderate MR by echo 07/2022   Myocardial infarction [facility])    Nonspecific elevation of levels of transaminase or lactic acid dehydrogenase (LDH)    PMH of ; ? 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 CHA2DS2VASc = 2 -->Xarelto started 08/2015.   Pneumonia, bacterial 05/18/2012   Pulmonary HTN (HCC)    PASP on echo 08/2019>>Likely Group 3 related to bronchiectasis>>repeat echo 09/2020 with PASP    Pulmonary nodules    a. 08/2015 CT Chest: reticulonodular opacities in RUL - largest 5mm - likely 2/2 inflammatory process.   Tricuspid regurgitation     Tobacco Use: Social History   Tobacco Use  Smoking Status Never  Smokeless Tobacco Former   Types: Chew   Quit date: 05/16/1978  Tobacco Comments   occasional cigar in past; not regular smoker    Labs: Review Flowsheet  More data exists      Latest Ref Rng & Units 01/12/2021 04/01/2021 07/12/2021 09/10/2021 09/28/2022  Labs for ITP Cardiac and Pulmonary Rehab  Cholestrol 0 - 200 mg/dL 161  096  045  409  811   LDL (calc) 0 - 99 mg/dL 75  54  95  64  914   HDL-C >40 mg/dL 69  64  65  63  61   Trlycerides <150 mg/dL 41  69  55  39  92     Capillary Blood Glucose: No results found for: "GLUCAP"   Pulmonary Assessment Scores:  Pulmonary Assessment Scores     Row Name 11/04/22 1159         ADL UCSD   ADL Phase Entry     SOB Score total 26       CAT Score   CAT Score 16       mMRC Score   mMRC Score 2             UCSD: Self-administered rating of dyspnea associated with activities of daily living (ADLs) 6-point scale (0 = "not at all" to 5 = "maximal or unable to do because of breathlessness")  Scoring Scores range from 0 to 120.  Minimally important difference is 5 units  CAT: CAT can identify the health impairment of COPD patients and is better correlated with disease progression.  CAT has a scoring range of zero to 40. The CAT score is classified into four groups of low (less than 10), medium (10 - 20), high (21-30) and very high (31-40) based on the impact level of disease on health status. A CAT score over 10 suggests significant symptoms.  A worsening CAT score could be explained by an exacerbation, poor medication adherence, poor inhaler technique, or progression of COPD or comorbid conditions.  CAT MCID is 2 points  mMRC: mMRC (Modified Medical Research Council) Dyspnea Scale is used to assess  the degree of baseline functional disability in patients of respiratory disease due to dyspnea. No minimal important difference is established. A decrease in score of 1 point or greater is considered a positive change.   Pulmonary Function Assessment:  Pulmonary Function Assessment - 11/04/22 1110       Breath   Bilateral Breath Sounds Rales;Basilar    Shortness of Breath Limiting activity;Yes             Exercise  Target Goals: Exercise Program Goal: Individual exercise prescription set using results from initial 6 min walk test and THRR while considering  patient's activity barriers and safety.   Exercise Prescription Goal: Initial exercise prescription builds to 30-45 minutes a day of aerobic activity, 2-3 days per week.  Home exercise guidelines will be given to patient during program as part of exercise prescription that the participant will acknowledge.  Activity Barriers & Risk Stratification:  Activity Barriers & Cardiac Risk Stratification - 11/04/22 1157       Activity Barriers & Cardiac Risk Stratification   Activity Barriers Arthritis;Muscular Weakness;Deconditioning;Shortness of Breath    Cardiac Risk Stratification Low             6 Minute Walk:  6 Minute Walk     Row Name 11/04/22 1159         6 Minute Walk   Distance 1825 feet     Walk Time 6 minutes     # of Rest Breaks 0     MPH 3.46     METS 3.99     RPE 13     Perceived Dyspnea  2     VO2 Peak 13.98     Symptoms No     Resting HR 63 bpm     Resting BP 110/58     Resting Oxygen Saturation  100 %     Exercise Oxygen Saturation  during 6 min walk 91 %     Max Ex. HR 112 bpm     Max Ex. BP 132/64     2 Minute Post BP 114/70       Interval HR   1 Minute HR 68     2 Minute HR 108     3 Minute HR 104     4 Minute HR 102     5 Minute HR 112     6 Minute HR 104     2 Minute Post HR 62     Interval Heart Rate? Yes       Interval Oxygen   Interval Oxygen? Yes     Baseline Oxygen  Saturation % 100 %     1 Minute Oxygen Saturation % 97 %     1 Minute Liters of Oxygen 0 L     2 Minute Oxygen Saturation % 95 %     2 Minute Liters of Oxygen 0 L     3 Minute Oxygen Saturation % 93 %     3 Minute Liters of Oxygen 0 L     4 Minute Oxygen Saturation % 91 %     4 Minute Liters of Oxygen 0 L     5 Minute Oxygen Saturation % 91 %     5 Minute Liters of Oxygen 0 L     6 Minute Oxygen Saturation % 92 %     6 Minute Liters of Oxygen 0 L     2 Minute Post Oxygen Saturation % 98 %     2 Minute Post Liters of Oxygen 0 L              Oxygen Initial Assessment:  Oxygen Initial Assessment - 11/04/22 1109       Home Oxygen   Home Oxygen Device None    Sleep Oxygen Prescription CPAP    Home Exercise Oxygen Prescription None    Home Resting Oxygen Prescription None      Initial 6 min Walk   Oxygen Used  None      Program Oxygen Prescription   Program Oxygen Prescription None      Intervention   Short Term Goals To learn and exhibit compliance with exercise, home and travel O2 prescription;To learn and understand importance of maintaining oxygen saturations>88%;To learn and understand importance of monitoring SPO2 with pulse oximeter and demonstrate accurate use of the pulse oximeter.;To learn and demonstrate proper pursed lip breathing techniques or other breathing techniques.     Long  Term Goals Exhibits compliance with exercise, home  and travel O2 prescription;Maintenance of O2 saturations>88%;Verbalizes importance of monitoring SPO2 with pulse oximeter and return demonstration;Exhibits proper breathing techniques, such as pursed lip breathing or other method taught during program session             Oxygen Re-Evaluation:   Oxygen Discharge (Final Oxygen Re-Evaluation):   Initial Exercise Prescription:  Initial Exercise Prescription - 11/04/22 1200       Date of Initial Exercise RX and Referring Provider   Date 11/04/22    Referring Provider Ramaswamy     Expected Discharge Date 02/02/23      Elliptical   Level 1    Speed 60    Minutes 15    METs 2      Rower   Level 1    Watts 15    Minutes 15      Prescription Details   Frequency (times per week) 2    Duration Progress to 30 minutes of continuous aerobic without signs/symptoms of physical distress      Intensity   THRR 40-80% of Max Heartrate 57-114    Ratings of Perceived Exertion 11-13    Perceived Dyspnea 0-4      Progression   Progression Continue to progress workloads to maintain intensity without signs/symptoms of physical distress.      Resistance Training   Training Prescription Yes    Weight --   red bands   Reps 10-15             Perform Capillary Blood Glucose checks as needed.  Exercise Prescription Changes:   Exercise Comments:   Exercise Goals and Review:   Exercise Goals     Row Name 11/04/22 1158             Exercise Goals   Increase Physical Activity Yes       Intervention Provide advice, education, support and counseling about physical activity/exercise needs.;Develop an individualized exercise prescription for aerobic and resistive training based on initial evaluation findings, risk stratification, comorbidities and participant's personal goals.       Expected Outcomes Short Term: Attend rehab on a regular basis to increase amount of physical activity.;Long Term: Exercising regularly at least 3-5 days a week.;Long Term: Add in home exercise to make exercise part of routine and to increase amount of physical activity.       Increase Strength and Stamina Yes       Intervention Provide advice, education, support and counseling about physical activity/exercise needs.;Develop an individualized exercise prescription for aerobic and resistive training based on initial evaluation findings, risk stratification, comorbidities and participant's personal goals.       Expected Outcomes Short Term: Increase workloads from initial exercise  prescription for resistance, speed, and METs.;Short Term: Perform resistance training exercises routinely during rehab and add in resistance training at home;Long Term: Improve cardiorespiratory fitness, muscular endurance and strength as measured by increased METs and functional capacity ( )       Able to understand  and use rate of perceived exertion (RPE) scale Yes       Intervention Provide education and explanation on how to use RPE scale       Expected Outcomes Short Term: Able to use RPE daily in rehab to express subjective intensity level;Long Term:  Able to use RPE to guide intensity level when exercising independently       Able to understand and use Dyspnea scale Yes       Intervention Provide education and explanation on how to use Dyspnea scale       Expected Outcomes Short Term: Able to use Dyspnea scale daily in rehab to express subjective sense of shortness of breath during exertion;Long Term: Able to use Dyspnea scale to guide intensity level when exercising independently       Knowledge and understanding of Target Heart Rate Range (THRR) Yes       Intervention Provide education and explanation of THRR including how the numbers were predicted and where they are located for reference       Expected Outcomes Short Term: Able to state/look up THRR;Long Term: Able to use THRR to govern intensity when exercising independently;Short Term: Able to use daily as guideline for intensity in rehab       Understanding of Exercise Prescription Yes       Intervention Provide education, explanation, and written materials on patient's individual exercise prescription       Expected Outcomes Short Term: Able to explain program exercise prescription;Long Term: Able to explain home exercise prescription to exercise independently                Exercise Goals Re-Evaluation :   Discharge Exercise Prescription (Final Exercise Prescription Changes):   Nutrition:  Target Goals: Understanding of  nutrition guidelines, daily intake of sodium 1500mg , cholesterol 200mg , calories 30% from fat and 7% or less from saturated fats, daily to have 5 or more servings of fruits and vegetables.  Biometrics:  Pre Biometrics - 11/04/22 1216       Pre Biometrics   Grip Strength 40 kg              Nutrition Therapy Plan and Nutrition Goals:   Nutrition Assessments:  MEDIFICTS Score Key: ?70 Need to make dietary changes  40-70 Heart Healthy Diet ? 40 Therapeutic Level Cholesterol Diet   Picture Your Plate Scores: <16 Unhealthy dietary pattern with much room for improvement. 41-50 Dietary pattern unlikely to meet recommendations for good health and room for improvement. 51-60 More healthful dietary pattern, with some room for improvement.  >60 Healthy dietary pattern, although there may be some specific behaviors that could be improved.    Nutrition Goals Re-Evaluation:   Nutrition Goals Discharge (Final Nutrition Goals Re-Evaluation):   Psychosocial: Target Goals: Acknowledge presence or absence of significant depression and/or stress, maximize coping skills, provide positive support system. Participant is able to verbalize types and ability to use techniques and skills needed for reducing stress and depression.  Initial Review & Psychosocial Screening:  Initial Psych Review & Screening - 11/04/22 1102       Initial Review   Current issues with Current Stress Concerns    Source of Stress Concerns Family    Comments [name]'s stress comes from being his wifes caretaker during her cancer diagnosis. He also sold his home of 24yrs to move into an assisted living facility. He is managing his stress well.      Family Dynamics   Good Support System? Yes  Barriers   Psychosocial barriers to participate in program The patient should benefit from training in stress management and relaxation.      Screening Interventions   Interventions Encouraged to exercise    Expected  Outcomes Long Term Goal: Stressors or current issues are controlled or eliminated.             Quality of Life Scores:  Scores of 19 and below usually indicate a poorer quality of life in these areas.  A difference of  2-3 points is a clinically meaningful difference.  A difference of 2-3 points in the total score of the Quality of Life Index has been associated with significant improvement in overall quality of life, self-image, physical symptoms, and general health in studies assessing change in quality of life.  PHQ-9: Review Flowsheet  More data exists      11/04/2022 04/19/2022 09/20/2021 04/05/2021 12/11/2020  Depression screen PHQ 2/9  Decreased Interest 0 0 0 0 1  Down, Depressed, Hopeless 0 0 0 0 0  PHQ - 2 Score 0 0 0 0 1  Altered sleeping 0 0 - - -  Tired, decreased energy 1 0 - - -  Change in appetite 0 0 - - -  Feeling bad or failure about yourself  0 0 - - -  Trouble concentrating 0 0 - - -  Moving slowly or fidgety/restless 0 0 - - -  Suicidal thoughts 0 0 - - -  PHQ-9 Score 1 0 - - -  Difficult doing work/chores Somewhat difficult Not difficult at all - - -   Interpretation of Total Score  Total Score Depression Severity:  1-4 = Minimal depression, 5-9 = Mild depression, 10-14 = Moderate depression, 15-19 = Moderately severe depression, 20-27 = Severe depression   Psychosocial Evaluation and Intervention:  Psychosocial Evaluation - 11/04/22 1106       Psychosocial Evaluation & Interventions   Interventions Encouraged to exercise with the program and follow exercise prescription    Comments [name] is currently taking care of his wife who has hodgkins lymphoma. He manages stress well.    Expected Outcomes For [name] to participate in PR free of any psychosocial barriers or concerns.    Continue Psychosocial Services  No Follow up required             Psychosocial Re-Evaluation:   Psychosocial Discharge (Final Psychosocial  Re-Evaluation):   Education: Education Goals: Education classes will be provided on a weekly basis, covering required topics. Participant will state understanding/return demonstration of topics presented.  Learning Barriers/Preferences:  Learning Barriers/Preferences - 11/04/22 1107       Learning Barriers/Preferences   Learning Barriers Sight   has hard time remembering things   Learning Preferences Group Instruction;Skilled Demonstration             Education Topics: Introduction to Pulmonary Rehab Group instruction provided by PowerPoint, verbal discussion, and written material to support subject matter. Instructor reviews what Pulmonary Rehab is, the purpose of the program, and how patients are referred.     Know Your Numbers Group instruction that is supported by a PowerPoint presentation. Instructor discusses importance of knowing and understanding resting, exercise, and post-exercise oxygen saturation, heart rate, and blood pressure. Oxygen saturation, heart rate, blood pressure, rating of perceived exertion, and dyspnea are reviewed along with a normal range for these values.    Exercise for the Pulmonary Patient Group instruction that is supported by a PowerPoint presentation. Instructor discusses benefits of exercise, core components of  exercise, frequency, duration, and intensity of an exercise routine, importance of utilizing pulse oximetry during exercise, safety while exercising, and options of places to exercise outside of rehab.       MET Level  Group instruction provided by PowerPoint, verbal discussion, and written material to support subject matter. Instructor reviews what METs are and how to increase METs.    Pulmonary Medications Verbally interactive group education provided by instructor with focus on inhaled medications and proper administration.   Anatomy and Physiology of the Respiratory System Group instruction provided by PowerPoint, verbal  discussion, and written material to support subject matter. Instructor reviews respiratory cycle and anatomical components of the respiratory system and their functions. Instructor also reviews differences in obstructive and restrictive respiratory diseases with examples of each.    Oxygen Safety Group instruction provided by PowerPoint, verbal discussion, and written material to support subject matter. There is an overview of "What is Oxygen" and "Why do we need it".  Instructor also reviews how to create a safe environment for oxygen use, the importance of using oxygen as prescribed, and the risks of noncompliance. There is a brief discussion on traveling with oxygen and resources the patient may utilize.   Oxygen Use Group instruction provided by PowerPoint, verbal discussion, and written material to discuss how supplemental oxygen is prescribed and different types of oxygen supply systems. Resources for more information are provided.    Breathing Techniques Group instruction that is supported by demonstration and informational handouts. Instructor discusses the benefits of pursed lip and diaphragmatic breathing and detailed demonstration on how to perform both.     Risk Factor Reduction Group instruction that is supported by a PowerPoint presentation. Instructor discusses the definition of a risk factor, different risk factors for pulmonary disease, and how the heart and lungs work together.   MD Day A group question and answer session with a medical doctor that allows participants to ask questions that relate to their pulmonary disease state.   Nutrition for the Pulmonary Patient Group instruction provided by PowerPoint slides, verbal discussion, and written materials to support subject matter. The instructor gives an explanation and review of healthy diet recommendations, which includes a discussion on weight management, recommendations for fruit and vegetable consumption, as well as  protein, fluid, caffeine, fiber, sodium, sugar, and alcohol. Tips for eating when patients are short of breath are discussed.    Other Education Group or individual verbal, written, or video instructions that support the educational goals of the pulmonary rehab program.    Knowledge Questionnaire Score:   Core Components/Risk Factors/Patient Goals at Admission:  Personal Goals and Risk Factors at Admission - 11/04/22 1108       Core Components/Risk Factors/Patient Goals on Admission    Weight Management Weight Maintenance    Improve shortness of breath with ADL's Yes    Intervention Provide education, individualized exercise plan and daily activity instruction to help decrease symptoms of SOB with activities of daily living.    Expected Outcomes Long Term: Be able to perform more ADLs without symptoms or delay the onset of symptoms;Short Term: Improve cardiorespiratory fitness to achieve a reduction of symptoms when performing ADLs             Core Components/Risk Factors/Patient Goals Review:    Core Components/Risk Factors/Patient Goals at Discharge (Final Review):    ITP Comments:   Comments: Dr. Mechele Collin is Medical Director for Pulmonary Rehab at [facility].

## 2022-11-04 NOTE — Progress Notes (Signed)
Troy Willis 77 y.o. male Pulmonary Rehab Orientation Note This patient who was referred to Pulmonary Rehab by Dr. Marchelle Gearing with the diagnosis of ILD arrived today in Cardiac and Pulmonary Rehab. He arrived ambulatory with normal gait. He does not carry portable oxygen.  Per patient, Troy Willis uses oxygen never. Color good, skin warm and dry. Patient is oriented to time and place. Patient's medical history, psychosocial health, and medications reviewed. Psychosocial assessment reveals patient lives with spouse. Troy Willis is currently retired. Patient hobbies include  hiking and shooting guns . Patient reports his stress level is low. Areas of stress/anxiety include health and family . Patient does not exhibit signs of depression. PHQ2/9 score 0/1. Troy Willis shows good  coping skills with positive outlook on life. Offered emotional support and reassurance. Will continue to monitor. Physical assessment performed by Nurse pick: Essie Hart RN. Please see their orientation physical assessment note. Kartel reports he  does take medications as prescribed. Patient states he  follows a regular  diet. The patient reports no specific efforts to gain or lose weight.. Patient's weight will be monitored closely. Demonstration and practice of PLB using pulse oximeter. Troy Willis able to return demonstration satisfactorily. Safety and hand hygiene in the exercise area reviewed with patient. Troy Willis voices understanding of the information reviewed. Department expectations discussed with patient and achievable goals were set. The patient shows enthusiasm about attending the program and we look forward to working with Troy Willis. [name] completed a 6 min walk test today and is scheduled to begin exercise on 11/10/22 @ 10:15.   [id number] Troy Willis, BSRT

## 2022-11-04 NOTE — Progress Notes (Signed)
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 Pt states he keeps a log of daily events to remember.  Heart rate is normal, breath sounds inspiratory wheezes throughout, cleared with coughing, crackles to LLL. Reports intermittent productive cough. Bowel sounds present x4 quads.  Pt denies abdominal discomfort, nausea, vomiting, diarrhea or constipation. Grip strength equal, strong. Distal pulses +2; no swelling to lower extremities, but pt states his legs swell at night.   Essie Hart, RN, BSN

## 2022-11-06 LAB — QUANTIFERON-TB GOLD PLUS: TB2-NIL: 0 IU/mL

## 2022-11-07 LAB — ALDOLASE: Aldolase: 3.2 U/L (ref <=8.1)

## 2022-11-07 LAB — QUANTIFERON-TB GOLD PLUS
Mitogen-NIL: 7.83 IU/mL
NIL: 0.02 IU/mL
QuantiFERON-TB Gold Plus: NEGATIVE
TB1-NIL: 0 IU/mL

## 2022-11-07 LAB — RHEUMATOID FACTOR: Rheumatoid fact SerPl-aCnc: 28 IU/mL — ABNORMAL HIGH (ref <14)

## 2022-11-07 LAB — IGE: IgE (Immunoglobulin E), Serum: 20 kU/L (ref <=114)

## 2022-11-07 LAB — CYCLIC CITRUL PEPTIDE ANTIBODY, IGG: Cyclic Citrullin Peptide Ab: <16 UNITS

## 2022-11-09 LAB — ANA+ENA+DNA/DS+SCL 70+SJOSSA/B
ANA Titer 1: POSITIVE — AB
ENA RNP Ab: <0.2 AI (ref 0.0–0.9)
ENA SM Ab Ser-aCnc: <0.2 AI (ref 0.0–0.9)
ENA SSA (RO) Ab: <0.2 AI (ref 0.0–0.9)
ENA SSB (LA) Ab: <0.2 AI (ref 0.0–0.9)
Scleroderma (Scl-70) (ENA) Antibody, IgG: <0.2 AI (ref 0.0–0.9)
dsDNA Ab: <1 IU/mL (ref 0–9)

## 2022-11-09 LAB — FANA STAINING PATTERNS: Speckled Pattern: 1:320 {titer} — ABNORMAL HIGH

## 2022-11-09 LAB — HYPERSENSITIVITY PNEUMONITIS
A. Pullulans Abs: NEGATIVE
A.Fumigatus #1 Abs: NEGATIVE
Micropolyspora faeni, IgG: NEGATIVE
Pigeon Serum Abs: NEGATIVE
Thermoact. Saccharii: NEGATIVE
Thermoactinomyces vulgaris, IgG: NEGATIVE

## 2022-11-10 ENCOUNTER — Encounter (HOSPITAL_COMMUNITY)
Admission: RE | Admit: 2022-11-10 | Discharge: 2022-11-10 | Disposition: A | Discharge status: Home / Self Care | Payer: PPO | Source: Ambulatory Visit | Attending: Internal Medicine | Admitting: Internal Medicine

## 2022-11-10 DIAGNOSIS — J849 Interstitial pulmonary disease, unspecified: Secondary | ICD-10-CM

## 2022-11-10 NOTE — Progress Notes (Signed)
Daily Session Note  Patient Details  Name: Troy Willis MRN: [id number] Date of Birth: 1945-10-07 Referring Provider:   Doristine Devoid Pulmonary Rehab Walk Test from 11/04/2022 in [facility]  Referring Provider Ramaswamy       Encounter Date: 11/10/2022  Check In:  Session Check In - 11/10/22 1032       Check-In   Supervising physician immediately available to respond to emergencies [facility] MD immediately available    Physician(s) Robin Searing, NP    Location [facility]    Staff Present Samantha Belarus, RD, LDN;Randi Dionisio Paschal, ACSM-CEP, Exercise Physiologist;Jeremiyah Cullens Gerre Scull, RN, Doris Cheadle, MS, ACSM-CEP, Exercise Physiologist;Casey Katrinka Blazing, RT    Virtual Visit No    Medication changes reported     No    Fall or balance concerns reported    No    Tobacco Cessation No Change    Warm-up and Cool-down Performed as group-led instruction    Resistance Training Performed Yes    VAD Patient? No    PAD/SET Patient? No      Pain Assessment   Currently in Pain? No/denies    Multiple Pain Sites No             Capillary Blood Glucose: No results found for this or any previous visit (from the past 24 hour(s)).    Social History   Tobacco Use  Smoking Status Never  Smokeless Tobacco Former   Types: Chew   Quit date: 05/16/1978  Tobacco Comments   occasional cigar in past; not regular smoker    Goals Met:  Exercise tolerated well No report of concerns or symptoms today Strength training completed today  Goals Unmet:  Not Applicable  Comments: Service time is from 1018 to 1155    Dr. Mechele Collin is Medical Director for Pulmonary Rehab at [facility].

## 2022-11-15 ENCOUNTER — Encounter (HOSPITAL_COMMUNITY)
Admission: RE | Admit: 2022-11-15 | Discharge: 2022-11-15 | Disposition: A | Discharge status: Home / Self Care | Payer: PPO | Source: Ambulatory Visit | Attending: Internal Medicine | Admitting: Internal Medicine

## 2022-11-15 DIAGNOSIS — J849 Interstitial pulmonary disease, unspecified: Secondary | ICD-10-CM | POA: Insufficient documentation

## 2022-11-15 NOTE — Progress Notes (Signed)
Daily Session Note  Patient Details  Name: Troy Willis MRN: [id number] Date of Birth: 1946/01/10 Referring Provider:   Doristine Devoid Pulmonary Rehab Walk Test from 11/04/2022 in [facility]  Referring Provider Ramaswamy       Encounter Date: 11/15/2022  Check In:  Session Check In - 11/15/22 1128       Check-In   Supervising physician immediately available to respond to emergencies [facility] MD immediately available    Physician(s) Robin Searing, NP    Location [facility]    Staff Present Samantha Belarus, RD, LDN;Randi Dionisio Paschal, ACSM-CEP, Exercise Physiologist;Mary Gerre Scull, RN, Doris Cheadle, MS, ACSM-CEP, Exercise Physiologist    Virtual Visit No    Medication changes reported     No    Fall or balance concerns reported    No    Tobacco Cessation No Change    Warm-up and Cool-down Performed as group-led instruction    Resistance Training Performed Yes    VAD Patient? No    PAD/SET Patient? No      Pain Assessment   Currently in Pain? No/denies             Capillary Blood Glucose: No results found for this or any previous visit (from the past 24 hour(s)).    Social History   Tobacco Use  Smoking Status Never  Smokeless Tobacco Former   Types: Chew   Quit date: 05/16/1978  Tobacco Comments   occasional cigar in past; not regular smoker    Goals Met:  Proper associated with RPD/PD & O2 Sat Independence with exercise equipment Exercise tolerated well No report of concerns or symptoms today Strength training completed today  Goals Unmet:  Not Applicable  Comments: Service time is from 1014 to 1338.    Dr. Mechele Collin is Medical Director for Pulmonary Rehab at [facility].

## 2022-11-18 DIAGNOSIS — G4733 Obstructive sleep apnea (adult) (pediatric): Secondary | ICD-10-CM | POA: Diagnosis not present

## 2022-11-20 ENCOUNTER — Encounter: Payer: Self-pay | Admitting: Pharmacist

## 2022-11-20 DIAGNOSIS — E7800 Pure hypercholesterolemia, unspecified: Secondary | ICD-10-CM

## 2022-11-22 ENCOUNTER — Encounter (HOSPITAL_COMMUNITY)
Admission: RE | Admit: 2022-11-22 | Discharge: 2022-11-22 | Disposition: A | Discharge status: Home / Self Care | Payer: PPO | Source: Ambulatory Visit | Attending: Internal Medicine | Admitting: Internal Medicine

## 2022-11-22 VITALS — Wt 160.7 lb

## 2022-11-22 DIAGNOSIS — J849 Interstitial pulmonary disease, unspecified: Secondary | ICD-10-CM | POA: Diagnosis not present

## 2022-11-22 NOTE — Progress Notes (Signed)
Daily Session Note  Patient Details  Name: Troy Willis MRN: [id number] Date of Birth: Apr 06, 1946 Referring Provider:   Doristine Devoid Pulmonary Rehab Walk Test from 11/04/2022 in [facility]  Referring Provider Ramaswamy       Encounter Date: 11/22/2022  Check In:  Session Check In - 11/22/22 1059       Check-In   Supervising physician immediately available to respond to emergencies [facility] MD immediately available    Physician(s) Carlyon Shadow, NP    Location [facility]    Staff Present Samantha Belarus, RD, Dutch Gray, RN, BSN;Dustyn Dansereau BS, ACSM-CEP, Exercise Physiologist;Kaylee Earlene Plater, MS, ACSM-CEP, Exercise Physiologist;Casey Katrinka Blazing, RT    Virtual Visit No    Medication changes reported     No    Fall or balance concerns reported    No    Tobacco Cessation No Change    Warm-up and Cool-down Performed as group-led instruction    Resistance Training Performed Yes    VAD Patient? No    PAD/SET Patient? No      Pain Assessment   Currently in Pain? No/denies    Multiple Pain Sites No             Capillary Blood Glucose: No results found for this or any previous visit (from the past 24 hour(s)).   Exercise Prescription Changes - 11/22/22 1200       Response to Exercise   Blood Pressure (Admit) 112/60    Blood Pressure (Exercise) 128/58    Blood Pressure (Exit) 100/56    Heart Rate (Admit) 61 bpm    Heart Rate (Exercise) 117 bpm    Heart Rate (Exit) 70 bpm    Oxygen Saturation (Admit) 99 %    Oxygen Saturation (Exercise) 92 %    Oxygen Saturation (Exit) 96 %    Rating of Perceived Exertion (Exercise) 15    Perceived Dyspnea (Exercise) 2    Duration Progress to 30 minutes of  aerobic without signs/symptoms of physical distress    Intensity THRR unchanged      Progression   Progression Continue to progress workloads to maintain intensity without signs/symptoms of physical  distress.      Resistance Training   Training Prescription Yes    Weight blue bands    Reps 10-15    Time 10 Minutes      Bike   Watts 50    Minutes 15      Rower   Level 4    Watts 55    Minutes 15             Social History   Tobacco Use  Smoking Status Never  Smokeless Tobacco Former   Types: Chew   Quit date: 05/16/1978  Tobacco Comments   occasional cigar in past; not regular smoker    Goals Met:  Independence with exercise equipment Exercise tolerated well No report of concerns or symptoms today Strength training completed today  Goals Unmet:  Not Applicable  Comments: Service time is from 1013 to 1145.    Dr. Mechele Collin is Medical Director for Pulmonary Rehab at [facility].

## 2022-11-23 NOTE — Progress Notes (Signed)
Pulmonary Individual Treatment Plan  Patient Details  Name: Troy Willis MRN: [id number] Date of Birth: [birth year] Referring Provider:   Doristine Devoid Pulmonary Rehab Walk Test from 11/04/2022 in [facility] for Heart, Vascular, & Lung Health  Referring Provider Ramaswamy       Initial Encounter Date:  Flowsheet Row Pulmonary Rehab Walk Test from 11/04/2022 in [facility] for Heart, Vascular, & Lung Health  Date 11/04/22       Visit Diagnosis: ILD (interstitial lung disease) (HCC)  Patient's Home Medications on Admission:   Current Outpatient Medications:    Bempedoic Acid-Ezetimibe (NEXLIZET) 180-10 MG TABS, Take 1 tablet by mouth daily., Disp: 90 tablet, Rfl: 3   celecoxib (CELEBREX) 50 MG capsule, Take 50 mg by mouth., Disp: , Rfl:    Cholecalciferol (VITAMIN D3) 2000 UNITS TABS, Take 2,000 Units by mouth daily. , Disp: , Rfl:    COVID-19 mRNA vaccine 2023-2024 (COMIRNATY) SUSP injection, Inject into the muscle., Disp: 0.3 mL, Rfl: 0   famotidine (PEPCID) 20 MG tablet, Take 20 mg by mouth daily as needed for heartburn or indigestion., Disp: , Rfl:    furosemide (LASIX) 20 MG tablet, Take 1 tablet (20 mg total) by mouth daily., Disp: 90 tablet, Rfl: 3   HYDROCODONE-GUAIFENESIN PO, Take by mouth., Disp: , Rfl:    hydrocortisone 2.5 % ointment, Apply 1 application topically 2 (two) times daily as needed (irritation). , Disp: , Rfl:    influenza vaccine adjuvanted (FLUAD QUADRIVALENT) 0.5 ML injection, Inject into the muscle., Disp: 0.5 mL, Rfl: 0   Melatonin 5 MG CAPS, Take 5 mg by mouth at bedtime., Disp: , Rfl:    nitroGLYCERIN (NITROSTAT) 0.4 MG SL tablet, Place 1 tablet (0.4 mg total) under the tongue every 5 (five) minutes x 3 doses as needed for chest pain., Disp: 25 tablet, Rfl: 3   Respiratory Therapy Supplies (FLUTTER) DEVI, Use as directed, Disp: 1 each, Rfl: 0   rivaroxaban (XARELTO) 20 MG TABS tablet, Take 1 tablet  (20 mg total) by mouth daily with supper., Disp: 90 tablet, Rfl: 1   RSV vaccine recomb adjuvanted (AREXVY) 120 MCG/0.5ML injection, Inject into the muscle., Disp: 0.5 mL, Rfl: 0   triamcinolone cream (KENALOG) 0.1 %, Apply 1 application topically daily as needed (rash). , Disp: , Rfl:  No current facility-administered medications for this encounter.  Facility-Administered Medications Ordered in Other Encounters:    gemcitabine (GEMZAR) chemo syringe for bladder instillation 2,000 mg, 2,000 mg, Bladder Instillation, Once, Bjorn Pippin, MD  Past Medical History: Past Medical History:  Diagnosis Date   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 08/2015 Cath/PCI: LM nl, LAD 60m, LCX small, nl, RCA 80p (4.0x16 Synergy DES),    Cancer (HCC)    skin on forehead   Cataracts, bilateral    surgery   Depression    pt denies    Diverticulosis    Dyspnea    occasional    GERD (gastroesophageal reflux disease)    Hilar adenopathy    a. 08/2015 CT Chest: mild bilat hilar adenopathy and mild soft tissue prominence in inf right hilum, left hilum. Also areas of soft tissue and low-density material filling LLL bronchial airways->?  mucous vs mass.   Hx of adenomatous colonic polyps 08/13/2015   Hyperlipidemia    MVP (mitral valve prolapse)    Mild to moderate MR by echo 07/2022   Myocardial infarction [facility])    Nonspecific elevation of levels of transaminase or lactic acid dehydrogenase (LDH)    PMH of ; ? 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 CHA2DS2VASc = 2 -->Xarelto started 08/2015.   Pneumonia, bacterial 05/18/2012   Pulmonary HTN (HCC)    PASP on echo 08/2019>>Likely Group 3 related to bronchiectasis>>repeat echo 09/2020 with PASP    Pulmonary nodules    a. 08/2015 CT Chest: reticulonodular opacities in RUL - largest 5mm - likely 2/2 inflammatory process.   Tricuspid regurgitation     Tobacco Use: Social History   Tobacco Use  Smoking Status Never  Smokeless Tobacco Former   Types: Chew   Quit date: 05/16/1978  Tobacco Comments   occasional cigar in past; not regular smoker    Labs: Review Flowsheet  More data exists      Latest Ref Rng & Units 01/12/2021 04/01/2021 07/12/2021 09/10/2021 09/28/2022  Labs for ITP Cardiac and Pulmonary Rehab  Cholestrol 0 - 200 mg/dL 161  096  045  409  811   LDL (calc) 0 - 99 mg/dL 75  54  95  64  914   HDL-C >40 mg/dL 69  64  65  63  61   Trlycerides <150 mg/dL 41  69  55  39  92     Capillary Blood Glucose: No results found for: "GLUCAP"   Pulmonary Assessment Scores:  Pulmonary Assessment Scores     Row Name 11/04/22 1159         ADL UCSD   ADL Phase Entry     SOB Score total 26       CAT Score   CAT Score 16       mMRC Score   mMRC Score 2             UCSD: Self-administered rating of dyspnea associated with activities of daily living (ADLs) 6-point scale (0 = "not at all" to 5 = "maximal or unable to do because of breathlessness")  Scoring Scores range from 0 to 120.  Minimally important difference is 5 units  CAT: CAT can identify the health impairment of COPD patients and is better correlated with disease progression.  CAT has a scoring range of zero to 40. The CAT score is classified into four groups of low (less than 10), medium (10 - 20), high (21-30) and very high (31-40) based on the impact level of disease on health status. A CAT score over 10 suggests significant symptoms.  A worsening CAT score could be explained by an exacerbation, poor medication adherence, poor inhaler technique, or progression of COPD or comorbid conditions.  CAT MCID is 2 points  mMRC: mMRC (Modified Medical Research Council) Dyspnea Scale is used to assess  the degree of baseline functional disability in patients of respiratory disease due to dyspnea. No minimal important difference is established. A decrease in score of 1 point or greater is considered a positive change.   Pulmonary Function Assessment:  Pulmonary Function Assessment - 11/04/22 1110       Breath   Bilateral Breath Sounds Rales;Basilar    Shortness of Breath Limiting activity;Yes             Exercise  Target Goals: Exercise Program Goal: Individual exercise prescription set using results from initial 6 min walk test and THRR while considering  patient's activity barriers and safety.   Exercise Prescription Goal: Initial exercise prescription builds to 30-45 minutes a day of aerobic activity, 2-3 days per week.  Home exercise guidelines will be given to patient during program as part of exercise prescription that the participant will acknowledge.  Activity Barriers & Risk Stratification:  Activity Barriers & Cardiac Risk Stratification - 11/04/22 1157       Activity Barriers & Cardiac Risk Stratification   Activity Barriers Arthritis;Muscular Weakness;Deconditioning;Shortness of Breath    Cardiac Risk Stratification Low             6 Minute Walk:  6 Minute Walk     Row Name 11/04/22 1159         6 Minute Walk   Distance 1825 feet     Walk Time 6 minutes     # of Rest Breaks 0     MPH 3.46     METS 3.99     RPE 13     Perceived Dyspnea  2     VO2 Peak 13.98     Symptoms No     Resting HR 63 bpm     Resting BP 110/58     Resting Oxygen Saturation  100 %     Exercise Oxygen Saturation  during 6 min walk 91 %     Max Ex. HR 112 bpm     Max Ex. BP 132/64     2 Minute Post BP 114/70       Interval HR   1 Minute HR 68     2 Minute HR 108     3 Minute HR 104     4 Minute HR 102     5 Minute HR 112     6 Minute HR 104     2 Minute Post HR 62     Interval Heart Rate? Yes       Interval Oxygen   Interval Oxygen? Yes     Baseline Oxygen  Saturation % 100 %     1 Minute Oxygen Saturation % 97 %     1 Minute Liters of Oxygen 0 L     2 Minute Oxygen Saturation % 95 %     2 Minute Liters of Oxygen 0 L     3 Minute Oxygen Saturation % 93 %     3 Minute Liters of Oxygen 0 L     4 Minute Oxygen Saturation % 91 %     4 Minute Liters of Oxygen 0 L     5 Minute Oxygen Saturation % 91 %     5 Minute Liters of Oxygen 0 L     6 Minute Oxygen Saturation % 92 %     6 Minute Liters of Oxygen 0 L     2 Minute Post Oxygen Saturation % 98 %     2 Minute Post Liters of Oxygen 0 L              Oxygen Initial Assessment:  Oxygen Initial Assessment - 11/04/22 1109       Home Oxygen   Home Oxygen Device None    Sleep Oxygen Prescription CPAP    Home Exercise Oxygen Prescription None    Home Resting Oxygen Prescription None      Initial 6 min Walk   Oxygen Used  None      Program Oxygen Prescription   Program Oxygen Prescription None      Intervention   Short Term Goals To learn and exhibit compliance with exercise, home and travel O2 prescription;To learn and understand importance of maintaining oxygen saturations>88%;To learn and understand importance of monitoring SPO2 with pulse oximeter and demonstrate accurate use of the pulse oximeter.;To learn and demonstrate proper pursed lip breathing techniques or other breathing techniques.     Long  Term Goals Exhibits compliance with exercise, home  and travel O2 prescription;Maintenance of O2 saturations>88%;Verbalizes importance of monitoring SPO2 with pulse oximeter and return demonstration;Exhibits proper breathing techniques, such as pursed lip breathing or other method taught during program session             Oxygen Re-Evaluation:  Oxygen Re-Evaluation     Row Name 11/18/22 0946             Program Oxygen Prescription   Program Oxygen Prescription None         Home Oxygen   Home Oxygen Device None       Sleep Oxygen Prescription CPAP       Home Exercise  Oxygen Prescription None       Home Resting Oxygen Prescription None         Goals/Expected Outcomes   Short Term Goals To learn and exhibit compliance with exercise, home and travel O2 prescription;To learn and understand importance of maintaining oxygen saturations>88%;To learn and understand importance of monitoring SPO2 with pulse oximeter and demonstrate accurate use of the pulse oximeter.;To learn and demonstrate proper pursed lip breathing techniques or other breathing techniques.        Long  Term Goals Exhibits compliance with exercise, home  and travel O2 prescription;Maintenance of O2 saturations>88%;Verbalizes importance of monitoring SPO2 with pulse oximeter and return demonstration;Exhibits proper breathing techniques, such as pursed lip breathing or other method taught during program session       Goals/Expected Outcomes Compliance and understanding of oxygen saturation monitoring and breathing techniques to decrease shortness of breath.                Oxygen Discharge (Final Oxygen Re-Evaluation):  Oxygen Re-Evaluation - 11/18/22 0946       Program Oxygen Prescription   Program Oxygen Prescription None      Home Oxygen   Home Oxygen Device None    Sleep Oxygen Prescription CPAP    Home Exercise Oxygen Prescription None    Home Resting Oxygen Prescription None      Goals/Expected Outcomes   Short Term Goals To learn and exhibit compliance with exercise, home and travel O2 prescription;To learn and understand importance of maintaining oxygen saturations>88%;To learn and understand importance of monitoring SPO2 with pulse oximeter and demonstrate accurate use of the pulse oximeter.;To learn and demonstrate proper pursed lip breathing techniques or other breathing techniques.     Long  Term Goals Exhibits compliance with exercise, home  and travel O2 prescription;Maintenance of O2 saturations>88%;Verbalizes importance of monitoring SPO2 with pulse oximeter and return  demonstration;Exhibits proper breathing techniques, such as pursed lip breathing or other method taught during program session    Goals/Expected Outcomes Compliance and understanding of oxygen saturation monitoring and breathing techniques to decrease shortness of breath.             Initial Exercise Prescription:  Initial Exercise Prescription - 11/04/22 1200       Date of Initial Exercise RX and Referring Provider   Date 11/04/22  Referring Provider Ramaswamy    Expected Discharge Date 02/02/23      Elliptical   Level 1    Speed 60    Minutes 15    METs 2      Rower   Level 1    Watts 15    Minutes 15      Prescription Details   Frequency (times per week) 2    Duration Progress to 30 minutes of continuous aerobic without signs/symptoms of physical distress      Intensity   THRR 40-80% of Max Heartrate 57-114    Ratings of Perceived Exertion 11-13    Perceived Dyspnea 0-4      Progression   Progression Continue to progress workloads to maintain intensity without signs/symptoms of physical distress.      Resistance Training   Training Prescription Yes    Weight --   red bands   Reps 10-15             Perform Capillary Blood Glucose checks as needed.  Exercise Prescription Changes:   Exercise Prescription Changes     Row Name 11/22/22 1200             Response to Exercise   Blood Pressure (Admit) 112/60       Blood Pressure (Exercise) 128/58       Blood Pressure (Exit) 100/56       Heart Rate (Admit) 61 bpm       Heart Rate (Exercise) 117 bpm       Heart Rate (Exit) 70 bpm       Oxygen Saturation (Admit) 99 %       Oxygen Saturation (Exercise) 92 %       Oxygen Saturation (Exit) 96 %       Rating of Perceived Exertion (Exercise) 15       Perceived Dyspnea (Exercise) 2       Duration Progress to 30 minutes of  aerobic without signs/symptoms of physical distress       Intensity THRR unchanged         Progression   Progression Continue to  progress workloads to maintain intensity without signs/symptoms of physical distress.         Resistance Training   Training Prescription Yes       Weight blue bands       Reps 10-15       Time 10 Minutes         Bike   Watts 50       Minutes 15         Rower   Level 4       Watts 55       Minutes 15                Exercise Comments:   Exercise Comments     Row Name 11/10/22 1135           Exercise Comments Pt completed his first day of group exercise. He exercised on the upright elliptical speed 4, incline 2, METs 4.8, for 15 min. He then exercised on the rower at level 2 for 15 min, avg watts 35. Pt tolerated well although more tired on the rower. He performed warm up and cool down without limitation, including squats. He is motivated. Discussed METs with good understanding.                Exercise Goals and Review:   Exercise Goals  Row Name 11/04/22 1158 11/18/22 0943           Exercise Goals   Increase Physical Activity Yes Yes      Intervention Provide advice, education, support and counseling about physical activity/exercise needs.;Develop an individualized exercise prescription for aerobic and resistive training based on initial evaluation findings, risk stratification, comorbidities and participant's personal goals. Provide advice, education, support and counseling about physical activity/exercise needs.;Develop an individualized exercise prescription for aerobic and resistive training based on initial evaluation findings, risk stratification, comorbidities and participant's personal goals.      Expected Outcomes Short Term: Attend rehab on a regular basis to increase amount of physical activity.;Long Term: Exercising regularly at least 3-5 days a week.;Long Term: Add in home exercise to make exercise part of routine and to increase amount of physical activity. Short Term: Attend rehab on a regular basis to increase amount of physical activity.;Long  Term: Exercising regularly at least 3-5 days a week.;Long Term: Add in home exercise to make exercise part of routine and to increase amount of physical activity.      Increase Strength and Stamina Yes Yes      Intervention Provide advice, education, support and counseling about physical activity/exercise needs.;Develop an individualized exercise prescription for aerobic and resistive training based on initial evaluation findings, risk stratification, comorbidities and participant's personal goals. Provide advice, education, support and counseling about physical activity/exercise needs.;Develop an individualized exercise prescription for aerobic and resistive training based on initial evaluation findings, risk stratification, comorbidities and participant's personal goals.      Expected Outcomes Short Term: Increase workloads from initial exercise prescription for resistance, speed, and METs.;Short Term: Perform resistance training exercises routinely during rehab and add in resistance training at home;Long Term: Improve cardiorespiratory fitness, muscular endurance and strength as measured by increased METs and functional capacity ( ) Short Term: Increase workloads from initial exercise prescription for resistance, speed, and METs.;Short Term: Perform resistance training exercises routinely during rehab and add in resistance training at home;Long Term: Improve cardiorespiratory fitness, muscular endurance and strength as measured by increased METs and functional capacity ( )      Able to understand and use rate of perceived exertion (RPE) scale Yes Yes      Intervention Provide education and explanation on how to use RPE scale Provide education and explanation on how to use RPE scale      Expected Outcomes Short Term: Able to use RPE daily in rehab to express subjective intensity level;Long Term:  Able to use RPE to guide intensity level when exercising independently Short Term: Able to use RPE daily in  rehab to express subjective intensity level;Long Term:  Able to use RPE to guide intensity level when exercising independently      Able to understand and use Dyspnea scale Yes Yes      Intervention Provide education and explanation on how to use Dyspnea scale Provide education and explanation on how to use Dyspnea scale      Expected Outcomes Short Term: Able to use Dyspnea scale daily in rehab to express subjective sense of shortness of breath during exertion;Long Term: Able to use Dyspnea scale to guide intensity level when exercising independently Short Term: Able to use Dyspnea scale daily in rehab to express subjective sense of shortness of breath during exertion;Long Term: Able to use Dyspnea scale to guide intensity level when exercising independently      Knowledge and understanding of Target Heart Rate Range (THRR) Yes Yes      Intervention  Provide education and explanation of THRR including how the numbers were predicted and where they are located for reference Provide education and explanation of THRR including how the numbers were predicted and where they are located for reference      Expected Outcomes Short Term: Able to state/look up THRR;Long Term: Able to use THRR to govern intensity when exercising independently;Short Term: Able to use daily as guideline for intensity in rehab Short Term: Able to state/look up THRR;Long Term: Able to use THRR to govern intensity when exercising independently;Short Term: Able to use daily as guideline for intensity in rehab      Understanding of Exercise Prescription Yes Yes      Intervention Provide education, explanation, and written materials on patient's individual exercise prescription Provide education, explanation, and written materials on patient's individual exercise prescription      Expected Outcomes Short Term: Able to explain program exercise prescription;Long Term: Able to explain home exercise prescription to exercise independently Short  Term: Able to explain program exercise prescription;Long Term: Able to explain home exercise prescription to exercise independently               Exercise Goals Re-Evaluation :  Exercise Goals Re-Evaluation     Row Name 11/18/22 0943             Exercise Goal Re-Evaluation   Exercise Goals Review Increase Physical Activity;Able to understand and use Dyspnea scale;Understanding of Exercise Prescription;Increase Strength and Stamina;Knowledge and understanding of Target Heart Rate Range (THRR);Able to understand and use rate of perceived exertion (RPE) scale       Comments [name] has completed 2 exercise sessions. He exercises for 15 min on the upright elliptical and rower. [name] averages 4.8 METs at 2 speed and 4 incline on the upright elliptical and 50 watts at level 4 on the rower. He performs the warmup and cooldown standing without limitations. It is too soon to note any discernable progressions. Will continue to monitor and progress as able.       Expected Outcomes Through exercise at rehab and home, the patient will decrease shortness of breath with daily activities and feel confident in carrying out an exercise regimen at home.                Discharge Exercise Prescription (Final Exercise Prescription Changes):  Exercise Prescription Changes - 11/22/22 1200       Response to Exercise   Blood Pressure (Admit) 112/60    Blood Pressure (Exercise) 128/58    Blood Pressure (Exit) 100/56    Heart Rate (Admit) 61 bpm    Heart Rate (Exercise) 117 bpm    Heart Rate (Exit) 70 bpm    Oxygen Saturation (Admit) 99 %    Oxygen Saturation (Exercise) 92 %    Oxygen Saturation (Exit) 96 %    Rating of Perceived Exertion (Exercise) 15    Perceived Dyspnea (Exercise) 2    Duration Progress to 30 minutes of  aerobic without signs/symptoms of physical distress    Intensity THRR unchanged      Progression   Progression Continue to progress workloads to maintain intensity without  signs/symptoms of physical distress.      Resistance Training   Training Prescription Yes    Weight blue bands    Reps 10-15    Time 10 Minutes      Bike   Watts 50    Minutes 15      Rower   Level 4  Watts 55    Minutes 15             Nutrition:  Target Goals: Understanding of nutrition guidelines, daily intake of sodium 1500mg , cholesterol 200mg , calories 30% from fat and 7% or less from saturated fats, daily to have 5 or more servings of fruits and vegetables.  Biometrics:  Pre Biometrics - 11/04/22 1216       Pre Biometrics   Grip Strength 40 kg              Nutrition Therapy Plan and Nutrition Goals:  Nutrition Therapy & Goals - 11/10/22 1105       Nutrition Therapy   Diet Heart Healthy Diet      Personal Nutrition Goals   Nutrition Goal Patient to limit sodium 2300mg  per day    Personal Goal #2 Patient to maintain weight or identify strategies for weight gain as needed of 0.5-2.0# while enrolled in pulmonary rehab.    Comments [name] lives at [facility] assisted [facility] with his wife. He is provided lunch and dinner daily and does eat breakfast in his room daily. He reports stable appetite and weight at this time; he is not interested in weight gain at this time. He recently stopped salting his food approximately two weeks ago and has decreased high salt snacks (saltines, chips, etc). [name] will continue to benefit from participation in pulmonary rehab for nutrition, exercise, and lifestyle changes.      Intervention Plan   Intervention Prescribe, educate and counsel regarding individualized specific dietary modifications aiming towards targeted core components such as weight, hypertension, lipid management, diabetes, heart failure and other comorbidities.;Nutrition handout(s) given to patient.    Expected Outcomes Short Term Goal: Understand basic principles of dietary content, such as calories, fat, sodium, cholesterol and nutrients.;Long Term Goal:  Adherence to prescribed nutrition plan.             Nutrition Assessments:  Nutrition Assessments - 11/10/22 1624       Rate Your Plate Scores   Pre Score 51            MEDIFICTS Score Key: ?70 Need to make dietary changes  40-70 Heart Healthy Diet ? 40 Therapeutic Level Cholesterol Diet  Flowsheet Row PULMONARY REHAB OTHER RESPIRATORY from 11/10/2022 in [facility], [facility] & Lung [facility]  Picture Your Plate Total Score on Admission 51      Picture Your Plate Scores: <16 Unhealthy dietary pattern with much room for improvement. 41-50 Dietary pattern unlikely to meet recommendations for good health and room for improvement. 51-60 More healthful dietary pattern, with some room for improvement.  >60 Healthy dietary pattern, although there may be some specific behaviors that could be improved.    Nutrition Goals Re-Evaluation:  Nutrition Goals Re-Evaluation     Row Name 11/10/22 1105             Goals   Current Weight 157 lb 3 oz (71.3 kg)       Comment LDL 101, ferritin WNL       Expected Outcome [name] lives at [facility] assisted [facility] with his wife. He is provided lunch and dinner daily and does eat breakfast in his room daily. He reports stable appetite and weight at this time; he is not interested in weight gain at this time. He recently stopped salting his food approximately two weeks ago and has decreased high salt snacks (saltines, chips, etc). [name] will continue to benefit from participation in pulmonary  rehab for nutrition, exercise, and lifestyle changes.                Nutrition Goals Discharge (Final Nutrition Goals Re-Evaluation):  Nutrition Goals Re-Evaluation - 11/10/22 1105       Goals   Current Weight 157 lb 3 oz (71.3 kg)    Comment LDL 101, ferritin WNL    Expected Outcome [name] lives at [facility] assisted living with his wife. He is provided lunch and dinner daily and does eat breakfast in his room daily.  He reports stable appetite and weight at this time; he is not interested in weight gain at this time. He recently stopped salting his food approximately two weeks ago and has decreased high salt snacks (saltines, chips, etc). [name] will continue to benefit from participation in pulmonary rehab for nutrition, exercise, and lifestyle changes.             Psychosocial: Target Goals: Acknowledge presence or absence of significant depression and/or stress, maximize coping skills, provide positive support system. Participant is able to verbalize types and ability to use techniques and skills needed for reducing stress and depression.  Initial Review & Psychosocial Screening:  Initial Psych Review & Screening - 11/04/22 1102       Initial Review   Current issues with Current Stress Concerns    Source of Stress Concerns Family    Comments [name]'s stress comes from being his wifes caretaker during her cancer diagnosis. He also sold his home of 51yrs to move into an assisted living facility. He is managing his stress well.      Family Dynamics   Good Support System? Yes      Barriers   Psychosocial barriers to participate in program The patient should benefit from training in stress management and relaxation.      Screening Interventions   Interventions Encouraged to exercise    Expected Outcomes Long Term Goal: Stressors or current issues are controlled or eliminated.             Quality of Life Scores:  Scores of 19 and below usually indicate a poorer quality of life in these areas.  A difference of  2-3 points is a clinically meaningful difference.  A difference of 2-3 points in the total score of the Quality of Life Index has been associated with significant improvement in overall quality of life, self-image, physical symptoms, and general health in studies assessing change in quality of life.  PHQ-9: Review Flowsheet  More data exists      11/04/2022 04/19/2022 09/20/2021 04/05/2021  12/11/2020  Depression screen PHQ 2/9  Decreased Interest 0 0 0 0 1  Down, Depressed, Hopeless 0 0 0 0 0  PHQ - 2 Score 0 0 0 0 1  Altered sleeping 0 0 - - -  Tired, decreased energy 1 0 - - -  Change in appetite 0 0 - - -  Feeling bad or failure about yourself  0 0 - - -  Trouble concentrating 0 0 - - -  Moving slowly or fidgety/restless 0 0 - - -  Suicidal thoughts 0 0 - - -  PHQ-9 Score 1 0 - - -  Difficult doing work/chores Somewhat difficult Not difficult at all - - -   Interpretation of Total Score  Total Score Depression Severity:  1-4 = Minimal depression, 5-9 = Mild depression, 10-14 = Moderate depression, 15-19 = Moderately severe depression, 20-27 = Severe depression   Psychosocial Evaluation and Intervention:  Psychosocial  Evaluation - 11/04/22 1106       Psychosocial Evaluation & Interventions   Interventions Encouraged to exercise with the program and follow exercise prescription    Comments [name] is currently taking care of his wife who has hodgkins lymphoma. He manages stress well.    Expected Outcomes For [name] to participate in PR free of any psychosocial barriers or concerns.    Continue Psychosocial Services  No Follow up required             Psychosocial Re-Evaluation:  Psychosocial Re-Evaluation     Row Name 11/21/22 0840             Psychosocial Re-Evaluation   Current issues with Current Stress Concerns       Comments [name] has recently started the program. He is still dealing with the stress of his wifes illness. Staff will educate [name] on health ways to deal with his stress.       Expected Outcomes For [name] to participate in PR free of any psychosocial barriers or concerns.       Interventions Encouraged to attend Pulmonary Rehabilitation for the exercise       Continue Psychosocial Services  Follow up required by staff                Psychosocial Discharge (Final Psychosocial Re-Evaluation):  Psychosocial Re-Evaluation - 11/21/22 0840        Psychosocial Re-Evaluation   Current issues with Current Stress Concerns    Comments [name] has recently started the program. He is still dealing with the stress of his wifes illness. Staff will educate [name] on health ways to deal with his stress.    Expected Outcomes For [name] to participate in PR free of any psychosocial barriers or concerns.    Interventions Encouraged to attend Pulmonary Rehabilitation for the exercise    Continue Psychosocial Services  Follow up required by staff             Education: Education Goals: Education classes will be provided on a weekly basis, covering required topics. Participant will state understanding/return demonstration of topics presented.  Learning Barriers/Preferences:  Learning Barriers/Preferences - 11/04/22 1107       Learning Barriers/Preferences   Learning Barriers Sight   has hard time remembering things   Learning Preferences Group Instruction;Skilled Demonstration             Education Topics: Introduction to Pulmonary Rehab Group instruction provided by PowerPoint, verbal discussion, and written material to support subject matter. Instructor reviews what Pulmonary Rehab is, the purpose of the program, and how patients are referred.     Know Your Numbers Group instruction that is supported by a PowerPoint presentation. Instructor discusses importance of knowing and understanding resting, exercise, and post-exercise oxygen saturation, heart rate, and blood pressure. Oxygen saturation, heart rate, blood pressure, rating of perceived exertion, and dyspnea are reviewed along with a normal range for these values.    Exercise for the Pulmonary Patient Group instruction that is supported by a PowerPoint presentation. Instructor discusses benefits of exercise, core components of exercise, frequency, duration, and intensity of an exercise routine, importance of utilizing pulse oximetry during exercise, safety while exercising, and options  of places to exercise outside of rehab.  Flowsheet Row PULMONARY REHAB OTHER RESPIRATORY from 11/10/2022 in [facility]  Date 11/10/22  Educator EP  Instruction Review Code 1- Verbalizes Understanding  MET Level  Group instruction provided by PowerPoint, verbal discussion, and written material to support subject matter. Instructor reviews what METs are and how to increase METs.    Pulmonary Medications Verbally interactive group education provided by instructor with focus on inhaled medications and proper administration.   Anatomy and Physiology of the Respiratory System Group instruction provided by PowerPoint, verbal discussion, and written material to support subject matter. Instructor reviews respiratory cycle and anatomical components of the respiratory system and their functions. Instructor also reviews differences in obstructive and restrictive respiratory diseases with examples of each.    Oxygen Safety Group instruction provided by PowerPoint, verbal discussion, and written material to support subject matter. There is an overview of "What is Oxygen" and "Why do we need it".  Instructor also reviews how to create a safe environment for oxygen use, the importance of using oxygen as prescribed, and the risks of noncompliance. There is a brief discussion on traveling with oxygen and resources the patient may utilize.   Oxygen Use Group instruction provided by PowerPoint, verbal discussion, and written material to discuss how supplemental oxygen is prescribed and different types of oxygen supply systems. Resources for more information are provided.    Breathing Techniques Group instruction that is supported by demonstration and informational handouts. Instructor discusses the benefits of pursed lip and diaphragmatic breathing and detailed demonstration on how to perform both.     Risk Factor Reduction Group  instruction that is supported by a PowerPoint presentation. Instructor discusses the definition of a risk factor, different risk factors for pulmonary disease, and how the heart and lungs work together.   MD Day A group question and answer session with a medical doctor that allows participants to ask questions that relate to their pulmonary disease state.   Nutrition for the Pulmonary Patient Group instruction provided by PowerPoint slides, verbal discussion, and written materials to support subject matter. The instructor gives an explanation and review of healthy diet recommendations, which includes a discussion on weight management, recommendations for fruit and vegetable consumption, as well as protein, fluid, caffeine, fiber, sodium, sugar, and alcohol. Tips for eating when patients are short of breath are discussed.    Other Education Group or individual verbal, written, or video instructions that support the educational goals of the pulmonary rehab program.    Knowledge Questionnaire Score:   Core Components/Risk Factors/Patient Goals at Admission:  Personal Goals and Risk Factors at Admission - 11/04/22 1108       Core Components/Risk Factors/Patient Goals on Admission    Weight Management Weight Maintenance    Improve shortness of breath with ADL's Yes    Intervention Provide education, individualized exercise plan and daily activity instruction to help decrease symptoms of SOB with activities of daily living.    Expected Outcomes Long Term: Be able to perform more ADLs without symptoms or delay the onset of symptoms;Short Term: Improve cardiorespiratory fitness to achieve a reduction of symptoms when performing ADLs             Core Components/Risk Factors/Patient Goals Review:   Goals and Risk Factor Review     Row Name 11/21/22 0846             Core Components/Risk Factors/Patient Goals Review   Personal Goals Review Improve shortness of breath with ADL's        Review [name] has recently started the program. He is able to demonstrate purse lip breathing when he gets short of breath. He is still working on  the goal to improve his shortness of breath. [name] is enjoying the program so far. We will continue to monitor his progress.       Expected Outcomes See admission goals                Core Components/Risk Factors/Patient Goals at Discharge (Final Review):   Goals and Risk Factor Review - 11/21/22 0846       Core Components/Risk Factors/Patient Goals Review   Personal Goals Review Improve shortness of breath with ADL's    Review [name] has recently started the program. He is able to demonstrate purse lip breathing when he gets short of breath. He is still working on the goal to improve his shortness of breath. [name] is enjoying the program so far. We will continue to monitor his progress.    Expected Outcomes See admission goals             ITP Comments: Pt is making expected progress toward Pulmonary Rehab goals after completing 3 sessions. Recommend continued exercise, life style modification, education, and utilization of breathing techniques to increase stamina and strength, while also decreasing shortness of breath with exertion.  Dr. Mechele Collin is Medical Director for Pulmonary Rehab at [facility].

## 2022-11-24 ENCOUNTER — Encounter (HOSPITAL_COMMUNITY)
Admission: RE | Admit: 2022-11-24 | Discharge: 2022-11-24 | Disposition: A | Discharge status: Home / Self Care | Payer: PPO | Source: Ambulatory Visit | Attending: Internal Medicine | Admitting: Internal Medicine

## 2022-11-24 DIAGNOSIS — J849 Interstitial pulmonary disease, unspecified: Secondary | ICD-10-CM

## 2022-11-24 NOTE — Progress Notes (Signed)
Daily Session Note  Patient Details  Name: Troy Willis MRN: [id number] Date of Birth: 08-Dec-1945 Referring Provider:   Doristine Devoid Pulmonary Rehab Walk Test from 11/04/2022 in [facility] for [facility], Vascular, & Lung Health  Referring Provider Ramaswamy       Encounter Date: 11/24/2022  Check In:  Session Check In - 11/24/22 1049       Check-In   Supervising physician immediately available to respond to emergencies [facility] MD immediately available    Physician(s) Micah Flesher, NP    Location [facility]    Staff Present Essie Hart, RN, BSN;Stacey Sago Idelle Crouch BS, ACSM-CEP, Exercise Physiologist;Kaylee Earlene Plater, MS, ACSM-CEP, Exercise Physiologist;Casey Katrinka Blazing, RT    Virtual Visit No    Medication changes reported     No    Fall or balance concerns reported    No    Tobacco Cessation No Change    Warm-up and Cool-down Performed as group-led instruction    Resistance Training Performed Yes    VAD Patient? No    PAD/SET Patient? No      Pain Assessment   Currently in Pain? No/denies    Multiple Pain Sites No             Capillary Blood Glucose: No results found for this or any previous visit (from the past 24 hour(s)).    Social History   Tobacco Use  Smoking Status Never  Smokeless Tobacco Former   Types: Chew   Quit date: 05/16/1978  Tobacco Comments   occasional cigar in past; not regular smoker    Goals Met:  Independence with exercise equipment Exercise tolerated well No report of concerns or symptoms today Strength training completed today  Goals Unmet:  Not Applicable  Comments: Service time is from 1022 to 1155.    Dr. Mechele Collin is Medical Director for Pulmonary Rehab at [facility].

## 2022-11-29 ENCOUNTER — Encounter (HOSPITAL_COMMUNITY): Admission: RE | Admit: 2022-11-29 | Payer: PPO | Source: Ambulatory Visit

## 2022-11-29 DIAGNOSIS — J849 Interstitial pulmonary disease, unspecified: Secondary | ICD-10-CM | POA: Diagnosis not present

## 2022-11-29 NOTE — Progress Notes (Signed)
Daily Session Note  Patient Details  Name: Troy Willis MRN: [id number] Date of Birth: August 17, 1945 Referring Provider:   Doristine Devoid Pulmonary Rehab Walk Test from 11/04/2022 in [facility] for Heart, Vascular, & Lung Health  Referring Provider Ramaswamy       Encounter Date: 11/29/2022  Check In:  Session Check In - 11/29/22 1101       Check-In   Supervising physician immediately available to respond to emergencies [facility] MD immediately available    Physician(s) Edd Fabian, NP    [facility]    Staff Present Samantha Belarus, RD, Dutch Gray, RN, BSN;Randi Reeve BS, ACSM-CEP, Exercise Physiologist;Kaylee Earlene Plater, MS, ACSM-CEP, Exercise Physiologist;Andreina Outten Katrinka Blazing, RT    Virtual Visit No    Medication changes reported     No    Fall or balance concerns reported    No    Tobacco Cessation No Change    Warm-up and Cool-down Performed as group-led instruction    Resistance Training Performed Yes    VAD Patient? No    PAD/SET Patient? No      Pain Assessment   Currently in Pain? No/denies    Multiple Pain Sites No             Capillary Blood Glucose: No results found for this or any previous visit (from the past 24 hour(s)).    Social History   Tobacco Use  Smoking Status Never  Smokeless Tobacco Former   Types: Chew   Quit date: 05/16/1978  Tobacco Comments   occasional cigar in past; not regular smoker    Goals Met:  Proper associated with RPD/PD & O2 Sat Independence with exercise equipment Exercise tolerated well No report of concerns or symptoms today Strength training completed today  Goals Unmet:  Not Applicable  Comments: Service time is from 1024 to 1130.    Dr. Mechele Collin is Medical Director for Pulmonary Rehab at [facility].

## 2022-11-30 ENCOUNTER — Ambulatory Visit: Payer: PPO | Admitting: Pulmonary Disease

## 2022-11-30 DIAGNOSIS — E7800 Pure hypercholesterolemia, unspecified: Secondary | ICD-10-CM | POA: Diagnosis not present

## 2022-12-01 ENCOUNTER — Encounter (HOSPITAL_COMMUNITY)
Admission: RE | Admit: 2022-12-01 | Discharge: 2022-12-01 | Disposition: A | Discharge status: Home / Self Care | Payer: PPO | Source: Ambulatory Visit | Attending: Internal Medicine | Admitting: Internal Medicine

## 2022-12-01 DIAGNOSIS — J849 Interstitial pulmonary disease, unspecified: Secondary | ICD-10-CM

## 2022-12-01 LAB — LIPID PANEL
Chol/HDL Ratio: 2.4 ratio (ref 0.0–5.0)
Cholesterol, Total: 135 mg/dL (ref 100–199)
HDL: 57 mg/dL (ref >39)
LDL Chol Calc (NIH): 66 mg/dL (ref 0–99)
Triglycerides: 54 mg/dL (ref 0–149)
VLDL Cholesterol Cal: 12 mg/dL (ref 5–40)

## 2022-12-01 LAB — URIC ACID: Uric Acid: 6.4 mg/dL (ref 3.8–8.4)

## 2022-12-01 NOTE — Progress Notes (Signed)
Daily Session Note  Patient Details  Name: Troy Willis MRN: [id number] Date of Birth: 1945-05-18 Referring Provider:   Doristine Devoid Pulmonary Rehab Walk Test from 11/04/2022 in [facility] for [facility], Vascular, & Lung Health  Referring Provider Ramaswamy       Encounter Date: 12/01/2022  Check In:  Session Check In - 12/01/22 1030       Check-In   Supervising physician immediately available to respond to emergencies [facility] MD immediately available    Physician(s) Bernadene Person, NP    [facility]    Staff Present Samantha Belarus, RD, Dutch Gray, RN, BSN;Randi Reeve BS, ACSM-CEP, Exercise Physiologist;Kaylee Earlene Plater, MS, ACSM-CEP, Exercise Physiologist;Valbona Slabach Katrinka Blazing, RT    Virtual Visit No    Medication changes reported     No    Fall or balance concerns reported    No    Tobacco Cessation No Change    Warm-up and Cool-down Performed as group-led instruction    Resistance Training Performed Yes    VAD Patient? No    PAD/SET Patient? No      Pain Assessment   Currently in Pain? No/denies    Multiple Pain Sites No             Capillary Blood Glucose: No results found for this or any previous visit (from the past 24 hour(s)).    Social History   Tobacco Use  Smoking Status Never  Smokeless Tobacco Former   Types: Chew   Quit date: 05/16/1978  Tobacco Comments   occasional cigar in past; not regular smoker    Goals Met:  Proper associated with RPD/PD & O2 Sat Independence with exercise equipment Exercise tolerated well No report of concerns or symptoms today Strength training completed today  Goals Unmet:  Not Applicable  Comments: Service time is from 1011 to 1145.     Dr. Mechele Collin is Medical Director for Pulmonary Rehab at [facility].

## 2022-12-06 ENCOUNTER — Encounter (HOSPITAL_COMMUNITY)
Admission: RE | Admit: 2022-12-06 | Discharge: 2022-12-06 | Disposition: A | Discharge status: Home / Self Care | Payer: PPO | Source: Ambulatory Visit | Attending: Internal Medicine | Admitting: Internal Medicine

## 2022-12-06 ENCOUNTER — Encounter (HOSPITAL_COMMUNITY): Payer: Self-pay

## 2022-12-06 VITALS — Wt 162.0 lb

## 2022-12-06 DIAGNOSIS — J849 Interstitial pulmonary disease, unspecified: Secondary | ICD-10-CM | POA: Diagnosis not present

## 2022-12-06 NOTE — Progress Notes (Signed)
Daily Session Note  Patient Details  Name: Troy Willis MRN: [id number] Date of Birth: 03/30/1946 Referring Provider:   Doristine Devoid Pulmonary Rehab Walk Test from 11/04/2022 in [facility] for Heart, Vascular, & Lung Health  Referring Provider Ramaswamy       Encounter Date: 12/06/2022  Check In:  Session Check In - 12/06/22 1243       Check-In   Supervising physician immediately available to respond to emergencies [facility] MD immediately available    Physician(s) Bernadene Person, NP    [facility]    Staff Present Essie Hart, RN, Doris Cheadle, MS, ACSM-CEP, Exercise Physiologist;Casey Katrinka Blazing, RT    Virtual Visit No    Medication changes reported     No    Fall or balance concerns reported    No    Tobacco Cessation No Change    Warm-up and Cool-down Performed as group-led instruction    Resistance Training Performed Yes    VAD Patient? No      Pain Assessment   Currently in Pain? No/denies    Multiple Pain Sites No             Capillary Blood Glucose: No results found for this or any previous visit (from the past 24 hour(s)).   Exercise Prescription Changes - 12/06/22 1200       Response to Exercise   Blood Pressure (Admit) 124/60    Blood Pressure (Exercise) 124/60    Blood Pressure (Exit) 96/50    Heart Rate (Admit) 60 bpm    Heart Rate (Exercise) 109 bpm    Heart Rate (Exit) 67 bpm    Oxygen Saturation (Admit) 96 %    Oxygen Saturation (Exercise) 95 %    Oxygen Saturation (Exit) 97 %    Rating of Perceived Exertion (Exercise) 14    Perceived Dyspnea (Exercise) 3    Duration Progress to 30 minutes of  aerobic without signs/symptoms of physical distress    Intensity THRR unchanged      Progression   Progression Continue to progress workloads to maintain intensity without signs/symptoms of physical distress.      Resistance Training   Training Prescription Yes    Weight blue bands    Reps 10-15     Time 10 Minutes      Bike   Watts 68    Minutes 15      Rower   Level 5    Watts 56    Minutes 15             Social History   Tobacco Use  Smoking Status Never  Smokeless Tobacco Former   Types: Chew   Quit date: 05/16/1978  Tobacco Comments   occasional cigar in past; not regular smoker    Goals Met:  Independence with exercise equipment Exercise tolerated well No report of concerns or symptoms today Strength training completed today  Goals Unmet:  Not Applicable  Comments: Service time is from 1026 to 1151    Dr. Mechele Collin is Medical Director for Pulmonary Rehab at [facility].

## 2022-12-07 ENCOUNTER — Telehealth (HOSPITAL_COMMUNITY): Payer: Self-pay

## 2022-12-07 NOTE — Telephone Encounter (Signed)
That is fine 

## 2022-12-07 NOTE — Telephone Encounter (Signed)
Dr. Marchelle Gearing,   Can we increase [name]'s target heart range from 66-131 bpm to 66-150 bpm? Please let me know.

## 2022-12-08 ENCOUNTER — Encounter (HOSPITAL_COMMUNITY)
Admission: RE | Admit: 2022-12-08 | Discharge: 2022-12-08 | Disposition: A | Discharge status: Home / Self Care | Payer: PPO | Source: Ambulatory Visit | Attending: Internal Medicine | Admitting: Internal Medicine

## 2022-12-08 ENCOUNTER — Other Ambulatory Visit: Payer: Self-pay

## 2022-12-08 DIAGNOSIS — J849 Interstitial pulmonary disease, unspecified: Secondary | ICD-10-CM | POA: Diagnosis not present

## 2022-12-08 NOTE — Progress Notes (Signed)
Daily Session Note  Patient Details  Name: Jastin Fore MRN: [id number] Date of Birth: [birth year] Referring Provider:   Doristine Devoid Pulmonary Rehab Walk Test from 11/04/2022 in [facility] for Heart, Vascular, & Lung Health  Referring Provider Ramaswamy       Encounter Date: 12/08/2022  Check In:  Session Check In - 12/08/22 1136       Check-In   Supervising physician immediately available to respond to emergencies [facility] MD immediately available    Physician(s) Eligha Bridegroom, NP    [facility]    Staff Present Raford Pitcher, MS, ACSM-CEP, Exercise Physiologist;Illeana Edick Hermine Messick Belarus, RD, LDN;Jetta Walker BS, ACSM-CEP, Exercise Physiologist    Virtual Visit No    Medication changes reported     No    Fall or balance concerns reported    No    Tobacco Cessation No Change    Warm-up and Cool-down Performed as group-led instruction    Resistance Training Performed Yes    VAD Patient? No    PAD/SET Patient? No      Pain Assessment   Currently in Pain? No/denies    Multiple Pain Sites No             Capillary Blood Glucose: No results found for this or any previous visit (from the past 24 hour(s)).    Social History   Tobacco Use  Smoking Status Never  Smokeless Tobacco Former   Types: Chew   Quit date: 05/16/1978  Tobacco Comments   occasional cigar in past; not regular smoker    Goals Met:  Proper associated with RPD/PD & O2 Sat Independence with exercise equipment Exercise tolerated well No report of concerns or symptoms today Strength training completed today  Goals Unmet:  Not Applicable  Comments: Service time is from 1011 to 1154.    Dr. Mechele Collin is Medical Director for Pulmonary Rehab at [facility].

## 2022-12-12 ENCOUNTER — Inpatient Hospital Stay: Payer: PPO

## 2022-12-12 ENCOUNTER — Other Ambulatory Visit: Payer: Self-pay

## 2022-12-12 ENCOUNTER — Inpatient Hospital Stay: Payer: PPO | Attending: Hematology & Oncology | Admitting: Genetic Counselor

## 2022-12-12 ENCOUNTER — Encounter: Payer: Self-pay | Admitting: Genetic Counselor

## 2022-12-12 DIAGNOSIS — J849 Interstitial pulmonary disease, unspecified: Secondary | ICD-10-CM

## 2022-12-12 DIAGNOSIS — Z836 Family history of other diseases of the respiratory system: Secondary | ICD-10-CM | POA: Insufficient documentation

## 2022-12-12 DIAGNOSIS — Z808 Family history of malignant neoplasm of other organs or systems: Secondary | ICD-10-CM
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
## 2022-12-13 ENCOUNTER — Encounter (HOSPITAL_COMMUNITY): Admission: RE | Admit: 2022-12-13 | Payer: PPO | Source: Ambulatory Visit

## 2022-12-13 DIAGNOSIS — J849 Interstitial pulmonary disease, unspecified: Secondary | ICD-10-CM | POA: Diagnosis not present

## 2022-12-13 NOTE — Progress Notes (Signed)
Daily Session Note  Patient Details  Name: Troy Willis MRN: [id number] Date of Birth: [birth year] Referring Provider:   Doristine Devoid Pulmonary Rehab Walk Test from 11/04/2022 in [facility] for Heart, Vascular, & Lung Health  Referring Provider Ramaswamy       Encounter Date: 12/13/2022  Check In:  Session Check In - 12/13/22 1233       Check-In   Supervising physician immediately available to respond to emergencies [facility] MD immediately available    Physician(s) Robin Searing, NP    Location [facility]    Staff Present Durel Salts, Patriciaann Clan, RN, BSN;Randi Reeve BS, ACSM-CEP, Exercise Physiologist    Virtual Visit No    Medication changes reported     No    Fall or balance concerns reported    No    Tobacco Cessation No Change    Warm-up and Cool-down Performed as group-led instruction    Resistance Training Performed Yes    VAD Patient? No    PAD/SET Patient? No      Pain Assessment   Currently in Pain? No/denies    Multiple Pain Sites No             Capillary Blood Glucose: No results found for this or any previous visit (from the past 24 hour(s)).    Social History   Tobacco Use  Smoking Status Never  Smokeless Tobacco Former   Types: Chew   Quit date: 05/16/1978  Tobacco Comments   occasional cigar in past; not regular smoker    Goals Met:  Independence with exercise equipment Exercise tolerated well No report of concerns or symptoms today Strength training completed today  Goals Unmet:  Not Applicable  Comments: Service time is from 1011 to 1153    Dr. Mechele Collin is Medical Director for Pulmonary Rehab at [facility]

## 2022-12-14 ENCOUNTER — Telehealth: Payer: Self-pay | Admitting: Family Medicine

## 2022-12-14 ENCOUNTER — Other Ambulatory Visit (HOSPITAL_BASED_OUTPATIENT_CLINIC_OR_DEPARTMENT_OTHER): Payer: Self-pay

## 2022-12-14 DIAGNOSIS — Z23 Encounter for immunization: Secondary | ICD-10-CM | POA: Diagnosis not present

## 2022-12-14 DIAGNOSIS — S61012A Laceration without foreign body of left thumb without damage to nail, initial encounter: Secondary | ICD-10-CM

## 2022-12-14 MED ORDER — CEPHALEXIN 500 MG PO CAPS
500.0000 mg | ORAL_CAPSULE | Freq: Three times a day (TID) | ORAL | 0 refills | Status: DC
  Filled 2022-12-14: qty 21, 7d supply, fill #0

## 2022-12-14 NOTE — Telephone Encounter (Signed)
Pt called- he hurt his left thumb and needs to see a hand surgeon for follow-up; caught on electric saw.  Patient reports he was seen at the emergency room out of town, they repaired the wound but stated he should see hand surgery within a couple of days.  He does not have a fracture per his knowledge I will put in an urgent referral request now.  He has an appointment to see my partner Vernona Rieger tomorrow; however, assuming we can get him in with orthopedics I do not feel we will have much to offer.  Will cancel this appointment

## 2022-12-15 ENCOUNTER — Ambulatory Visit: Payer: PPO | Admitting: Family

## 2022-12-15 ENCOUNTER — Encounter (HOSPITAL_COMMUNITY): Admission: RE | Admit: 2022-12-15 | Payer: PPO | Source: Ambulatory Visit

## 2022-12-15 DIAGNOSIS — S61012A Laceration without foreign body of left thumb without damage to nail, initial encounter: Secondary | ICD-10-CM | POA: Diagnosis not present

## 2022-12-19 DIAGNOSIS — G4733 Obstructive sleep apnea (adult) (pediatric): Secondary | ICD-10-CM | POA: Diagnosis not present

## 2022-12-20 ENCOUNTER — Encounter (HOSPITAL_COMMUNITY)
Admission: RE | Admit: 2022-12-20 | Discharge: 2022-12-20 | Disposition: A | Discharge status: Home / Self Care | Payer: PPO | Source: Ambulatory Visit | Attending: Internal Medicine | Admitting: Internal Medicine

## 2022-12-20 VITALS — Wt 161.6 lb

## 2022-12-20 DIAGNOSIS — J849 Interstitial pulmonary disease, unspecified: Secondary | ICD-10-CM | POA: Insufficient documentation

## 2022-12-20 NOTE — Progress Notes (Signed)
Daily Session Note  Patient Details  Name: Troy Willis MRN: [id number] Date of Birth: [birth year] Referring Provider:   Doristine Devoid Pulmonary Rehab Walk Test from 11/04/2022 in [facility] for Heart, Vascular, & Lung Health  Referring Provider Ramaswamy       Encounter Date: 12/20/2022  Check In:  Session Check In - 12/20/22 1158       Check-In   Supervising physician immediately available to respond to emergencies [facility] MD immediately available    Physician(s) Carlyon Shadow, NP    Location [facility]    Staff Present Raford Pitcher, MS, ACSM-CEP, Exercise Physiologist;Randi Dionisio Paschal, ACSM-CEP, Exercise Physiologist;Samantha Belarus, RD, [place] Gray, RN, BSN;Johnny Porter, MS, Exercise Physiologist; Katrinka Blazing, RT    Virtual Visit No    Medication changes reported     No    Fall or balance concerns reported    No    Tobacco Cessation No Change    Warm-up and Cool-down Performed as group-led instruction    Resistance Training Performed Yes    VAD Patient? No    PAD/SET Patient? No      Pain Assessment   Currently in Pain? No/denies    Multiple Pain Sites No             Capillary Blood Glucose: No results found for this or any previous visit (from the past 24 hour(s)).   Exercise Prescription Changes - 12/20/22 1100       Response to Exercise   Blood Pressure (Admit) 122/64    Blood Pressure (Exercise) 136/60    Blood Pressure (Exit) 92/62    Heart Rate (Admit) 58 bpm    Heart Rate (Exercise) 95 bpm    Heart Rate (Exit) 69 bpm    Oxygen Saturation (Admit) 98 %    Oxygen Saturation (Exercise) 94 %    Oxygen Saturation (Exit) 97 %    Rating of Perceived Exertion (Exercise) 13    Perceived Dyspnea (Exercise) 2    Duration Continue with 30 min of aerobic exercise without signs/symptoms of physical distress.    Intensity THRR unchanged      Resistance Training   Training Prescription Yes    Weight blue  bands    Reps 10-15    Time 10 Minutes      Treadmill   MPH 3    Grade 2    Minutes 15    METs 4.12      Bike   Level --   [name] 63    Minutes 15             Social History   Tobacco Use  Smoking Status Never  Smokeless Tobacco Former   Types: Chew   Quit date: 05/16/1978  Tobacco Comments   occasional cigar in past; not regular smoker    Goals Met:  Proper associated with RPD/PD & O2 Sat Independence with exercise equipment Exercise tolerated well No report of concerns or symptoms today Strength training completed today  Goals Unmet:  Not Applicable  Comments: Service time is from 1017 to 1142.    Dr. Mechele Collin is Medical Director for Pulmonary Rehab at [facility].

## 2022-12-21 NOTE — Progress Notes (Signed)
Pulmonary Individual Treatment Plan  Patient Details  Name: Troy Willis MRN: [id number] Date of Birth: April 22, 1946 Referring Provider:   Doristine Devoid Pulmonary Rehab Walk Test from 11/04/2022 in [facility]  Referring Provider Ramaswamy       Initial Encounter Date:  Flowsheet Row Pulmonary Rehab Walk Test from 11/04/2022 in [facility]  Date 11/04/22       Visit Diagnosis: ILD (interstitial lung disease) (HCC)  Patient's Home Medications on Admission:   Current Outpatient Medications:    Bempedoic Acid-Ezetimibe (NEXLIZET) 180-10 MG TABS, Take 1 tablet by mouth daily., Disp: 90 tablet, Rfl: 3   celecoxib (CELEBREX) 50 MG capsule, Take 50 mg by mouth., Disp: , Rfl:    cephALEXin (KEFLEX) 500 MG capsule, Take 1 capsule (500 mg total) by mouth 3 (three) times daily., Disp: 21 capsule, Rfl: 0   Cholecalciferol (VITAMIN D3) 2000 UNITS TABS, Take 2,000 Units by mouth daily. , Disp: , Rfl:    COVID-19 mRNA vaccine 2023-2024 (COMIRNATY) SUSP injection, Inject into the muscle., Disp: 0.3 mL, Rfl: 0   famotidine (PEPCID) 20 MG tablet, Take 20 mg by mouth daily as needed for heartburn or indigestion., Disp: , Rfl:    furosemide (LASIX) 20 MG tablet, Take 1 tablet (20 mg total) by mouth daily., Disp: 90 tablet, Rfl: 3   HYDROCODONE-GUAIFENESIN PO, Take by mouth., Disp: , Rfl:    hydrocortisone 2.5 % ointment, Apply 1 application topically 2 (two) times daily as needed (irritation). , Disp: , Rfl:    influenza vaccine adjuvanted (FLUAD QUADRIVALENT) 0.5 ML injection, Inject into the muscle., Disp: 0.5 mL, Rfl: 0   Melatonin 5 MG CAPS, Take 5 mg by mouth at bedtime., Disp: , Rfl:    nitroGLYCERIN (NITROSTAT) 0.4 MG SL tablet, Place 1 tablet (0.4 mg total) under the tongue every 5 (five) minutes x 3 doses as needed for chest pain., Disp: 25 tablet, Rfl: 3   Respiratory  Therapy Supplies (FLUTTER) DEVI, Use as directed, Disp: 1 each, Rfl: 0   rivaroxaban (XARELTO) 20 MG TABS tablet, Take 1 tablet (20 mg total) by mouth daily with supper., Disp: 90 tablet, Rfl: 1   RSV vaccine recomb adjuvanted (AREXVY) 120 MCG/0.5ML injection, Inject into the muscle., Disp: 0.5 mL, Rfl: 0   triamcinolone cream (KENALOG) 0.1 %, Apply 1 application topically daily as needed (rash). , Disp: , Rfl:  No current facility-administered medications for this encounter.  Facility-Administered Medications Ordered in Other Encounters:    gemcitabine (GEMZAR) chemo syringe for bladder instillation 2,000 mg, 2,000 mg, Bladder Instillation, Once, Bjorn Pippin, MD  Past Medical History: Past Medical History:  Diagnosis Date   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 08/2015 Cath/PCI: LM nl, LAD 21m, LCX small, nl, RCA 80p (4.0x16 Synergy DES),    Cancer (HCC)    skin on forehead   Cataracts, bilateral    surgery   Depression    pt denies    Diverticulosis    Dyspnea    occasional    Family history of pulmonary fibrosis    GERD (gastroesophageal reflux disease)    Hilar adenopathy  a. 08/2015 CT Chest: mild bilat hilar adenopathy and mild soft tissue prominence in inf right hilum, left hilum. Also areas of soft tissue and low-density material filling LLL bronchial airways->? mucous vs mass.   Hx of adenomatous colonic polyps 08/13/2015   Hyperlipidemia    MVP (mitral valve prolapse)    Mild to moderate MR by echo 07/2022   Myocardial infarction [facility])    Nonspecific elevation of levels of transaminase or lactic acid dehydrogenase (LDH)    PMH of ; ? 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 CHA2DS2VASc = 2 -->Xarelto started  08/2015.   Pneumonia, bacterial 05/18/2012   Pulmonary HTN (HCC)    PASP on echo 08/2019>>Likely Group 3 related to bronchiectasis>>repeat echo 09/2020 with PASP   Pulmonary nodules    a. 08/2015 CT Chest: reticulonodular opacities in RUL - largest 5mm - likely 2/2 inflammatory process.   Tricuspid regurgitation     Tobacco Use: Social History   Tobacco Use  Smoking Status Never  Smokeless Tobacco Former   Types: Chew   Quit date: 05/16/1978  Tobacco Comments   occasional cigar in past; not regular smoker    Labs: Review Flowsheet  More data exists      Latest Ref Rng & Units 04/01/2021 07/12/2021 09/10/2021 09/28/2022 11/30/2022  Labs for ITP Cardiac and Pulmonary Rehab  Cholestrol 100 - 199 mg/dL 518  841  660  630  160   LDL (calc) 0 - 99 mg/dL 54  95  64  109  66   HDL-C >39 mg/dL 64  65  63  61  57   Trlycerides 0 - 149 mg/dL 69  55  39  92  54     Details            Capillary Blood Glucose: No results found for: "GLUCAP"   Pulmonary Assessment Scores:  Pulmonary Assessment Scores     Row Name 11/04/22 1159         ADL UCSD   ADL Phase Entry     SOB Score total 26       CAT Score   CAT Score 16       mMRC Score   mMRC Score 2             UCSD: Self-administered rating of dyspnea associated with activities of daily living (ADLs) 6-point scale (0 = "not at all" to 5 = "maximal or unable to do because of breathlessness")  Scoring Scores range from 0 to 120.  Minimally important difference is 5 units  CAT: CAT can identify the health impairment of COPD patients and is better correlated with disease progression.  CAT has a scoring range of zero to 40. The CAT score is classified into four groups of low (less than 10), medium (10 - 20), high (21-30) and very high (31-40) based on the impact level of disease on health status. A CAT score over 10 suggests significant symptoms.  A worsening CAT score could be explained by an exacerbation, poor  medication adherence, poor inhaler technique, or progression of COPD or comorbid conditions.  CAT MCID is 2 points  mMRC: mMRC (Modified Medical Research Council) Dyspnea Scale is used to assess the degree of baseline functional disability in patients of respiratory disease due to dyspnea. No minimal important difference is established. A decrease in score of 1 point or greater is considered a positive change.  Pulmonary Function Assessment:  Pulmonary Function Assessment - 11/04/22 1110       Breath   Bilateral Breath Sounds Rales;Basilar    Shortness of Breath Limiting activity;Yes             Exercise Target Goals: Exercise Program Goal: Individual exercise prescription set using results from initial 6 min walk test and THRR while considering  patient's activity barriers and safety.   Exercise Prescription Goal: Initial exercise prescription builds to 30-45 minutes a day of aerobic activity, 2-3 days per week.  Home exercise guidelines will be given to patient during program as part of exercise prescription that the participant will acknowledge.  Activity Barriers & Risk Stratification:  Activity Barriers & Cardiac Risk Stratification - 11/04/22 1157       Activity Barriers & Cardiac Risk Stratification   Activity Barriers Arthritis;Muscular Weakness;Deconditioning;Shortness of Breath    Cardiac Risk Stratification Low             6 Minute Walk:  6 Minute Walk     Row Name 11/04/22 1159         6 Minute Walk   Distance 1825 feet     Walk Time 6 minutes     # of Rest Breaks 0     MPH 3.46     METS 3.99     RPE 13     Perceived Dyspnea  2     VO2 Peak 13.98     Symptoms No     Resting HR 63 bpm     Resting BP 110/58     Resting Oxygen Saturation  100 %     Exercise Oxygen Saturation  during 6 min walk 91 %     Max Ex. HR 112 bpm     Max Ex. BP 132/64     2 Minute Post BP 114/70       Interval HR   1 Minute HR 68     2 Minute HR 108     3 Minute  HR 104     4 Minute HR 102     5 Minute HR 112     6 Minute HR 104     2 Minute Post HR 62     Interval Heart Rate? Yes       Interval Oxygen   Interval Oxygen? Yes     Baseline Oxygen Saturation % 100 %     1 Minute Oxygen Saturation % 97 %     1 Minute Liters of Oxygen 0 L     2 Minute Oxygen Saturation % 95 %     2 Minute Liters of Oxygen 0 L     3 Minute Oxygen Saturation % 93 %     3 Minute Liters of Oxygen 0 L     4 Minute Oxygen Saturation % 91 %     4 Minute Liters of Oxygen 0 L     5 Minute Oxygen Saturation % 91 %     5 Minute Liters of Oxygen 0 L     6 Minute Oxygen Saturation % 92 %     6 Minute Liters of Oxygen 0 L     2 Minute Post Oxygen Saturation % 98 %     2 Minute Post Liters of Oxygen 0 L              Oxygen Initial Assessment:  Oxygen Initial Assessment - 11/04/22 1109  Home Oxygen   Home Oxygen Device None    Sleep Oxygen Prescription CPAP    Home Exercise Oxygen Prescription None    Home Resting Oxygen Prescription None      Initial 6 min Walk   Oxygen Used None      Program Oxygen Prescription   Program Oxygen Prescription None      Intervention   Short Term Goals To learn and exhibit compliance with exercise, home and travel O2 prescription;To learn and understand importance of maintaining oxygen saturations>88%;To learn and understand importance of monitoring SPO2 with pulse oximeter and demonstrate accurate use of the pulse oximeter.;To learn and demonstrate proper pursed lip breathing techniques or other breathing techniques.     Long  Term Goals Exhibits compliance with exercise, home  and travel O2 prescription;Maintenance of O2 saturations>88%;Verbalizes importance of monitoring SPO2 with pulse oximeter and return demonstration;Exhibits proper breathing techniques, such as pursed lip breathing or other method taught during program session             Oxygen Re-Evaluation:  Oxygen Re-Evaluation     Row Name 11/18/22 0946  12/09/22 1300           Program Oxygen Prescription   Program Oxygen Prescription None None        Home Oxygen   Home Oxygen Device None None      Sleep Oxygen Prescription CPAP CPAP      Home Exercise Oxygen Prescription None None      Home Resting Oxygen Prescription None None        Goals/Expected Outcomes   Short Term Goals To learn and exhibit compliance with exercise, home and travel O2 prescription;To learn and understand importance of maintaining oxygen saturations>88%;To learn and understand importance of monitoring SPO2 with pulse oximeter and demonstrate accurate use of the pulse oximeter.;To learn and demonstrate proper pursed lip breathing techniques or other breathing techniques.  To learn and exhibit compliance with exercise, home and travel O2 prescription;To learn and understand importance of maintaining oxygen saturations>88%;To learn and understand importance of monitoring SPO2 with pulse oximeter and demonstrate accurate use of the pulse oximeter.;To learn and demonstrate proper pursed lip breathing techniques or other breathing techniques.       Long  Term Goals Exhibits compliance with exercise, home  and travel O2 prescription;Maintenance of O2 saturations>88%;Verbalizes importance of monitoring SPO2 with pulse oximeter and return demonstration;Exhibits proper breathing techniques, such as pursed lip breathing or other method taught during program session Exhibits compliance with exercise, home  and travel O2 prescription;Maintenance of O2 saturations>88%;Verbalizes importance of monitoring SPO2 with pulse oximeter and return demonstration;Exhibits proper breathing techniques, such as pursed lip breathing or other method taught during program session      Goals/Expected Outcomes Compliance and understanding of oxygen saturation monitoring and breathing techniques to decrease shortness of breath. Compliance and understanding of oxygen saturation monitoring and breathing  techniques to decrease shortness of breath.               Oxygen Discharge (Final Oxygen Re-Evaluation):  Oxygen Re-Evaluation - 12/09/22 1300       Program Oxygen Prescription   Program Oxygen Prescription None      Home Oxygen   Home Oxygen Device None    Sleep Oxygen Prescription CPAP    Home Exercise Oxygen Prescription None    Home Resting Oxygen Prescription None      Goals/Expected Outcomes   Short Term Goals To learn and exhibit compliance with exercise, home and travel O2  prescription;To learn and understand importance of maintaining oxygen saturations>88%;To learn and understand importance of monitoring SPO2 with pulse oximeter and demonstrate accurate use of the pulse oximeter.;To learn and demonstrate proper pursed lip breathing techniques or other breathing techniques.     Long  Term Goals Exhibits compliance with exercise, home  and travel O2 prescription;Maintenance of O2 saturations>88%;Verbalizes importance of monitoring SPO2 with pulse oximeter and return demonstration;Exhibits proper breathing techniques, such as pursed lip breathing or other method taught during program session    Goals/Expected Outcomes Compliance and understanding of oxygen saturation monitoring and breathing techniques to decrease shortness of breath.             Initial Exercise Prescription:  Initial Exercise Prescription - 11/04/22 1200       Date of Initial Exercise RX and Referring Provider   Date 11/04/22    Referring Provider Ramaswamy    Expected Discharge Date 02/02/23      Elliptical   Level 1    Speed 60    Minutes 15    METs 2      Rower   Level 1    Watts 15    Minutes 15      Prescription Details   Frequency (times per week) 2    Duration Progress to 30 minutes of continuous aerobic without signs/symptoms of physical distress      Intensity   THRR 40-80% of Max Heartrate 57-114    Ratings of Perceived Exertion 11-13    Perceived Dyspnea 0-4       Progression   Progression Continue to progress workloads to maintain intensity without signs/symptoms of physical distress.      Resistance Training   Training Prescription Yes    Weight --   red bands   Reps 10-15             Perform Capillary Blood Glucose checks as needed.  Exercise Prescription Changes:   Exercise Prescription Changes     Row Name 11/22/22 1200 12/06/22 1200 12/20/22 1100         Response to Exercise   Blood Pressure (Admit) 112/60 124/60 122/64     Blood Pressure (Exercise) 128/58 124/60 136/60     Blood Pressure (Exit) 100/56 96/50 92/62      Heart Rate (Admit) 61 bpm 60 bpm 58 bpm     Heart Rate (Exercise) 117 bpm 109 bpm 95 bpm     Heart Rate (Exit) 70 bpm 67 bpm 69 bpm     Oxygen Saturation (Admit) 99 % 96 % 98 %     Oxygen Saturation (Exercise) 92 % 95 % 94 %     Oxygen Saturation (Exit) 96 % 97 % 97 %     Rating of Perceived Exertion (Exercise) 15 14 13      Perceived Dyspnea (Exercise) 2 3 2      Duration Progress to 30 minutes of  aerobic without signs/symptoms of physical distress Progress to 30 minutes of  aerobic without signs/symptoms of physical distress Continue with 30 min of aerobic exercise without signs/symptoms of physical distress.     Intensity THRR unchanged THRR unchanged THRR unchanged       Progression   Progression Continue to progress workloads to maintain intensity without signs/symptoms of physical distress. Continue to progress workloads to maintain intensity without signs/symptoms of physical distress. --       Resistance Training   Training Prescription Yes Yes Yes     Weight blue bands blue bands blue bands  Reps 10-15 10-15 10-15     Time 10 Minutes 10 Minutes 10 Minutes       Treadmill   MPH -- -- 3     Grade -- -- 2     Minutes -- -- 15     METs -- -- 4.12       Bike   Level -- -- --  [name] 50 68 63     Minutes 15 15 15        Rower   Level 4 5 --     Watts 55 56 --     Minutes 15 15 --               Exercise Comments:   Exercise Comments     Row Name 11/10/22 1135           Exercise Comments Pt completed his first day of group exercise. He exercised on the upright elliptical speed 4, incline 2, METs 4.8, for 15 min. He then exercised on the rower at level 2 for 15 min, avg watts 35. Pt tolerated well although more tired on the rower. He performed warm up and cool down without limitation, including squats. He is motivated. Discussed METs with good understanding.                Exercise Goals and Review:   Exercise Goals     Row Name 11/04/22 1158 11/18/22 0943 12/09/22 1254         Exercise Goals   Increase Physical Activity Yes Yes Yes     Intervention Provide advice, education, support and counseling about physical activity/exercise needs.;Develop an individualized exercise prescription for aerobic and resistive training based on initial evaluation findings, risk stratification, comorbidities and participant's personal goals. Provide advice, education, support and counseling about physical activity/exercise needs.;Develop an individualized exercise prescription for aerobic and resistive training based on initial evaluation findings, risk stratification, comorbidities and participant's personal goals. Provide advice, education, support and counseling about physical activity/exercise needs.;Develop an individualized exercise prescription for aerobic and resistive training based on initial evaluation findings, risk stratification, comorbidities and participant's personal goals.     Expected Outcomes Short Term: Attend rehab on a regular basis to increase amount of physical activity.;Long Term: Exercising regularly at least 3-5 days a week.;Long Term: Add in home exercise to make exercise part of routine and to increase amount of physical activity. Short Term: Attend rehab on a regular basis to increase amount of physical activity.;Long Term: Exercising regularly at  least 3-5 days a week.;Long Term: Add in home exercise to make exercise part of routine and to increase amount of physical activity. Short Term: Attend rehab on a regular basis to increase amount of physical activity.;Long Term: Exercising regularly at least 3-5 days a week.;Long Term: Add in home exercise to make exercise part of routine and to increase amount of physical activity.     Increase Strength and Stamina Yes Yes Yes     Intervention Provide advice, education, support and counseling about physical activity/exercise needs.;Develop an individualized exercise prescription for aerobic and resistive training based on initial evaluation findings, risk stratification, comorbidities and participant's personal goals. Provide advice, education, support and counseling about physical activity/exercise needs.;Develop an individualized exercise prescription for aerobic and resistive training based on initial evaluation findings, risk stratification, comorbidities and participant's personal goals. Provide advice, education, support and counseling about physical activity/exercise needs.;Develop an individualized exercise prescription for aerobic and resistive training based on initial evaluation  findings, risk stratification, comorbidities and participant's personal goals.     Expected Outcomes Short Term: Increase workloads from initial exercise prescription for resistance, speed, and METs.;Short Term: Perform resistance training exercises routinely during rehab and add in resistance training at home;Long Term: Improve cardiorespiratory fitness, muscular endurance and strength as measured by increased METs and functional capacity ( ) Short Term: Increase workloads from initial exercise prescription for resistance, speed, and METs.;Short Term: Perform resistance training exercises routinely during rehab and add in resistance training at home;Long Term: Improve cardiorespiratory fitness, muscular endurance and  strength as measured by increased METs and functional capacity ( ) Short Term: Increase workloads from initial exercise prescription for resistance, speed, and METs.;Short Term: Perform resistance training exercises routinely during rehab and add in resistance training at home;Long Term: Improve cardiorespiratory fitness, muscular endurance and strength as measured by increased METs and functional capacity ( )     Able to understand and use rate of perceived exertion (RPE) scale Yes Yes Yes     Intervention Provide education and explanation on how to use RPE scale Provide education and explanation on how to use RPE scale Provide education and explanation on how to use RPE scale     Expected Outcomes Short Term: Able to use RPE daily in rehab to express subjective intensity level;Long Term:  Able to use RPE to guide intensity level when exercising independently Short Term: Able to use RPE daily in rehab to express subjective intensity level;Long Term:  Able to use RPE to guide intensity level when exercising independently Short Term: Able to use RPE daily in rehab to express subjective intensity level;Long Term:  Able to use RPE to guide intensity level when exercising independently     Able to understand and use Dyspnea scale Yes Yes Yes     Intervention Provide education and explanation on how to use Dyspnea scale Provide education and explanation on how to use Dyspnea scale Provide education and explanation on how to use Dyspnea scale     Expected Outcomes Short Term: Able to use Dyspnea scale daily in rehab to express subjective sense of shortness of breath during exertion;Long Term: Able to use Dyspnea scale to guide intensity level when exercising independently Short Term: Able to use Dyspnea scale daily in rehab to express subjective sense of shortness of breath during exertion;Long Term: Able to use Dyspnea scale to guide intensity level when exercising independently Short Term: Able to use  Dyspnea scale daily in rehab to express subjective sense of shortness of breath during exertion;Long Term: Able to use Dyspnea scale to guide intensity level when exercising independently     Knowledge and understanding of Target Heart Rate Range (THRR) Yes Yes Yes     Intervention Provide education and explanation of THRR including how the numbers were predicted and where they are located for reference Provide education and explanation of THRR including how the numbers were predicted and where they are located for reference Provide education and explanation of THRR including how the numbers were predicted and where they are located for reference     Expected Outcomes Short Term: Able to state/look up THRR;Long Term: Able to use THRR to govern intensity when exercising independently;Short Term: Able to use daily as guideline for intensity in rehab Short Term: Able to state/look up THRR;Long Term: Able to use THRR to govern intensity when exercising independently;Short Term: Able to use daily as guideline for intensity in rehab Short Term: Able to state/look up THRR;Long Term: Able to use THRR to  govern intensity when exercising independently;Short Term: Able to use daily as guideline for intensity in rehab     Understanding of Exercise Prescription Yes Yes Yes     Intervention Provide education, explanation, and written materials on patient's individual exercise prescription Provide education, explanation, and written materials on patient's individual exercise prescription Provide education, explanation, and written materials on patient's individual exercise prescription     Expected Outcomes Short Term: Able to explain program exercise prescription;Long Term: Able to explain home exercise prescription to exercise independently Short Term: Able to explain program exercise prescription;Long Term: Able to explain home exercise prescription to exercise independently Short Term: Able to explain program exercise  prescription;Long Term: Able to explain home exercise prescription to exercise independently              Exercise Goals Re-Evaluation :  Exercise Goals Re-Evaluation     Row Name 11/18/22 0943 12/09/22 1255           Exercise Goal Re-Evaluation   Exercise Goals Review Increase Physical Activity;Able to understand and use Dyspnea scale;Understanding of Exercise Prescription;Increase Strength and Stamina;Knowledge and understanding of Target Heart Rate Range (THRR);Able to understand and use rate of perceived exertion (RPE) scale Increase Physical Activity;Able to understand and use Dyspnea scale;Understanding of Exercise Prescription;Increase Strength and Stamina;Knowledge and understanding of Target Heart Rate Range (THRR);Able to understand and use rate of perceived exertion (RPE) scale      Comments Troy Willis has completed 2 exercise sessions. He exercises for 15 min on the upright elliptical and rower. Troy Willis averages 4.8 METs at 2 speed and 4 incline on the upright elliptical and 50 watts at level 4 on the rower. He performs the warmup and cooldown standing without limitations. It is too soon to note any discernable progressions. Will continue to monitor and progress as able. Troy Willis has completed 8 exercise sessions. He exercises for 15 min on the treadmill and airdyne. Troy Willis averages 4.77 METs at 3.2 mph and 3% incline and 65 watts on the airdyne. He performs the warmup and cooldown standing without limitations. He has progressed from the upright elliptical to the treadmill and rower to airdyne. Both exercise modes are more comfortable for Troy Willis and more difficult. He enjoys these exercise modes more and tolerates both well. He recently started both exercise modes as it is too soon to note any progressions for the treadmil and airdyne. Troy Willis seems very motivated to exercise and improve his functional capacity. Will continue to monitor and progress as able.      Expected Outcomes Through exercise at rehab and  home, the patient will decrease shortness of breath with daily activities and feel confident in carrying out an exercise regimen at home. Through exercise at rehab and home, the patient will decrease shortness of breath with daily activities and feel confident in carrying out an exercise regimen at home.               Discharge Exercise Prescription (Final Exercise Prescription Changes):  Exercise Prescription Changes - 12/20/22 1100       Response to Exercise   Blood Pressure (Admit) 122/64    Blood Pressure (Exercise) 136/60    Blood Pressure (Exit) 92/62    Heart Rate (Admit) 58 bpm    Heart Rate (Exercise) 95 bpm    Heart Rate (Exit) 69 bpm    Oxygen Saturation (Admit) 98 %    Oxygen Saturation (Exercise) 94 %    Oxygen Saturation (Exit) 97 %    Rating of  Perceived Exertion (Exercise) 13    Perceived Dyspnea (Exercise) 2    Duration Continue with 30 min of aerobic exercise without signs/symptoms of physical distress.    Intensity THRR unchanged      Resistance Training   Training Prescription Yes    Weight blue bands    Reps 10-15    Time 10 Minutes      Treadmill   MPH 3    Grade 2    Minutes 15    METs 4.12      Bike   Level --   [name] 63    Minutes 15             Nutrition:  Target Goals: Understanding of nutrition guidelines, daily intake of sodium 1500mg , cholesterol 200mg , calories 30% from fat and 7% or less from saturated fats, daily to have 5 or more servings of fruits and vegetables.  Biometrics:  Pre Biometrics - 11/04/22 1216       Pre Biometrics   Grip Strength 40 kg              Nutrition Therapy Plan and Nutrition Goals:  Nutrition Therapy & Goals - 12/08/22 1132       Nutrition Therapy   Diet Heart Healthy Diet      Personal Nutrition Goals   Nutrition Goal Patient to limit sodium 2300mg  per day   goal in progress.   Personal Goal #2 Patient to maintain weight or identify strategies for weight gain as needed  of 0.5-2.0# while enrolled in pulmonary rehab.   goal in progress.   Comments Goals in progress. Troy Willis lives at [facility] assisted living with his wife. He is provided lunch and dinner daily and does eat breakfast in his room daily. He is up 4.8# (~1#/week) since starting with our program. He recently stopped salting his food approximately two weeks ago and has decreased high salt snacks (saltines, chips, etc). Troy Willis will continue to benefit from participation in pulmonary rehab for nutrition, exercise, and lifestyle changes.      Intervention Plan   Intervention Prescribe, educate and counsel regarding individualized specific dietary modifications aiming towards targeted core components such as weight, hypertension, lipid management, diabetes, heart failure and other comorbidities.;Nutrition handout(s) given to patient.    Expected Outcomes Short Term Goal: Understand basic principles of dietary content, such as calories, fat, sodium, cholesterol and nutrients.;Long Term Goal: Adherence to prescribed nutrition plan.             Nutrition Assessments:  Nutrition Assessments - 11/10/22 1624       Rate Your Plate Scores   Pre Score 51            MEDIFICTS Score Key: ?70 Need to make dietary changes  40-70 Heart Healthy Diet ? 40 Therapeutic Level Cholesterol Diet  Flowsheet Row PULMONARY REHAB OTHER RESPIRATORY from 11/10/2022 in [facility] for Heart, Vascular, & Lung Health  Picture Your Plate Total Score on Admission 51      Picture Your Plate Scores: <16 Unhealthy dietary pattern with much room for improvement. 41-50 Dietary pattern unlikely to meet recommendations for good health and room for improvement. 51-60 More healthful dietary pattern, with some room for improvement.  >60 Healthy dietary pattern, although there may be some specific behaviors that could be improved.    Nutrition Goals Re-Evaluation:  Nutrition Goals Re-Evaluation     Row Name  11/10/22 1105 12/08/22 1132  Goals   Current Weight 157 lb 3 oz (71.3 kg) 162 lb 4.1 oz (73.6 kg)      Comment LDL 101, ferritin WNL lipids WNL      Expected Outcome Troy Willis lives at [place] assisted living with his wife. He is provided lunch and dinner daily and does eat breakfast in his room daily. He reports stable appetite and weight at this time; he is not interested in weight gain at this time. He recently stopped salting his food approximately two weeks ago and has decreased high salt snacks (saltines, chips, etc). Troy Willis will continue to benefit from participation in pulmonary rehab for nutrition, exercise, and lifestyle changes. Goals in progress. Troy Willis lives at [place] assisted living with his wife. He is provided lunch and dinner daily and does eat breakfast in his room daily. He is up 4.8# (~1#/week) since starting with our program. He recently stopped salting his food approximately two weeks ago and has decreased high salt snacks (saltines, chips, etc). Troy Willis will continue to benefit from participation in pulmonary rehab for nutrition, exercise, and lifestyle changes.               Nutrition Goals Discharge (Final Nutrition Goals Re-Evaluation):  Nutrition Goals Re-Evaluation - 12/08/22 1132       Goals   Current Weight 162 lb 4.1 oz (73.6 kg)    Comment lipids WNL    Expected Outcome Goals in progress. Troy Willis lives at [place] assisted living with his wife. He is provided lunch and dinner daily and does eat breakfast in his room daily. He is up 4.8# (~1#/week) since starting with our program. He recently stopped salting his food approximately two weeks ago and has decreased high salt snacks (saltines, chips, etc). Troy Willis will continue to benefit from participation in pulmonary rehab for nutrition, exercise, and lifestyle changes.             Psychosocial: Target Goals: Acknowledge presence or absence of significant depression and/or stress, maximize coping skills,  provide positive support system. Participant is able to verbalize types and ability to use techniques and skills needed for reducing stress and depression.  Initial Review & Psychosocial Screening:  Initial Psych Review & Screening - 11/04/22 1102       Initial Review   Current issues with Current Stress Concerns    Source of Stress Concerns Family    Comments Troy Willis's stress comes from being his wifes caretaker during her cancer diagnosis. He also sold his home of 59yrs to move into an assisted living facility. He is managing his stress well.      Family Dynamics   Good Support System? Yes      Barriers   Psychosocial barriers to participate in program The patient should benefit from training in stress management and relaxation.      Screening Interventions   Interventions Encouraged to exercise    Expected Outcomes Long Term Goal: Stressors or current issues are controlled or eliminated.             Quality of Life Scores:  Scores of 19 and below usually indicate a poorer quality of life in these areas.  A difference of  2-3 points is a clinically meaningful difference.  A difference of 2-3 points in the total score of the Quality of Life Index has been associated with significant improvement in overall quality of life, self-image, physical symptoms, and general health in studies assessing change in quality of life.  PHQ-9: Review Flowsheet  More data exists  11/04/2022 04/19/2022 09/20/2021 04/05/2021 12/11/2020  Depression screen PHQ 2/9  Decreased Interest 0 0 0 0 1  Down, Depressed, Hopeless 0 0 0 0 0  PHQ - 2 Score 0 0 0 0 1  Altered sleeping 0 0 - - -  Tired, decreased energy 1 0 - - -  Change in appetite 0 0 - - -  Feeling bad or failure about yourself  0 0 - - -  Trouble concentrating 0 0 - - -  Moving slowly or fidgety/restless 0 0 - - -  Suicidal thoughts 0 0 - - -  PHQ-9 Score 1 0 - - -  Difficult doing work/chores Somewhat difficult Not difficult at all - - -     Details           Interpretation of Total Score  Total Score Depression Severity:  1-4 = Minimal depression, 5-9 = Mild depression, 10-14 = Moderate depression, 15-19 = Moderately severe depression, 20-27 = Severe depression   Psychosocial Evaluation and Intervention:  Psychosocial Evaluation - 11/04/22 1106       Psychosocial Evaluation & Interventions   Interventions Encouraged to exercise with the program and follow exercise prescription    Comments Troy Willis is currently taking care of his wife who has hodgkins lymphoma. He manages stress well.    Expected Outcomes For Troy Willis to participate in PR free of any psychosocial barriers or concerns.    Continue Psychosocial Services  No Follow up required             Psychosocial Re-Evaluation:  Psychosocial Re-Evaluation     Row Name 11/21/22 0840 12/14/22 1503           Psychosocial Re-Evaluation   Current issues with Current Stress Concerns Current Stress Concerns      Comments Troy Willis has recently started the program. He is still dealing with the stress of his wifes illness. Staff will educate Troy Willis on health ways to deal with his stress. Troy Willis denies any new psychosocial barriers or concerns at this time. He is still dealing with his wifes illness, but states she is doing better.      Expected Outcomes For Troy Willis to participate in PR free of any psychosocial barriers or concerns. For Troy Willis to participate in PR free of any psychosocial barriers or concerns.      Interventions Encouraged to attend Pulmonary Rehabilitation for the exercise Encouraged to attend Pulmonary Rehabilitation for the exercise      Continue Psychosocial Services  Follow up required by staff Follow up required by staff               Psychosocial Discharge (Final Psychosocial Re-Evaluation):  Psychosocial Re-Evaluation - 12/14/22 1503       Psychosocial Re-Evaluation   Current issues with Current Stress Concerns    Comments Troy Willis denies any new psychosocial  barriers or concerns at this time. He is still dealing with his wifes illness, but states she is doing better.    Expected Outcomes For Troy Willis to participate in PR free of any psychosocial barriers or concerns.    Interventions Encouraged to attend Pulmonary Rehabilitation for the exercise    Continue Psychosocial Services  Follow up required by staff             Education: Education Goals: Education classes will be provided on a weekly basis, covering required topics. Participant will state understanding/return demonstration of topics presented.  Learning Barriers/Preferences:  Learning Barriers/Preferences - 11/04/22 1107  Learning Barriers/Preferences   Learning Barriers Sight   has hard time remembering things   Learning Preferences Group Instruction;Skilled Demonstration             Education Topics: Introduction to Pulmonary Rehab Group instruction provided by PowerPoint, verbal discussion, and written material to support subject matter. Instructor reviews what Pulmonary Rehab is, the purpose of the program, and how patients are referred.     Know Your Numbers Group instruction that is supported by a PowerPoint presentation. Instructor discusses importance of knowing and understanding resting, exercise, and post-exercise oxygen saturation, heart rate, and blood pressure. Oxygen saturation, heart rate, blood pressure, rating of perceived exertion, and dyspnea are reviewed along with a normal range for these values.  Flowsheet Row PULMONARY REHAB OTHER RESPIRATORY from 11/24/2022 in [facility] for Heart, Vascular, & Lung Health  Date 11/24/22  Educator EP  Instruction Review Code 1- Verbalizes Understanding       Exercise for the Pulmonary Patient Group instruction that is supported by a PowerPoint presentation. Instructor discusses benefits of exercise, core components of exercise, frequency, duration, and intensity of an exercise routine,  importance of utilizing pulse oximetry during exercise, safety while exercising, and options of places to exercise outside of rehab.  Flowsheet Row PULMONARY REHAB OTHER RESPIRATORY from 11/10/2022 in [facility] for Heart, Vascular, & Lung Health  Date 11/10/22  Educator EP  Instruction Review Code 1- Verbalizes Understanding          MET Level  Group instruction provided by PowerPoint, verbal discussion, and written material to support subject matter. Instructor reviews what METs are and how to increase METs.    Pulmonary Medications Verbally interactive group education provided by instructor with focus on inhaled medications and proper administration.   Anatomy and Physiology of the Respiratory System Group instruction provided by PowerPoint, verbal discussion, and written material to support subject matter. Instructor reviews respiratory cycle and anatomical components of the respiratory system and their functions. Instructor also reviews differences in obstructive and restrictive respiratory diseases with examples of each.    Oxygen Safety Group instruction provided by PowerPoint, verbal discussion, and written material to support subject matter. There is an overview of "What is Oxygen" and "Why do we need it".  Instructor also reviews how to create a safe environment for oxygen use, the importance of using oxygen as prescribed, and the risks of noncompliance. There is a brief discussion on traveling with oxygen and resources the patient may utilize. Flowsheet Row PULMONARY REHAB OTHER RESPIRATORY from 12/01/2022 in [facility] for Heart, Vascular, & Lung Health  Date 12/01/22  Educator RN  Instruction Review Code 1- Verbalizes Understanding       Oxygen Use Group instruction provided by PowerPoint, verbal discussion, and written material to discuss how supplemental oxygen is prescribed and different types of oxygen supply systems.  Resources for more information are provided.  Flowsheet Row PULMONARY REHAB OTHER RESPIRATORY from 12/08/2022 in [facility] for Heart, Vascular, & Lung Health  Date 12/08/22  Educator RT  Instruction Review Code 1- Verbalizes Understanding       Breathing Techniques Group instruction that is supported by demonstration and informational handouts. Instructor discusses the benefits of pursed lip and diaphragmatic breathing and detailed demonstration on how to perform both.     Risk Factor Reduction Group instruction that is supported by a PowerPoint presentation. Instructor discusses the definition of a risk factor, different risk factors for  pulmonary disease, and how the heart and lungs work together.   MD Day A group question and answer session with a medical doctor that allows participants to ask questions that relate to their pulmonary disease state.   Nutrition for the Pulmonary Patient Group instruction provided by PowerPoint slides, verbal discussion, and written materials to support subject matter. The instructor gives an explanation and review of healthy diet recommendations, which includes a discussion on weight management, recommendations for fruit and vegetable consumption, as well as protein, fluid, caffeine, fiber, sodium, sugar, and alcohol. Tips for eating when patients are short of breath are discussed.    Other Education Group or individual verbal, written, or video instructions that support the educational goals of the pulmonary rehab program.    Knowledge Questionnaire Score:   Core Components/Risk Factors/Patient Goals at Admission:  Personal Goals and Risk Factors at Admission - 11/04/22 1108       Core Components/Risk Factors/Patient Goals on Admission    Weight Management Weight Maintenance    Improve shortness of breath with ADL's Yes    Intervention Provide education, individualized exercise plan and daily activity instruction to  help decrease symptoms of SOB with activities of daily living.    Expected Outcomes Long Term: Be able to perform more ADLs without symptoms or delay the onset of symptoms;Short Term: Improve cardiorespiratory fitness to achieve a reduction of symptoms when performing ADLs             Core Components/Risk Factors/Patient Goals Review:   Goals and Risk Factor Review     Row Name 11/21/22 0846 12/14/22 1507           Core Components/Risk Factors/Patient Goals Review   Personal Goals Review Improve shortness of breath with ADL's Weight Management/Obesity;Improve shortness of breath with ADL's      Review Troy Willis has recently started the program. He is able to demonstrate purse lip breathing when he gets short of breath. He is still working on the goal to improve his shortness of breath. Troy Willis is enjoying the program so far. We will continue to monitor his progress. Weight management goal progressing. Troy Willis is working with dietician to find ways to maintain weight. . Goal progressing on improving his shortness of breath with ADLs.      Expected Outcomes See admission goals See admission goals               Core Components/Risk Factors/Patient Goals at Discharge (Final Review):   Goals and Risk Factor Review - 12/14/22 1507       Core Components/Risk Factors/Patient Goals Review   Personal Goals Review Weight Management/Obesity;Improve shortness of breath with ADL's    Review Weight management goal progressing. Troy Willis is working with dietician to find ways to maintain weight. . Goal progressing on improving his shortness of breath with ADLs.    Expected Outcomes See admission goals             ITP Comments:Pt is making expected progress toward Pulmonary Rehab goals after completing 10 sessions. Recommend continued exercise, life style modification, education, and utilization of breathing techniques to increase stamina and strength, while also decreasing shortness of breath with  exertion.  Dr. Mechele Collin is Medical Director for Pulmonary Rehab at [facility]     Comments: Dr. Mechele Collin is Medical Director for Pulmonary Rehab at [facility].

## 2022-12-22 ENCOUNTER — Encounter (HOSPITAL_COMMUNITY)
Admission: RE | Admit: 2022-12-22 | Discharge: 2022-12-22 | Disposition: A | Discharge status: Home / Self Care | Payer: PPO | Source: Ambulatory Visit | Attending: Internal Medicine | Admitting: Internal Medicine

## 2022-12-22 DIAGNOSIS — J849 Interstitial pulmonary disease, unspecified: Secondary | ICD-10-CM | POA: Diagnosis not present

## 2022-12-22 DIAGNOSIS — S61012A Laceration without foreign body of left thumb without damage to nail, initial encounter: Secondary | ICD-10-CM | POA: Diagnosis not present

## 2022-12-22 NOTE — Progress Notes (Signed)
Daily Session Note  Patient Details  Name: Troy Willis MRN: [id number] Date of Birth: 1945/12/28 Referring Provider:   Doristine Devoid Pulmonary Rehab Walk Test from 11/04/2022 in [facility] for Heart, Vascular, & Lung Health  Referring Provider Ramaswamy       Encounter Date: 12/22/2022  Check In:  Session Check In - 12/22/22 1044       Check-In   Supervising physician immediately available to respond to emergencies [facility] MD immediately available    Physician(s) Joni Reining, NP    Location [facility]    Staff Present Raford Pitcher, MS, ACSM-CEP, Exercise Physiologist;Randi Dionisio Paschal, ACSM-CEP, Exercise Physiologist;Mary Gerre Scull, RN, BSN;Casey Glenetta Borg, MS, Exercise Physiologist    Virtual Visit No    Medication changes reported     No    Fall or balance concerns reported    No    Tobacco Cessation No Change    Warm-up and Cool-down Performed as group-led instruction    Resistance Training Performed Yes    VAD Patient? No    PAD/SET Patient? No      Pain Assessment   Currently in Pain? No/denies    Multiple Pain Sites No             Capillary Blood Glucose: No results found for this or any previous visit (from the past 24 hour(s)).    Social History   Tobacco Use  Smoking Status Never  Smokeless Tobacco Former   Types: Chew   Quit date: 05/16/1978  Tobacco Comments   occasional cigar in past; not regular smoker    Goals Met:  Proper associated with RPD/PD & O2 Sat Exercise tolerated well No report of concerns or symptoms today Strength training completed today  Goals Unmet:  Not Applicable  Comments: Service time is from 1010 to 1143.    Dr. Mechele Collin is Medical Director for Pulmonary Rehab at [facility].

## 2022-12-27 ENCOUNTER — Encounter (HOSPITAL_COMMUNITY)
Admission: RE | Admit: 2022-12-27 | Discharge: 2022-12-27 | Disposition: A | Discharge status: Home / Self Care | Payer: PPO | Source: Ambulatory Visit | Attending: Internal Medicine | Admitting: Internal Medicine

## 2022-12-27 DIAGNOSIS — J849 Interstitial pulmonary disease, unspecified: Secondary | ICD-10-CM

## 2022-12-27 NOTE — Progress Notes (Signed)
Daily Session Note  Patient Details  Name: Troy Willis MRN: [id number] Date of Birth: Jun 12, 1945 Referring Provider:   Doristine Devoid Pulmonary Rehab Walk Test from 11/04/2022 in [facility] for Heart, Vascular, & Lung Health  Referring Provider Ramaswamy       Encounter Date: 12/27/2022  Check In:  Session Check In - 12/27/22 1104       Check-In   Supervising physician immediately available to respond to emergencies [facility] MD immediately available    Physician(s) Jari Favre, PA    Location [facility]    Staff Present Raford Pitcher, MS, ACSM-CEP, Exercise Physiologist; Dionisio Paschal, ACSM-CEP, Exercise Physiologist;Mary Gerre Scull, RN, Fuller Plan, RT    Virtual Visit No    Medication changes reported     No    Fall or balance concerns reported    No    Tobacco Cessation No Change    Warm-up and Cool-down Performed as group-led instruction    Resistance Training Performed Yes    VAD Patient? No    PAD/SET Patient? No      Pain Assessment   Currently in Pain? No/denies    Multiple Pain Sites No             Capillary Blood Glucose: No results found for this or any previous visit (from the past 24 hour(s)).    Social History   Tobacco Use  Smoking Status Never  Smokeless Tobacco Former   Types: Chew   Quit date: 05/16/1978  Tobacco Comments   occasional cigar in past; not regular smoker    Goals Met:  Independence with exercise equipment Exercise tolerated well No report of concerns or symptoms today Strength training completed today  Goals Unmet:  Not Applicable  Comments: Service time is from 1014 to 1138.    Dr. Mechele Collin is Medical Director for Pulmonary Rehab at [facility].

## 2022-12-29 ENCOUNTER — Encounter (HOSPITAL_COMMUNITY)
Admission: RE | Admit: 2022-12-29 | Discharge: 2022-12-29 | Disposition: A | Discharge status: Home / Self Care | Payer: PPO | Source: Ambulatory Visit | Attending: Internal Medicine | Admitting: Internal Medicine

## 2022-12-29 DIAGNOSIS — J849 Interstitial pulmonary disease, unspecified: Secondary | ICD-10-CM | POA: Diagnosis not present

## 2022-12-29 DIAGNOSIS — S61012A Laceration without foreign body of left thumb without damage to nail, initial encounter: Secondary | ICD-10-CM | POA: Diagnosis not present

## 2022-12-29 NOTE — Progress Notes (Signed)
Daily Session Note  Patient Details  Name: Troy Willis MRN: [id number] Date of Birth: July 14, 1945 Referring Provider:   Doristine Devoid Pulmonary Rehab Walk Test from 11/04/2022 in [facility] for Heart, Vascular, & Lung Health  Referring Provider Ramaswamy       Encounter Date: 12/29/2022  Check In:  Session Check In - 12/29/22 1206       Check-In   Supervising physician immediately available to respond to emergencies [facility] MD immediately available    Physician(s) Edd Fabian, NP    Location [facility]    Staff Present Raford Pitcher, MS, ACSM-CEP, Exercise Physiologist;Randi Dionisio Paschal, ACSM-CEP, Exercise Physiologist;Mary Gerre Scull, RN, Fuller Plan, RT    Virtual Visit No    Medication changes reported     No    Fall or balance concerns reported    No    Tobacco Cessation No Change    Warm-up and Cool-down Performed as group-led instruction    Resistance Training Performed Yes    VAD Patient? No    PAD/SET Patient? No      Pain Assessment   Currently in Pain? No/denies    Multiple Pain Sites No             Capillary Blood Glucose: No results found for this or any previous visit (from the past 24 hour(s)).    Social History   Tobacco Use  Smoking Status Never  Smokeless Tobacco Former   Types: Chew   Quit date: 05/16/1978  Tobacco Comments   occasional cigar in past; not regular smoker    Goals Met:  Proper associated with RPD/PD & O2 Sat Independence with exercise equipment Exercise tolerated well No report of concerns or symptoms today Strength training completed today  Goals Unmet:  Not Applicable  Comments: Service time is from 1012 to 1155.    Dr. Mechele Collin is Medical Director for Pulmonary Rehab at [facility].

## 2023-01-03 ENCOUNTER — Encounter (HOSPITAL_COMMUNITY)
Admission: RE | Admit: 2023-01-03 | Discharge: 2023-01-03 | Disposition: A | Discharge status: Home / Self Care | Payer: PPO | Source: Ambulatory Visit | Attending: Internal Medicine | Admitting: Internal Medicine

## 2023-01-03 ENCOUNTER — Encounter (HOSPITAL_COMMUNITY): Payer: Self-pay

## 2023-01-03 VITALS — Wt 166.7 lb

## 2023-01-03 DIAGNOSIS — J849 Interstitial pulmonary disease, unspecified: Secondary | ICD-10-CM | POA: Diagnosis not present

## 2023-01-03 NOTE — Progress Notes (Signed)
Daily Session Note  Patient Details  Name: Troy Willis MRN: [id number] Date of Birth: [birth year] Referring Provider:   Doristine Devoid Pulmonary Rehab Walk Test from 11/04/2022 in [facility] for Heart, Vascular, & Lung Health  Referring Provider Ramaswamy       Encounter Date: 01/03/2023  Check In:  Session Check In - 01/03/23 1110       Check-In   Supervising physician immediately available to respond to emergencies [facility] MD immediately available    Physician(s) Edd Fabian, NP    Location [facility]    Staff Present Raford Pitcher, MS, ACSM-CEP, Exercise Physiologist;Girl Schissler Gerre Scull, RN, BSN;Casey Hermine Messick Belarus, RD, LDN    Virtual Visit No    Medication changes reported     No    Fall or balance concerns reported    No    Tobacco Cessation No Change    Warm-up and Cool-down Performed as group-led instruction    Resistance Training Performed Yes    VAD Patient? No    PAD/SET Patient? No      Pain Assessment   Currently in Pain? No/denies    Multiple Pain Sites No             Capillary Blood Glucose: No results found for this or any previous visit (from the past 24 hour(s)).   Exercise Prescription Changes - 01/03/23 1200       Response to Exercise   Blood Pressure (Admit) 118/60    Blood Pressure (Exercise) 132/58    Blood Pressure (Exit) 120/62    Heart Rate (Admit) 63 bpm    Heart Rate (Exercise) 132 bpm    Heart Rate (Exit) 67 bpm    Oxygen Saturation (Admit) 97 %    Oxygen Saturation (Exercise) 91 %    Oxygen Saturation (Exit) 95 %    Rating of Perceived Exertion (Exercise) 13    Perceived Dyspnea (Exercise) 3    Duration Continue with 30 min of aerobic exercise without signs/symptoms of physical distress.    Intensity THRR unchanged      Progression   Progression Continue to progress workloads to maintain intensity without signs/symptoms of physical distress.      Resistance Training    Training Prescription Yes    Weight blue bands    Reps 10-15    Time 10 Minutes      Treadmill   MPH 3.2    Grade 3    Minutes 15    METs 4.77      Bike   Level --   airdyne (no level)   Watts 77    Minutes 15             Social History   Tobacco Use  Smoking Status Never  Smokeless Tobacco Former   Types: Chew   Quit date: 05/16/1978  Tobacco Comments   occasional cigar in past; not regular smoker    Goals Met:  Independence with exercise equipment Exercise tolerated well No report of concerns or symptoms today Strength training completed today  Goals Unmet:  Not Applicable  Comments: Service time is from 1016 to 1139    Dr. Mechele Collin is Medical Director for Pulmonary Rehab at [facility].

## 2023-01-05 ENCOUNTER — Encounter (HOSPITAL_COMMUNITY)
Admission: RE | Admit: 2023-01-05 | Discharge: 2023-01-05 | Disposition: A | Discharge status: Home / Self Care | Payer: PPO | Source: Ambulatory Visit | Attending: Internal Medicine | Admitting: Internal Medicine

## 2023-01-05 DIAGNOSIS — J849 Interstitial pulmonary disease, unspecified: Secondary | ICD-10-CM

## 2023-01-05 NOTE — Progress Notes (Signed)
Daily Session Note  Patient Details  Name: Sohan Ostling MRN: [id number] Date of Birth: [birth year] Referring Provider:   Doristine Devoid Pulmonary Rehab Walk Test from 11/04/2022 in [facility] for Heart, Vascular, & Lung Health  Referring Provider Ramaswamy       Encounter Date: 01/05/2023  Check In:  Session Check In - 01/05/23 1058       Check-In   Supervising physician immediately available to respond to emergencies [facility] immediately available    Physician(s) Robin Searing, NP    [facility]    Staff Present Raford Pitcher, MS, ACSM-CEP, Exercise Physiologist;Mary Gerre Scull, RN, BSN;Madeleine Fenn Katrinka Blazing, RT;Samantha Belarus, RD, LDN;Randi [facility], ACSM-CEP, Exercise Physiologist    Virtual Visit No    Medication changes reported     No    Fall or balance concerns reported    No    Tobacco Cessation No Change    Warm-up and Cool-down Performed as group-led instruction    Resistance Training Performed Yes    VAD Patient? No    PAD/SET Patient? No      Pain Assessment   Currently in Pain? No/denies    Multiple Pain Sites No             Capillary Blood Glucose: No results found for this or any previous visit (from the past 24 hour(s)).    Social History   Tobacco Use  Smoking Status Never  Smokeless Tobacco Former   Types: Chew   Quit date: 05/16/1978  Tobacco Comments   occasional cigar in past; not regular smoker    Goals Met:  Proper associated with RPD/PD & O2 Sat Independence with exercise equipment Exercise tolerated well No report of concerns or symptoms today Strength training completed today  Goals Unmet:  Not Applicable  Comments: Service time is from 1017 to 1135.    Dr. Mechele Collin is Medical Director for Pulmonary Rehab at [facility].

## 2023-01-05 NOTE — Progress Notes (Signed)
Home Exercise Prescription I have reviewed a Home Exercise Prescription with Marikay Alar. [name] is currently exercising at home. He walks on the treadmill, outside, or uses the stepper machine at the gym. [name] exercises for 3 non-rehab days/wk for 30 min/day. [name] has access to a fitness center or other exercise places. I told [name] I was he was meeting minimum exercise guidelines. He agreed with my recommendations. I am confident in [name] completing a home exercise plan. The patient stated that their goals were to get back to hiking. We reviewed exercise guidelines, target heart rate during exercise, RPE Scale, weather conditions, endpoints for exercise, warmup and cool down. The patient is encouraged to come to me with any questions. I will continue to follow up with the patient to assist them with progression and safety. Spent 15 min with patient discussing home exercise plan and goals  Joya San, MS, ACSM-CEP 01/05/2023 3:34 PM

## 2023-01-10 ENCOUNTER — Encounter (HOSPITAL_COMMUNITY): Admission: RE | Admit: 2023-01-10 | Payer: PPO | Source: Ambulatory Visit

## 2023-01-10 DIAGNOSIS — J849 Interstitial pulmonary disease, unspecified: Secondary | ICD-10-CM | POA: Diagnosis not present

## 2023-01-10 NOTE — Progress Notes (Signed)
Daily Session Note  Patient Details  Name: Troy Willis MRN: [id number] Date of Birth: 1946-03-25 Referring Provider:   Doristine Devoid Pulmonary Rehab Walk Test from 11/04/2022 in [facility] for Heart, Vascular, & Lung Health  Referring Provider Ramaswamy       Encounter Date: 01/10/2023  Check In:  Session Check In - 01/10/23 1212       Check-In   Supervising physician immediately available to respond to emergencies [facility] immediately available    Physician(s) Carlyon Shadow, NP    [facility]    Staff Present Samantha Belarus, RD, Dutch Gray, RN, BSN;Randi Reeve BS, ACSM-CEP, Exercise Physiologist;Kaylee Earlene Plater, MS, ACSM-CEP, Exercise Physiologist;Alisah Grandberry Katrinka Blazing, RT    Virtual Visit No    Medication changes reported     No    Fall or balance concerns reported    No    Tobacco Cessation No Change    Warm-up and Cool-down Performed as group-led instruction    Resistance Training Performed Yes    VAD Patient? No    PAD/SET Patient? No      Pain Assessment   Currently in Pain? No/denies    Multiple Pain Sites No             Capillary Blood Glucose: No results found for this or any previous visit (from the past 24 hour(s)).    Social History   Tobacco Use  Smoking Status Never  Smokeless Tobacco Former   Types: Chew   Quit date: 05/16/1978  Tobacco Comments   occasional cigar in past; not regular smoker    Goals Met:  Proper associated with RPD/PD & O2 Sat Independence with exercise equipment Exercise tolerated well No report of concerns or symptoms today Strength training completed today  Goals Unmet:  Not Applicable  Comments: Service time is from 1013 to 1110.    Dr. Mechele Collin is Medical Director for Pulmonary Rehab at [facility].

## 2023-01-12 ENCOUNTER — Encounter (HOSPITAL_COMMUNITY)
Admission: RE | Admit: 2023-01-12 | Discharge: 2023-01-12 | Disposition: A | Discharge status: Home / Self Care | Payer: PPO | Source: Ambulatory Visit | Attending: Internal Medicine | Admitting: Internal Medicine

## 2023-01-12 DIAGNOSIS — S61012A Laceration without foreign body of left thumb without damage to nail, initial encounter: Secondary | ICD-10-CM | POA: Diagnosis not present

## 2023-01-12 DIAGNOSIS — J849 Interstitial pulmonary disease, unspecified: Secondary | ICD-10-CM

## 2023-01-12 NOTE — Progress Notes (Signed)
Daily Session Note  Patient Details  Name: Troy Willis MRN: [id number] Date of Birth: Aug 08, 1945 Referring Provider:   Doristine Devoid Pulmonary Rehab Walk Test from 11/04/2022 in [facility] for Heart, Vascular, & Lung Health  Referring Provider Ramaswamy       Encounter Date: 01/12/2023  Check In:  Session Check In - 01/12/23 1133       Check-In   Supervising physician immediately available to respond to emergencies [facility] immediately available    Physician(s) Bernadene Person, NP    [facility]    Staff Present Elissa Lovett BS, ACSM-CEP, Exercise Physiologist;Starleen Trussell Earlene Plater, MS, ACSM-CEP, Exercise Physiologist;Casey Thedore Mins, RN, BSN    Virtual Visit No    Medication changes reported     No    Fall or balance concerns reported    No    Tobacco Cessation No Change    Warm-up and Cool-down Performed as group-led instruction    Resistance Training Performed Yes    VAD Patient? No    PAD/SET Patient? No      Pain Assessment   Currently in Pain? No/denies             Capillary Blood Glucose: No results found for this or any previous visit (from the past 24 hour(s)).    Social History   Tobacco Use  Smoking Status Never  Smokeless Tobacco Former   Types: Chew   Quit date: 05/16/1978  Tobacco Comments   occasional cigar in past; not regular smoker    Goals Met:  Proper associated with RPD/PD & O2 Sat Independence with exercise equipment Exercise tolerated well No report of concerns or symptoms today Strength training completed today  Goals Unmet:  Not Applicable  Comments: Service time is from 1016 to 1152.    Dr. Mechele Collin is Medical Director for Pulmonary Rehab at [facility].

## 2023-01-17 ENCOUNTER — Encounter (HOSPITAL_COMMUNITY)
Admission: RE | Admit: 2023-01-17 | Discharge: 2023-01-17 | Disposition: A | Discharge status: Home / Self Care | Payer: PPO | Source: Ambulatory Visit | Attending: Internal Medicine | Admitting: Internal Medicine

## 2023-01-17 VITALS — Wt 165.8 lb

## 2023-01-17 DIAGNOSIS — J849 Interstitial pulmonary disease, unspecified: Secondary | ICD-10-CM | POA: Diagnosis not present

## 2023-01-17 NOTE — Progress Notes (Signed)
Daily Session Note  Patient Details  Name: Troy Willis MRN: [id number] Date of Birth: 1946/05/09 Referring Provider:   Doristine Devoid Pulmonary Rehab Walk Test from 11/04/2022 in [facility] for Heart, Vascular, & Lung Health  Referring Provider Ramaswamy       Encounter Date: 01/17/2023  Check In:  Session Check In - 01/17/23 1128       Check-In   Supervising physician immediately available to respond to emergencies [facility] immediately available    Physician(s) Bernadene Person, NP    [facility]    Staff Present Elissa Lovett BS, ACSM-CEP, Exercise Physiologist;Kaylee Earlene Plater, MS, ACSM-CEP, Exercise Physiologist;Casey Hermine Messick Belarus, RD, Dutch Gray, RN, BSN    Virtual Visit No    Medication changes reported     No    Fall or balance concerns reported    No    Tobacco Cessation No Change    Warm-up and Cool-down Performed as group-led instruction    Resistance Training Performed Yes    VAD Patient? No    PAD/SET Patient? No      Pain Assessment   Currently in Pain? No/denies    Multiple Pain Sites No             Capillary Blood Glucose: No results found for this or any previous visit (from the past 24 hour(s)).   Exercise Prescription Changes - 01/17/23 1200       Response to Exercise   Blood Pressure (Admit) 108/60    Blood Pressure (Exercise) 130/55    Blood Pressure (Exit) 92/62    Heart Rate (Admit) 54 bpm    Heart Rate (Exercise) 109 bpm    Heart Rate (Exit) 66 bpm    Oxygen Saturation (Admit) 97 %    Oxygen Saturation (Exercise) 94 %    Oxygen Saturation (Exit) 94 %    Rating of Perceived Exertion (Exercise) 16    Perceived Dyspnea (Exercise) 2    Duration Continue with 30 min of aerobic exercise without signs/symptoms of physical distress.    Intensity THRR unchanged      Progression   Progression Continue to progress workloads to maintain intensity without signs/symptoms of physical  distress.      Resistance Training   Training Prescription Yes    Weight black bands    Reps 10-15    Time 10 Minutes      Treadmill   MPH 3.2    Grade 3    Minutes 15    METs 4.77      Bike   Level --   48 rpm   Watts 79    Minutes 15             Social History   Tobacco Use  Smoking Status Never  Smokeless Tobacco Former   Types: Chew   Quit date: 05/16/1978  Tobacco Comments   occasional cigar in past; not regular smoker    Goals Met:  Independence with exercise equipment Exercise tolerated well No report of concerns or symptoms today Strength training completed today  Goals Unmet:  Not Applicable  Comments: Service time is from 1015 to 1138.    Dr. Mechele Collin is Medical Director for Pulmonary Rehab at  [facility]

## 2023-01-18 NOTE — Progress Notes (Signed)
Pulmonary Individual Treatment Plan  Patient Details  Name: Troy Willis MRN: [id number] Date of Birth: 09/07/1945 Referring Provider:   Doristine Devoid Pulmonary Rehab Walk Test from 11/04/2022 in [facility]  Referring Provider Ramaswamy       Initial Encounter Date:  Flowsheet Row Pulmonary Rehab Walk Test from 11/04/2022 in [facility]  Date 11/04/22       Visit Diagnosis: ILD (interstitial lung disease) (HCC)  Patient's Home Medications on Admission:   Current Outpatient Medications:    Bempedoic Acid-Ezetimibe (NEXLIZET) 180-10 MG TABS, Take 1 tablet by mouth daily., Disp: 90 tablet, Rfl: 3   celecoxib (CELEBREX) 50 MG capsule, Take 50 mg by mouth., Disp: , Rfl:    cephALEXin (KEFLEX) 500 MG capsule, Take 1 capsule (500 mg total) by mouth 3 (three) times daily., Disp: 21 capsule, Rfl: 0   Cholecalciferol (VITAMIN D3) 2000 UNITS TABS, Take 2,000 Units by mouth daily. , Disp: , Rfl:    COVID-19 mRNA vaccine 2023-2024 (COMIRNATY) SUSP injection, Inject into the muscle., Disp: 0.3 mL, Rfl: 0   famotidine (PEPCID) 20 MG tablet, Take 20 mg by mouth daily as needed for heartburn or indigestion., Disp: , Rfl:    furosemide (LASIX) 20 MG tablet, Take 1 tablet (20 mg total) by mouth daily., Disp: 90 tablet, Rfl: 3   HYDROCODONE-GUAIFENESIN PO, Take by mouth., Disp: , Rfl:    hydrocortisone 2.5 % ointment, Apply 1 application topically 2 (two) times daily as needed (irritation). , Disp: , Rfl:    influenza vaccine adjuvanted (FLUAD QUADRIVALENT) 0.5 ML injection, Inject into the muscle., Disp: 0.5 mL, Rfl: 0   Melatonin 5 MG CAPS, Take 5 mg by mouth at bedtime., Disp: , Rfl:    nitroGLYCERIN (NITROSTAT) 0.4 MG SL tablet, Place 1 tablet (0.4 mg total) under the tongue every 5 (five) minutes x 3 doses as needed for chest pain., Disp: 25 tablet, Rfl: 3   Respiratory  Therapy Supplies (FLUTTER) DEVI, Use as directed, Disp: 1 each, Rfl: 0   rivaroxaban (XARELTO) 20 MG TABS tablet, Take 1 tablet (20 mg total) by mouth daily with supper., Disp: 90 tablet, Rfl: 1   RSV vaccine recomb adjuvanted (AREXVY) 120 MCG/0.5ML injection, Inject into the muscle., Disp: 0.5 mL, Rfl: 0   triamcinolone cream (KENALOG) 0.1 %, Apply 1 application topically daily as needed (rash). , Disp: , Rfl:  No current facility-administered medications for this encounter.  Facility-Administered Medications Ordered in Other Encounters:    gemcitabine (GEMZAR) chemo syringe for bladder instillation 2,000 mg, 2,000 mg, Bladder Instillation, Once, Bjorn Pippin, MD  Past Medical History: Past Medical History:  Diagnosis Date   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 08/2015 Cath/PCI: LM nl, LAD 59m, LCX small, nl, RCA 80p (4.0x16 Synergy DES),    Cancer (HCC)    skin on forehead   Cataracts, bilateral    surgery   Depression    pt denies    Diverticulosis    Dyspnea    occasional    Family history of pulmonary fibrosis    GERD (gastroesophageal reflux disease)    Hilar adenopathy  a. 08/2015 CT Chest: mild bilat hilar adenopathy and mild soft tissue prominence in inf right hilum, left hilum. Also areas of soft tissue and low-density material filling LLL bronchial airways->? mucous vs mass.   Hx of adenomatous colonic polyps 08/13/2015   Hyperlipidemia    MVP (mitral valve prolapse)    Mild to moderate MR by echo 07/2022   Myocardial infarction [facility])    Nonspecific elevation of levels of transaminase or lactic acid dehydrogenase (LDH)    PMH of ; ? 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 CHA2DS2VASc = 2 -->Xarelto started  08/2015.   Pneumonia, bacterial 05/18/2012   Pulmonary HTN (HCC)    PASP on echo 08/2019>>Likely Group 3 related to bronchiectasis>>repeat echo 09/2020 with PASP   Pulmonary nodules    a. 08/2015 CT Chest: reticulonodular opacities in RUL - largest 5mm - likely 2/2 inflammatory process.   Tricuspid regurgitation     Tobacco Use: Social History   Tobacco Use  Smoking Status Never  Smokeless Tobacco Former   Types: Chew   Quit date: 05/16/1978  Tobacco Comments   occasional cigar in past; not regular smoker    Labs: Review Flowsheet  More data exists      Latest Ref Rng & Units 04/01/2021 07/12/2021 09/10/2021 09/28/2022 11/30/2022  Labs for ITP Cardiac and Pulmonary Rehab  Cholestrol 100 - 199 mg/dL 166  063  016  010  932   LDL (calc) 0 - 99 mg/dL 54  95  64  355  66   HDL-C >39 mg/dL 64  65  63  61  57   Trlycerides 0 - 149 mg/dL 69  55  39  92  54     Details            Capillary Blood Glucose: No results found for: "GLUCAP"   Pulmonary Assessment Scores:  Pulmonary Assessment Scores     Row Name 11/04/22 1159         ADL UCSD   ADL Phase Entry     SOB Score total 26       CAT Score   CAT Score 16       mMRC Score   mMRC Score 2             UCSD: Self-administered rating of dyspnea associated with activities of daily living (ADLs) 6-point scale (0 = "not at all" to 5 = "maximal or unable to do because of breathlessness")  Scoring Scores range from 0 to 120.  Minimally important difference is 5 units  CAT: CAT can identify the health impairment of COPD patients and is better correlated with disease progression.  CAT has a scoring range of zero to 40. The CAT score is classified into four groups of low (less than 10), medium (10 - 20), high (21-30) and very high (31-40) based on the impact level of disease on health status. A CAT score over 10 suggests significant symptoms.  A worsening CAT score could be explained by an exacerbation, poor  medication adherence, poor inhaler technique, or progression of COPD or comorbid conditions.  CAT MCID is 2 points  mMRC: mMRC (Modified Medical Research Council) Dyspnea Scale is used to assess the degree of baseline functional disability in patients of respiratory disease due to dyspnea. No minimal important difference is established. A decrease in score of 1 point or greater is considered a positive change.  Pulmonary Function Assessment:  Pulmonary Function Assessment - 11/04/22 1110       Breath   Bilateral Breath Sounds Rales;Basilar    Shortness of Breath Limiting activity;Yes             Exercise Target Goals: Exercise Program Goal: Individual exercise prescription set using results from initial 6 min walk test and THRR while considering  patient's activity barriers and safety.   Exercise Prescription Goal: Initial exercise prescription builds to 30-45 minutes a day of aerobic activity, 2-3 days per week.  Home exercise guidelines will be given to patient during program as part of exercise prescription that the participant will acknowledge.  Activity Barriers & Risk Stratification:  Activity Barriers & Cardiac Risk Stratification - 11/04/22 1157       Activity Barriers & Cardiac Risk Stratification   Activity Barriers Arthritis;Muscular Weakness;Deconditioning;Shortness of Breath    Cardiac Risk Stratification Low             6 Minute Walk:  6 Minute Walk     Row Name 11/04/22 1159         6 Minute Walk   Distance 1825 feet     Walk Time 6 minutes     # of Rest Breaks 0     MPH 3.46     METS 3.99     RPE 13     Perceived Dyspnea  2     VO2 Peak 13.98     Symptoms No     Resting HR 63 bpm     Resting BP 110/58     Resting Oxygen Saturation  100 %     Exercise Oxygen Saturation  during 6 min walk 91 %     Max Ex. HR 112 bpm     Max Ex. BP 132/64     2 Minute Post BP 114/70       Interval HR   1 Minute HR 68     2 Minute HR 108     3 Minute  HR 104     4 Minute HR 102     5 Minute HR 112     6 Minute HR 104     2 Minute Post HR 62     Interval Heart Rate? Yes       Interval Oxygen   Interval Oxygen? Yes     Baseline Oxygen Saturation % 100 %     1 Minute Oxygen Saturation % 97 %     1 Minute Liters of Oxygen 0 L     2 Minute Oxygen Saturation % 95 %     2 Minute Liters of Oxygen 0 L     3 Minute Oxygen Saturation % 93 %     3 Minute Liters of Oxygen 0 L     4 Minute Oxygen Saturation % 91 %     4 Minute Liters of Oxygen 0 L     5 Minute Oxygen Saturation % 91 %     5 Minute Liters of Oxygen 0 L     6 Minute Oxygen Saturation % 92 %     6 Minute Liters of Oxygen 0 L     2 Minute Post Oxygen Saturation % 98 %     2 Minute Post Liters of Oxygen 0 L              Oxygen Initial Assessment:  Oxygen Initial Assessment - 11/04/22 1109  Home Oxygen   Home Oxygen Device None    Sleep Oxygen Prescription CPAP    Home Exercise Oxygen Prescription None    Home Resting Oxygen Prescription None      Initial 6 min Walk   Oxygen Used None      Program Oxygen Prescription   Program Oxygen Prescription None      Intervention   Short Term Goals To learn and exhibit compliance with exercise, home and travel O2 prescription;To learn and understand importance of maintaining oxygen saturations>88%;To learn and understand importance of monitoring SPO2 with pulse oximeter and demonstrate accurate use of the pulse oximeter.;To learn and demonstrate proper pursed lip breathing techniques or other breathing techniques.     Long  Term Goals Exhibits compliance with exercise, home  and travel O2 prescription;Maintenance of O2 saturations>88%;Verbalizes importance of monitoring SPO2 with pulse oximeter and return demonstration;Exhibits proper breathing techniques, such as pursed lip breathing or other method taught during program session             Oxygen Re-Evaluation:  Oxygen Re-Evaluation     Row Name 11/18/22 0946  12/09/22 1300 01/11/23 1157         Program Oxygen Prescription   Program Oxygen Prescription None None None       Home Oxygen   Home Oxygen Device None None None     Sleep Oxygen Prescription CPAP CPAP CPAP     Home Exercise Oxygen Prescription None None None     Home Resting Oxygen Prescription None None None       Goals/Expected Outcomes   Short Term Goals To learn and exhibit compliance with exercise, home and travel O2 prescription;To learn and understand importance of maintaining oxygen saturations>88%;To learn and understand importance of monitoring SPO2 with pulse oximeter and demonstrate accurate use of the pulse oximeter.;To learn and demonstrate proper pursed lip breathing techniques or other breathing techniques.  To learn and exhibit compliance with exercise, home and travel O2 prescription;To learn and understand importance of maintaining oxygen saturations>88%;To learn and understand importance of monitoring SPO2 with pulse oximeter and demonstrate accurate use of the pulse oximeter.;To learn and demonstrate proper pursed lip breathing techniques or other breathing techniques.  To learn and exhibit compliance with exercise, home and travel O2 prescription;To learn and understand importance of maintaining oxygen saturations>88%;To learn and understand importance of monitoring SPO2 with pulse oximeter and demonstrate accurate use of the pulse oximeter.;To learn and demonstrate proper pursed lip breathing techniques or other breathing techniques.      Long  Term Goals Exhibits compliance with exercise, home  and travel O2 prescription;Maintenance of O2 saturations>88%;Verbalizes importance of monitoring SPO2 with pulse oximeter and return demonstration;Exhibits proper breathing techniques, such as pursed lip breathing or other method taught during program session Exhibits compliance with exercise, home  and travel O2 prescription;Maintenance of O2 saturations>88%;Verbalizes importance of  monitoring SPO2 with pulse oximeter and return demonstration;Exhibits proper breathing techniques, such as pursed lip breathing or other method taught during program session Exhibits compliance with exercise, home  and travel O2 prescription;Maintenance of O2 saturations>88%;Verbalizes importance of monitoring SPO2 with pulse oximeter and return demonstration;Exhibits proper breathing techniques, such as pursed lip breathing or other method taught during program session     Goals/Expected Outcomes Compliance and understanding of oxygen saturation monitoring and breathing techniques to decrease shortness of breath. Compliance and understanding of oxygen saturation monitoring and breathing techniques to decrease shortness of breath. Compliance and understanding of oxygen saturation monitoring and breathing techniques to decrease  shortness of breath.              Oxygen Discharge (Final Oxygen Re-Evaluation):  Oxygen Re-Evaluation - 01/11/23 1157       Program Oxygen Prescription   Program Oxygen Prescription None      Home Oxygen   Home Oxygen Device None    Sleep Oxygen Prescription CPAP    Home Exercise Oxygen Prescription None    Home Resting Oxygen Prescription None      Goals/Expected Outcomes   Short Term Goals To learn and exhibit compliance with exercise, home and travel O2 prescription;To learn and understand importance of maintaining oxygen saturations>88%;To learn and understand importance of monitoring SPO2 with pulse oximeter and demonstrate accurate use of the pulse oximeter.;To learn and demonstrate proper pursed lip breathing techniques or other breathing techniques.     Long  Term Goals Exhibits compliance with exercise, home  and travel O2 prescription;Maintenance of O2 saturations>88%;Verbalizes importance of monitoring SPO2 with pulse oximeter and return demonstration;Exhibits proper breathing techniques, such as pursed lip breathing or other method taught during program  session    Goals/Expected Outcomes Compliance and understanding of oxygen saturation monitoring and breathing techniques to decrease shortness of breath.             Initial Exercise Prescription:  Initial Exercise Prescription - 11/04/22 1200       Date of Initial Exercise RX and Referring Provider   Date 11/04/22    Referring Provider Ramaswamy    Expected Discharge Date 02/02/23      Elliptical   Level 1    Speed 60    Minutes 15    METs 2      Rower   Level 1    Watts 15    Minutes 15      Prescription Details   Frequency (times per week) 2    Duration Progress to 30 minutes of continuous aerobic without signs/symptoms of physical distress      Intensity   THRR 40-80% of Max Heartrate 57-114    Ratings of Perceived Exertion 11-13    Perceived Dyspnea 0-4      Progression   Progression Continue to progress workloads to maintain intensity without signs/symptoms of physical distress.      Resistance Training   Training Prescription Yes    Weight --   red bands   Reps 10-15             Perform Capillary Blood Glucose checks as needed.  Exercise Prescription Changes:   Exercise Prescription Changes     Row Name 11/22/22 1200 12/06/22 1200 12/20/22 1100 01/03/23 1200 01/17/23 1200     Response to Exercise   Blood Pressure (Admit) 112/60 124/60 122/64 118/60 108/60   Blood Pressure (Exercise) 128/58 124/60 136/60 132/58 130/55   Blood Pressure (Exit) 100/56 96/50 92/62  120/62 92/62   Heart Rate (Admit) 61 bpm 60 bpm 58 bpm 63 bpm 54 bpm   Heart Rate (Exercise) 117 bpm 109 bpm 95 bpm 132 bpm 109 bpm   Heart Rate (Exit) 70 bpm 67 bpm 69 bpm 67 bpm 66 bpm   Oxygen Saturation (Admit) 99 % 96 % 98 % 97 % 97 %   Oxygen Saturation (Exercise) 92 % 95 % 94 % 91 % 94 %   Oxygen Saturation (Exit) 96 % 97 % 97 % 95 % 94 %   Rating of Perceived Exertion (Exercise) 15 14 13 13 16    Perceived Dyspnea (Exercise) 2 3 2  3 2   Duration Progress to 30 minutes of   aerobic without signs/symptoms of physical distress Progress to 30 minutes of  aerobic without signs/symptoms of physical distress Continue with 30 min of aerobic exercise without signs/symptoms of physical distress. Continue with 30 min of aerobic exercise without signs/symptoms of physical distress. Continue with 30 min of aerobic exercise without signs/symptoms of physical distress.   Intensity THRR unchanged THRR unchanged THRR unchanged THRR unchanged THRR unchanged     Progression   Progression Continue to progress workloads to maintain intensity without signs/symptoms of physical distress. Continue to progress workloads to maintain intensity without signs/symptoms of physical distress. -- Continue to progress workloads to maintain intensity without signs/symptoms of physical distress. Continue to progress workloads to maintain intensity without signs/symptoms of physical distress.     Resistance Training   Training Prescription Yes Yes Yes Yes Yes   Weight blue bands blue bands blue bands blue bands black bands   Reps 10-15 10-15 10-15 10-15 10-15   Time 10 Minutes 10 Minutes 10 Minutes 10 Minutes 10 Minutes     Treadmill   MPH -- -- 3 3.2 3.2   Grade -- -- 2 3 3    Minutes -- -- 15 15 15    METs -- -- 4.12 4.77 4.77     Bike   Level -- -- --  airdyne --  airdyne (no level) --  48 rpm   Watts 50 68 63 77 79   Minutes 15 15 15 15 15      Rower   Level 4 5 -- -- --   Watts 55 56 -- -- --   Minutes 15 15 -- -- --            Exercise Comments:   Exercise Comments     Row Name 11/10/22 1135 01/05/23 1525         Exercise Comments Pt completed his first day of group exercise. He exercised on the upright elliptical speed 4, incline 2, METs 4.8, for 15 min. He then exercised on the rower at level 2 for 15 min, avg watts 35. Pt tolerated well although more tired on the rower. He performed warm up and cool down without limitation, including squats. He is motivated. Discussed METs  with good understanding. Completed home ExRx. [name] is currently exercising at home. He walks on the treadmill, outside, or uses the stepper machine at the gym. [name] exercises for 3 non-rehab days/wk for 30 min/day. I told [name] I was he was meeting minimum exercise guidelines. I discussed safety guidelines with [name]. He agreed with my recommendations. I am confident in [name] completing a home exercise plan.               Exercise Goals and Review:   Exercise Goals     Row Name 11/04/22 1158 11/18/22 0943 12/09/22 1254 01/11/23 1142       Exercise Goals   Increase Physical Activity Yes Yes Yes Yes    Intervention Provide advice, education, support and counseling about physical activity/exercise needs.;Develop an individualized exercise prescription for aerobic and resistive training based on initial evaluation findings, risk stratification, comorbidities and participant's personal goals. Provide advice, education, support and counseling about physical activity/exercise needs.;Develop an individualized exercise prescription for aerobic and resistive training based on initial evaluation findings, risk stratification, comorbidities and participant's personal goals. Provide advice, education, support and counseling about physical activity/exercise needs.;Develop an individualized exercise prescription for aerobic and resistive training based on initial evaluation findings,  risk stratification, comorbidities and participant's personal goals. Provide advice, education, support and counseling about physical activity/exercise needs.;Develop an individualized exercise prescription for aerobic and resistive training based on initial evaluation findings, risk stratification, comorbidities and participant's personal goals.    Expected Outcomes Short Term: Attend rehab on a regular basis to increase amount of physical activity.;Long Term: Exercising regularly at least 3-5 days a week.;Long Term: Add in home exercise to  make exercise part of routine and to increase amount of physical activity. Short Term: Attend rehab on a regular basis to increase amount of physical activity.;Long Term: Exercising regularly at least 3-5 days a week.;Long Term: Add in home exercise to make exercise part of routine and to increase amount of physical activity. Short Term: Attend rehab on a regular basis to increase amount of physical activity.;Long Term: Exercising regularly at least 3-5 days a week.;Long Term: Add in home exercise to make exercise part of routine and to increase amount of physical activity. Short Term: Attend rehab on a regular basis to increase amount of physical activity.;Long Term: Exercising regularly at least 3-5 days a week.;Long Term: Add in home exercise to make exercise part of routine and to increase amount of physical activity.    Increase Strength and Stamina Yes Yes Yes Yes    Intervention Provide advice, education, support and counseling about physical activity/exercise needs.;Develop an individualized exercise prescription for aerobic and resistive training based on initial evaluation findings, risk stratification, comorbidities and participant's personal goals. Provide advice, education, support and counseling about physical activity/exercise needs.;Develop an individualized exercise prescription for aerobic and resistive training based on initial evaluation findings, risk stratification, comorbidities and participant's personal goals. Provide advice, education, support and counseling about physical activity/exercise needs.;Develop an individualized exercise prescription for aerobic and resistive training based on initial evaluation findings, risk stratification, comorbidities and participant's personal goals. Provide advice, education, support and counseling about physical activity/exercise needs.;Develop an individualized exercise prescription for aerobic and resistive training based on initial evaluation  findings, risk stratification, comorbidities and participant's personal goals.    Expected Outcomes Short Term: Increase workloads from initial exercise prescription for resistance, speed, and METs.;Short Term: Perform resistance training exercises routinely during rehab and add in resistance training at home;Long Term: Improve cardiorespiratory fitness, muscular endurance and strength as measured by increased METs and functional capacity ( ) Short Term: Increase workloads from initial exercise prescription for resistance, speed, and METs.;Short Term: Perform resistance training exercises routinely during rehab and add in resistance training at home;Long Term: Improve cardiorespiratory fitness, muscular endurance and strength as measured by increased METs and functional capacity ( ) Short Term: Increase workloads from initial exercise prescription for resistance, speed, and METs.;Short Term: Perform resistance training exercises routinely during rehab and add in resistance training at home;Long Term: Improve cardiorespiratory fitness, muscular endurance and strength as measured by increased METs and functional capacity ( ) Short Term: Increase workloads from initial exercise prescription for resistance, speed, and METs.;Short Term: Perform resistance training exercises routinely during rehab and add in resistance training at home;Long Term: Improve cardiorespiratory fitness, muscular endurance and strength as measured by increased METs and functional capacity ( )    Able to understand and use rate of perceived exertion (RPE) scale Yes Yes Yes Yes    Intervention Provide education and explanation on how to use RPE scale Provide education and explanation on how to use RPE scale Provide education and explanation on how to use RPE scale Provide education and explanation on how to use RPE scale    Expected Outcomes  Short Term: Able to use RPE daily in rehab to express subjective intensity level;Long Term:   Able to use RPE to guide intensity level when exercising independently Short Term: Able to use RPE daily in rehab to express subjective intensity level;Long Term:  Able to use RPE to guide intensity level when exercising independently Short Term: Able to use RPE daily in rehab to express subjective intensity level;Long Term:  Able to use RPE to guide intensity level when exercising independently Short Term: Able to use RPE daily in rehab to express subjective intensity level;Long Term:  Able to use RPE to guide intensity level when exercising independently    Able to understand and use Dyspnea scale Yes Yes Yes Yes    Intervention Provide education and explanation on how to use Dyspnea scale Provide education and explanation on how to use Dyspnea scale Provide education and explanation on how to use Dyspnea scale Provide education and explanation on how to use Dyspnea scale    Expected Outcomes Short Term: Able to use Dyspnea scale daily in rehab to express subjective sense of shortness of breath during exertion;Long Term: Able to use Dyspnea scale to guide intensity level when exercising independently Short Term: Able to use Dyspnea scale daily in rehab to express subjective sense of shortness of breath during exertion;Long Term: Able to use Dyspnea scale to guide intensity level when exercising independently Short Term: Able to use Dyspnea scale daily in rehab to express subjective sense of shortness of breath during exertion;Long Term: Able to use Dyspnea scale to guide intensity level when exercising independently Short Term: Able to use Dyspnea scale daily in rehab to express subjective sense of shortness of breath during exertion;Long Term: Able to use Dyspnea scale to guide intensity level when exercising independently    Knowledge and understanding of Target Heart Rate Range (THRR) Yes Yes Yes Yes    Intervention Provide education and explanation of THRR including how the numbers were predicted and  where they are located for reference Provide education and explanation of THRR including how the numbers were predicted and where they are located for reference Provide education and explanation of THRR including how the numbers were predicted and where they are located for reference Provide education and explanation of THRR including how the numbers were predicted and where they are located for reference    Expected Outcomes Short Term: Able to state/look up THRR;Long Term: Able to use THRR to govern intensity when exercising independently;Short Term: Able to use daily as guideline for intensity in rehab Short Term: Able to state/look up THRR;Long Term: Able to use THRR to govern intensity when exercising independently;Short Term: Able to use daily as guideline for intensity in rehab Short Term: Able to state/look up THRR;Long Term: Able to use THRR to govern intensity when exercising independently;Short Term: Able to use daily as guideline for intensity in rehab Short Term: Able to state/look up THRR;Long Term: Able to use THRR to govern intensity when exercising independently;Short Term: Able to use daily as guideline for intensity in rehab    Understanding of Exercise Prescription Yes Yes Yes Yes    Intervention Provide education, explanation, and written materials on patient's individual exercise prescription Provide education, explanation, and written materials on patient's individual exercise prescription Provide education, explanation, and written materials on patient's individual exercise prescription Provide education, explanation, and written materials on patient's individual exercise prescription    Expected Outcomes Short Term: Able to explain program exercise prescription;Long Term: Able to explain home exercise  prescription to exercise independently Short Term: Able to explain program exercise prescription;Long Term: Able to explain home exercise prescription to exercise independently Short Term:  Able to explain program exercise prescription;Long Term: Able to explain home exercise prescription to exercise independently Short Term: Able to explain program exercise prescription;Long Term: Able to explain home exercise prescription to exercise independently             Exercise Goals Re-Evaluation :  Exercise Goals Re-Evaluation     Row Name 11/18/22 0943 12/09/22 1255 01/11/23 1142         Exercise Goal Re-Evaluation   Exercise Goals Review Increase Physical Activity;Able to understand and use Dyspnea scale;Understanding of Exercise Prescription;Increase Strength and Stamina;Knowledge and understanding of Target Heart Rate Range (THRR);Able to understand and use rate of perceived exertion (RPE) scale Increase Physical Activity;Able to understand and use Dyspnea scale;Understanding of Exercise Prescription;Increase Strength and Stamina;Knowledge and understanding of Target Heart Rate Range (THRR);Able to understand and use rate of perceived exertion (RPE) scale Increase Physical Activity;Able to understand and use Dyspnea scale;Understanding of Exercise Prescription;Increase Strength and Stamina;Knowledge and understanding of Target Heart Rate Range (THRR);Able to understand and use rate of perceived exertion (RPE) scale     Comments [name] has completed 2 exercise sessions. He exercises for 15 min on the upright elliptical and rower. [name] averages 4.8 METs at 2 speed and 4 incline on the upright elliptical and 50 watts at level 4 on the rower. He performs the warmup and cooldown standing without limitations. It is too soon to note any discernable progressions. Will continue to monitor and progress as able. [name] has completed 8 exercise sessions. He exercises for 15 min on the treadmill and airdyne. [name] averages 4.77 METs at 3.2 mph and 3% incline and 65 watts on the airdyne. He performs the warmup and cooldown standing without limitations. He has progressed from the upright elliptical to the  treadmill and rower to airdyne. Both exercise modes are more comfortable for [name] and more difficult. He enjoys these exercise modes more and tolerates both well. He recently started both exercise modes as it is too soon to note any progressions for the treadmil and airdyne. [name] seems very motivated to exercise and improve his functional capacity. Will continue to monitor and progress as able. [name] has completed 16 exercise sessions. He exercises for 15 min on the treadmill and airdyne. [name] averages 4.77 METs at 3.2 mph and 3% incline and 78 watts/ 47 rpm on the airdyne. He performs the warmup and cooldown standing without limitations. [name] has increased his watts on the airdyne. He tolerates both exercise modes well. He is possibly reaching his functional capacity. [name] also exercises outside of rehab. He is meeting minimum exercise guidelines. I am confident in [name] completing an exercise regimen at home. Will continue to monitor and progress as able.     Expected Outcomes Through exercise at rehab and home, the patient will decrease shortness of breath with daily activities and feel confident in carrying out an exercise regimen at home. Through exercise at rehab and home, the patient will decrease shortness of breath with daily activities and feel confident in carrying out an exercise regimen at home. Through exercise at rehab and home, the patient will decrease shortness of breath with daily activities and feel confident in carrying out an exercise regimen at home.              Discharge Exercise Prescription (Final Exercise Prescription Changes):  Exercise Prescription Changes -  01/17/23 1200       Response to Exercise   Blood Pressure (Admit) 108/60    Blood Pressure (Exercise) 130/55    Blood Pressure (Exit) 92/62    Heart Rate (Admit) 54 bpm    Heart Rate (Exercise) 109 bpm    Heart Rate (Exit) 66 bpm    Oxygen Saturation (Admit) 97 %    Oxygen Saturation (Exercise) 94 %    Oxygen Saturation  (Exit) 94 %    Rating of Perceived Exertion (Exercise) 16    Perceived Dyspnea (Exercise) 2    Duration Continue with 30 min of aerobic exercise without signs/symptoms of physical distress.    Intensity THRR unchanged      Progression   Progression Continue to progress workloads to maintain intensity without signs/symptoms of physical distress.      Resistance Training   Training Prescription Yes    Weight black bands    Reps 10-15    Time 10 Minutes      Treadmill   MPH 3.2    Grade 3    Minutes 15    METs 4.77      Bike   Level --   48 rpm   Watts 79    Minutes 15             Nutrition:  Target Goals: Understanding of nutrition guidelines, daily intake of sodium 1500mg , cholesterol 200mg , calories 30% from fat and 7% or less from saturated fats, daily to have 5 or more servings of fruits and vegetables.  Biometrics:  Pre Biometrics - 11/04/22 1216       Pre Biometrics   Grip Strength 40 kg              Nutrition Therapy Plan and Nutrition Goals:  Nutrition Therapy & Goals - 01/05/23 1348       Nutrition Therapy   Diet Heart Healthy Diet      Personal Nutrition Goals   Nutrition Goal Patient to limit sodium 2300mg  per day   goal in progress.   Personal Goal #2 Patient to maintain weight or identify strategies for weight gain as needed of 0.5-2.0# while enrolled in pulmonary rehab.   goal in progress.   Comments Goals in progress. [name] lives at [facility] assisted living with his wife. He is provided lunch and dinner daily and does eat breakfast in his room daily. He is up 9.9# (~1.2#/week) since starting with our program; BMI now 22.0. He recently stopped salting his food approximately two weeks ago and has decreased high salt snacks (saltines, chips, etc). [name] will continue to benefit from participation in pulmonary rehab for nutrition, exercise, and lifestyle changes.      Intervention Plan   Intervention Prescribe, educate and counsel regarding  individualized specific dietary modifications aiming towards targeted core components such as weight, hypertension, lipid management, diabetes, heart failure and other comorbidities.;Nutrition handout(s) given to patient.    Expected Outcomes Short Term Goal: Understand basic principles of dietary content, such as calories, fat, sodium, cholesterol and nutrients.;Long Term Goal: Adherence to prescribed nutrition plan.             Nutrition Assessments:  Nutrition Assessments - 11/10/22 1624       Rate Your Plate Scores   Pre Score 51            MEDIFICTS Score Key: ?70 Need to make dietary changes  40-70 Heart Healthy Diet ? 40 Therapeutic Level Cholesterol Diet  Flowsheet Row PULMONARY REHAB  OTHER RESPIRATORY from 11/10/2022 in [facility] for Heart, Vascular, & Lung Health  Picture Your Plate Total Score on Admission 51      Picture Your Plate Scores: <40 Unhealthy dietary pattern with much room for improvement. 41-50 Dietary pattern unlikely to meet recommendations for good health and room for improvement. 51-60 More healthful dietary pattern, with some room for improvement.  >60 Healthy dietary pattern, although there may be some specific behaviors that could be improved.    Nutrition Goals Re-Evaluation:  Nutrition Goals Re-Evaluation     Row Name 11/10/22 1105 12/08/22 1132 01/05/23 1348         Goals   Current Weight 157 lb 3 oz (71.3 kg) 162 lb 4.1 oz (73.6 kg) 167 lb 5.3 oz (75.9 kg)     Comment LDL 101, ferritin WNL lipids WNL no new labs; most recent labs lipids WNL     Expected Outcome [name] lives at [place] assisted living with his wife. He is provided lunch and dinner daily and does eat breakfast in his room daily. He reports stable appetite and weight at this time; he is not interested in weight gain at this time. He recently stopped salting his food approximately two weeks ago and has decreased high salt snacks (saltines, chips,  etc). [name] will continue to benefit from participation in pulmonary rehab for nutrition, exercise, and lifestyle changes. Goals in progress. [name] lives at [place] assisted living with his wife. He is provided lunch and dinner daily and does eat breakfast in his room daily. He is up 4.8# (~1#/week) since starting with our program. He recently stopped salting his food approximately two weeks ago and has decreased high salt snacks (saltines, chips, etc). [name] will continue to benefit from participation in pulmonary rehab for nutrition, exercise, and lifestyle changes. Goals in progress. [name] lives at [place] assisted living with his wife. He is provided lunch and dinner daily and does eat breakfast in his room daily. He is up 9.9# (~1.2#/week) since starting with our program; BMI now 22.0. He recently stopped salting his food approximately two weeks ago and has decreased high salt snacks (saltines, chips, etc). [name] will continue to benefit from participation in pulmonary rehab for nutrition, exercise, and lifestyle changes.              Nutrition Goals Discharge (Final Nutrition Goals Re-Evaluation):  Nutrition Goals Re-Evaluation - 01/05/23 1348       Goals   Current Weight 167 lb 5.3 oz (75.9 kg)    Comment no new labs; most recent labs lipids WNL    Expected Outcome Goals in progress. [name] lives at [place] assisted living with his wife. He is provided lunch and dinner daily and does eat breakfast in his room daily. He is up 9.9# (~1.2#/week) since starting with our program; BMI now 22.0. He recently stopped salting his food approximately two weeks ago and has decreased high salt snacks (saltines, chips, etc). [name] will continue to benefit from participation in pulmonary rehab for nutrition, exercise, and lifestyle changes.             Psychosocial: Target Goals: Acknowledge presence or absence of significant depression and/or stress, maximize coping skills, provide positive support system.  Participant is able to verbalize types and ability to use techniques and skills needed for reducing stress and depression.  Initial Review & Psychosocial Screening:  Initial Psych Review & Screening - 11/04/22 1102       Initial Review   Current  issues with Current Stress Concerns    Source of Stress Concerns Family    Comments [name]'s stress comes from being his wifes caretaker during her cancer diagnosis. He also sold his home of 85yrs to move into an assisted living facility. He is managing his stress well.      Family Dynamics   Good Support System? Yes      Barriers   Psychosocial barriers to participate in program The patient should benefit from training in stress management and relaxation.      Screening Interventions   Interventions Encouraged to exercise    Expected Outcomes Long Term Goal: Stressors or current issues are controlled or eliminated.             Quality of Life Scores:  Scores of 19 and below usually indicate a poorer quality of life in these areas.  A difference of  2-3 points is a clinically meaningful difference.  A difference of 2-3 points in the total score of the Quality of Life Index has been associated with significant improvement in overall quality of life, self-image, physical symptoms, and general health in studies assessing change in quality of life.  PHQ-9: Review Flowsheet  More data exists      11/04/2022 04/19/2022 09/20/2021 04/05/2021 12/11/2020  Depression screen PHQ 2/9  Decreased Interest 0 0 0 0 1  Down, Depressed, Hopeless 0 0 0 0 0  PHQ - 2 Score 0 0 0 0 1  Altered sleeping 0 0 - - -  Tired, decreased energy 1 0 - - -  Change in appetite 0 0 - - -  Feeling bad or failure about yourself  0 0 - - -  Trouble concentrating 0 0 - - -  Moving slowly or fidgety/restless 0 0 - - -  Suicidal thoughts 0 0 - - -  PHQ-9 Score 1 0 - - -  Difficult doing work/chores Somewhat difficult Not difficult at all - - -    Details            Interpretation of Total Score  Total Score Depression Severity:  1-4 = Minimal depression, 5-9 = Mild depression, 10-14 = Moderate depression, 15-19 = Moderately severe depression, 20-27 = Severe depression   Psychosocial Evaluation and Intervention:  Psychosocial Evaluation - 11/04/22 1106       Psychosocial Evaluation & Interventions   Interventions Encouraged to exercise with the program and follow exercise prescription    Comments [name] is currently taking care of his wife who has hodgkins lymphoma. He manages stress well.    Expected Outcomes For [name] to participate in PR free of any psychosocial barriers or concerns.    Continue Psychosocial Services  No Follow up required             Psychosocial Re-Evaluation:  Psychosocial Re-Evaluation     Row Name 11/21/22 0840 12/14/22 1503 01/09/23 1533         Psychosocial Re-Evaluation   Current issues with Current Stress Concerns Current Stress Concerns Current Stress Concerns     Comments [name] has recently started the program. He is still dealing with the stress of his wifes illness. Staff will educate [name] on health ways to deal with his stress. [name] denies any new psychosocial barriers or concerns at this time. He is still dealing with his wifes illness, but states she is doing better. [name] has caregiver stress. He is currently caring for his wife who has Hodgkin's lymphoma. Recently, they sold their house to  move into independent living and can transition to assisted/dependent living at the same facility. [name] states his wife has her "ups and downs". For stress relief [name]. He has support from his children and their spouses. [name] denies any needs at this time.     Expected Outcomes For [name] to participate in PR free of any psychosocial barriers or concerns. For [name] to participate in PR free of any psychosocial barriers or concerns. For [name] to participate in PR free of any psychosocial  barriers or concerns. For [name] to reduce stress through positive coping mechanisms.     Interventions Encouraged to attend Pulmonary Rehabilitation for the exercise Encouraged to attend Pulmonary Rehabilitation for the exercise Encouraged to attend Pulmonary Rehabilitation for the exercise     Continue Psychosocial Services  Follow up required by staff Follow up required by staff No Follow up required              Psychosocial Discharge (Final Psychosocial Re-Evaluation):  Psychosocial Re-Evaluation - 01/09/23 1533       Psychosocial Re-Evaluation   Current issues with Current Stress Concerns    Comments [name] has caregiver stress. He is currently caring for his wife who has Hodgkin's lymphoma. Recently, they sold their house to move into independent living and can transition to assisted/dependent living at the same facility. [name] states his wife has her "ups and downs". For stress relief [name] [place] exercise [place] He has support from his children and their spouses. [name] denies any needs at this time.    Expected Outcomes For [name] to participate in PR free of any psychosocial barriers or concerns. For [name] to reduce stress through positive coping mechanisms.    Interventions Encouraged to attend Pulmonary Rehabilitation for the exercise    Continue Psychosocial Services  No Follow up required             Education: Education Goals: Education classes will be provided on a weekly basis, covering required topics. Participant will state understanding/return demonstration of topics presented.  Learning Barriers/Preferences:  Learning Barriers/Preferences - 11/04/22 1107       Learning Barriers/Preferences   Learning Barriers Sight   has hard time remembering things   Learning Preferences Group Instruction;Skilled Demonstration             Education Topics: Introduction to Pulmonary Rehab Group instruction provided by PowerPoint, verbal discussion, and  written material to support subject matter. Instructor reviews what Pulmonary Rehab is, the purpose of the program, and how patients are referred.     Know Your Numbers Group instruction that is supported by a PowerPoint presentation. Instructor discusses importance of knowing and understanding resting, exercise, and post-exercise oxygen saturation, heart rate, and blood pressure. Oxygen saturation, heart rate, blood pressure, rating of perceived exertion, and dyspnea are reviewed along with a normal range for these values.  Flowsheet Row PULMONARY REHAB OTHER RESPIRATORY from 11/24/2022 in [facility] for Heart, Vascular, & Lung Health  Date 11/24/22  Educator EP  Instruction Review Code 1- Verbalizes Understanding       Exercise for the Pulmonary Patient Group instruction that is supported by a PowerPoint presentation. Instructor discusses benefits of exercise, core components of exercise, frequency, duration, and intensity of an exercise routine, importance of utilizing pulse oximetry during exercise, safety while exercising, and options of places to exercise outside of rehab.  Flowsheet Row PULMONARY REHAB  OTHER RESPIRATORY from 11/10/2022 in [facility] for Heart, Vascular, & Lung Health  Date 11/10/22  Educator EP  Instruction Review Code 1- Verbalizes Understanding          MET Level  Group instruction provided by PowerPoint, verbal discussion, and written material to support subject matter. Instructor reviews what METs are and how to increase METs.  Flowsheet Row PULMONARY REHAB OTHER RESPIRATORY from 01/12/2023 in [facility] for Heart, Vascular, & Lung Health  Date 01/12/23  Educator EP  Instruction Review Code 1- Verbalizes Understanding       Pulmonary Medications Verbally interactive group education provided by instructor with focus on inhaled medications and proper administration.   Anatomy and  Physiology of the Respiratory System Group instruction provided by PowerPoint, verbal discussion, and written material to support subject matter. Instructor reviews respiratory cycle and anatomical components of the respiratory system and their functions. Instructor also reviews differences in obstructive and restrictive respiratory diseases with examples of each.    Oxygen Safety Group instruction provided by PowerPoint, verbal discussion, and written material to support subject matter. There is an overview of "What is Oxygen" and "Why do we need it".  Instructor also reviews how to create a safe environment for oxygen use, the importance of using oxygen as prescribed, and the risks of noncompliance. There is a brief discussion on traveling with oxygen and resources the patient may utilize. Flowsheet Row PULMONARY REHAB OTHER RESPIRATORY from 12/01/2022 in [facility] for Heart, Vascular, & Lung Health  Date 12/01/22  Educator RN  Instruction Review Code 1- Verbalizes Understanding       Oxygen Use Group instruction provided by PowerPoint, verbal discussion, and written material to discuss how supplemental oxygen is prescribed and different types of oxygen supply systems. Resources for more information are provided.  Flowsheet Row PULMONARY REHAB OTHER RESPIRATORY from 12/08/2022 in [facility] for Heart, Vascular, & Lung Health  Date 12/08/22  Educator RT  Instruction Review Code 1- Verbalizes Understanding       Breathing Techniques Group instruction that is supported by demonstration and informational handouts. Instructor discusses the benefits of pursed lip and diaphragmatic breathing and detailed demonstration on how to perform both.     Risk Factor Reduction Group instruction that is supported by a PowerPoint presentation. Instructor discusses the definition of a risk factor, different risk factors for pulmonary disease, and how the  heart and lungs work together. Flowsheet Row PULMONARY REHAB OTHER RESPIRATORY from 01/05/2023 in [facility] for Heart, Vascular, & Lung Health  Date 01/05/23  Educator EP  Instruction Review Code 1- Verbalizes Understanding       MD Day A group question and answer session with a medical doctor that allows participants to ask questions that relate to their pulmonary disease state.   Nutrition for the Pulmonary Patient Group instruction provided by PowerPoint slides, verbal discussion, and written materials to support subject matter. The instructor gives an explanation and review of healthy diet recommendations, which includes a discussion on weight management, recommendations for fruit and vegetable consumption, as well as protein, fluid, caffeine, fiber, sodium, sugar, and alcohol. Tips for eating when patients are short of breath are discussed.    Other Education Group or individual verbal, written, or video instructions that support the educational goals of the pulmonary rehab program. Flowsheet Row PULMONARY REHAB OTHER RESPIRATORY from 12/29/2022 in [facility] for Heart, Vascular, &  Lung Health  Date 12/29/22  Educator RN  Instruction Review Code 1- Verbalizes Understanding        Knowledge Questionnaire Score:   Core Components/Risk Factors/Patient Goals at Admission:  Personal Goals and Risk Factors at Admission - 11/04/22 1108       Core Components/Risk Factors/Patient Goals on Admission    Weight Management Weight Maintenance    Improve shortness of breath with ADL's Yes    Intervention Provide education, individualized exercise plan and daily activity instruction to help decrease symptoms of SOB with activities of daily living.    Expected Outcomes Long Term: Be able to perform more ADLs without symptoms or delay the onset of symptoms;Short Term: Improve cardiorespiratory fitness to achieve a reduction of symptoms when  performing ADLs             Core Components/Risk Factors/Patient Goals Review:   Goals and Risk Factor Review     Row Name 11/21/22 0846 12/14/22 1507 01/09/23 1534         Core Components/Risk Factors/Patient Goals Review   Personal Goals Review Improve shortness of breath with ADL's Weight Management/Obesity;Improve shortness of breath with ADL's Weight Management/Obesity;Improve shortness of breath with ADL's;Develop more efficient breathing techniques such as purse lipped breathing and diaphragmatic breathing and practicing self-pacing with activity.     Review [name] has recently started the program. He is able to demonstrate purse lip breathing when he gets short of breath. He is still working on the goal to improve his shortness of breath. [name] is enjoying the program so far. We will continue to monitor his progress. Weight management goal progressing. [name] is working with dietician to find ways to maintain weight. . Goal progressing on improving his shortness of breath with ADLs. Goal is progressing for weight gain. [name] is up ~10 pounds since starting the program. Meals are provided to him at his independent living facility. He is eating more frequently and increasing his caloric intake. Goal progressing for decreasing his shortness of breath with ADLs. [name]'s oxygen has maintained >88% on room air with exertion. He states he is able to more activities with decreased shortness of breath. Goal met on developing more efficient breathing techniques such as purse lipped breathing and diaphragmatic breathing; and practicing self-pacing with activity. [name] can initiate the PLB without staff interference while exercising. [name] also knows how to pace himself when he becomes short of breath. [name] will continue to benefit from PR for nutrition, education, exercise, and lifestyle modification.     Expected Outcomes See admission goals See admission goals For [name] to improve his shortness of breath with ADLs and  continue to gain weight              Core Components/Risk Factors/Patient Goals at Discharge (Final Review):   Goals and Risk Factor Review - 01/09/23 1534       Core Components/Risk Factors/Patient Goals Review   Personal Goals Review Weight Management/Obesity;Improve shortness of breath with ADL's;Develop more efficient breathing techniques such as purse lipped breathing and diaphragmatic breathing and practicing self-pacing with activity.    Review Goal is progressing for weight gain. [name] is up ~10 pounds since starting the program. Meals are provided to him at his independent living facility. He is eating more frequently and increasing his caloric intake. Goal progressing for decreasing his shortness of breath with ADLs. [name]'s oxygen has maintained >88% on room air with exertion. He states he is able to more activities with decreased shortness of breath. Goal met  on developing more efficient breathing techniques such as purse lipped breathing and diaphragmatic breathing; and practicing self-pacing with activity. [name] can initiate the PLB without staff interference while exercising. [name] also knows how to pace himself when he becomes short of breath. [name] will continue to benefit from PR for nutrition, education, exercise, and lifestyle modification.    Expected Outcomes For [name] to improve his shortness of breath with ADLs and continue to gain weight             ITP Comments:Pt is making expected progress toward Pulmonary Rehab goals after completing 18 sessions. Recommend continued exercise, life style modification, education, and utilization of breathing techniques to increase stamina and strength, while also decreasing shortness of breath with exertion.  Dr. Mechele Collin is Medical Director for Pulmonary Rehab at [facility].     Comments: Dr. Mechele Collin is Medical Director for Pulmonary Rehab at [facility].

## 2023-01-19 ENCOUNTER — Encounter (HOSPITAL_COMMUNITY)
Admission: RE | Admit: 2023-01-19 | Discharge: 2023-01-19 | Disposition: A | Discharge status: Home / Self Care | Payer: PPO | Source: Ambulatory Visit | Attending: Internal Medicine | Admitting: Internal Medicine

## 2023-01-19 DIAGNOSIS — J849 Interstitial pulmonary disease, unspecified: Secondary | ICD-10-CM

## 2023-01-19 DIAGNOSIS — G4733 Obstructive sleep apnea (adult) (pediatric): Secondary | ICD-10-CM | POA: Diagnosis not present

## 2023-01-19 NOTE — Progress Notes (Signed)
Daily Session Note  Patient Details  Name: Troy Willis MRN: [id number] Date of Birth: April 26, 1946 Referring Provider:   Doristine Devoid Pulmonary Rehab Walk Test from 11/04/2022 in [facility]  Referring Provider Ramaswamy       Encounter Date: 01/19/2023  Check In:  Session Check In - 01/19/23 1209       Check-In   Supervising physician immediately available to respond to emergencies [facility] MD immediately available    Physician(s) Metta Clines, NP    Location [facility]    Staff Present Elissa Lovett BS, ACSM-CEP, Exercise Physiologist;Kaylee Earlene Plater, MS, ACSM-CEP, Exercise Physiologist;Sevrin Sally Hermine Messick Bel[place], RD, Dutch Gray, RN, BSN    Virtual Visit No    Medication changes reported     No    Fall or balance concerns reported    No    Tobacco Cessation No Change    Warm-up and Cool-down Performed as group-led instruction    Resistance Training Performed Yes    VAD Patient? No    PAD/SET Patient? No      Pain Assessment   Currently in Pain? No/denies    Multiple Pain Sites No             Capillary Blood Glucose: No results found for this or any previous visit (from the past 24 hour(s)).    Social History   Tobacco Use  Smoking Status Never  Smokeless Tobacco Former   Types: Chew   Quit date: 05/16/1978  Tobacco Comments   occasional cigar in past; not regular smoker    Goals Met:  Proper associated with RPD/PD & O2 Sat Independence with exercise equipment Exercise tolerated well No report of concerns or symptoms today Strength training completed today  Goals Unmet:  Not Applicable  Comments: Service time is from 1014 to 1152.    Dr. Mechele Collin is Medical Director for Pulmonary Rehab at [facility].

## 2023-01-24 ENCOUNTER — Encounter (HOSPITAL_COMMUNITY)
Admission: RE | Admit: 2023-01-24 | Discharge: 2023-01-24 | Disposition: A | Discharge status: Home / Self Care | Payer: PPO | Source: Ambulatory Visit | Attending: Internal Medicine

## 2023-01-24 DIAGNOSIS — J849 Interstitial pulmonary disease, unspecified: Secondary | ICD-10-CM | POA: Diagnosis not present

## 2023-01-24 NOTE — Progress Notes (Signed)
Daily Session Note  Patient Details  Name: Troy Willis MRN: [id number] Date of Birth: Oct 12, 1945 Referring Provider:   Doristine Devoid Pulmonary Rehab Walk Test from 11/04/2022 in [facility] V[facility], [facility]  Referring Provider Ramaswamy       Encounter Date: 01/24/2023  Check In:  Session Check In - 01/24/23 1139       Check-In   Supervising physician immediately available to respond to emergencies [facility] MD immediately available    Physician(s) Metta Clines, NP    Location [facility]    Staff Present Elissa Lovett BS, ACSM-CEP, Exercise Physiologist;Kaylee Earlene Plater, MS, ACSM-CEP, Exercise Physiologist;Marthena Whitmyer Hermine Messick Bel[place], RD, Dutch Gray, RN, BSN    Virtual Visit No    Medication changes reported     No    Fall or balance concerns reported    No    Tobacco Cessation No Change    Warm-up and Cool-down Performed as group-led instruction    Resistance Training Performed Yes    VAD Patient? No    PAD/SET Patient? No      Pain Assessment   Currently in Pain? No/denies             Capillary Blood Glucose: No results found for this or any previous visit (from the past 24 hour(s)).    Social History   Tobacco Use  Smoking Status Never  Smokeless Tobacco Former   Types: Chew   Quit date: 05/16/1978  Tobacco Comments   occasional cigar in past; not regular smoker    Goals Met:  Proper associated with RPD/PD & O2 Sat Independence with exercise equipment Exercise tolerated well No report of concerns or symptoms today Strength training completed today  Goals Unmet:  Not Applicable  Comments: Service time is from 1025 to 1148.    Dr. Mechele Collin is Medical Director for Pulmonary Rehab at [facility].

## 2023-01-26 ENCOUNTER — Encounter (HOSPITAL_COMMUNITY)
Admission: RE | Admit: 2023-01-26 | Discharge: 2023-01-26 | Disposition: A | Discharge status: Home / Self Care | Payer: PPO | Source: Ambulatory Visit | Attending: Internal Medicine | Admitting: Internal Medicine

## 2023-01-26 DIAGNOSIS — J849 Interstitial pulmonary disease, unspecified: Secondary | ICD-10-CM | POA: Diagnosis not present

## 2023-01-26 DIAGNOSIS — S61012A Laceration without foreign body of left thumb without damage to nail, initial encounter: Secondary | ICD-10-CM | POA: Diagnosis not present

## 2023-01-30 ENCOUNTER — Encounter: Payer: Self-pay | Admitting: Internal Medicine

## 2023-01-30 ENCOUNTER — Encounter: Payer: Self-pay | Admitting: Cardiology

## 2023-01-31 ENCOUNTER — Encounter (HOSPITAL_COMMUNITY): Payer: PPO

## 2023-01-31 ENCOUNTER — Encounter (HOSPITAL_COMMUNITY)
Admission: RE | Admit: 2023-01-31 | Discharge: 2023-01-31 | Disposition: A | Discharge status: Home / Self Care | Payer: PPO | Source: Ambulatory Visit | Attending: Internal Medicine | Admitting: Internal Medicine

## 2023-01-31 DIAGNOSIS — J849 Interstitial pulmonary disease, unspecified: Secondary | ICD-10-CM | POA: Diagnosis not present

## 2023-02-01 ENCOUNTER — Telehealth (HOSPITAL_COMMUNITY): Payer: Self-pay

## 2023-02-01 NOTE — Telephone Encounter (Signed)
Called [name] to schedule 6 MWT. He agreed with scheduled time.

## 2023-02-02 ENCOUNTER — Encounter (HOSPITAL_COMMUNITY)
Admission: RE | Admit: 2023-02-02 | Discharge: 2023-02-02 | Disposition: A | Discharge status: Home / Self Care | Payer: PPO | Source: Ambulatory Visit | Attending: Internal Medicine | Admitting: Internal Medicine

## 2023-02-02 DIAGNOSIS — J849 Interstitial pulmonary disease, unspecified: Secondary | ICD-10-CM | POA: Diagnosis not present

## 2023-02-03 NOTE — Progress Notes (Signed)
Discharge Progress Report  Patient Details  Name: Troy Willis MRN: [id number] Date of Birth: 03/27/1946 Referring Provider:   Doristine Devoid Pulmonary Rehab Walk Test from 11/04/2022 in [facility]  Referring Provider Ramaswamy        Number of Visits: 57  Reason for Discharge:  Patient reached a stable level of exercise. Patient independent in their exercise. Patient has met program and personal goals.  Smoking History:  Social History   Tobacco Use  Smoking Status Never  Smokeless Tobacco Former   Types: Chew   Quit date: 05/16/1978  Tobacco Comments   occasional cigar in past; not regular smoker    Diagnosis:  ILD (interstitial lung disease) (HCC)  ADL UCSD:  Pulmonary Assessment Scores     Row Name 11/04/22 1159 01/31/23 1527       ADL UCSD   ADL Phase Entry --    SOB Score total 26 20      CAT Score   CAT Score 16 14      mMRC Score   mMRC Score 2 --             Initial Exercise Prescription:  Initial Exercise Prescription - 11/04/22 1200       Date of Initial Exercise RX and Referring Provider   Date 11/04/22    Referring Provider Ramaswamy    Expected Discharge Date 02/02/23      Elliptical   Level 1    Speed 60    Minutes 15    METs 2      Rower   Level 1    Watts 15    Minutes 15      Prescription Details   Frequency (times per week) 2    Duration Progress to 30 minutes of continuous aerobic without signs/symptoms of physical distress      Intensity   THRR 40-80% of Max Heartrate 57-114    Ratings of Perceived Exertion 11-13    Perceived Dyspnea 0-4      Progression   Progression Continue to progress workloads to maintain intensity without signs/symptoms of physical distress.      Resistance Training   Training Prescription Yes    Weight --   red bands   Reps 10-15             Discharge Exercise Prescription (Final Exercise Prescription  Changes):  Exercise Prescription Changes - 01/31/23 0900       Response to Exercise   Blood Pressure (Admit) 112/62    Blood Pressure (Exercise) 132/66    Blood Pressure (Exit) 98/56    Heart Rate (Admit) 68 bpm    Heart Rate (Exercise) 150 bpm    Heart Rate (Exit) 73 bpm    Oxygen Saturation (Admit) 99 %    Oxygen Saturation (Exercise) 92 %    Oxygen Saturation (Exit) 95 %    Rating of Perceived Exertion (Exercise) 15    Perceived Dyspnea (Exercise) 3    Duration Continue with 30 min of aerobic exercise without signs/symptoms of physical distress.    Intensity THRR unchanged      Progression   Progression Continue to progress workloads to maintain intensity without signs/symptoms of physical distress.      Resistance Training   Training Prescription Yes    Weight black bands    Reps 10-15    Time 10 Minutes      Treadmill   MPH 3.3  Grade 3    Minutes 15    METs 4.89      Bike   Level --   airdyne, 48 rpm   Watts 86    Minutes 15             Functional Capacity:  6 Minute Walk     Row Name 11/04/22 1159 02/02/23 1644       6 Minute Walk   Phase -- Discharge    Distance 1825 feet 2170 feet    Distance % Change -- 18.9 %    Distance Feet Change -- 345 ft    Walk Time 6 minutes 6 minutes    # of Rest Breaks 0 0    MPH 3.46 4.11    METS 3.99 4.76    RPE 13 14    Perceived Dyspnea  2 3    VO2 Peak 13.98 16.66    Symptoms No No    Resting HR 63 bpm 64 bpm    Resting BP 110/58 108/56    Resting Oxygen Saturation  100 % 100 %    Exercise Oxygen Saturation  during 6 min walk 91 % 91 %    Max Ex. HR 112 bpm 125 bpm    Max Ex. BP 132/64 146/64    2 Minute Post BP 114/70 126/62      Interval HR   1 Minute HR 68 61    2 Minute HR 108 62  artifact    3 Minute HR 104 61  artifact    4 Minute HR 102 121    5 Minute HR 112 115    6 Minute HR 104 125    2 Minute Post HR 62 62    Interval Heart Rate? Yes Yes      Interval Oxygen   Interval Oxygen?  Yes Yes    Baseline Oxygen Saturation % 100 % 100 %    1 Minute Oxygen Saturation % 97 % 99 %    1 Minute Liters of Oxygen 0 L 0 L    2 Minute Oxygen Saturation % 95 % 90 %  artifact    2 Minute Liters of Oxygen 0 L 0 L    3 Minute Oxygen Saturation % 93 % 96 %    3 Minute Liters of Oxygen 0 L 0 L    4 Minute Oxygen Saturation % 91 % 96 %    4 Minute Liters of Oxygen 0 L 0 L    5 Minute Oxygen Saturation % 91 % 91 %    5 Minute Liters of Oxygen 0 L 0 L    6 Minute Oxygen Saturation % 92 % 94 %    6 Minute Liters of Oxygen 0 L 0 L    2 Minute Post Oxygen Saturation % 98 % 99 %    2 Minute Post Liters of Oxygen 0 L 0 L             Psychological, QOL, Others - Outcomes: PHQ 2/9:    01/31/2023    3:23 PM 11/04/2022   10:39 AM 04/19/2022    4:32 PM 09/20/2021    3:42 PM 04/05/2021    7:49 AM  Depression screen PHQ 2/9  Decreased Interest 0 0 0 0 0  Down, Depressed, Hopeless 0 0 0 0 0  PHQ - 2 Score 0 0 0 0 0  Altered sleeping 1 0 0    Tired, decreased energy 0 1 0  Change in appetite 0 0 0    Feeling bad or failure about yourself  0 0 0    Trouble concentrating 1 0 0    Moving slowly or fidgety/restless 0 0 0    Suicidal thoughts 0 0 0    PHQ-9 Score 2 1 0    Difficult doing work/chores Not difficult at all Somewhat difficult Not difficult at all      Quality of Life:   Personal Goals: Goals established at orientation with interventions provided to work toward goal.  Personal Goals and Risk Factors at Admission - 11/04/22 1108       Core Components/Risk Factors/Patient Goals on Admission    Weight Management Weight Maintenance    Improve shortness of breath with ADL's Yes    Intervention Provide education, individualized exercise plan and daily activity instruction to help decrease symptoms of SOB with activities of daily living.    Expected Outcomes Long Term: Be able to perform more ADLs without symptoms or delay the onset of symptoms;Short Term: Improve  cardiorespiratory fitness to achieve a reduction of symptoms when performing ADLs              Personal Goals Discharge:  Goals and Risk Factor Review     Row Name 11/21/22 0846 12/14/22 1507 01/09/23 1534 02/03/23 1538       Core Components/Risk Factors/Patient Goals Review   Personal Goals Review Improve shortness of breath with ADL's Weight Management/Obesity;Improve shortness of breath with ADL's Weight Management/Obesity;Improve shortness of breath with ADL's;Develop more efficient breathing techniques such as purse lipped breathing and diaphragmatic breathing and practicing self-pacing with activity. Weight Management/Obesity;Improve shortness of breath with ADL's    Review [name] has recently started the program. He is able to demonstrate purse lip breathing when he gets short of breath. He is still working on the goal to improve his shortness of breath. [name] is enjoying the program so far. We will continue to monitor his progress. Weight management goal progressing. [name] is working with dietician to find ways to maintain weight. . Goal progressing on improving his shortness of breath with ADLs. Goal is progressing for weight gain. [name] is up ~10 pounds since starting the program. Meals are provided to him at his independent living facility. He is eating more frequently and increasing his caloric intake. Goal progressing for decreasing his shortness of breath with ADLs. [name]'s oxygen has maintained >88% on room air with exertion. He states he is able to more activities with decreased shortness of breath. Goal met on developing more efficient breathing techniques such as purse lipped breathing and diaphragmatic breathing; and practicing self-pacing with activity. [name] can initiate the PLB without staff interference while exercising. [name] also knows how to pace himself when he becomes short of breath. [name] will continue to benefit from PR for nutrition, education, exercise, and lifestyle modification. [name]  completed 23 classes at the time of graduation from PR on 02/02/23. [name]'s core components/risk factors/patient goals review are as follows: [name] met his goal for weight gain during the program. He worked with our dietitian and successfully gained ~10#, getting his BMI to a 22. He is confident that he can keep the weight on post-graduation. [name] met his goal of decreasing his shortness of breath. His shortness of breath scores decreased from 26 to 20, CAT score decreased from a 16 to a 14, though his MMRC stayed that same at a 2. [name] maintained his oxygen saturation of 88% or higher on room air while  exercising. His distance increased by 18.9%, METS, MPH, and VO2Peak all also increased. [name] was eager to exercise and always put forth great effort in class. 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    Expected Outcomes See admission goals See admission goals For [name] to improve his shortness of breath with ADLs and continue to gain weight For [name] to continue exercising, maintaining his weight, and decreasing his shortness of breath post-graduation from PR program.             Exercise Goals and Review:  Exercise Goals     Row Name 11/04/22 1158 11/18/22 0943 12/09/22 1254 01/11/23 1142       Exercise Goals   Increase Physical Activity Yes Yes Yes Yes    Intervention Provide advice, education, support and counseling about physical activity/exercise needs.;Develop an individualized exercise prescription for aerobic and resistive training based on initial evaluation findings, risk stratification, comorbidities and participant's personal goals. Provide advice, education, support and counseling about physical activity/exercise needs.;Develop an individualized exercise prescription for aerobic and resistive training based on initial evaluation findings, risk stratification, comorbidities and participant's personal goals. Provide advice, education, support and  counseling about physical activity/exercise needs.;Develop an individualized exercise prescription for aerobic and resistive training based on initial evaluation findings, risk stratification, comorbidities and participant's personal goals. Provide advice, education, support and counseling about physical activity/exercise needs.;Develop an individualized exercise prescription for aerobic and resistive training based on initial evaluation findings, risk stratification, comorbidities and participant's personal goals.    Expected Outcomes Short Term: Attend rehab on a regular basis to increase amount of physical activity.;Long Term: Exercising regularly at least 3-5 days a week.;Long Term: Add in home exercise to make exercise part of routine and to increase amount of physical activity. Short Term: Attend rehab on a regular basis to increase amount of physical activity.;Long Term: Exercising regularly at least 3-5 days a week.;Long Term: Add in home exercise to make exercise part of routine and to increase amount of physical activity. Short Term: Attend rehab on a regular basis to increase amount of physical activity.;Long Term: Exercising regularly at least 3-5 days a week.;Long Term: Add in home exercise to make exercise part of routine and to increase amount of physical activity. Short Term: Attend rehab on a regular basis to increase amount of physical activity.;Long Term: Exercising regularly at least 3-5 days a week.;Long Term: Add in home exercise to make exercise part of routine and to increase amount of physical activity.    Increase Strength and Stamina Yes Yes Yes Yes    Intervention Provide advice, education, support and counseling about physical activity/exercise needs.;Develop an individualized exercise prescription for aerobic and resistive training based on initial evaluation findings, risk stratification, comorbidities and participant's personal goals. Provide advice, education, support and  counseling about physical activity/exercise needs.;Develop an individualized exercise prescription for aerobic and resistive training based on initial evaluation findings, risk stratification, comorbidities and participant's personal goals. Provide advice, education, support and counseling about physical activity/exercise needs.;Develop an individualized exercise prescription for aerobic and resistive training based on initial evaluation findings, risk stratification, comorbidities and participant's personal goals. Provide advice, education, support and counseling about physical activity/exercise needs.;Develop an individualized exercise prescription for aerobic and resistive training based on initial evaluation findings, risk stratification, comorbidities and participant's personal goals.    Expected Outcomes Short Term: Increase workloads from initial exercise prescription for resistance, speed, and METs.;Short Term: Perform resistance training exercises routinely during rehab  and add in resistance training at home;Long Term: Improve cardiorespiratory fitness, muscular endurance and strength as measured by increased METs and functional capacity ( ) Short Term: Increase workloads from initial exercise prescription for resistance, speed, and METs.;Short Term: Perform resistance training exercises routinely during rehab and add in resistance training at home;Long Term: Improve cardiorespiratory fitness, muscular endurance and strength as measured by increased METs and functional capacity ( ) Short Term: Increase workloads from initial exercise prescription for resistance, speed, and METs.;Short Term: Perform resistance training exercises routinely during rehab and add in resistance training at home;Long Term: Improve cardiorespiratory fitness, muscular endurance and strength as measured by increased METs and functional capacity ( ) Short Term: Increase workloads from initial exercise prescription for  resistance, speed, and METs.;Short Term: Perform resistance training exercises routinely during rehab and add in resistance training at home;Long Term: Improve cardiorespiratory fitness, muscular endurance and strength as measured by increased METs and functional capacity ( )    Able to understand and use rate of perceived exertion (RPE) scale Yes Yes Yes Yes    Intervention Provide education and explanation on how to use RPE scale Provide education and explanation on how to use RPE scale Provide education and explanation on how to use RPE scale Provide education and explanation on how to use RPE scale    Expected Outcomes Short Term: Able to use RPE daily in rehab to express subjective intensity level;Long Term:  Able to use RPE to guide intensity level when exercising independently Short Term: Able to use RPE daily in rehab to express subjective intensity level;Long Term:  Able to use RPE to guide intensity level when exercising independently Short Term: Able to use RPE daily in rehab to express subjective intensity level;Long Term:  Able to use RPE to guide intensity level when exercising independently Short Term: Able to use RPE daily in rehab to express subjective intensity level;Long Term:  Able to use RPE to guide intensity level when exercising independently    Able to understand and use Dyspnea scale Yes Yes Yes Yes    Intervention Provide education and explanation on how to use Dyspnea scale Provide education and explanation on how to use Dyspnea scale Provide education and explanation on how to use Dyspnea scale Provide education and explanation on how to use Dyspnea scale    Expected Outcomes Short Term: Able to use Dyspnea scale daily in rehab to express subjective sense of shortness of breath during exertion;Long Term: Able to use Dyspnea scale to guide intensity level when exercising independently Short Term: Able to use Dyspnea scale daily in rehab to express subjective sense of shortness of  breath during exertion;Long Term: Able to use Dyspnea scale to guide intensity level when exercising independently Short Term: Able to use Dyspnea scale daily in rehab to express subjective sense of shortness of breath during exertion;Long Term: Able to use Dyspnea scale to guide intensity level when exercising independently Short Term: Able to use Dyspnea scale daily in rehab to express subjective sense of shortness of breath during exertion;Long Term: Able to use Dyspnea scale to guide intensity level when exercising independently    Knowledge and understanding of Target Heart Rate Range (THRR) Yes Yes Yes Yes    Intervention Provide education and explanation of THRR including how the numbers were predicted and where they are located for reference Provide education and explanation of THRR including how the numbers were predicted and where they are located for reference Provide education and explanation of THRR including how the numbers were  predicted and where they are located for reference Provide education and explanation of THRR including how the numbers were predicted and where they are located for reference    Expected Outcomes Short Term: Able to state/look up THRR;Long Term: Able to use THRR to govern intensity when exercising independently;Short Term: Able to use daily as guideline for intensity in rehab Short Term: Able to state/look up THRR;Long Term: Able to use THRR to govern intensity when exercising independently;Short Term: Able to use daily as guideline for intensity in rehab Short Term: Able to state/look up THRR;Long Term: Able to use THRR to govern intensity when exercising independently;Short Term: Able to use daily as guideline for intensity in rehab Short Term: Able to state/look up THRR;Long Term: Able to use THRR to govern intensity when exercising independently;Short Term: Able to use daily as guideline for intensity in rehab    Understanding of Exercise Prescription Yes Yes Yes Yes     Intervention Provide education, explanation, and written materials on patient's individual exercise prescription Provide education, explanation, and written materials on patient's individual exercise prescription Provide education, explanation, and written materials on patient's individual exercise prescription Provide education, explanation, and written materials on patient's individual exercise prescription    Expected Outcomes Short Term: Able to explain program exercise prescription;Long Term: Able to explain home exercise prescription to exercise independently Short Term: Able to explain program exercise prescription;Long Term: Able to explain home exercise prescription to exercise independently Short Term: Able to explain program exercise prescription;Long Term: Able to explain home exercise prescription to exercise independently Short Term: Able to explain program exercise prescription;Long Term: Able to explain home exercise prescription to exercise independently             Exercise Goals Re-Evaluation:  Exercise Goals Re-Evaluation     Row Name 11/18/22 0943 12/09/22 1255 01/11/23 1142 02/02/23 1638       Exercise Goal Re-Evaluation   Exercise Goals Review Increase Physical Activity;Able to understand and use Dyspnea scale;Understanding of Exercise Prescription;Increase Strength and Stamina;Knowledge and understanding of Target Heart Rate Range (THRR);Able to understand and use rate of perceived exertion (RPE) scale Increase Physical Activity;Able to understand and use Dyspnea scale;Understanding of Exercise Prescription;Increase Strength and Stamina;Knowledge and understanding of Target Heart Rate Range (THRR);Able to understand and use rate of perceived exertion (RPE) scale Increase Physical Activity;Able to understand and use Dyspnea scale;Understanding of Exercise Prescription;Increase Strength and Stamina;Knowledge and understanding of Target Heart Rate Range (THRR);Able to  understand and use rate of perceived exertion (RPE) scale Increase Physical Activity;Able to understand and use Dyspnea scale;Understanding of Exercise Prescription;Increase Strength and Stamina;Knowledge and understanding of Target Heart Rate Range (THRR);Able to understand and use rate of perceived exertion (RPE) scale    Comments [name] has completed 2 exercise sessions. He exercises for 15 min on the upright elliptical and rower. [name] averages 4.8 METs at 2 speed and 4 incline on the upright elliptical and 50 watts at level 4 on the rower. He performs the warmup and cooldown standing without limitations. It is too soon to note any discernable progressions. Will continue to monitor and progress as able. [name] has completed 8 exercise sessions. He exercises for 15 min on the treadmill and airdyne. [name] averages 4.77 METs at 3.2 mph and 3% incline and 65 watts on the airdyne. He performs the warmup and cooldown standing without limitations. He has progressed from the upright elliptical to the treadmill and rower to airdyne. Both exercise modes are more comfortable for [name] and more  difficult. He enjoys these exercise modes more and tolerates both well. He recently started both exercise modes as it is too soon to note any progressions for the treadmil and airdyne. [name] seems very motivated to exercise and improve his functional capacity. Will continue to monitor and progress as able. [name] has completed 16 exercise sessions. He exercises for 15 min on the treadmill and airdyne. [name] averages 4.77 METs at 3.2 mph and 3% incline and 78 watts/ 47 rpm on the airdyne. He performs the warmup and cooldown standing without limitations. [name] has increased his watts on the airdyne. He tolerates both exercise modes well. He is possibly reaching his functional capacity. [name] also exercises outside of rehab. He is meeting minimum exercise guidelines. I am confident in [name] completing an exercise regimen at home. Will continue to monitor and  progress as able. [name] completed 23 exercise sessions. His peak METs were 4.89 on the treadmill and 48 watts on the airdyne. [name] made consistent progressions throughout the program. His 6 MWT distance improved by 18.9%. [name] understands the RPE scale, Dyspnea scale, THR, and ExRx. He will continue to exercise at home.    Expected Outcomes Through exercise at rehab and home, the patient will decrease shortness of breath with daily activities and feel confident in carrying out an exercise regimen at home. Through exercise at rehab and home, the patient will decrease shortness of breath with daily activities and feel confident in carrying out an exercise regimen at home. Through exercise at rehab and home, the patient will decrease shortness of breath with daily activities and feel confident in carrying out an exercise regimen at home. Through exercise at rehab and home, the patient will decrease shortness of breath with daily activities and feel confident in carrying out an exercise regimen at home.             Nutrition & Weight - Outcomes:  Pre Biometrics - 11/04/22 1216       Pre Biometrics   Grip Strength 40 kg              Nutrition:  Nutrition Therapy & Goals - 01/05/23 1348       Nutrition Therapy   Diet Heart Healthy Diet      Personal Nutrition Goals   Nutrition Goal Patient to limit sodium 2300mg  per day   goal in progress.   Personal Goal #2 Patient to maintain weight or identify strategies for weight gain as needed of 0.5-2.0# while enrolled in pulmonary rehab.   goal in progress.   Comments Goals in progress. [name] lives at [facility] assisted living with his wife. He is provided lunch and dinner daily and does eat breakfast in his room daily. He is up 9.9# (~1.2#/week) since starting with our program; BMI now 22.0. He recently stopped salting his food approximately two weeks ago and has decreased high salt snacks (saltines, chips, etc). [name] will continue to benefit from  participation in pulmonary rehab for nutrition, exercise, and lifestyle changes.      Intervention Plan   Intervention Prescribe, educate and counsel regarding individualized specific dietary modifications aiming towards targeted core components such as weight, hypertension, lipid management, diabetes, heart failure and other comorbidities.;Nutrition handout(s) given to patient.    Expected Outcomes Short Term Goal: Understand basic principles of dietary content, such as calories, fat, sodium, cholesterol and nutrients.;Long Term Goal: Adherence to prescribed nutrition plan.             Nutrition Discharge:  Nutrition Assessments -  01/31/23 0947       Rate Your Plate Scores   Pre Score 51    Post Score 65             Education Questionnaire Score:  Knowledge Questionnaire Score - 01/31/23 1524       Knowledge Questionnaire Score   Pre Score 16/18    Post Score 18/18             [name] graduated from the PR program on 02/02/23, completing 23 sessions. Psychosocial re-evaluation at time of graduation reveals: [name] still has stress due to being the primary caregiver for his wife who has Hodgkin's lymphoma. [name] states he loves his wife, but at times it is hard. He looks to family for support. [name] was taught relaxation techniques in class to reduce stress and states that exercising helps. He states that when his family cares for his wife he [place] He declines any psychosocial needs at the time of graduation.    [name]'s core components/risk factors/patient goals review are as follows: [name] met his goal for weight gain during the program. He worked with our dietitian and successfully gained ~10#, getting his BMI to a 22. He is confident that he can keep the weight on post-graduation. [name] met his goal of decreasing his shortness of breath. His shortness of breath scores decreased from 26 to 20, CAT score decreased from a 16 to a 14, though his  MMRC stayed that same at a 2. [name] maintained his oxygen saturation of 88% or higher on room air while exercising. His distance increased by 18.9%, METS, MPH, and VO2Peak all also increased. [name] was eager to exercise and always put forth great effort in class. 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    Goals reviewed with patient; copy given to patient.

## 2023-02-09 DIAGNOSIS — S61012A Laceration without foreign body of left thumb without damage to nail, initial encounter: Secondary | ICD-10-CM | POA: Diagnosis not present

## 2023-02-13 ENCOUNTER — Ambulatory Visit (INDEPENDENT_AMBULATORY_CARE_PROVIDER_SITE_OTHER): Payer: PPO | Admitting: Internal Medicine

## 2023-02-13 DIAGNOSIS — J849 Interstitial pulmonary disease, unspecified: Secondary | ICD-10-CM | POA: Diagnosis not present

## 2023-02-13 LAB — PULMONARY FUNCTION TEST
DL/VA % pred: 63 %
DL/VA: 2.43 ml/min/mmHg/L
DLCO cor % pred: 42 %
DLCO cor: 11.84 ml/min/mmHg
DLCO unc % pred: 42 %
DLCO unc: 11.84 ml/min/mmHg
FEF 25-75 Pre: 1.44 L/s
FEF2575-%Pred-Pre: 58 %
FEV1-%Pred-Pre: 66 %
FEV1-Pre: 2.31 L
FEV1FVC-%Pred-Pre: 98 %
FEV6-%Pred-Pre: 71 %
FEV6-Pre: 3.25 L
FEV6FVC-%Pred-Pre: 106 %
FVC-%Pred-Pre: 67 %
FVC-Pre: 3.25 L
Pre FEV1/FVC ratio: 71 %
Pre FEV6/FVC Ratio: 100 %

## 2023-02-13 NOTE — Patient Instructions (Signed)
Spirometry/DLCO performed today. 

## 2023-02-13 NOTE — Progress Notes (Signed)
Spirometry/DLCO performed today. 

## 2023-02-17 ENCOUNTER — Other Ambulatory Visit (HOSPITAL_BASED_OUTPATIENT_CLINIC_OR_DEPARTMENT_OTHER): Payer: Self-pay

## 2023-02-17 IMAGING — CT CT ABD-PELV W/ CM
2 of 5 series · 15 of 46 positions shown, 17 images · IV contrast (APPLIED)
Comparison: CT chest 10/21/2019

CLINICAL DATA: Diverticulitis suspected. Left-sided abdominal pain.
Pain goes from the ribcage down to groin area. Nausea and vomiting.
Difficulty urinating.

EXAM:
CT ABDOMEN AND PELVIS WITH CONTRAST
TECHNIQUE: Multidetector CT imaging of the abdomen and pelvis was performed
using the standard protocol following bolus administration of
intravenous contrast.
CONTRAST:  100mL OMNIPAQUE IOHEXOL 300 MG/ML  SOLN

[Series 3: abd/ pelvis 5.0 i30f 2 · axial · 0.94mm/px · z∈[+893,+1338]mm · 12 of 99 slices shown, 14 images]
[im 5/99  soft-tissue]
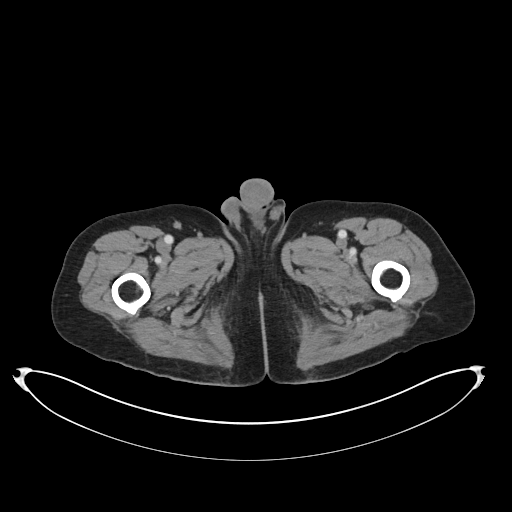
[im 5/99  bone]
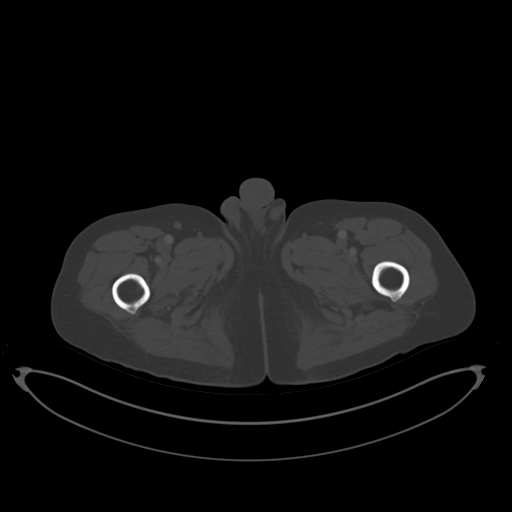
[im 15/99  soft-tissue]
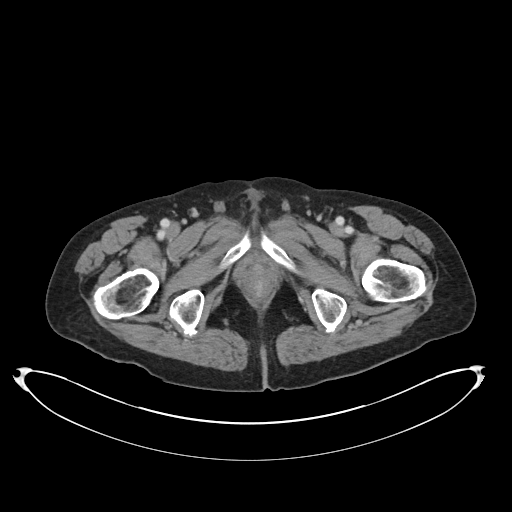
[im 20/99  soft-tissue]
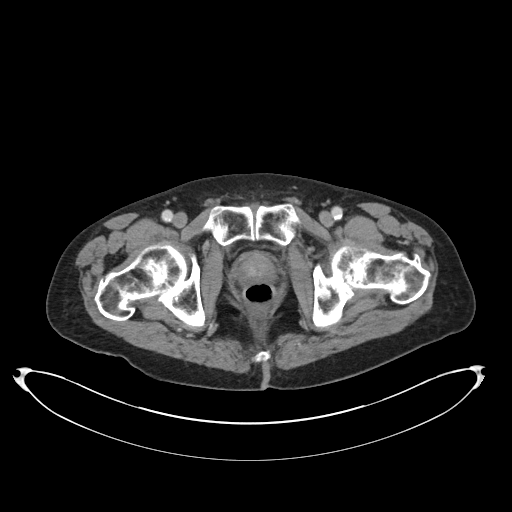
[im 30/99  soft-tissue]
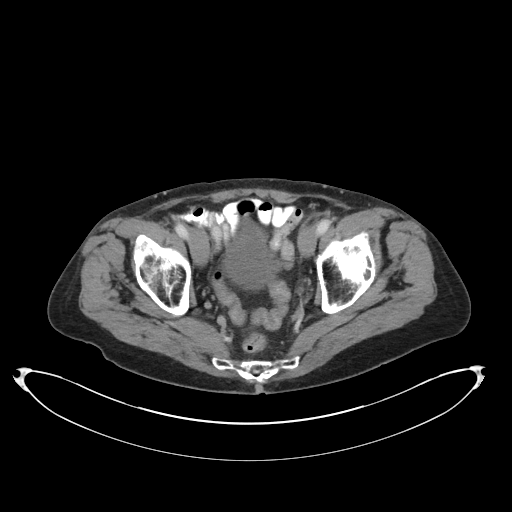
[im 40/99  soft-tissue]
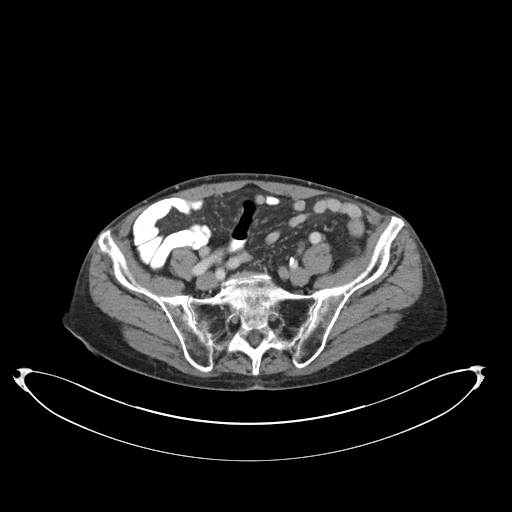
[im 45/99  soft-tissue]
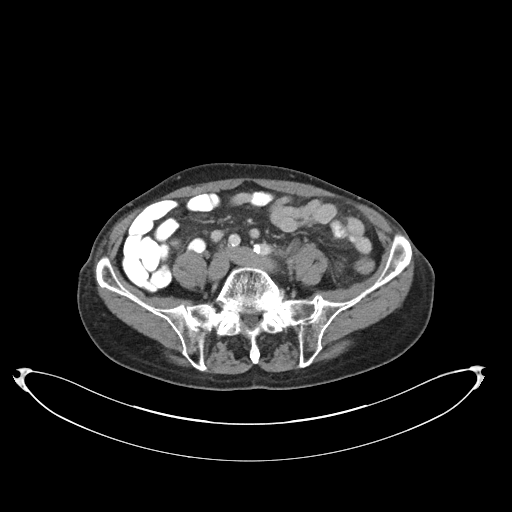
[im 54/99  soft-tissue]
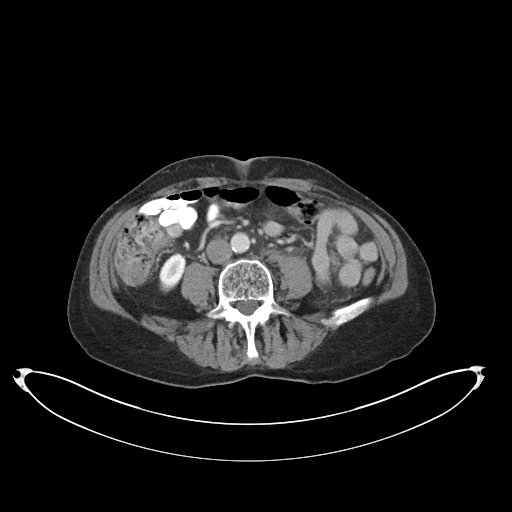
[im 59/99  soft-tissue]
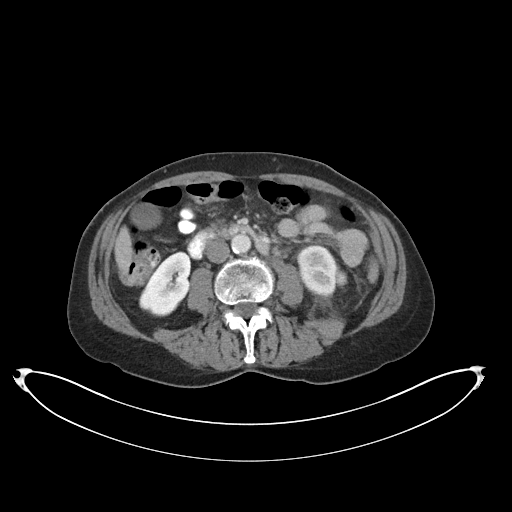
[im 69/99  soft-tissue]
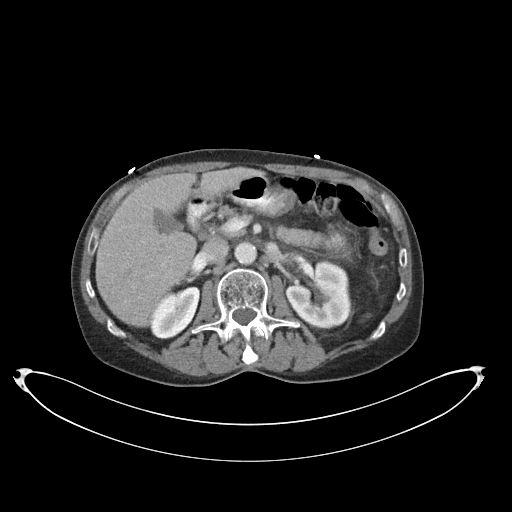
[im 69/99  bone]
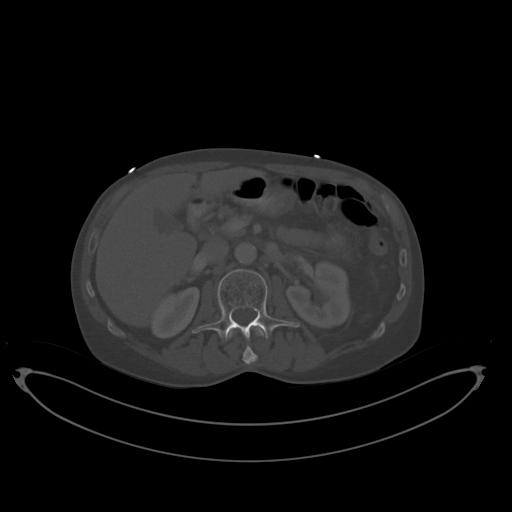
[im 79/99  soft-tissue]
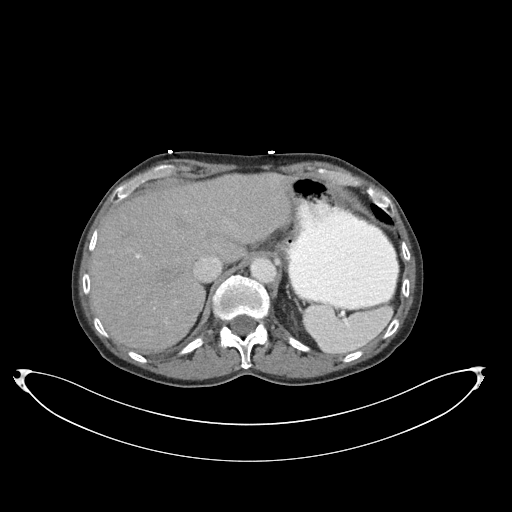
[im 84/99  soft-tissue]
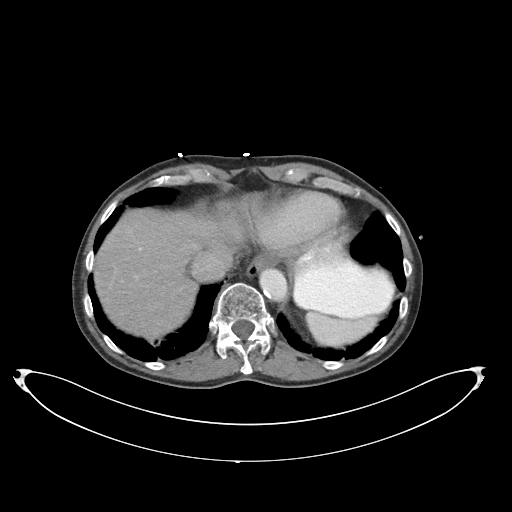
[im 94/99  soft-tissue]
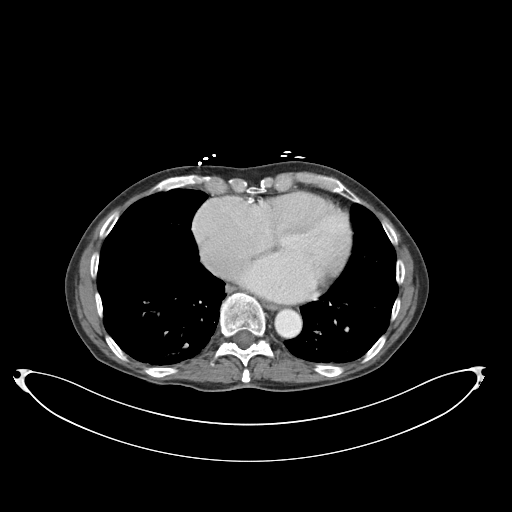

[Series 6: coronal soft tissue · coronal · 0.87mm/px · 3 of 77 slices shown]
[im 26/77  soft-tissue]
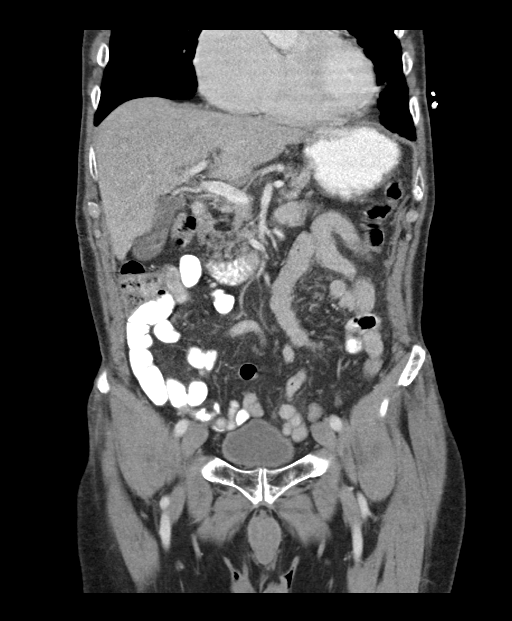
[im 34/77  soft-tissue]
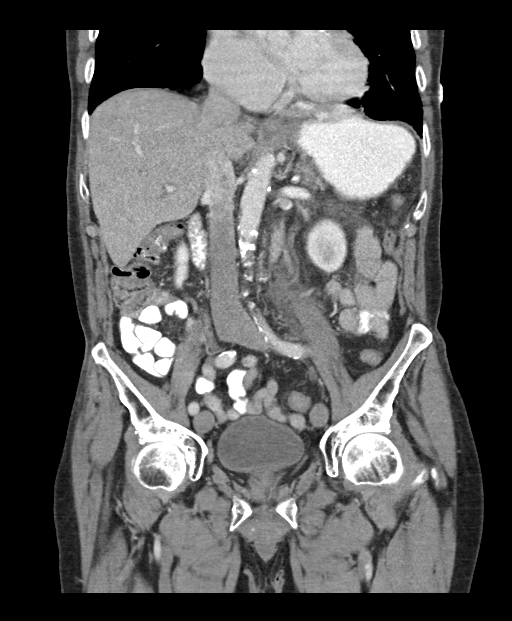
[im 43/77  soft-tissue]
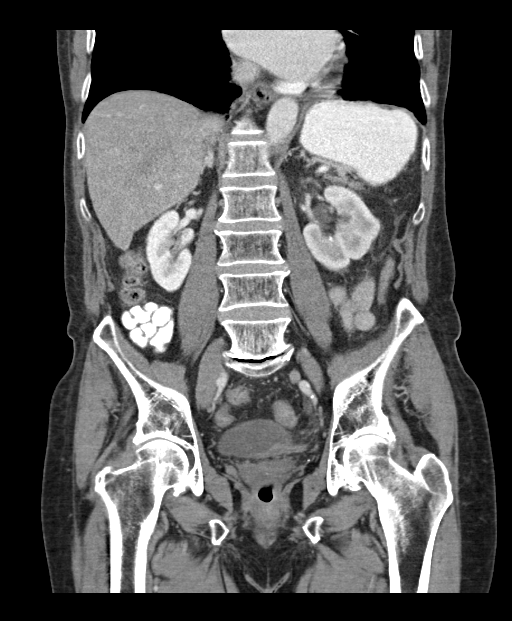

[15 of 46 positions shown; findings below may reference images not displayed]

FINDINGS: Lower chest: Similar-appearing right middle lobe cystic changes that
are partially visualized. Interval development of a nodular-like 5
mm subpleural subsolid density ([DATE]). Linear atelectasis versus
scarring within bilateral lower lobes.

Hepatobiliary: No focal liver abnormality. No gallstones,
gallbladder wall thickening, or pericholecystic fluid. No biliary
dilatation.

Pancreas: No focal lesion. Normal pancreatic contour. No surrounding
inflammatory changes. No main pancreatic ductal dilatation.

Spleen: Normal in size without focal abnormality.

Adrenals/Urinary Tract: No adrenal nodule bilaterally. Delayed left
nephrogram and left perinephric stranding. Couple of punctate
calcified stones within the left kidney are noted. No right
nephrolithiasis. Fullness of the left collecting system with no
frank hydronephrosis. Mild left hydroureter. Question soft tissue
density at the left ureterovesicular junction ([DATE], [DATE]). No right
hydronephrosis. No right hydroureter. No ureterolithiasis
bilaterally. The urinary bladder is unremarkable.

Stomach/Bowel: Stomach is within normal limits. No evidence of bowel
wall thickening or dilatation. The appendix not definitely
identified.

Vascular/Lymphatic: No abdominal aorta or iliac aneurysm. Moderate
calcified and noncalcified atherosclerotic plaque of the aorta and
its branches. No abdominal, pelvic, or inguinal lymphadenopathy.

Reproductive: Prostate is unremarkable.

Other: No intraperitoneal free fluid. No intraperitoneal free gas.
No organized fluid collection.

Musculoskeletal:

No abdominal wall hernia or abnormality.

No suspicious lytic or blastic osseous lesions. No acute displaced
fracture. Interval disc space vacuum phenomenon. Retrolisthesis of
L5 on S1 with no severe osseous central canal stenosis.
IMPRESSION: 1. Delayed left nephrogram with mild fullness of the left collecting
system and question of soft tissue density lesion at the left
ureterovesicular junction. Recommend urologic consultation.
2. Nonobstructive punctate left nephrolithiasis.
3. Interval development of a nodular-like 5 mm subpleural subsolid
density. No follow-up recommended. This recommendation follows the
consensus statement: Guidelines for Management of Incidental
Pulmonary Nodules Detected on CT Images: From the [HOSPITAL]
4.  Aortic Atherosclerosis ([id number]).

## 2023-02-17 MED ORDER — INFLUENZA VAC A&B SURF ANT ADJ 0.5 ML IM SUSY
0.5000 mL | PREFILLED_SYRINGE | Freq: Once | INTRAMUSCULAR | 0 refills | Status: AC
  Filled 2023-02-17: qty 0.5, 1d supply, fill #0

## 2023-02-17 MED ORDER — COVID-19 MRNA VAC-TRIS(PFIZER) 30 MCG/0.3ML IM SUSY
0.3000 mL | PREFILLED_SYRINGE | Freq: Once | INTRAMUSCULAR | 0 refills | Status: AC
  Filled 2023-02-17: qty 0.3, 1d supply, fill #0

## 2023-02-23 ENCOUNTER — Ambulatory Visit: Payer: PPO | Admitting: Internal Medicine

## 2023-02-23 ENCOUNTER — Encounter: Payer: Self-pay | Admitting: Internal Medicine

## 2023-02-23 VITALS — BP 127/75 | HR 70 | Ht 75.0 in | Wt 168.6 lb

## 2023-02-23 DIAGNOSIS — Z836 Family history of other diseases of the respiratory system: Secondary | ICD-10-CM

## 2023-02-23 DIAGNOSIS — J84112 Idiopathic pulmonary fibrosis: Secondary | ICD-10-CM | POA: Diagnosis not present

## 2023-02-23 DIAGNOSIS — J849 Interstitial pulmonary disease, unspecified: Secondary | ICD-10-CM

## 2023-02-23 NOTE — Patient Instructions (Addendum)
ICD-10-CM   1. ILD (interstitial lung disease) (HCC)  J84.9     2. 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 Family history of pulmonary fibrosis  Z83.6         There is mild interstitial lung disease and over time 2017 -> 2020 -> 2024 it appears progressive botn on CT May 2024 and PFT Sept 2024   I think the diagnosis here is idiopathic pulmonary fibrosis based on age greater than 46, Caucasian, probable UIP on CT scan, family history of pulmonary fibrosis  Glad pulmonary rehabilitation has helped you  Glad you met with genetics counselor  Glad you are up-to-date with flu shot  Glad you are considering antifibrotic's     Plan  - Glad you are considering antifibrotic's  = Do not recommend nintedanib/Ofev because of previous history of heart attack and also on anticoagulation [this is reserved a second line]   = Recommend pirfenidone/Esbriet low-dose protocol but appreciate you wanting to do your own web search and think about it and get back to [place] - review GI workup at next visit - get rid of down jacket, mattress-or any material that contains bird feather at home  Follow-up -Awaited decision on low-dose pirfenidone protocol in 1 week  -Please to call us and let the triage desk know - After spirometry and DLCO in 3 months; 30-minute   -Symptoms: Simple exercise hypoxemia test at follow-up

## 2023-02-23 NOTE — Progress Notes (Signed)
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 2. Cardiomegaly with biatrial dilatation. 3. Aortic atherosclerosis, in addition to left main and three-vessel coronary artery disease. Assessment for potential risk factor modification, dietary therapy or pharmacologic therapy may be warranted, if clinically indicated. 4. Emphysema.   Aortic  Atherosclerosis (ICD10-I70.0) and Emphysema (ICD10-J43.9).     Electronically Signed   By: Trudie Reed M.D.   On: 09/25/2022 12:10       OV 02/23/2023  Subjective:  Patient ID: Troy Willis, male , DOB: 1946-03-06 , age 77 y.o. , MRN: [id number] , ADDRESS: [address] [place] [address] PCP Copland, Gwenlyn Found, MD Patient Care Team: Copland, Gwenlyn Found, MD as PCP - General (Family Medicine) Quintella Reichert, MD as PCP - Cardiology (Cardiology) Mateo Flow, MD as Consulting Physician (Ophthalmology) Marinus Maw, MD as Consulting Physician (Cardiology) Cherlyn Roberts, MD as Consulting Physician (Dermatology) Iva Boop, MD as Consulting Physician (Gastroenterology) Quintella Reichert, MD as Consulting Physician (Cardiology) Lupita Leash, MD (Inactive) as Consulting Physician (Pulmonary Disease)  This Provider for this visit: Treatment Team:  Attending Provider: Kalman Shan, MD    02/23/2023 -   Chief Complaint  Patient presents with   Follow-up    Pt denies any concerns     HPI Troy Willis 77 y.o. -here for pulmonary fibrosis follow-up.  Since last visit he tells me that his wife's health is better.  His caregiver burden has improved.  He is doing pulmonary rehabilitation and this is improved his symptoms.  However he had lung function test today.  The FVC is down to 4% and the DLCO is down by greater than 20%.  On average is a 14% decline but his symptoms and exercise hypoxemia test do not fit in with this.  Nevertheless this seems to be definite decline in numbers over time.  I did show him this.  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  He has already seen the genetics counselor.  Details not known.  Indicated to him that antifibrotic's indicated.  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  Went over pirfenidone in detail    Esbriet (Pirfenidone) would be the anti-fibrotic of choice -  Instructions   - Slowly increase the dose per protocol  -Always take it with food  -Any nausea you can try ginger capsules  -Give at least between 5 and 6 hours between dosing  - -Definitely apply sunscreen when you go out with this medication  - You will need monthly liver tests for 6 months and thereafter every 3 months   He is not ready to decide on pirfenidone.  But he will let us know in 1 week.  Offered for him to meet with the pharmacy team but he said he will do some research on his own and also he took down notes.    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 2. Cardiomegaly with biatrial dilatation. 3. Aortic atherosclerosis, in addition to left main and three-vessel coronary artery disease. Assessment for potential risk factor modification, dietary therapy or pharmacologic therapy may be warranted, if clinically indicated. 4. Emphysema.   Aortic  Atherosclerosis (ICD10-I70.0) and Emphysema (ICD10-J43.9).     Electronically Signed   By: Trudie Reed M.D.   On: 09/25/2022 12:10       OV 02/23/2023  Subjective:  Patient ID: Troy Willis, male , DOB: 1946-03-06 , age 77 y.o. , MRN: [id number] , ADDRESS: [address] [place] [address] PCP Copland, Gwenlyn Found, MD Patient Care Team: Copland, Gwenlyn Found, MD as PCP - General (Family Medicine) Quintella Reichert, MD as PCP - Cardiology (Cardiology) Mateo Flow, MD as Consulting Physician (Ophthalmology) Marinus Maw, MD as Consulting Physician (Cardiology) Cherlyn Roberts, MD as Consulting Physician (Dermatology) Iva Boop, MD as Consulting Physician (Gastroenterology) Quintella Reichert, MD as Consulting Physician (Cardiology) Lupita Leash, MD (Inactive) as Consulting Physician (Pulmonary Disease)  This Provider for this visit: Treatment Team:  Attending Provider: Kalman Shan, MD    02/23/2023 -   Chief Complaint  Patient presents with   Follow-up    Pt denies any concerns     HPI Troy Willis 77 y.o. -here for pulmonary fibrosis follow-up.  Since last visit he tells me that his wife's health is better.  His caregiver burden has improved.  He is doing pulmonary rehabilitation and this is improved his symptoms.  However he had lung function test today.  The FVC is down to 4% and the DLCO is down by greater than 20%.  On average is a 14% decline but his symptoms and exercise hypoxemia test do not fit in with this.  Nevertheless this seems to be definite decline in numbers over time.  I did show him this.  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  He has already seen the genetics counselor.  Details not known.  Indicated to him that antifibrotic's indicated.  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  Went over pirfenidone in detail    Esbriet (Pirfenidone) would be the anti-fibrotic of choice -  Instructions   - Slowly increase the dose per protocol  -Always take it with food  -Any nausea you can try ginger capsules  -Give at least between 5 and 6 hours between dosing  - -Definitely apply sunscreen when you go out with this medication  - You will need monthly liver tests for 6 months and thereafter every 3 months   He is not ready to decide on pirfenidone.  But he will let us know in 1 week.  Offered for him to meet with the pharmacy team but he said he will do some research on his own and also he took down notes.    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 2. Cardiomegaly with biatrial dilatation. 3. Aortic atherosclerosis, in addition to left main and three-vessel coronary artery disease. Assessment for potential risk factor modification, dietary therapy or pharmacologic therapy may be warranted, if clinically indicated. 4. Emphysema.   Aortic  Atherosclerosis (ICD10-I70.0) and Emphysema (ICD10-J43.9).     Electronically Signed   By: Trudie Reed M.D.   On: 09/25/2022 12:10       OV 02/23/2023  Subjective:  Patient ID: Troy Willis, male , DOB: 1946-03-06 , age 77 y.o. , MRN: [id number] , ADDRESS: [address] [place] [address] PCP Copland, Gwenlyn Found, MD Patient Care Team: Copland, Gwenlyn Found, MD as PCP - General (Family Medicine) Quintella Reichert, MD as PCP - Cardiology (Cardiology) Mateo Flow, MD as Consulting Physician (Ophthalmology) Marinus Maw, MD as Consulting Physician (Cardiology) Cherlyn Roberts, MD as Consulting Physician (Dermatology) Iva Boop, MD as Consulting Physician (Gastroenterology) Quintella Reichert, MD as Consulting Physician (Cardiology) Lupita Leash, MD (Inactive) as Consulting Physician (Pulmonary Disease)  This Provider for this visit: Treatment Team:  Attending Provider: Kalman Shan, MD    02/23/2023 -   Chief Complaint  Patient presents with   Follow-up    Pt denies any concerns     HPI Troy Willis 77 y.o. -here for pulmonary fibrosis follow-up.  Since last visit he tells me that his wife's health is better.  His caregiver burden has improved.  He is doing pulmonary rehabilitation and this is improved his symptoms.  However he had lung function test today.  The FVC is down to 4% and the DLCO is down by greater than 20%.  On average is a 14% decline but his symptoms and exercise hypoxemia test do not fit in with this.  Nevertheless this seems to be definite decline in numbers over time.  I did show him this.  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  He has already seen the genetics counselor.  Details not known.  Indicated to him that antifibrotic's indicated.  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  Went over pirfenidone in detail    Esbriet (Pirfenidone) would be the anti-fibrotic of choice -  Instructions   - Slowly increase the dose per protocol  -Always take it with food  -Any nausea you can try ginger capsules  -Give at least between 5 and 6 hours between dosing  - -Definitely apply sunscreen when you go out with this medication  - You will need monthly liver tests for 6 months and thereafter every 3 months   He is not ready to decide on pirfenidone.  But he will let us know in 1 week.  Offered for him to meet with the pharmacy team but he said he will do some research on his own and also he took down notes.    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 2. Cardiomegaly with biatrial dilatation. 3. Aortic atherosclerosis, in addition to left main and three-vessel coronary artery disease. Assessment for potential risk factor modification, dietary therapy or pharmacologic therapy may be warranted, if clinically indicated. 4. Emphysema.   Aortic  Atherosclerosis (ICD10-I70.0) and Emphysema (ICD10-J43.9).     Electronically Signed   By: Trudie Reed M.D.   On: 09/25/2022 12:10       OV 02/23/2023  Subjective:  Patient ID: Troy Willis, male , DOB: 1946-03-06 , age 77 y.o. , MRN: [id number] , ADDRESS: [address] [place] [address] PCP Copland, Gwenlyn Found, MD Patient Care Team: Copland, Gwenlyn Found, MD as PCP - General (Family Medicine) Quintella Reichert, MD as PCP - Cardiology (Cardiology) Mateo Flow, MD as Consulting Physician (Ophthalmology) Marinus Maw, MD as Consulting Physician (Cardiology) Cherlyn Roberts, MD as Consulting Physician (Dermatology) Iva Boop, MD as Consulting Physician (Gastroenterology) Quintella Reichert, MD as Consulting Physician (Cardiology) Lupita Leash, MD (Inactive) as Consulting Physician (Pulmonary Disease)  This Provider for this visit: Treatment Team:  Attending Provider: Kalman Shan, MD    02/23/2023 -   Chief Complaint  Patient presents with   Follow-up    Pt denies any concerns     HPI Troy Willis 77 y.o. -here for pulmonary fibrosis follow-up.  Since last visit he tells me that his wife's health is better.  His caregiver burden has improved.  He is doing pulmonary rehabilitation and this is improved his symptoms.  However he had lung function test today.  The FVC is down to 4% and the DLCO is down by greater than 20%.  On average is a 14% decline but his symptoms and exercise hypoxemia test do not fit in with this.  Nevertheless this seems to be definite decline in numbers over time.  I did show him this.  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  He has already seen the genetics counselor.  Details not known.  Indicated to him that antifibrotic's indicated.  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  Went over pirfenidone in detail    Esbriet (Pirfenidone) would be the anti-fibrotic of choice -  Instructions   - Slowly increase the dose per protocol  -Always take it with food  -Any nausea you can try ginger capsules  -Give at least between 5 and 6 hours between dosing  - -Definitely apply sunscreen when you go out with this medication  - You will need monthly liver tests for 6 months and thereafter every 3 months   He is not ready to decide on pirfenidone.  But he will let us know in 1 week.  Offered for him to meet with the pharmacy team but he said he will do some research on his own and also he took down notes.    SYMPTOM SCALE - ILD 11/03/2022 02/23/2023   Current weight  S/p rehab  O2 use ra ra  Shortness of Breath 0 -> 5 scale with 5 being worst (score 6 If unable to do)   At rest 1 0.5  Simple tasks - showers, clothes change, eating, shaving 1 0.5  Household (dishes, doing bed, laundry) 1 1  OV 11/03/2022  Subjective:  Patient ID: Troy Willis, male , DOB: 05/19/1945 , age 56 y.o. , MRN: [id number] , ADDRESS: [address] [place] [address] PCP Copland, Gwenlyn Found, MD Patient Care Team: Copland, Gwenlyn Found, MD as PCP - General (Family Medicine) Quintella Reichert, MD as PCP - Cardiology (Cardiology) Mateo Flow, MD as Consulting Physician (Ophthalmology) Marinus Maw, MD as Consulting Physician (Cardiology) Cherlyn Roberts, MD as Consulting Physician (Dermatology) Iva Boop, MD as Consulting Physician (Gastroenterology) Quintella Reichert, MD as Consulting Physician (Cardiology) Lupita Leash, MD as Consulting Physician (Pulmonary Disease)  This Provider for this visit: Treatment Team:  Attending Provider: Josephine Igo, DO    11/03/2022 -   Chief Complaint  Patient presents with   Consult    Consult for ILD     HPI Troy Willis 77 y.o. -he is a retired Art gallery manager.  Is a transfer of care from Dr. Max Fickle who is no longer with the practice.  Review of the records indicate he has been seen for bronchiectasis and also ILD.  It appears with Dr. Kendrick Fries in January 2024 slow progression and evolution of the CT findings was discussed and they took a shared decision making to avoid antifibrotic's.  He tells me he was in the [employer] and at some point got [place] flu.  After that he was having recurrent bronchitis for few years and then got better.  But at some point abnormal CT scan was discovered.  He says that when he gets respiratory infections is a lot more severe.  He normally walks 2 miles on a regular basis but for the last 1 year has been taking care of his wife with Hodgkin's disease and has been sedentary.  If he feels a little bit more short of breath because of that.  He also has episodic shortness of breath because of A-fib.  He does not believe his increase shortness of breath is because of changes in his ILD  pattern on CT scan.  Tomorrow 11/04/2023 starting pulmonary rehabilitation.   [facility] Comprehensive ILD Questionnaire  Symptoms:   Shortness of breath for 10-15 years.  Has difficulty keeping up with others of his age slowly progressive.  But he attributes this to being sedentary in the last 1 year and having atrial fibrillation.  This cough also started 10-15 years ago is episodic is occasional sometimes clear sometimes brown sputum.  Very trace amounts.  He does clear the throat.  He said ENT evaluation in 2017 because of his hoarse voice and it was normal.  He gets intermittent bronchitis.    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  Previously BNP high but no evidence of right heart catheterization - Has sleep apnea uses CPAP - Has history of bladder infection has had kidney stones in 2022. - Has history of pneumonia in 2017 every couple of years - Had rotator cuff repair in 2022 - Has had COVID-vaccine but also has had COVID disease 2 times most recent 13 Sep 2020  ROS:  0 has fatigue on and off - Does have dysphagia occasionally for large pills - Sensitive to cold weather - Has lost 10 pounds over the last few years - Has significant heartburn  FAMILY HISTORY of LUNG DISEASE:  OV 11/03/2022  Subjective:  Patient ID: Troy Willis, male , DOB: 05/19/1945 , age 56 y.o. , MRN: [id number] , ADDRESS: [address] [place] [address] PCP Copland, Gwenlyn Found, MD Patient Care Team: Copland, Gwenlyn Found, MD as PCP - General (Family Medicine) Quintella Reichert, MD as PCP - Cardiology (Cardiology) Mateo Flow, MD as Consulting Physician (Ophthalmology) Marinus Maw, MD as Consulting Physician (Cardiology) Cherlyn Roberts, MD as Consulting Physician (Dermatology) Iva Boop, MD as Consulting Physician (Gastroenterology) Quintella Reichert, MD as Consulting Physician (Cardiology) Lupita Leash, MD as Consulting Physician (Pulmonary Disease)  This Provider for this visit: Treatment Team:  Attending Provider: Josephine Igo, DO    11/03/2022 -   Chief Complaint  Patient presents with   Consult    Consult for ILD     HPI Troy Willis 77 y.o. -he is a retired Art gallery manager.  Is a transfer of care from Dr. Max Fickle who is no longer with the practice.  Review of the records indicate he has been seen for bronchiectasis and also ILD.  It appears with Dr. Kendrick Fries in January 2024 slow progression and evolution of the CT findings was discussed and they took a shared decision making to avoid antifibrotic's.  He tells me he was in the [employer] and at some point got [place] flu.  After that he was having recurrent bronchitis for few years and then got better.  But at some point abnormal CT scan was discovered.  He says that when he gets respiratory infections is a lot more severe.  He normally walks 2 miles on a regular basis but for the last 1 year has been taking care of his wife with Hodgkin's disease and has been sedentary.  If he feels a little bit more short of breath because of that.  He also has episodic shortness of breath because of A-fib.  He does not believe his increase shortness of breath is because of changes in his ILD  pattern on CT scan.  Tomorrow 11/04/2023 starting pulmonary rehabilitation.   [facility] Comprehensive ILD Questionnaire  Symptoms:   Shortness of breath for 10-15 years.  Has difficulty keeping up with others of his age slowly progressive.  But he attributes this to being sedentary in the last 1 year and having atrial fibrillation.  This cough also started 10-15 years ago is episodic is occasional sometimes clear sometimes brown sputum.  Very trace amounts.  He does clear the throat.  He said ENT evaluation in 2017 because of his hoarse voice and it was normal.  He gets intermittent bronchitis.    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  Previously BNP high but no evidence of right heart catheterization - Has sleep apnea uses CPAP - Has history of bladder infection has had kidney stones in 2022. - Has history of pneumonia in 2017 every couple of years - Had rotator cuff repair in 2022 - Has had COVID-vaccine but also has had COVID disease 2 times most recent 13 Sep 2020  ROS:  0 has fatigue on and off - Does have dysphagia occasionally for large pills - Sensitive to cold weather - Has lost 10 pounds over the last few years - Has significant heartburn  FAMILY HISTORY of LUNG DISEASE:  OV 11/03/2022  Subjective:  Patient ID: Troy Willis, male , DOB: 05/19/1945 , age 56 y.o. , MRN: [id number] , ADDRESS: [address] [place] [address] PCP Copland, Gwenlyn Found, MD Patient Care Team: Copland, Gwenlyn Found, MD as PCP - General (Family Medicine) Quintella Reichert, MD as PCP - Cardiology (Cardiology) Mateo Flow, MD as Consulting Physician (Ophthalmology) Marinus Maw, MD as Consulting Physician (Cardiology) Cherlyn Roberts, MD as Consulting Physician (Dermatology) Iva Boop, MD as Consulting Physician (Gastroenterology) Quintella Reichert, MD as Consulting Physician (Cardiology) Lupita Leash, MD as Consulting Physician (Pulmonary Disease)  This Provider for this visit: Treatment Team:  Attending Provider: Josephine Igo, DO    11/03/2022 -   Chief Complaint  Patient presents with   Consult    Consult for ILD     HPI Troy Willis 77 y.o. -he is a retired Art gallery manager.  Is a transfer of care from Dr. Max Fickle who is no longer with the practice.  Review of the records indicate he has been seen for bronchiectasis and also ILD.  It appears with Dr. Kendrick Fries in January 2024 slow progression and evolution of the CT findings was discussed and they took a shared decision making to avoid antifibrotic's.  He tells me he was in the [employer] and at some point got [place] flu.  After that he was having recurrent bronchitis for few years and then got better.  But at some point abnormal CT scan was discovered.  He says that when he gets respiratory infections is a lot more severe.  He normally walks 2 miles on a regular basis but for the last 1 year has been taking care of his wife with Hodgkin's disease and has been sedentary.  If he feels a little bit more short of breath because of that.  He also has episodic shortness of breath because of A-fib.  He does not believe his increase shortness of breath is because of changes in his ILD  pattern on CT scan.  Tomorrow 11/04/2023 starting pulmonary rehabilitation.   [facility] Comprehensive ILD Questionnaire  Symptoms:   Shortness of breath for 10-15 years.  Has difficulty keeping up with others of his age slowly progressive.  But he attributes this to being sedentary in the last 1 year and having atrial fibrillation.  This cough also started 10-15 years ago is episodic is occasional sometimes clear sometimes brown sputum.  Very trace amounts.  He does clear the throat.  He said ENT evaluation in 2017 because of his hoarse voice and it was normal.  He gets intermittent bronchitis.    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  Previously BNP high but no evidence of right heart catheterization - Has sleep apnea uses CPAP - Has history of bladder infection has had kidney stones in 2022. - Has history of pneumonia in 2017 every couple of years - Had rotator cuff repair in 2022 - Has had COVID-vaccine but also has had COVID disease 2 times most recent 13 Sep 2020  ROS:  0 has fatigue on and off - Does have dysphagia occasionally for large pills - Sensitive to cold weather - Has lost 10 pounds over the last few years - Has significant heartburn  FAMILY HISTORY of LUNG DISEASE:  as probable usual interstitial pneumonia (UIP) per current ATS guidelines, demonstrating only minimal progression compared to the prior study. 2. Cardiomegaly with biatrial dilatation. 3. Aortic atherosclerosis, in addition to left main and three-vessel coronary artery disease. Assessment for potential risk factor modification, dietary therapy or pharmacologic therapy may be warranted, if clinically indicated. 4. Emphysema.   Aortic  Atherosclerosis (ICD10-I70.0) and Emphysema (ICD10-J43.9).     Electronically Signed   By: Trudie Reed M.D.   On: 09/25/2022 12:10       OV 02/23/2023  Subjective:  Patient ID: Troy Willis, male , DOB: 1946-03-06 , age 77 y.o. , MRN: [id number] , ADDRESS: [address] [place] [address] PCP Copland, Gwenlyn Found, MD Patient Care Team: Copland, Gwenlyn Found, MD as PCP - General (Family Medicine) Quintella Reichert, MD as PCP - Cardiology (Cardiology) Mateo Flow, MD as Consulting Physician (Ophthalmology) Marinus Maw, MD as Consulting Physician (Cardiology) Cherlyn Roberts, MD as Consulting Physician (Dermatology) Iva Boop, MD as Consulting Physician (Gastroenterology) Quintella Reichert, MD as Consulting Physician (Cardiology) Lupita Leash, MD (Inactive) as Consulting Physician (Pulmonary Disease)  This Provider for this visit: Treatment Team:  Attending Provider: Kalman Shan, MD    02/23/2023 -   Chief Complaint  Patient presents with   Follow-up    Pt denies any concerns     HPI Troy Willis 77 y.o. -here for pulmonary fibrosis follow-up.  Since last visit he tells me that his wife's health is better.  His caregiver burden has improved.  He is doing pulmonary rehabilitation and this is improved his symptoms.  However he had lung function test today.  The FVC is down to 4% and the DLCO is down by greater than 20%.  On average is a 14% decline but his symptoms and exercise hypoxemia test do not fit in with this.  Nevertheless this seems to be definite decline in numbers over time.  I did show him this.  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  He has already seen the genetics counselor.  Details not known.  Indicated to him that antifibrotic's indicated.  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  Went over pirfenidone in detail    Esbriet (Pirfenidone) would be the anti-fibrotic of choice -  Instructions   - Slowly increase the dose per protocol  -Always take it with food  -Any nausea you can try ginger capsules  -Give at least between 5 and 6 hours between dosing  - -Definitely apply sunscreen when you go out with this medication  - You will need monthly liver tests for 6 months and thereafter every 3 months   He is not ready to decide on pirfenidone.  But he will let us know in 1 week.  Offered for him to meet with the pharmacy team but he said he will do some research on his own and also he took down notes.    SYMPTOM SCALE - ILD 11/03/2022 02/23/2023   Current weight  S/p rehab  O2 use ra ra  Shortness of Breath 0 -> 5 scale with 5 being worst (score 6 If unable to do)   At rest 1 0.5  Simple tasks - showers, clothes change, eating, shaving 1 0.5  Household (dishes, doing bed, laundry) 1 1

## 2023-03-02 ENCOUNTER — Other Ambulatory Visit: Payer: Self-pay

## 2023-03-02 ENCOUNTER — Encounter: Payer: Self-pay | Admitting: Family Medicine

## 2023-03-02 ENCOUNTER — Encounter: Payer: Self-pay | Admitting: Family

## 2023-03-02 ENCOUNTER — Other Ambulatory Visit: Payer: Self-pay | Admitting: Cardiology

## 2023-03-02 ENCOUNTER — Other Ambulatory Visit (HOSPITAL_BASED_OUTPATIENT_CLINIC_OR_DEPARTMENT_OTHER): Payer: Self-pay

## 2023-03-02 DIAGNOSIS — I4821 Permanent atrial fibrillation: Secondary | ICD-10-CM

## 2023-03-02 DIAGNOSIS — N1330 Unspecified hydronephrosis: Secondary | ICD-10-CM

## 2023-03-02 MED ORDER — RIVAROXABAN 20 MG PO TABS
20.0000 mg | ORAL_TABLET | Freq: Every day | ORAL | 1 refills | Status: DC
Start: 2023-03-02 — End: 2023-09-01
  Filled 2023-03-02: qty 90, 90d supply, fill #0
  Filled 2023-06-08: qty 90, 90d supply, fill #1

## 2023-03-02 NOTE — Telephone Encounter (Signed)
Prescription refill request for Xarelto received.  Indication: afib  Last office visit: Mayford Knife 07/05/2022 Weight:76.5 kg  Age:77 yo  Scr: 0.94, 11/03/2022 CrCl: 70 ml/min   Refill sent.

## 2023-03-03 ENCOUNTER — Telehealth: Payer: Self-pay | Admitting: Internal Medicine

## 2023-03-03 DIAGNOSIS — J84112 Idiopathic pulmonary fibrosis: Secondary | ICD-10-CM

## 2023-03-03 DIAGNOSIS — Z5181 Encounter for therapeutic drug level monitoring: Secondary | ICD-10-CM

## 2023-03-03 DIAGNOSIS — J849 Interstitial pulmonary disease, unspecified: Secondary | ICD-10-CM

## 2023-03-03 NOTE — Telephone Encounter (Signed)
Received a fax regarding Prior Authorization from [facility] ADVANTAGE/RX ADVANCE for ESBRIET. Authorization has been DENIED because patient must try/fail generic pirfenidone tablets/capsules.  Submitted a Prior Authorization request to [facility] ADVANTAGE/RX ADVANCE for PIRFENIDONE via CoverMyMeds. Will update once we receive a response.  Key: [id number]

## 2023-03-03 NOTE — Telephone Encounter (Signed)
Returned call to patient and advised that we will need to run prior authorization for Esbriet and will notify patient once approved  Submitted a Prior Authorization request to [facility] ADVANTAGE/RX ADVANCE for ESBRIET via CoverMyMeds. Will update once we receive a response.  Key: [id number]  In anticipation of starting, patient enrolled into [employer] grant for pulmonary fibrosis. Fund has max of $5500. Award Period: 09/04/2022 - 03/02/2024 ID: [id number] BIN: [id number] PCN: [id number] Group: [id number] For pharmacy inquiries, contact PDMI at [phone]. For patient inquiries, contact PAF at [phone].  Chesley Mires, PharmD, MPH, BCPS, CPP Clinical Pharmacist (Rheumatology and Pulmonology)

## 2023-03-03 NOTE — Telephone Encounter (Signed)
Patient would like new RX for Esbriet. Would like to discuss. Patient phone number is [phone].

## 2023-03-07 ENCOUNTER — Other Ambulatory Visit (HOSPITAL_BASED_OUTPATIENT_CLINIC_OR_DEPARTMENT_OTHER): Payer: Self-pay

## 2023-03-07 DIAGNOSIS — S61012A Laceration without foreign body of left thumb without damage to nail, initial encounter: Secondary | ICD-10-CM | POA: Diagnosis not present

## 2023-03-07 MED ORDER — DOXYCYCLINE HYCLATE 100 MG PO CAPS
100.0000 mg | ORAL_CAPSULE | Freq: Two times a day (BID) | ORAL | 0 refills | Status: DC
  Filled 2023-03-07: qty 20, 10d supply, fill #0

## 2023-03-08 ENCOUNTER — Encounter: Payer: Self-pay | Admitting: Internal Medicine

## 2023-03-08 NOTE — Telephone Encounter (Signed)
Per message from [facility] on 03/03/2023. Murrell Redden, RPH-CPP    03/03/23  2:19 PM Note Received a fax regarding Prior Authorization from [facility] ADVANTAGE/RX ADVANCE for ESBRIET. Authorization has been DENIED because patient must try/fail generic pirfenidone tablets/capsules.   Submitted a Prior Authorization request to [facility] ADVANTAGE/RX ADVANCE for PIRFENIDONE via CoverMyMeds. Will update once we receive a response.   Key: [id number]

## 2023-03-08 NOTE — Telephone Encounter (Signed)
Nintendanib (generic) = ofev (trade name ) = same.   Pirfenidone (generic) = esbriet (trade name)  = same. I prefer this and seems the generic was approved. So all good

## 2023-03-09 ENCOUNTER — Other Ambulatory Visit (HOSPITAL_COMMUNITY): Payer: Self-pay

## 2023-03-09 NOTE — Telephone Encounter (Signed)
Received notification from [facility] ADVANTAGE/RX ADVANCE regarding a prior authorization for PIRFENIDONE. Authorization has been APPROVED from 03/03/23 to 03/02/24.  Per test claim, copay for 30 days supply is $2084.72  Patient can fill through [facility]: [phone]   Authorization # [id number]  Chesley Mires, PharmD, MPH, BCPS, CPP Clinical Pharmacist (Rheumatology and Pulmonology)

## 2023-03-10 NOTE — Telephone Encounter (Signed)
Spoke with patient - will provide him sample on Monday to defer on placing $2000 cost on grant with first fill in case of tolerability. He will meet at pulm clinic @1pm   Chesley Mires, PharmD, MPH, BCPS, CPP Clinical Pharmacist (Rheumatology and Pulmonology)

## 2023-03-13 MED ORDER — PIRFENIDONE 267 MG PO TABS
ORAL_TABLET | ORAL | 0 refills | Status: DC
Start: 2023-03-13 — End: 2023-04-19

## 2023-03-13 NOTE — Telephone Encounter (Signed)
Medication Samples have been provided to the patient.  Drug name: Esbriet 267mg   tabs Qty: 270 tabs LOT: [id number] Exp.Date: 07/2024  Dosing instructions: .Take 1 tab three times daily for 7 days, then 2 tabs three times daily thereafter  The patient has been instructed regarding the correct time, dose, and frequency of taking this medication, including desired effects and most common side effects.   Patient counseled on purpose, proper use, and potential adverse effects including nausea, vomiting, abdominal pain, GERD, weight loss, arthralgia, dizziness, and suns sensitivity/rash.  Stressed the importance of routine lab monitoring. Will monitor LFT's every month for the first 6 months of treatment then every 3 months. Will monitor CBC every 3 months.  Chesley Mires, PharmD, MPH, BCPS, CPP Clinical Pharmacist (Rheumatology and Pulmonology)

## 2023-03-23 DIAGNOSIS — S61012A Laceration without foreign body of left thumb without damage to nail, initial encounter: Secondary | ICD-10-CM | POA: Diagnosis not present

## 2023-03-31 ENCOUNTER — Inpatient Hospital Stay (HOSPITAL_BASED_OUTPATIENT_CLINIC_OR_DEPARTMENT_OTHER): Payer: PPO | Admitting: Hematology & Oncology

## 2023-03-31 ENCOUNTER — Encounter: Payer: Self-pay | Admitting: Hematology & Oncology

## 2023-03-31 ENCOUNTER — Inpatient Hospital Stay: Payer: PPO | Attending: Hematology & Oncology

## 2023-03-31 VITALS — BP 104/47 | HR 70 | Temp 98.3°F | Resp 20 | Ht 75.0 in | Wt 168.8 lb

## 2023-03-31 DIAGNOSIS — N1330 Unspecified hydronephrosis: Secondary | ICD-10-CM

## 2023-03-31 DIAGNOSIS — J8410 Pulmonary fibrosis, unspecified: Secondary | ICD-10-CM | POA: Diagnosis not present

## 2023-03-31 DIAGNOSIS — D539 Nutritional anemia, unspecified: Secondary | ICD-10-CM | POA: Insufficient documentation

## 2023-03-31 DIAGNOSIS — Z79899 Other long term (current) drug therapy: Secondary | ICD-10-CM | POA: Diagnosis not present

## 2023-03-31 DIAGNOSIS — D530 Protein deficiency anemia: Secondary | ICD-10-CM

## 2023-03-31 DIAGNOSIS — Z860101 Personal history of adenomatous and serrated colon polyps: Secondary | ICD-10-CM

## 2023-03-31 DIAGNOSIS — Z7901 Long term (current) use of anticoagulants: Secondary | ICD-10-CM | POA: Insufficient documentation

## 2023-03-31 DIAGNOSIS — D518 Other vitamin B12 deficiency anemias: Secondary | ICD-10-CM

## 2023-03-31 LAB — CBC WITH DIFFERENTIAL (CANCER CENTER ONLY)
Abs Immature Granulocytes: 0.01 10*3/uL (ref 0.00–0.07)
Basophils Absolute: 0 10*3/uL (ref 0.0–0.1)
Basophils Relative: 1 %
Eosinophils Absolute: 0.1 10*3/uL (ref 0.0–0.5)
Eosinophils Relative: 1 %
HCT: 32.2 % — ABNORMAL LOW (ref 39.0–52.0)
Hemoglobin: 10.8 g/dL — ABNORMAL LOW (ref 13.0–17.0)
Immature Granulocytes: 0 %
Lymphocytes Relative: 30 %
Lymphs Abs: 1.3 10*3/uL (ref 0.7–4.0)
MCH: 34.8 pg — ABNORMAL HIGH (ref 26.0–34.0)
MCHC: 33.5 g/dL (ref 30.0–36.0)
MCV: 103.9 fL — ABNORMAL HIGH (ref 80.0–100.0)
Monocytes Absolute: 0.4 10*3/uL (ref 0.1–1.0)
Monocytes Relative: 9 %
Neutro Abs: 2.5 10*3/uL (ref 1.7–7.7)
Neutrophils Relative %: 59 %
Platelet Count: 145 10*3/uL — ABNORMAL LOW (ref 150–400)
RBC: 3.1 MIL/uL — ABNORMAL LOW (ref 4.22–5.81)
RDW: 13.4 % (ref 11.5–15.5)
WBC Count: 4.3 10*3/uL (ref 4.0–10.5)
nRBC: 0 % (ref 0.0–0.2)

## 2023-03-31 LAB — VITAMIN B12: Vitamin B-12: 1289 pg/mL — ABNORMAL HIGH (ref 180–914)

## 2023-03-31 LAB — CMP (CANCER CENTER ONLY)
ALT: 14 U/L (ref 0–44)
AST: 29 U/L (ref 15–41)
Albumin: 4.4 g/dL (ref 3.5–5.0)
Alkaline Phosphatase: 51 U/L (ref 38–126)
Anion gap: 5 (ref 5–15)
BUN: 20 mg/dL (ref 8–23)
CO2: 29 mmol/L (ref 22–32)
Calcium: 9.8 mg/dL (ref 8.9–10.3)
Chloride: 102 mmol/L (ref 98–111)
Creatinine: 1.08 mg/dL (ref 0.61–1.24)
GFR, Estimated: >60 mL/min (ref >60)
Glucose, Bld: 103 mg/dL — ABNORMAL HIGH (ref 70–99)
Potassium: 4.3 mmol/L (ref 3.5–5.1)
Sodium: 136 mmol/L (ref 135–145)
Total Bilirubin: 0.4 mg/dL (ref <1.2)
Total Protein: 7.5 g/dL (ref 6.5–8.1)

## 2023-03-31 LAB — SAVE SMEAR(SSMR), FOR PROVIDER SLIDE REVIEW

## 2023-03-31 LAB — LACTATE DEHYDROGENASE: LDH: 182 U/L (ref 98–192)

## 2023-03-31 NOTE — Progress Notes (Signed)
Hematology and Oncology Follow Up Visit  Troy Willis [id number] 08-11-1945 77 y.o. 03/31/2023   Principle Diagnosis:  Macrocytic anemia    Current Therapy:        B12 2500 mcg PO daily   Interim History:  Troy Willis is here today for follow-up.  T he is doing okay.  He feels all right.  He now is on a new medication for his pulmonary fibrosis.  He has had no issues with bleeding.  I think he is on Xarelto for blood thinner.  He has had no problems with nausea or vomiting.  He has had no change in bowel or bladder habits.  His appetite has been quite good.  He has had no rashes.  He has a little bit of leg swelling.  Overall, I would say that his performance status is probably ECOG 1.      Medications:  Allergies as of 03/31/2023       Reactions   Amiodarone Other (See Comments)   elevated liver enzymes   Atorvastatin Other (See Comments)   Nocturnal leg cramps   Black Pepper-turmeric Other (See Comments)   heartburn   Metoprolol Other (See Comments)   Hypotension   Prevacid [lansoprazole] Other (See Comments)   Cannot take with plavix        Medication List        Accurate as of March 31, 2023 12:44 PM. If you have any questions, ask your nurse or doctor.          STOP taking these medications    Arexvy 120 MCG/0.5ML injection Generic drug: RSV vaccine recomb adjuvanted Stopped by: Troy Willis   cephALEXin 500 MG capsule Commonly known as: KEFLEX Stopped by: Troy Willis   COVID-19 mRNA vaccine [phone] Susp injection Commonly known as: COMIRNATY Stopped by: Troy Willis   doxycycline 100 MG capsule Commonly known as: VIBRAMYCIN Stopped by: Troy Willis   Fluad Quadrivalent 0.5 ML injection Generic drug: influenza vaccine adjuvanted Stopped by: Troy Willis   Melatonin 5 MG Caps Stopped by: Troy Willis       TAKE these medications    famotidine 20 MG tablet Commonly known as: PEPCID Take 20 mg by  mouth daily as needed for heartburn or indigestion.   Flutter Devi Use as directed   furosemide 20 MG tablet Commonly known as: LASIX Take 1 tablet (20 mg total) by mouth daily.   hydrocortisone 2.5 % ointment Apply 1 application topically 2 (two) times daily as needed (irritation).   Nexlizet 180-10 MG Tabs Generic drug: Bempedoic Acid-Ezetimibe Take 1 tablet by mouth daily.   nitroGLYCERIN 0.4 MG SL tablet Commonly known as: NITROSTAT Place 1 tablet (0.4 mg total) under the tongue every 5 (five) minutes x 3 doses as needed for chest pain.   Pirfenidone 267 MG Tabs Commonly known as: Esbriet Take 1 tab three times daily for 7 days, then 2 tabs three times daily thereafter **low dose as maintenance** What changed: additional instructions   triamcinolone cream 0.1 % Commonly known as: KENALOG Apply 1 application topically daily as needed (rash).   Vitamin D3 50 MCG (2000 UT) Tabs Take 2,000 Units by mouth daily.   Xarelto 20 MG Tabs tablet Generic drug: rivaroxaban Take 1 tablet (20 mg total) by mouth daily with supper.        Allergies:  Allergies  Allergen Reactions   Amiodarone Other (See Comments)    elevated liver enzymes   Atorvastatin  Other (See Comments)    Nocturnal leg cramps   Black Pepper-Turmeric Other (See Comments)    heartburn   Metoprolol Other (See Comments)    Hypotension    Prevacid [Lansoprazole] Other (See Comments)    Cannot take with plavix    Past Medical History, Surgical history, Social history, and Family History were reviewed and updated.  Review of Systems: All other 10 point review of systems is negative.   Physical Exam:  height is 6\' 3"  (1.905 m) and weight is 168 lb 12.8 oz (76.6 kg). His oral temperature is 98.3 F (36.8 C). His blood pressure is 104/47 (abnormal) and his pulse is 70. His respiration is 20 and oxygen saturation is 100%.   Wt Readings from Last 3 Encounters:  03/31/23 168 lb 12.8 oz (76.6 kg)   02/23/23 168 lb 9.6 oz (76.5 kg)  01/17/23 165 lb 12.6 oz (75.2 kg)    Physical Exam Vitals reviewed.  HENT:     Head: Normocephalic and atraumatic.  Eyes:     Pupils: Pupils are equal, round, and reactive to light.  Cardiovascular:     Rate and Rhythm: Normal rate and regular rhythm.     Heart sounds: Normal heart sounds.     Comments: Cardiac exam is regular rate and rhythm consistent with atrial fibrillation.  He has no murmurs, rubs or bruits. Pulmonary:     Effort: Pulmonary effort is normal.     Breath sounds: Normal breath sounds.  Abdominal:     General: Bowel sounds are normal.     Palpations: Abdomen is soft.  Musculoskeletal:        General: No tenderness or deformity. Normal range of motion.     Cervical back: Normal range of motion.  Lymphadenopathy:     Cervical: No cervical adenopathy.  Skin:    General: Skin is warm and dry.     Findings: No erythema or rash.  Neurological:     Mental Status: He is alert and oriented to person, place, and time.  Psychiatric:        Behavior: Behavior normal.        Thought Content: Thought content normal.        Judgment: Judgment normal.      Lab Results  Component Value Date   WBC 4.3 03/31/2023   HGB 10.8 (L) 03/31/2023   HCT 32.2 (L) 03/31/2023   MCV 103.9 (H) 03/31/2023   PLT 145 (L) 03/31/2023   Lab Results  Component Value Date   FERRITIN 147 09/28/2022   IRON 116 09/28/2022   TIBC 284 09/28/2022   UIBC 168 09/28/2022   IRONPCTSAT 41 (H) 09/28/2022   Lab Results  Component Value Date   RETICCTPCT 1.2 09/28/2022   RBC 3.10 (L) 03/31/2023   No results found for: "KPAFRELGTCHN", "LAMBDASER", "KAPLAMBRATIO" No results found for: "IGGSERUM", "IGA", "IGMSERUM" No results found for: "TOTALPROTELP", "ALBUMINELP", "A1GS", "A2GS", "BETS", "BETA2SER", "GAMS", "MSPIKE", "SPEI"   Chemistry      Component Value Date/Time   NA 139 11/03/2022 1007   NA 139 07/12/2021 0821   K 5.0 11/03/2022 1007   CL 103  11/03/2022 1007   CO2 30 11/03/2022 1007   BUN 25 (H) 11/03/2022 1007   BUN 19 07/12/2021 0821   CREATININE 0.94 11/03/2022 1007   CREATININE 0.97 09/28/2022 0748   CREATININE 0.86 04/01/2016 0829      Component Value Date/Time   CALCIUM 9.9 11/03/2022 1007   ALKPHOS 55 11/03/2022 1007  AST 26 11/03/2022 1007   AST 27 09/28/2022 0748   ALT 13 11/03/2022 1007   ALT 25 09/28/2022 0748   BILITOT 0.4 11/03/2022 1007   BILITOT 0.8 09/28/2022 0748       Impression and Plan: Ms. Goldsberry is a very pleasant 77 yo caucasian gentleman with macrocytic anemia.  Now, he is a little bit more anemic.  I am little bit troubled by this.  I do not know if this might be from the new medication that is on for the pulmonary fibrosis or if this is actual bone marrow failure.  I think for right now, we probably will just follow this along.  I will have to get him back to see me probably in about 4 weeks or so.  If his hemoglobin is lower, we clearly will have to get a bone marrow biopsy.  He to be invasive but I think that this would be the only avenue of evaluation for [place].  I will see him back before Christmas.   Troy Macho, MD 11/15/202412:44 PM

## 2023-04-02 LAB — PSA, TOTAL AND FREE
PSA, Free Pct: 30 %
PSA, Free: 0.09 ng/mL
Prostate Specific Ag, Serum: 0.3 ng/mL (ref 0.0–4.0)

## 2023-04-03 ENCOUNTER — Encounter: Payer: Self-pay | Admitting: *Deleted

## 2023-04-11 DIAGNOSIS — S61012A Laceration without foreign body of left thumb without damage to nail, initial encounter: Secondary | ICD-10-CM | POA: Diagnosis not present

## 2023-04-18 ENCOUNTER — Telehealth: Payer: Self-pay | Admitting: Internal Medicine

## 2023-04-18 DIAGNOSIS — J84112 Idiopathic pulmonary fibrosis: Secondary | ICD-10-CM

## 2023-04-18 DIAGNOSIS — J849 Interstitial pulmonary disease, unspecified: Secondary | ICD-10-CM

## 2023-04-18 NOTE — Telephone Encounter (Signed)
Patient able to tolerate the medicine and needs a refill of esbriet.

## 2023-04-19 ENCOUNTER — Other Ambulatory Visit: Payer: Self-pay

## 2023-04-19 ENCOUNTER — Telehealth: Payer: Self-pay | Admitting: Pharmacist

## 2023-04-19 ENCOUNTER — Other Ambulatory Visit (HOSPITAL_COMMUNITY): Payer: Self-pay

## 2023-04-19 MED ORDER — PIRFENIDONE 267 MG PO TABS
801.0000 mg | ORAL_TABLET | Freq: Three times a day (TID) | ORAL | 5 refills | Status: DC
  Filled 2023-04-19: qty 270, 30d supply, fill #0
  Filled 2023-05-11: qty 270, 30d supply, fill #1
  Filled 2023-06-26: qty 270, 30d supply, fill #2

## 2023-04-19 NOTE — Telephone Encounter (Signed)
Patient came in to drop off [facility] [facility] in pharmacy mailbox

## 2023-04-19 NOTE — Telephone Encounter (Signed)
Per test claim, copay is [phone]. Rx sent to [facility] for maintenance. He actually has been taking 3 tablets three times daily and doing relatively well with it (some upset stomach and reports nightmares). He'd like to go down to 2 tablets three times daily for a few weeks and will increase back to 3 tabs three times daily if doing okay)  Patient reports having about 3 days of medication left. Advised that Mills Koller will reach out to schedule shipment to home  Chesley Mires, PharmD, MPH, BCPS, CPP Clinical Pharmacist (Rheumatology and Pulmonology)

## 2023-04-19 NOTE — Progress Notes (Signed)
Specialty Pharmacy Initial Fill Coordination Note  Troy Willis is a 77 y.o. male contacted today regarding initial fill of specialty medication(s) Pirfenidone   Patient requested Delivery   Delivery date: 04/21/23   Verified address: [address]   [place] [address]   Medication will be filled on 05/20/22.   Patient has grant on file and is aware of $0 copayment.

## 2023-04-20 ENCOUNTER — Other Ambulatory Visit: Payer: Self-pay

## 2023-04-20 NOTE — Progress Notes (Signed)
Patient received sample supply last month. Directions: Take 1 tab three times daily for 7 days, then 2 tabs three times daily thereafter.  Plan was to keep patient at low dose of 2 tabs three times daily but patient continued to 3 tabs three times daily and was generally tolerating well   Reports that he is experiencing some nightmares and GI upset stomach for a couple of hours after dose. Reducing dose to 534mg  three times daily x 2 weeks and if tolerated can re-trial if needed at 801mg  three times daily  Will recheck LFTs at 1 month then monthly for first 6 months then every 3 months  Chesley Mires, PharmD, MPH, BCPS, CPP Clinical Pharmacist (Rheumatology and Pulmonology)

## 2023-04-24 NOTE — Telephone Encounter (Signed)
Receide. Was pulmonary fibrosis grant information which we already had

## 2023-05-04 ENCOUNTER — Inpatient Hospital Stay: Payer: PPO | Admitting: Hematology & Oncology

## 2023-05-04 ENCOUNTER — Encounter: Payer: Self-pay | Admitting: Hematology & Oncology

## 2023-05-04 ENCOUNTER — Inpatient Hospital Stay: Payer: PPO | Attending: Family

## 2023-05-04 ENCOUNTER — Other Ambulatory Visit: Payer: Self-pay

## 2023-05-04 VITALS — BP 116/66 | HR 70 | Temp 97.8°F | Resp 20 | Ht 75.0 in | Wt 168.0 lb

## 2023-05-04 DIAGNOSIS — D518 Other vitamin B12 deficiency anemias: Secondary | ICD-10-CM

## 2023-05-04 DIAGNOSIS — D539 Nutritional anemia, unspecified: Secondary | ICD-10-CM | POA: Insufficient documentation

## 2023-05-04 DIAGNOSIS — D530 Protein deficiency anemia: Secondary | ICD-10-CM

## 2023-05-04 DIAGNOSIS — J8410 Pulmonary fibrosis, unspecified: Secondary | ICD-10-CM | POA: Diagnosis not present

## 2023-05-04 LAB — CMP (CANCER CENTER ONLY)
ALT: 11 U/L (ref 0–44)
AST: 22 U/L (ref 15–41)
Albumin: 4.3 g/dL (ref 3.5–5.0)
Alkaline Phosphatase: 50 U/L (ref 38–126)
Anion gap: 6 (ref 5–15)
BUN: 18 mg/dL (ref 8–23)
CO2: 31 mmol/L (ref 22–32)
Calcium: 10.3 mg/dL (ref 8.9–10.3)
Chloride: 101 mmol/L (ref 98–111)
Creatinine: 0.94 mg/dL (ref 0.61–1.24)
GFR, Estimated: >60 mL/min (ref >60)
Glucose, Bld: 78 mg/dL (ref 70–99)
Potassium: 4.3 mmol/L (ref 3.5–5.1)
Sodium: 138 mmol/L (ref 135–145)
Total Bilirubin: 0.5 mg/dL (ref <1.2)
Total Protein: 7.9 g/dL (ref 6.5–8.1)

## 2023-05-04 LAB — IRON AND IRON BINDING CAPACITY (CC-WL,HP ONLY)
Iron: 88 ug/dL (ref 45–182)
Saturation Ratios: 27 % (ref 17.9–39.5)
TIBC: 330 ug/dL (ref 250–450)
UIBC: 242 ug/dL (ref 117–376)

## 2023-05-04 LAB — CBC WITH DIFFERENTIAL (CANCER CENTER ONLY)
Abs Immature Granulocytes: 0.06 10*3/uL (ref 0.00–0.07)
Basophils Absolute: 0.1 10*3/uL (ref 0.0–0.1)
Basophils Relative: 1 %
Eosinophils Absolute: 0.1 10*3/uL (ref 0.0–0.5)
Eosinophils Relative: 2 %
HCT: 34.5 % — ABNORMAL LOW (ref 39.0–52.0)
Hemoglobin: 11.5 g/dL — ABNORMAL LOW (ref 13.0–17.0)
Immature Granulocytes: 1 %
Lymphocytes Relative: 31 %
Lymphs Abs: 1.6 10*3/uL (ref 0.7–4.0)
MCH: 35 pg — ABNORMAL HIGH (ref 26.0–34.0)
MCHC: 33.3 g/dL (ref 30.0–36.0)
MCV: 104.9 fL — ABNORMAL HIGH (ref 80.0–100.0)
Monocytes Absolute: 0.6 10*3/uL (ref 0.1–1.0)
Monocytes Relative: 11 %
Neutro Abs: 2.6 10*3/uL (ref 1.7–7.7)
Neutrophils Relative %: 54 %
Platelet Count: 190 10*3/uL (ref 150–400)
RBC: 3.29 MIL/uL — ABNORMAL LOW (ref 4.22–5.81)
RDW: 13.2 % (ref 11.5–15.5)
WBC Count: 4.9 10*3/uL (ref 4.0–10.5)
nRBC: 0 % (ref 0.0–0.2)

## 2023-05-04 LAB — RETICULOCYTES
Immature Retic Fract: 10 % (ref 2.3–15.9)
RBC.: 3.3 MIL/uL — ABNORMAL LOW (ref 4.22–5.81)
Retic Count, Absolute: 34 10*3/uL (ref 19.0–186.0)
Retic Ct Pct: 1 % (ref 0.4–3.1)

## 2023-05-04 LAB — LACTATE DEHYDROGENASE: LDH: 167 U/L (ref 98–192)

## 2023-05-04 LAB — VITAMIN B12: Vitamin B-12: 1455 pg/mL — ABNORMAL HIGH (ref 180–914)

## 2023-05-04 LAB — FERRITIN: Ferritin: 76 ng/mL (ref 24–336)

## 2023-05-04 NOTE — Progress Notes (Signed)
Hematology and Oncology Follow Up Visit  Troy Willis [id number] 1946/03/04 77 y.o. 05/04/2023   Principle Diagnosis:  Macrocytic anemia    Current Therapy:        B12 2500 mcg PO daily   Interim History:  Troy Willis is here today for follow-up.  So far, everything is going okay for him.  He and his family had a wonderful Thanksgiving together.  He has had no real complaints.  He is doing well with his pulmonary fibrosis.  He is not having any increased shortness of breath.  He has had no rashes.  There is been no bleeding.  He has had no nausea or vomiting.  There has been no leg swelling.  He has had no problems with his appetite.  He has had no diarrhea.  There is been no urinary issues.  His last vitamin B12 level was 1290.  He has had no swollen lymph nodes.  There has been no mouth sores.  He has had no numbness or tingling in his hands or feet.  Overall, I would say his performance status is probably ECOG 1.     Medications:  Allergies as of 05/04/2023       Reactions   Amiodarone Other (See Comments)   elevated liver enzymes   Atorvastatin Other (See Comments)   Nocturnal leg cramps   Black Pepper-turmeric Other (See Comments)   heartburn   Metoprolol Other (See Comments)   Hypotension   Prevacid [lansoprazole] Other (See Comments)   Cannot take with plavix        Medication List        Accurate as of May 04, 2023  8:38 AM. If you have any questions, ask your nurse or doctor.          famotidine 20 MG tablet Commonly known as: PEPCID Take 20 mg by mouth daily as needed for heartburn or indigestion.   Flutter Devi Use as directed   furosemide 20 MG tablet Commonly known as: LASIX Take 1 tablet (20 mg total) by mouth daily.   hydrocortisone 2.5 % ointment Apply 1 application topically 2 (two) times daily as needed (irritation).   Nexlizet 180-10 MG Tabs Generic drug: Bempedoic Acid-Ezetimibe Take 1 tablet by mouth daily.    nitroGLYCERIN 0.4 MG SL tablet Commonly known as: NITROSTAT Place 1 tablet (0.4 mg total) under the tongue every 5 (five) minutes x 3 doses as needed for chest pain.   Pirfenidone 267 MG Tabs Commonly known as: Esbriet Take 3 tablets (801 mg total) by mouth 3 (three) times daily with meals. What changed: how much to take   triamcinolone cream 0.1 % Commonly known as: KENALOG Apply 1 application topically daily as needed (rash).   Vitamin D3 50 MCG (2000 UT) Tabs Take 2,000 Units by mouth daily.   Xarelto 20 MG Tabs tablet Generic drug: rivaroxaban Take 1 tablet (20 mg total) by mouth daily with supper.        Allergies:  Allergies  Allergen Reactions   Amiodarone Other (See Comments)    elevated liver enzymes   Atorvastatin Other (See Comments)    Nocturnal leg cramps   Black Pepper-Turmeric Other (See Comments)    heartburn   Metoprolol Other (See Comments)    Hypotension    Prevacid [Lansoprazole] Other (See Comments)    Cannot take with plavix    Past Medical History, Surgical history, Social history, and Family History were reviewed and updated.  Review of Systems: All other  10 point review of systems is negative.   Physical Exam:  height is 6\' 3"  (1.905 m) and weight is 168 lb (76.2 kg). His oral temperature is 97.8 F (36.6 C). His blood pressure is 116/66 and his pulse is 70. His respiration is 20 and oxygen saturation is 100%.   Wt Readings from Last 3 Encounters:  05/04/23 168 lb (76.2 kg)  03/31/23 168 lb 12.8 oz (76.6 kg)  02/23/23 168 lb 9.6 oz (76.5 kg)    Physical Exam Vitals reviewed.  HENT:     Head: Normocephalic and atraumatic.  Eyes:     Pupils: Pupils are equal, round, and reactive to light.  Cardiovascular:     Rate and Rhythm: Normal rate and regular rhythm.     Heart sounds: Normal heart sounds.     Comments: Cardiac exam is regular rate and rhythm consistent with atrial fibrillation.  He has no murmurs, rubs or  bruits. Pulmonary:     Effort: Pulmonary effort is normal.     Breath sounds: Normal breath sounds.  Abdominal:     General: Bowel sounds are normal.     Palpations: Abdomen is soft.  Musculoskeletal:        General: No tenderness or deformity. Normal range of motion.     Cervical back: Normal range of motion.  Lymphadenopathy:     Cervical: No cervical adenopathy.  Skin:    General: Skin is warm and dry.     Findings: No erythema or rash.  Neurological:     Mental Status: He is alert and oriented to person, place, and time.  Psychiatric:        Behavior: Behavior normal.        Thought Content: Thought content normal.        Judgment: Judgment normal.      Lab Results  Component Value Date   WBC 4.9 05/04/2023   HGB 11.5 (L) 05/04/2023   HCT 34.5 (L) 05/04/2023   MCV 104.9 (H) 05/04/2023   PLT 190 05/04/2023   Lab Results  Component Value Date   FERRITIN 147 09/28/2022   IRON 116 09/28/2022   TIBC 284 09/28/2022   UIBC 168 09/28/2022   IRONPCTSAT 41 (H) 09/28/2022   Lab Results  Component Value Date   RETICCTPCT 1.0 05/04/2023   RBC 3.30 (L) 05/04/2023   No results found for: "KPAFRELGTCHN", "LAMBDASER", "KAPLAMBRATIO" No results found for: "IGGSERUM", "IGA", "IGMSERUM" No results found for: "TOTALPROTELP", "ALBUMINELP", "A1GS", "A2GS", "BETS", "BETA2SER", "GAMS", "MSPIKE", "SPEI"   Chemistry      Component Value Date/Time   NA 136 03/31/2023 1205   NA 139 07/12/2021 0821   K 4.3 03/31/2023 1205   CL 102 03/31/2023 1205   CO2 29 03/31/2023 1205   BUN 20 03/31/2023 1205   BUN 19 07/12/2021 0821   CREATININE 1.08 03/31/2023 1205   CREATININE 0.86 04/01/2016 0829      Component Value Date/Time   CALCIUM 9.8 03/31/2023 1205   ALKPHOS 51 03/31/2023 1205   AST 29 03/31/2023 1205   ALT 14 03/31/2023 1205   BILITOT 0.4 03/31/2023 1205       Impression and Plan: Troy Willis is a very pleasant 77 yo caucasian gentleman with macrocytic anemia.  I am glad  to see that his hemoglobin is back up a little bit.  I suspect this just might be the nature of his disease.  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  However, I really  do not think that we have to pursue a bone marrow biopsy on right now.  He is totally asymptomatic.  His blood counts were not that bad.  His white cell differential looks normal.  Under the microscope, I really do not see anything that looks suspicious from my point of view.  I will try to get him back after the Winter season.  I do not want to have him come in and have any problems traveling with any bad weather.  I know he comes in with his wife who we see.  If there is any problems he will certainly let us know.    Josph Macho, MD 12/19/20248:38 AM

## 2023-05-05 LAB — ERYTHROPOIETIN: Erythropoietin: 21 m[IU]/mL — ABNORMAL HIGH (ref 2.6–18.5)

## 2023-05-09 ENCOUNTER — Other Ambulatory Visit: Payer: Self-pay

## 2023-05-11 ENCOUNTER — Other Ambulatory Visit: Payer: Self-pay

## 2023-05-11 NOTE — Progress Notes (Signed)
Specialty Pharmacy Refill Coordination Note  Troy Willis is a 77 y.o. male contacted today regarding refills of specialty medication(s) Pirfenidone   Patient requested (Patient-Rptd) Pickup at [facility] date: (Patient-Rptd) 05/31/23   Medication will be filled on 01.14.25.

## 2023-05-12 ENCOUNTER — Other Ambulatory Visit (HOSPITAL_COMMUNITY): Payer: Self-pay

## 2023-05-22 DIAGNOSIS — S61012A Laceration without foreign body of left thumb without damage to nail, initial encounter: Secondary | ICD-10-CM | POA: Diagnosis not present

## 2023-05-24 ENCOUNTER — Other Ambulatory Visit: Payer: Self-pay | Admitting: *Deleted

## 2023-05-24 DIAGNOSIS — J849 Interstitial pulmonary disease, unspecified: Secondary | ICD-10-CM

## 2023-05-25 ENCOUNTER — Ambulatory Visit: Payer: PPO | Admitting: Internal Medicine

## 2023-05-25 ENCOUNTER — Encounter: Payer: Self-pay | Admitting: Internal Medicine

## 2023-05-25 VITALS — BP 100/63 | HR 69 | Ht 75.0 in | Wt 170.6 lb

## 2023-05-25 DIAGNOSIS — J84112 Idiopathic pulmonary fibrosis: Secondary | ICD-10-CM

## 2023-05-25 DIAGNOSIS — Z836 Family history of other diseases of the respiratory system: Secondary | ICD-10-CM

## 2023-05-25 DIAGNOSIS — Z5181 Encounter for therapeutic drug level monitoring: Secondary | ICD-10-CM | POA: Diagnosis not present

## 2023-05-25 DIAGNOSIS — J849 Interstitial pulmonary disease, unspecified: Secondary | ICD-10-CM

## 2023-05-25 LAB — HEPATIC FUNCTION PANEL
ALT: 14 U/L (ref 0–53)
AST: 28 U/L (ref 0–37)
Albumin: 4.3 g/dL (ref 3.5–5.2)
Alkaline Phosphatase: 55 U/L (ref 39–117)
Bilirubin, Direct: 0.1 mg/dL (ref 0.0–0.3)
Total Bilirubin: 0.3 mg/dL (ref 0.2–1.2)
Total Protein: 7.7 g/dL (ref 6.0–8.3)

## 2023-05-25 LAB — PULMONARY FUNCTION TEST
DL/VA % pred: 75 %
DL/VA: 2.93 ml/min/mmHg/L
DLCO cor % pred: 52 %
DLCO cor: 14.63 ml/min/mmHg
DLCO unc % pred: 48 %
DLCO unc: 13.38 ml/min/mmHg
FEF 25-75 Pre: 1.8 L/s
FEF2575-%Pred-Pre: 73 %
FEV1-%Pred-Pre: 68 %
FEV1-Pre: 2.37 L
FEV1FVC-%Pred-Pre: 102 %
FEV6-%Pred-Pre: 71 %
FEV6-Pre: 3.22 L
FEV6FVC-%Pred-Pre: 106 %
FVC-%Pred-Pre: 66 %
FVC-Pre: 3.22 L
Pre FEV1/FVC ratio: 74 %
Pre FEV6/FVC Ratio: 100 %

## 2023-05-25 NOTE — Progress Notes (Signed)
 OV 11/03/2022  Subjective:  Patient ID: Kanen Mottola, male , DOB: 1945-08-22 , age 78 y.o. , MRN: [id number] , ADDRESS: [address] [place] [address] PCP Copland, Harlene BROCKS, MD Patient Care Team: Copland, Harlene BROCKS, MD as PCP - General (Family Medicine) Shlomo Wilbert SAUNDERS, MD as PCP - Cardiology (Cardiology) Cleatus Collar, MD as Consulting Physician (Ophthalmology) Waddell Danelle ORN, MD as Consulting Physician (Cardiology) Ivin Kocher, MD as Consulting Physician (Dermatology) Avram Lupita BRAVO, MD as Consulting Physician (Gastroenterology) Shlomo Wilbert SAUNDERS, MD as Consulting Physician (Cardiology) Alaine Vicenta NOVAK, MD as Consulting Physician (Pulmonary Disease)  This Provider for this visit: Treatment Team:  Attending Provider: Brenna Adine CROME, DO    11/03/2022 -   Chief Complaint  Patient presents with   Consult    Consult for ILD     HPI Eidan Muellner 78 y.o. -he is a retired art gallery manager.  Is a transfer of care from Dr. Vicenta Alaine who is no longer with the practice.  Review of the records indicate he has been seen for bronchiectasis and also ILD.  It appears with Dr. Alaine in January 2024 slow progression and evolution of the CT findings was discussed and they took a shared decision making to avoid antifibrotic's.  He tells me he was in the [employer] and at some point got [place] flu.  After that he was having recurrent bronchitis for few years and then got better.  But at some point abnormal CT scan was discovered.  He says that when he gets respiratory infections is a lot more severe.  He normally walks 2 miles on a regular basis but for the last 1 year has been taking care of his wife with Hodgkin's disease and has been sedentary.  If he feels a little bit more short of breath because of that.  He also has episodic shortness of breath because of A-fib.  He does not believe his increase shortness of breath is because of changes in his ILD  pattern on CT scan.  Tomorrow 11/04/2023 starting pulmonary rehabilitation.   [facility] Comprehensive ILD Questionnaire  Symptoms:   Shortness of breath for 10-15 years.  Has difficulty keeping up with others of his age slowly progressive.  But he attributes this to being sedentary in the last 1 year and having atrial fibrillation.  This cough also started 10-15 years ago is episodic is occasional sometimes clear sometimes brown sputum.  Very trace amounts.  He does clear the throat.  He said ENT evaluation in 2017 because of his hoarse voice and it was normal.  He gets intermittent bronchitis.    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  Previously BNP high but no evidence of right heart catheterization - Has sleep apnea uses CPAP - Has history of bladder infection has had kidney stones in 2022. - Has history of pneumonia in 2017 every couple of years - Had rotator cuff repair in 2022 - Has had COVID-vaccine but also has had COVID disease 2 times most recent 13 Sep 2020  ROS:  0 has fatigue on and off - Does have dysphagia occasionally for large pills - Sensitive to cold weather - Has lost 10 pounds over the last few years - Has significant heartburn  FAMILY HISTORY of LUNG DISEASE:  2 sisters with PF  PERSONAL EXPOSURE HISTORY:  -He will turn smokers for 10 years.  His father smoked and he lived until he was 63 years old.  HOME  EXPOSURE and HOBBY DETAILS :  -Single-family home.  Large a lot.  Until September 2023 lived in a house for 45 years.  Current home is only 79 years old.  He does have a down comforter on the master bed for many years.  Otherwise home organic antigen exposure history is negative but he does rake leaves in his yard occasionally cut down trees.  He also is a do not feel the winter  jacket  OCCUPATIONAL HISTORY (122 questions) : Retired since 2004.  He uses machines for [employer].  Patient ran a machine shop for 10 years.SABRA  He is on some sandblasting.  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   INVESTIGATIONS: Below     CT Chest data - HRCT 09/21/22 personally visualized and independently interpreted and my findings are: Personally visualized the CT chest agree with the progression which is my independent assessment.  I also showed him those findings from 2017 through 2020 through 2024.   Narrative & Impression  CLINICAL DATA:  78 year old male history of interstitial lung disease. Follow-up study.   EXAM: CT CHEST WITHOUT CONTRAST   TECHNIQUE: Multidetector CT imaging of the chest was performed following the standard protocol without intravenous contrast. High resolution imaging of the lungs, as well as inspiratory and expiratory imaging, was performed.   RADIATION DOSE REDUCTION: This exam was performed according to the departmental dose-optimization program which includes automated exposure control, adjustment of the mA and/or kV according to patient size and/or use of iterative reconstruction technique.   COMPARISON:  High-resolution chest CT 04/22/2022.   FINDINGS: Cardiovascular: Heart size is enlarged with biatrial dilatation. There is no significant pericardial fluid, thickening or pericardial calcification. There is aortic atherosclerosis, as well as atherosclerosis of the great vessels of the mediastinum and the coronary arteries, including calcified atherosclerotic plaque in the left main, left anterior descending, left circumflex and right coronary arteries.   Mediastinum/Nodes: No pathologically enlarged mediastinal or hilar lymph nodes. Please note that accurate exclusion of hilar adenopathy is limited on  noncontrast CT scans. Esophagus is unremarkable in appearance. No axillary lymphadenopathy.   Lungs/Pleura: High-resolution images again demonstrate some patchy areas of mild ground-glass attenuation, scattered septal thickening, subpleural reticulation, mild cylindrical traction bronchiectasis, peripheral bronchiolectasis, thickening of the peribronchovascular interstitium and regional areas of architectural distortion. These findings have a definitive craniocaudal gradient and are minimally progressive compared to the prior study. No frank honeycombing confidently identified. Inspiratory and expiratory imaging is unremarkable. Some nodular areas of architectural distortion are again noted, most evident in the right lower lobe where the largest nodular area measures 1.5 x 1.3 cm (axial image 130 of series 4), similar to numerous prior examinations, most compatible with an area of chronic scarring or confluent fibrosis. No other larger more suspicious appearing pulmonary nodules or masses are noted. Emphysematous changes are also noted, with generally mild centrilobular and paraseptal emphysema, although a few scattered bulla are noted.   Upper Abdomen: Aortic atherosclerosis.   Musculoskeletal: There are no aggressive appearing lytic or blastic lesions noted in the visualized portions of the skeleton.   IMPRESSION: 1. 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 2. Cardiomegaly with biatrial dilatation. 3. Aortic atherosclerosis, in addition to left main and three-vessel coronary artery disease. Assessment for potential risk factor modification, dietary therapy or pharmacologic therapy may be warranted, if clinically indicated. 4. Emphysema.   Aortic  Atherosclerosis (ICD10-I70.0) and Emphysema (ICD10-J43.9).     Electronically Signed   By: Toribio Aye M.D.   On: 09/25/2022 12:10       OV 02/23/2023  Subjective:  Patient ID: [name], male , DOB: 11/02/1945 , age 52 y.o. , MRN: [id number] , ADDRESS: [address] [place] [address] PCP Copland, Harlene BROCKS, MD Patient Care Team: Copland, Harlene BROCKS, MD as PCP - General (Family Medicine) Shlomo Wilbert SAUNDERS, MD as PCP - Cardiology (Cardiology) Cleatus Collar, MD as Consulting Physician (Ophthalmology) Waddell Danelle ORN, MD as Consulting Physician (Cardiology) Ivin Kocher, MD as Consulting Physician (Dermatology) Avram Lupita BRAVO, MD as Consulting Physician (Gastroenterology) Shlomo Wilbert SAUNDERS, MD as Consulting Physician (Cardiology) Alaine Vicenta NOVAK, MD (Inactive) as Consulting Physician (Pulmonary Disease)  This Provider for this visit: Treatment Team:  Attending Provider: Geronimo Amel, MD    02/23/2023 -   Chief Complaint  Patient presents with   Follow-up    Pt denies any concerns     HPI Rylen Swindler 78 y.o. -here for pulmonary fibrosis follow-up.  Since last visit he tells me that his wife's health is better.  His caregiver burden has improved.  He is doing pulmonary rehabilitation and this is improved his symptoms.  However he had lung function test today.  The FVC is down to 4% and the DLCO is down by greater than 20%.  On average is a 14% decline but his symptoms and exercise hypoxemia test do not fit in with this.  Nevertheless this seems to be definite decline in numbers over time.  I did show him this.  I did indicate to him that the diagnosis of IPF based on age greater than 78, Caucasian, male, probable UIP on CT scan and family history of fibrosis and progression.  He has already seen the genetics counselor.  Details not known.  Indicated to him that antifibrotic's indicated.  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  Went over pirfenidone  in detail    Esbriet  (Pirfenidone ) would be the anti-fibrotic of choice -  Instructions   - Slowly increase the dose per protocol  -Always take it with food  -Any nausea you can try ginger capsules  -Give at least between 5 and 6 hours between dosing  - -Definitely apply sunscreen when you go out with this medication  - You will need monthly liver tests for 6 months and thereafter every 3 months   He is not ready to decide on pirfenidone .  But he will let us  know in 1 week.  Offered for him to meet with the pharmacy team but he said he will do some research on his own and also he took down notes.    OV 05/25/2023  Subjective:  Patient ID: Tarek Cravens, male , DOB: 09/23/1945 , age 78 y.o. , MRN: [id number] , ADDRESS: [address] [place] [address] PCP Copland, Harlene BROCKS, MD Patient Care Team: Copland, Harlene BROCKS, MD as PCP - General (Family Medicine) Shlomo Wilbert SAUNDERS, MD as PCP - Cardiology (Cardiology) Cleatus Collar, MD  as Consulting Physician (Ophthalmology) Waddell Danelle ORN, MD as Consulting Physician (Cardiology) Ivin Kocher, MD as Consulting Physician (Dermatology) Avram Lupita BRAVO, MD as Consulting Physician (Gastroenterology) Shlomo Wilbert SAUNDERS, MD as Consulting Physician (Cardiology) Alaine Vicenta NOVAK, MD as Consulting Physician (Pulmonary Disease)  This Provider for this visit: Treatment Team:  Attending Provider: Geronimo Amel, MD  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 -sarted pirfenidone  around November 2024.  He went up to 3 pills 3 times daily and then chronically had insomnia and nightmares.  He then lowered it back t 3 pills 2 times daily which is low-dose protocol which is still effective.  He is tolerating it well.  No side effects.  He had pulmonary function test today.  FVC appears stable and the DLCO is also stable compared to September 2024.  The FVC might be down by 1 point at the second decimal but I suspect this is just variation.  I did show him these results.  He has no interim complaints.  No new medical problems no emergency visits no hospitalizations no surgeries.  I introduced the concept of clinical research as a care option.  He processed this information by listening.  Introduced into pulm fibrosis website and also gave him a leaflet on upcoming webinar on clinical trial sponsored by pulmonary fibrosis foundation.  Did indicate to him at the next visit in 6 weeks we can try to increase his pirfenidone  but will also continue to monitor by having a repeat pulmonary function test in 4 months and doing a face-to-face visit.  He is agreeable with this plan.    SYMPTOM SCALE - ILD 11/03/2022 02/23/2023  05/25/2023 Esbreit new start lwo dose protoco  170#  Current weight  S/p rehab   O2 use ra ra ra  Shortness of Breath 0 -> 5 scale with 5 being worst (score 6 If unable to do)    At rest 1 0.5 0  Simple tasks - showers, clothes change, eating, shaving 1 0.5 0  Household (dishes, doing bed, laundry) 1 1 1   Shopping 2 0.5 1  Walking level at own pace 3 0.5 1  Walking up Stairs 4 4.5 4 for 3 floors  Total (30-36) Dyspnea Score 12 7.5 7      Non-dyspnea symptoms (0-> 5 scale) 11/03/2022 02/23/2023  05/25/2023   How bad is your cough? 0 to 5 1 1   How bad is your fatigue 3 0.5 1.5  How bad is nausea 2 0 0  How bad is vomiting?  0 0 0  How bad is diarrhea? 0 0 0  How bad is anxiety? 2 0 0.5  How bad is  depression 0 0 0  Any chronic pain - if so where and how bad 0 0       Simple office walk 224 (66+46 x 2) feet Pod A at [facility] x  3 laps goal with forehead probe 02/23/2023    O2 used ra   Number laps  completed Sit/stand x 15   Comments about pace regular   Resting Pulse Ox/HR 97% and 98/min   Final Pulse Ox/HR 98%/min   Desaturated </= 88% no   Desaturated <= 3% points no   Got Tachycardic >/= 90/min no   Symptoms at end of test winderd   Miscellaneous comments x      PFT     Latest Ref Rng & Units 05/25/2023   10:42 AM 02/13/2023    3:22 PM 04/01/2022    9:49 AM 03/23/2021    8:57 AM 10/31/2018    8:49 AM 04/13/2017   10:56 AM 10/07/2016    8:34 AM  ILD indicators  FVC-Pre L 3.22  P 3.25  3.38  3.62  4.03  P 3.97  4.04   FVC-Predicted Pre % 66  P 67  69  74  81  P 78  77   FVC-Post L   3.47  3.73  4.15  P 4.01  4.25   FVC-Predicted Post %   71  76  83  P 79  81   TLC L   5.62  5.83  6.40  P 6.30  6.30   TLC Predicted %   71  74  81  P 80  78   DLCO uncorrected ml/min/mmHg 13.38  P 11.84  15.69  15.10  17.98  P 17.46  17.90   DLCO UNC %Pred % 48  P 42  56  53  63  P 46  45   DLCO Corrected ml/min/mmHg 14.63  P 11.84  15.69  15.10   19.72  18.62   DLCO COR %Pred % 52  P 42  56  53   52  47     P Preliminary result      LAB RESULTS last 96 hours No results found.  LAB RESULTS last 90 days Recent Results (from the past 2160 hours)  CBC with Differential (Cancer Center Only)     Status: Abnormal   Collection Time: 03/31/23 12:05 PM  Result Value Ref Range   WBC Count 4.3 4.0 - 10.5 K/uL   RBC 3.10 (L) 4.22 - 5.81 MIL/uL   Hemoglobin 10.8 (L) 13.0 - 17.0 g/dL   HCT 67.7 (L) 60.9 - 47.9 %   MCV 103.9 (H) 80.0 - 100.0 fL   MCH 34.8 (H) 26.0 - 34.0 pg   MCHC 33.5 30.0 - 36.0 g/dL   RDW 86.5 88.4 - 84.4 %   Platelet Count 145 (L) 150 - 400 K/uL   nRBC 0.0 0.0 - 0.2 %   Neutrophils Relative % 59 %   Neutro Abs 2.5 1.7 - 7.7 K/uL   Lymphocytes Relative 30 %    Lymphs Abs 1.3 0.7 - 4.0 K/uL   Monocytes Relative 9 %   Monocytes Absolute 0.4 0.1 - 1.0 K/uL   Eosinophils Relative 1 %   Eosinophils Absolute 0.1 0.0 - 0.5 K/uL   Basophils Relative 1 %   Basophils Absolute 0.0 0.0 - 0.1 K/uL   Immature Granulocytes 0 %   Abs Immature Granulocytes 0.01 0.00 - 0.07 K/uL    Comment: Performed at [facility] at [facility], [address] [place], [place] [address]  CMP (Cancer Center only)     Status: Abnormal   Collection Time: 03/31/23 12:05 PM  Result Value Ref Range   Sodium 136 135 - 145 mmol/L   Potassium 4.3 3.5 - 5.1 mmol/L  Chloride 102 98 - 111 mmol/L   CO2 29 22 - 32 mmol/L   Glucose, Bld 103 (H) 70 - 99 mg/dL    Comment: Glucose reference range applies only to samples taken after fasting for at least 8 hours.   BUN 20 8 - 23 mg/dL   Creatinine 8.91 9.38 - 1.24 mg/dL   Calcium  9.8 8.9 - 10.3 mg/dL   Total Protein 7.5 6.5 - 8.1 g/dL   Albumin 4.4 3.5 - 5.0 g/dL   AST 29 15 - 41 U/L   ALT 14 0 - 44 U/L   Alkaline Phosphatase 51 38 - 126 U/L   Total Bilirubin 0.4 <1.2 mg/dL   GFR, Estimated >39 >39 mL/min    Comment: (NOTE) Calculated using the CKD-EPI Creatinine Equation (2021)    Anion gap 5 5 - 15    Comment: Performed at [facility] Lab at [facility], [address], [place], [place] [address]  Lactate dehydrogenase     Status: None   Collection Time: 03/31/23 12:05 PM  Result Value Ref Range   LDH 182 98 - 192 U/L    Comment: Performed at [facility] at [facility], [address] [place], [place] [address]  Save Smear for Provider Slide Review     Status: None   Collection Time: 03/31/23 12:05 PM  Result Value Ref Range   Smear Review SMEAR STAINED AND AVAILABLE FOR REVIEW     Comment: Performed at [facility] Lab at [facility], [address] [place], [place] [address]  Vitamin B12     Status: Abnormal   Collection  Time: 03/31/23 12:05 PM  Result Value Ref Range   Vitamin B-12 1,289 (H) 180 - 914 pg/mL    Comment: (NOTE) This assay is not validated for testing neonatal or myeloproliferative syndrome specimens for Vitamin B12 levels. Performed at [facility], [address], [place], [place] [address]   PSA, total and free     Status: None   Collection Time: 03/31/23 12:05 PM  Result Value Ref Range   PSA, Free 0.09 N/A ng/mL    Comment: Roche ECLIA methodology.   PSA, Free Pct 30.0 %    Comment: (NOTE) The table below lists the probability of prostate cancer for men with non-suspicious DRE results and total PSA between 4 and 10 ng/mL, by patient age Jaycee rosemarie cherry, JAMA 1998, 720:[id number]).                  % Free PSA       50-64 yr        65-75 yr                  0.00-10.00%        56%             55%                 10.01-15.00%        24%             35%                 15.01-20.00%        17%             23%                 20.01-25.00%  10%             20%                      >25.00%         5%              9% Please note:  Catalona et al did not make specific              recommendations regarding the use of              percent free PSA for any other population              of men. Performed At: [facility] [address] [place], [place] [id number] Jennette Shorter MD Ey:[id number]    Prostate Specific Ag, Serum 0.3 0.0 - 4.0 ng/mL    Comment: (NOTE) Roche ECLIA methodology. According to the American Urological Association, Serum PSA should decrease and remain at undetectable levels after radical prostatectomy. The AUA defines biochemical recurrence as an initial PSA value 0.2 ng/mL or greater followed by a subsequent confirmatory PSA value 0.2 ng/mL or greater. Values obtained with different assay methods or kits cannot be used interchangeably. Results cannot be interpreted as absolute evidence of the presence or absence of malignant  disease.   CBC with Differential (Cancer Center Only)     Status: Abnormal   Collection Time: 05/04/23  8:05 AM  Result Value Ref Range   WBC Count 4.9 4.0 - 10.5 K/uL   RBC 3.29 (L) 4.22 - 5.81 MIL/uL   Hemoglobin 11.5 (L) 13.0 - 17.0 g/dL   HCT 65.4 (L) 60.9 - 47.9 %   MCV 104.9 (H) 80.0 - 100.0 fL   MCH 35.0 (H) 26.0 - 34.0 pg   MCHC 33.3 30.0 - 36.0 g/dL   RDW 86.7 88.4 - 84.4 %   Platelet Count 190 150 - 400 K/uL   nRBC 0.0 0.0 - 0.2 %   Neutrophils Relative % 54 %   Neutro Abs 2.6 1.7 - 7.7 K/uL   Lymphocytes Relative 31 %   Lymphs Abs 1.6 0.7 - 4.0 K/uL   Monocytes Relative 11 %   Monocytes Absolute 0.6 0.1 - 1.0 K/uL   Eosinophils Relative 2 %   Eosinophils Absolute 0.1 0.0 - 0.5 K/uL   Basophils Relative 1 %   Basophils Absolute 0.1 0.0 - 0.1 K/uL   Immature Granulocytes 1 %   Abs Immature Granulocytes 0.06 0.00 - 0.07 K/uL    Comment: Performed at [facility] Lab at [facility], [address], [place], [place] [address]  CMP (Cancer Center only)     Status: None   Collection Time: 05/04/23  8:05 AM  Result Value Ref Range   Sodium 138 135 - 145 mmol/L   Potassium 4.3 3.5 - 5.1 mmol/L   Chloride 101 98 - 111 mmol/L   CO2 31 22 - 32 mmol/L   Glucose, Bld 78 70 - 99 mg/dL    Comment: Glucose reference range applies only to samples taken after fasting for at least 8 hours.   BUN 18 8 - 23 mg/dL   Creatinine 9.05 9.38 - 1.24 mg/dL   Calcium  10.3 8.9 - 10.3 mg/dL   Total Protein 7.9 6.5 - 8.1 g/dL   Albumin 4.3 3.5 - 5.0 g/dL   AST 22 15 - 41 U/L   ALT 11 0 - 44 U/L  Alkaline Phosphatase 50 38 - 126 U/L   Total Bilirubin 0.5 <1.2 mg/dL   GFR, Estimated >39 >39 mL/min    Comment: (NOTE) Calculated using the CKD-EPI Creatinine Equation (2021)    Anion gap 6 5 - 15    Comment: Performed at [facility] Lab at [facility], [address] [place], [place] [address]  Erythropoietin      Status: Abnormal   Collection  Time: 05/04/23  8:05 AM  Result Value Ref Range   Erythropoietin  21.0 (H) 2.6 - 18.5 mIU/mL    Comment: (NOTE) [employer] UniCel DxI 800 Immunoassay System Values obtained with different assay methods or kits cannot be used interchangeably. Results cannot be interpreted as absolute evidence of the presence or absence of malignant disease. Performed At: [facility] [address] [place], [place] [id number] Jennette Shorter MD Ey:[id number]   Iron and Iron Binding Capacity ([facility] only)     Status: None   Collection Time: 05/04/23  8:05 AM  Result Value Ref Range   Iron 88 45 - 182 ug/dL   TIBC 669 749 - 549 ug/dL   Saturation Ratios 27 17.9 - 39.5 %   UIBC 242 117 - 376 ug/dL    Comment: Performed at [facility], [address], [place], [place] [address]  Lactate dehydrogenase     Status: None   Collection Time: 05/04/23  8:06 AM  Result Value Ref Range   LDH 167 98 - 192 U/L    Comment: Performed at [facility] Lab at [place] [facility], [address] [place], [place] [address]  Vitamin B12     Status: Abnormal   Collection Time: 05/04/23  8:06 AM  Result Value Ref Range   Vitamin B-12 1,455 (H) 180 - 914 pg/mL    Comment: (NOTE) This assay is not validated for testing neonatal or myeloproliferative syndrome specimens for Vitamin B12 levels. Performed at [facility], [address], [place], [place] [address]   Ferritin     Status: None   Collection Time: 05/04/23  8:06 AM  Result Value Ref Range   Ferritin 76 24 - 336 ng/mL    Comment: Performed at [facility], [address], [place], [place] [address]  Reticulocytes     Status: Abnormal   Collection Time: 05/04/23  8:06 AM  Result Value Ref Range   Retic Ct Pct 1.0 0.4 - 3.1 %   RBC. 3.30 (L) 4.22 - 5.81 MIL/uL   Retic Count, Absolute 34.0 19.0 - 186.0 K/uL   Immature Retic Fract 10.0 2.3 - 15.9 %    Comment:  Performed at [facility] Lab at [facility], [address] [place], [place] [address]  Pulmonary function test     Status: None (Preliminary result)   Collection Time: 05/25/23 10:42 AM  Result Value Ref Range   FVC-Pre 3.22 L   FVC-%Pred-Pre 66 %   FEV1-Pre 2.37 L   FEV1-%Pred-Pre 68 %   FEV6-Pre 3.22 L   FEV6-%Pred-Pre 71 %   Pre FEV1/FVC ratio 74 %   FEV1FVC-%Pred-Pre 102 %   Pre FEV6/FVC Ratio 100 %   FEV6FVC-%Pred-Pre 106 %   FEF 25-75 Pre 1.80 L/sec   FEF2575-%Pred-Pre 73 %   DLCO unc 13.38 ml/min/mmHg   DLCO unc % pred 48 %   DLCO cor 14.63 ml/min/mmHg   DLCO cor % pred 52 %   DL/VA 7.06  ml/min/mmHg/L   DL/VA % pred 75 %         has a past medical history of Acquired dilation of ascending aorta and aortic root (HCC), Anemia, Arthritis, Bronchiectasis (HCC), CAD (coronary artery disease), native coronary artery, Cancer (HCC), Cataracts, bilateral, Depression, Diverticulosis, Dyspnea, Family history of pulmonary fibrosis, GERD (gastroesophageal reflux disease), Hilar adenopathy, adenomatous colonic polyps (08/13/2015), Hyperlipidemia, MVP (mitral valve prolapse), Myocardial infarction 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   reports that he has never smoked. He quit smokeless tobacco use about 45 years ago.  His smokeless tobacco use included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

## 2023-05-25 NOTE — Patient Instructions (Addendum)
 ICD-10-CM   1. IPF (idiopathic pulmonary fibrosis) (HCC)  J84.112     2. Family history of pulmonary fibrosis  Z83.6     3. Medication monitoring encounter  Z51.81      Esbiret full dose caused insomnia and nightmares but tolrting low dose protocl well Disease seems stable relative to sept 2024 and suspect esbriet is beginning to kick in     Plan  - get rid of down jacket, mattress-or any material that contains bird feather at home - cointinue esbriet at 3 pills two times daily till next visit   - apply sunscreen  - check LFT 05/25/2023 and every month x  6months  =- have with food - do spiro.dlcin 4 months - consider clinical trials as care option in future   - visit [email or web address]  - take leaflet on upcming webinar on trials Followup  - video visti in 6 weeks with APP or Dryden Tapley   - if tolerating esbriet well at low dose can try high dose challenge at that ime  -face to face with Jahyra Sukup in \ After spirometry and DLCO in 4 months;   = symptom score and exercise sit stand test at followu

## 2023-05-25 NOTE — Progress Notes (Signed)
 Spirometry/DLCO performed today.

## 2023-05-25 NOTE — Patient Instructions (Signed)
 Spirometry/DLCO performed today.

## 2023-05-27 ENCOUNTER — Other Ambulatory Visit (HOSPITAL_BASED_OUTPATIENT_CLINIC_OR_DEPARTMENT_OTHER): Payer: Self-pay | Admitting: Family

## 2023-05-27 ENCOUNTER — Encounter: Payer: Self-pay | Admitting: Family Medicine

## 2023-05-27 DIAGNOSIS — I080 Rheumatic disorders of both mitral and aortic valves: Secondary | ICD-10-CM

## 2023-05-29 ENCOUNTER — Other Ambulatory Visit: Payer: Self-pay

## 2023-05-29 ENCOUNTER — Other Ambulatory Visit (HOSPITAL_BASED_OUTPATIENT_CLINIC_OR_DEPARTMENT_OTHER): Payer: Self-pay

## 2023-05-29 MED ORDER — FUROSEMIDE 20 MG PO TABS
20.0000 mg | ORAL_TABLET | Freq: Every day | ORAL | 1 refills | Status: DC
  Filled 2023-05-29: qty 90, 90d supply, fill #0
  Filled 2023-08-26: qty 90, 90d supply, fill #1

## 2023-05-29 NOTE — Telephone Encounter (Signed)
Immunizations are UTD.

## 2023-05-30 ENCOUNTER — Other Ambulatory Visit (HOSPITAL_BASED_OUTPATIENT_CLINIC_OR_DEPARTMENT_OTHER): Payer: Self-pay

## 2023-05-31 ENCOUNTER — Other Ambulatory Visit (HOSPITAL_BASED_OUTPATIENT_CLINIC_OR_DEPARTMENT_OTHER): Payer: Self-pay

## 2023-05-31 ENCOUNTER — Other Ambulatory Visit: Payer: Self-pay

## 2023-05-31 ENCOUNTER — Other Ambulatory Visit (HOSPITAL_COMMUNITY): Payer: Self-pay

## 2023-06-01 ENCOUNTER — Other Ambulatory Visit (HOSPITAL_BASED_OUTPATIENT_CLINIC_OR_DEPARTMENT_OTHER): Payer: Self-pay

## 2023-06-05 ENCOUNTER — Telehealth: Payer: Self-pay | Admitting: Pharmacy Technician

## 2023-06-05 ENCOUNTER — Other Ambulatory Visit (HOSPITAL_COMMUNITY): Payer: Self-pay

## 2023-06-05 NOTE — Telephone Encounter (Signed)
Pharmacy Patient Advocate Encounter   Received notification from CoverMyMeds that prior authorization for nexlizet is required/requested.   Insurance verification completed.   The patient is insured through [facility] ADVANTAGE/RX ADVANCE .   Per test claim: Refill too soon. PA is not needed at this time. Medication was filled 05/31/23. Next eligible fill date is 08/07/23.

## 2023-06-14 DIAGNOSIS — N200 Calculus of kidney: Secondary | ICD-10-CM | POA: Diagnosis not present

## 2023-06-20 ENCOUNTER — Other Ambulatory Visit (HOSPITAL_COMMUNITY): Payer: Self-pay

## 2023-06-23 ENCOUNTER — Encounter (HOSPITAL_COMMUNITY): Payer: Self-pay

## 2023-06-23 ENCOUNTER — Other Ambulatory Visit (HOSPITAL_COMMUNITY): Payer: Self-pay

## 2023-06-26 ENCOUNTER — Other Ambulatory Visit: Payer: Self-pay

## 2023-06-26 ENCOUNTER — Other Ambulatory Visit (HOSPITAL_COMMUNITY): Payer: Self-pay

## 2023-06-26 NOTE — Progress Notes (Signed)
 Specialty Pharmacy Refill Coordination Note  Troy Willis is a 78 y.o. male contacted today regarding refills of specialty medication(s) Pirfenidone    Patient requested Delivery   Delivery date: 06/28/23   Verified address: [address] [place],  [place] [address]   Medication will be filled on 06/27/23.

## 2023-07-06 NOTE — Patient Instructions (Signed)
ICD-10-CM   1. IPF (idiopathic pulmonary fibrosis) (HCC)  J84.112     2. Family history of pulmonary fibrosis  Z83.6     3. Medication monitoring encounter  Z51.81      Esbiret full dose caused insomnia and nightmares but tolrting low dose protocl well Disease seems stable relative to sept 2024 and suspect esbriet is beginning to kick in     Plan  - get rid of down jacket, mattress-or any material that contains bird feather at home - cointinue esbriet at 3 pills two times daily till next visit   - apply sunscreen  - check LFT 05/25/2023 and every month x  6months  =- have with food - do spiro.dlcin 4 months - consider clinical trials as care option in future   - visit [email or web address]  - take leaflet on upcming webinar on trials Followup  - video visti in 6 weeks with APP or Dryden Tapley   - if tolerating esbriet well at low dose can try high dose challenge at that ime  -face to face with Jahyra Sukup in \ After spirometry and DLCO in 4 months;   = symptom score and exercise sit stand test at followu

## 2023-07-06 NOTE — Progress Notes (Unsigned)
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 and nightmares but tolrting low dose protocl well Disease seems stable relative to sept 2024 and suspect esbriet is beginning to kick in     Plan  - get rid of down jacket, mattress-or any material that contains bird feather at home - cointinue esbriet at 3 pills two times daily till next visit   - apply sunscreen  - check LFT 05/25/2023 and every month x  6months  =- have with food - do spiro.dlcin 4 months - consider clinical trials as care option in future   - visit [email or web address]  - take leaflet on upcming webinar on trials Followup  - video visti in 6 weeks with APP or Billee Balcerzak   - if tolerating esbriet well at low dose can try high dose challenge at that  ime  -face to face with Penney Domanski in \ After spirometry and DLCO in 4 months;   = symptom score and exercise sit stand test at followu   FOLLOWUP Return for may 2025 with PFT with Siriyah Ambrosius.    SIGNATURE    Dr. Kalman Shan, M.D., F.C.C.P,  Pulmonary and Critical Care Medicine Staff Physician, [facility] Center Director - Interstitial Lung Disease  Program  Pulmonary Fibrosis [facility] at [facility] [place], [place], [address]  Pager: [phone], If no answer or between  15:00h - 7:00h: call [phone] Telephone: [phone]  2:18 PM 07/07/2023

## 2023-07-07 ENCOUNTER — Other Ambulatory Visit: Payer: PPO

## 2023-07-07 ENCOUNTER — Telehealth (INDEPENDENT_AMBULATORY_CARE_PROVIDER_SITE_OTHER): Payer: PPO | Admitting: Internal Medicine

## 2023-07-07 DIAGNOSIS — Z836 Family history of other diseases of the respiratory system: Secondary | ICD-10-CM

## 2023-07-07 DIAGNOSIS — Z5181 Encounter for therapeutic drug level monitoring: Secondary | ICD-10-CM

## 2023-07-07 DIAGNOSIS — J84112 Idiopathic pulmonary fibrosis: Secondary | ICD-10-CM | POA: Diagnosis not present

## 2023-07-07 LAB — HEPATIC FUNCTION PANEL
ALT: 11 U/L (ref 0–53)
AST: 24 U/L (ref 0–37)
Albumin: 4.4 g/dL (ref 3.5–5.2)
Alkaline Phosphatase: 51 U/L (ref 39–117)
Bilirubin, Direct: 0.1 mg/dL (ref 0.0–0.3)
Total Bilirubin: 0.4 mg/dL (ref 0.2–1.2)
Total Protein: 8.1 g/dL (ref 6.0–8.3)

## 2023-07-15 ENCOUNTER — Encounter: Payer: Self-pay | Admitting: Cardiology

## 2023-07-18 ENCOUNTER — Other Ambulatory Visit: Payer: Self-pay

## 2023-07-21 ENCOUNTER — Other Ambulatory Visit: Payer: Self-pay

## 2023-07-24 ENCOUNTER — Encounter: Payer: Self-pay | Admitting: Hematology & Oncology

## 2023-07-24 ENCOUNTER — Inpatient Hospital Stay: Payer: PPO | Admitting: Hematology & Oncology

## 2023-07-24 ENCOUNTER — Inpatient Hospital Stay: Payer: PPO | Attending: Hematology & Oncology

## 2023-07-24 ENCOUNTER — Other Ambulatory Visit: Payer: Self-pay

## 2023-07-24 VITALS — BP 119/57 | HR 70 | Temp 97.7°F | Resp 18 | Ht 75.0 in | Wt 171.0 lb

## 2023-07-24 DIAGNOSIS — J849 Interstitial pulmonary disease, unspecified: Secondary | ICD-10-CM

## 2023-07-24 DIAGNOSIS — D539 Nutritional anemia, unspecified: Secondary | ICD-10-CM | POA: Diagnosis not present

## 2023-07-24 DIAGNOSIS — D518 Other vitamin B12 deficiency anemias: Secondary | ICD-10-CM

## 2023-07-24 LAB — CBC WITH DIFFERENTIAL (CANCER CENTER ONLY)
Abs Immature Granulocytes: 0.05 10*3/uL (ref 0.00–0.07)
Basophils Absolute: 0.1 10*3/uL (ref 0.0–0.1)
Basophils Relative: 1 %
Eosinophils Absolute: 0.1 10*3/uL (ref 0.0–0.5)
Eosinophils Relative: 2 %
HCT: 34.1 % — ABNORMAL LOW (ref 39.0–52.0)
Hemoglobin: 11.4 g/dL — ABNORMAL LOW (ref 13.0–17.0)
Immature Granulocytes: 1 %
Lymphocytes Relative: 29 %
Lymphs Abs: 1.4 10*3/uL (ref 0.7–4.0)
MCH: 35.2 pg — ABNORMAL HIGH (ref 26.0–34.0)
MCHC: 33.4 g/dL (ref 30.0–36.0)
MCV: 105.2 fL — ABNORMAL HIGH (ref 80.0–100.0)
Monocytes Absolute: 0.5 10*3/uL (ref 0.1–1.0)
Monocytes Relative: 10 %
Neutro Abs: 2.8 10*3/uL (ref 1.7–7.7)
Neutrophils Relative %: 57 %
Platelet Count: 175 10*3/uL (ref 150–400)
RBC: 3.24 MIL/uL — ABNORMAL LOW (ref 4.22–5.81)
RDW: 12.6 % (ref 11.5–15.5)
WBC Count: 4.9 10*3/uL (ref 4.0–10.5)
nRBC: 0 % (ref 0.0–0.2)

## 2023-07-24 LAB — LACTATE DEHYDROGENASE: LDH: 172 U/L (ref 98–192)

## 2023-07-24 LAB — RETICULOCYTES
Immature Retic Fract: 12.6 % (ref 2.3–15.9)
RBC.: 3.22 MIL/uL — ABNORMAL LOW (ref 4.22–5.81)
Retic Count, Absolute: 26.7 10*3/uL (ref 19.0–186.0)
Retic Ct Pct: 0.8 % (ref 0.4–3.1)

## 2023-07-24 LAB — CMP (CANCER CENTER ONLY)
ALT: 13 U/L (ref 0–44)
AST: 24 U/L (ref 15–41)
Albumin: 4.4 g/dL (ref 3.5–5.0)
Alkaline Phosphatase: 44 U/L (ref 38–126)
Anion gap: 5 (ref 5–15)
BUN: 20 mg/dL (ref 8–23)
CO2: 31 mmol/L (ref 22–32)
Calcium: 9.9 mg/dL (ref 8.9–10.3)
Chloride: 103 mmol/L (ref 98–111)
Creatinine: 1 mg/dL (ref 0.61–1.24)
GFR, Estimated: >60 mL/min (ref >60)
Glucose, Bld: 87 mg/dL (ref 70–99)
Potassium: 4.3 mmol/L (ref 3.5–5.1)
Sodium: 139 mmol/L (ref 135–145)
Total Bilirubin: 0.6 mg/dL (ref 0.0–1.2)
Total Protein: 8.1 g/dL (ref 6.5–8.1)

## 2023-07-24 LAB — VITAMIN B12: Vitamin B-12: 1372 pg/mL — ABNORMAL HIGH (ref 180–914)

## 2023-07-24 LAB — SAVE SMEAR(SSMR), FOR PROVIDER SLIDE REVIEW

## 2023-07-24 NOTE — Progress Notes (Signed)
 Hematology and Oncology Follow Up Visit  Troy Willis [id number] 06-Apr-1946 78 y.o. 07/24/2023   Principle Diagnosis:  Macrocytic anemia    Current Therapy:        B12  -- 2500 mcg PO daily   Interim History:  Troy Willis is here today for follow-up.  I just saw him last week.  He was with his wife for her appointment.  He is doing well.  He had a wonderful weekend.  He has had no problems outside that with his pulmonary fibrosis.  He is on medication to try to slow down the process.  We did iron studies on him when we last saw him.  His ferritin was 76 and iron saturation 27%.  His vitamin B12 level was 1455.  He has had no issues with nausea or vomiting.  He has had no change in bowel or bladder habits.  He has had no rashes.  He has had no bleeding.  There has been no leg swelling.  He has had no fever.  Overall, I would say that his performance status is probably ECOG 0.      Medications:  Allergies as of 07/24/2023       Reactions   Amiodarone Other (See Comments)   elevated liver enzymes   Atorvastatin Other (See Comments)   Nocturnal leg cramps   Black Pepper-turmeric Other (See Comments)   heartburn   Metoprolol Other (See Comments)   Hypotension   Prevacid [lansoprazole] Other (See Comments)   Cannot take with plavix        Medication List        Accurate as of July 24, 2023  9:59 AM. If you have any questions, ask your nurse or doctor.          famotidine 20 MG tablet Commonly known as: PEPCID Take 20 mg by mouth daily as needed for heartburn or indigestion.   Flutter Devi Use as directed   furosemide 20 MG tablet Commonly known as: LASIX Take 1 tablet (20 mg total) by mouth daily.   hydrocortisone 2.5 % ointment Apply 1 application  topically 2 (two) times daily as needed (irritation).   Nexlizet 180-10 MG Tabs Generic drug: Bempedoic Acid-Ezetimibe Take 1 tablet by mouth daily.   nitroGLYCERIN 0.4 MG SL tablet Commonly  known as: NITROSTAT Place 1 tablet (0.4 mg total) under the tongue every 5 (five) minutes x 3 doses as needed for chest pain.   Pirfenidone 267 MG Tabs Commonly known as: Esbriet Take 3 tablets (801 mg total) by mouth 3 (three) times daily with meals. What changed: how much to take   triamcinolone cream 0.1 % Commonly known as: KENALOG Apply 1 application  topically daily as needed (rash).   Vitamin D3 50 MCG (2000 UT) Tabs Take 2,000 Units by mouth daily.   Xarelto 20 MG Tabs tablet Generic drug: rivaroxaban Take 1 tablet (20 mg total) by mouth daily with supper.        Allergies:  Allergies  Allergen Reactions   Amiodarone Other (See Comments)    elevated liver enzymes   Atorvastatin Other (See Comments)    Nocturnal leg cramps   Black Pepper-Turmeric Other (See Comments)    heartburn   Metoprolol Other (See Comments)    Hypotension    Prevacid [Lansoprazole] Other (See Comments)    Cannot take with plavix    Past Medical History, Surgical history, Social history, and Family History were reviewed and updated.  Review of Systems: Review  of Systems  Constitutional: Negative.   HENT: Negative.    Eyes: Negative.   Respiratory:  Positive for shortness of breath.   Cardiovascular: Negative.   Gastrointestinal: Negative.   Genitourinary: Negative.   Musculoskeletal: Negative.   Skin: Negative.   Neurological: Negative.   Endo/Heme/Allergies: Negative.   Psychiatric/Behavioral: Negative.       Physical Exam:  height is 6\' 3"  (1.905 m) and weight is 171 lb (77.6 kg). His oral temperature is 97.7 F (36.5 C). His blood pressure is 119/57 (abnormal) and his pulse is 70. His respiration is 18 and oxygen saturation is 98%.   Wt Readings from Last 3 Encounters:  07/24/23 171 lb (77.6 kg)  05/25/23 170 lb 9.6 oz (77.4 kg)  05/04/23 168 lb (76.2 kg)    Physical Exam Vitals reviewed.  HENT:     Head: Normocephalic and atraumatic.  Eyes:     Pupils: Pupils  are equal, round, and reactive to light.  Cardiovascular:     Rate and Rhythm: Normal rate and regular rhythm.     Heart sounds: Normal heart sounds.     Comments: Cardiac exam is regular rate and rhythm consistent with atrial fibrillation.  He has no murmurs, rubs or bruits. Pulmonary:     Effort: Pulmonary effort is normal.     Breath sounds: Normal breath sounds.  Abdominal:     General: Bowel sounds are normal.     Palpations: Abdomen is soft.  Musculoskeletal:        General: No tenderness or deformity. Normal range of motion.     Cervical back: Normal range of motion.  Lymphadenopathy:     Cervical: No cervical adenopathy.  Skin:    General: Skin is warm and dry.     Findings: No erythema or rash.  Neurological:     Mental Status: He is alert and oriented to person, place, and time.  Psychiatric:        Behavior: Behavior normal.        Thought Content: Thought content normal.        Judgment: Judgment normal.      Lab Results  Component Value Date   WBC 4.9 07/24/2023   HGB 11.4 (L) 07/24/2023   HCT 34.1 (L) 07/24/2023   MCV 105.2 (H) 07/24/2023   PLT 175 07/24/2023   Lab Results  Component Value Date   FERRITIN 76 05/04/2023   IRON 88 05/04/2023   TIBC 330 05/04/2023   UIBC 242 05/04/2023   IRONPCTSAT 27 05/04/2023   Lab Results  Component Value Date   RETICCTPCT 0.8 07/24/2023   RBC 3.22 (L) 07/24/2023   RBC 3.24 (L) 07/24/2023   No results found for: "KPAFRELGTCHN", "LAMBDASER", "KAPLAMBRATIO" No results found for: "IGGSERUM", "IGA", "IGMSERUM" No results found for: "TOTALPROTELP", "ALBUMINELP", "A1GS", "A2GS", "BETS", "BETA2SER", "GAMS", "MSPIKE", "SPEI"   Chemistry      Component Value Date/Time   NA 139 07/24/2023 0845   NA 139 07/12/2021 0821   K 4.3 07/24/2023 0845   CL 103 07/24/2023 0845   CO2 31 07/24/2023 0845   BUN 20 07/24/2023 0845   BUN 19 07/12/2021 0821   CREATININE 1.00 07/24/2023 0845   CREATININE 0.86 04/01/2016 0829       Component Value Date/Time   CALCIUM 9.9 07/24/2023 0845   ALKPHOS 44 07/24/2023 0845   AST 24 07/24/2023 0845   ALT 13 07/24/2023 0845   BILITOT 0.6 07/24/2023 0845       Impression and Plan:  Troy Willis is a very pleasant 78 yo caucasian gentleman with macrocytic anemia.  Everything is holding pretty steady.  I did look at his blood smear under the microscope.  Again everything looked okay.  I do not see any nucleated red blood cells.  He had no inclusion bodies.  White blood cells appeared mature.  I do not see any hypersegmented polys.  For right now, we will just follow along.  He is totally asymptomatic.  He does have the high MCV.  I do believe this is probably from medications.  It might be from the medication that he takes for his pulmonary fibrosis.  I would like to see him back in about 4 months now.  I think this would be very reasonable.    I really do not see a need for any bone marrow biopsy at this point.   Josph Macho, MD 3/10/20259:59 AM

## 2023-07-27 ENCOUNTER — Encounter: Payer: Self-pay | Admitting: Family Medicine

## 2023-07-31 ENCOUNTER — Other Ambulatory Visit: Payer: Self-pay

## 2023-08-02 ENCOUNTER — Other Ambulatory Visit: Payer: Self-pay

## 2023-08-02 NOTE — Progress Notes (Signed)
 Clinical Intervention Note  Clinical Intervention Notes: Patient was contacted for monthly refill call and states that he has been experiencing multiple side effects including diarrhea, insomnia, nightmares, more negative mood, non active during the day, and feeling pessimistic which is out of character for him. Patient has self held doses Pirfenidone as of this past Saturday 3/15 to attempt to see if he could begin feeling better. He states that his symptoms have been lessening. Discussed the importance of involving his provider in this conversation by the end of the week to guide his continued care. He will reach out to the office. Clinical Pharmacist [facility] has been informed of the above.   Clinical Intervention Outcomes: Improved therapy adherence; Prevention of an adverse drug event   Otto Herb Specialty Pharmacist

## 2023-08-03 ENCOUNTER — Ambulatory Visit (HOSPITAL_COMMUNITY): Payer: PPO | Attending: Cardiovascular Disease

## 2023-08-03 ENCOUNTER — Encounter: Payer: Self-pay | Admitting: Cardiology

## 2023-08-03 DIAGNOSIS — I080 Rheumatic disorders of both mitral and aortic valves: Secondary | ICD-10-CM | POA: Diagnosis not present

## 2023-08-03 LAB — ECHOCARDIOGRAM COMPLETE
Area-P 1/2: 6.32 cm2
MV M vel: 4.76 m/s
MV Peak grad: 90.6 mmHg
Radius: 0.7 cm
S' Lateral: 2.8 cm

## 2023-08-04 ENCOUNTER — Encounter: Payer: Self-pay | Admitting: Family Medicine

## 2023-08-04 ENCOUNTER — Encounter: Payer: Self-pay | Admitting: Internal Medicine

## 2023-08-04 ENCOUNTER — Telehealth: Payer: Self-pay

## 2023-08-04 DIAGNOSIS — I080 Rheumatic disorders of both mitral and aortic valves: Secondary | ICD-10-CM

## 2023-08-04 DIAGNOSIS — I071 Rheumatic tricuspid insufficiency: Secondary | ICD-10-CM

## 2023-08-04 DIAGNOSIS — I272 Pulmonary hypertension, unspecified: Secondary | ICD-10-CM

## 2023-08-04 NOTE — Telephone Encounter (Signed)
 The patient has been notified of the result and verbalized understanding.  All questions (if any) were answered. An echocardiogram was ordered for a year from today. Erick Alley, RN 08/04/2023 4:25 PM

## 2023-08-04 NOTE — Telephone Encounter (Signed)
-----   Message from Nurse Mearl Latin sent at 08/04/2023 11:35 AM EDT -----  ----- Message ----- From: Quintella Reichert, MD Sent: 08/03/2023  10:26 PM EDT To: [facility] Triage  Echo showed normal pumping function of the heart muscle with mildly thickened heart muscle called LVH. The upper chambers of the heart are very enlarged related to his afib.  There is mild leakiness of the MV (was moderate on last echo) and moderate to severe leakiness of the TV.  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  Repeat echo in 1 year for PHTN, MR and TR.

## 2023-08-07 NOTE — Telephone Encounter (Signed)
 MR- please advise on pt email:  Troy Willis, Troy "[name]"  [facility] Phone Number: [phone]   08-04-23 Note Re Esbriet Hello Dr. Marchelle Gearing, Since beginning to take Esbriet in December of 2024, I have begun to have, and continue to have, loose and erratic bowel movements, insomnia, and nightmares; and find my mental state becoming increasingly negative, pessimistic, gloomy, and cynical; and have little or no interest in exercising or spending time with friends.   This is not physically or mentally typical for me.  As a test, I have not taken my normal dose of Esbriet (3 pills with breakfast and 3 pills with lunch) from Saturday 07/29/23 through today, Friday 08/04/23.   Over the last several days my digestive tract seems to be much more normal, my mental state is much improved, and I am feeling much more like my usual optimistic problem-solving self.   In summary, I am feeling a lot better now than I have felt since I began taking Esbriet.  My goal is to live well as long as I can.  Dealing daily with the physical and mental symptoms described above in order to live longer is not consistent with my goal of living well.  At this point, I am very reluctant to resume the Esbriet and would like to discuss this with you. Thank you, [name] [phone]

## 2023-08-08 NOTE — Telephone Encounter (Signed)
 Yes youyr adverse effcts are likely related to esbriet  Plan  Do not restart esbriet Will mark as allergy Wioll see you 09/14/23 to discuss alternative plan    SIGNATURE    Dr. Kalman Shan, M.D., F.C.C.P,  Pulmonary and Critical Care Medicine Staff Physician, [facility] Center Director - Interstitial Lung Disease  Program  Pulmonary Fibrosis [facility] Network at [facility] [place], [place], [address]   Pager: [phone], If no answer  -> Check AMION or Try [phone] Telephone (clinical office): [phone] Telephone (research): [phone]  6:21 PM 08/08/2023

## 2023-08-09 ENCOUNTER — Other Ambulatory Visit (HOSPITAL_BASED_OUTPATIENT_CLINIC_OR_DEPARTMENT_OTHER): Payer: Self-pay

## 2023-08-09 DIAGNOSIS — L853 Xerosis cutis: Secondary | ICD-10-CM | POA: Diagnosis not present

## 2023-08-09 DIAGNOSIS — L821 Other seborrheic keratosis: Secondary | ICD-10-CM | POA: Diagnosis not present

## 2023-08-09 DIAGNOSIS — D1801 Hemangioma of skin and subcutaneous tissue: Secondary | ICD-10-CM | POA: Diagnosis not present

## 2023-08-09 DIAGNOSIS — L720 Epidermal cyst: Secondary | ICD-10-CM | POA: Diagnosis not present

## 2023-08-09 DIAGNOSIS — L814 Other melanin hyperpigmentation: Secondary | ICD-10-CM | POA: Diagnosis not present

## 2023-08-09 DIAGNOSIS — I8392 Asymptomatic varicose veins of left lower extremity: Secondary | ICD-10-CM | POA: Diagnosis not present

## 2023-08-09 DIAGNOSIS — Z85828 Personal history of other malignant neoplasm of skin: Secondary | ICD-10-CM | POA: Diagnosis not present

## 2023-08-09 DIAGNOSIS — L570 Actinic keratosis: Secondary | ICD-10-CM | POA: Diagnosis not present

## 2023-08-09 MED ORDER — FLUOROURACIL 5 % EX CREA
TOPICAL_CREAM | CUTANEOUS | 1 refills | Status: AC
  Filled 2023-08-09: qty 40, 30d supply, fill #0

## 2023-08-10 ENCOUNTER — Telehealth: Payer: Self-pay | Admitting: *Deleted

## 2023-08-10 ENCOUNTER — Ambulatory Visit: Payer: PPO | Attending: Cardiology | Admitting: Cardiology

## 2023-08-10 ENCOUNTER — Encounter: Payer: Self-pay | Admitting: Cardiology

## 2023-08-10 VITALS — BP 134/70 | HR 53 | Ht 75.0 in | Wt 167.0 lb

## 2023-08-10 DIAGNOSIS — I071 Rheumatic tricuspid insufficiency: Secondary | ICD-10-CM

## 2023-08-10 DIAGNOSIS — I272 Pulmonary hypertension, unspecified: Secondary | ICD-10-CM

## 2023-08-10 DIAGNOSIS — I25118 Atherosclerotic heart disease of native coronary artery with other forms of angina pectoris: Secondary | ICD-10-CM | POA: Diagnosis not present

## 2023-08-10 DIAGNOSIS — I4821 Permanent atrial fibrillation: Secondary | ICD-10-CM | POA: Diagnosis not present

## 2023-08-10 DIAGNOSIS — E7800 Pure hypercholesterolemia, unspecified: Secondary | ICD-10-CM

## 2023-08-10 DIAGNOSIS — I080 Rheumatic disorders of both mitral and aortic valves: Secondary | ICD-10-CM
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
## 2023-08-10 NOTE — Telephone Encounter (Signed)
 Per Dr Mayford Knife, need download ASAp   He gets his supplies from [facility]. He will bring his card in for a download.

## 2023-08-10 NOTE — Patient Instructions (Signed)
 Medication Instructions:  Your physician recommends that you continue on your current medications as directed. Please refer to the Current Medication list given to you today.  *If you need a refill on your cardiac medications before your next appointment, please call your pharmacy*  Testing/Procedures: Echocardiogram - March 2026 Your physician has requested that you have an echocardiogram. Echocardiography is a painless test that uses sound waves to create images of your heart. It provides your doctor with information about the size and shape of your heart and how well your heart's chambers and valves are working. This procedure takes approximately one hour. There are no restrictions for this procedure. Please do NOT wear cologne, perfume, aftershave, or lotions (deodorant is allowed). Please arrive 15 minutes prior to your appointment time.  Follow-Up: At [facility], you and your health needs are our priority.  As part of our continuing mission to provide you with exceptional heart care, our providers are all part of one team.  This team includes your primary Cardiologist (physician) and Advanced Practice Providers or APPs (Physician Assistants and Nurse Practitioners) who all work together to provide you with the care you need, when you need it.  Your next appointment:   1 year(s)  Provider:   Armanda Magic, MD      Other Instructions    1st Floor: - Lobby - Registration  - Pharmacy  - Lab - Cafe  2nd Floor: - PV Lab - Diagnostic Testing (echo, CT, nuclear med)  3rd Floor: - Vacant  4th Floor: - TCTS (cardiothoracic surgery) - AFib Clinic - Structural Heart Clinic - Vascular Surgery  - Vascular Ultrasound  5th Floor: - HeartCare Cardiology (general and EP) - Clinical Pharmacy for coumadin, hypertension, lipid, weight-loss medications, and med management appointments    Valet parking services will be available as well.

## 2023-08-15 ENCOUNTER — Other Ambulatory Visit (HOSPITAL_COMMUNITY): Payer: Self-pay

## 2023-08-26 ENCOUNTER — Other Ambulatory Visit: Payer: Self-pay | Admitting: Cardiology

## 2023-08-28 ENCOUNTER — Other Ambulatory Visit (HOSPITAL_BASED_OUTPATIENT_CLINIC_OR_DEPARTMENT_OTHER): Payer: Self-pay

## 2023-08-28 ENCOUNTER — Other Ambulatory Visit: Payer: Self-pay

## 2023-08-29 ENCOUNTER — Other Ambulatory Visit (HOSPITAL_BASED_OUTPATIENT_CLINIC_OR_DEPARTMENT_OTHER): Payer: Self-pay

## 2023-08-29 MED ORDER — NEXLIZET 180-10 MG PO TABS
1.0000 | ORAL_TABLET | Freq: Every day | ORAL | 3 refills | Status: AC
  Filled 2023-08-29: qty 90, 90d supply, fill #0
  Filled 2023-11-23: qty 90, 90d supply, fill #1
  Filled 2024-02-27: qty 90, 90d supply, fill #2
  Filled 2024-05-26: qty 90, 90d supply, fill #3

## 2023-09-01 ENCOUNTER — Other Ambulatory Visit: Payer: Self-pay | Admitting: Cardiology

## 2023-09-01 DIAGNOSIS — I4821 Permanent atrial fibrillation: Secondary | ICD-10-CM

## 2023-09-04 ENCOUNTER — Other Ambulatory Visit (HOSPITAL_BASED_OUTPATIENT_CLINIC_OR_DEPARTMENT_OTHER): Payer: Self-pay

## 2023-09-04 MED ORDER — RIVAROXABAN 20 MG PO TABS
20.0000 mg | ORAL_TABLET | Freq: Every day | ORAL | 1 refills | Status: DC
  Filled 2023-09-04: qty 90, 90d supply, fill #0
  Filled 2023-11-23: qty 90, 90d supply, fill #1

## 2023-09-04 NOTE — Telephone Encounter (Signed)
 Prescription refill request for Xarelto  received.  Indication:afib Last office visit:3/25 Weight:75.8  kg Age:78 Scr:1.00  3/25 CrCl:65.27  ml/min  Prescription refilled

## 2023-09-12 ENCOUNTER — Other Ambulatory Visit (HOSPITAL_COMMUNITY): Payer: Self-pay

## 2023-09-12 ENCOUNTER — Other Ambulatory Visit: Payer: Self-pay

## 2023-09-14 ENCOUNTER — Ambulatory Visit: Payer: PPO | Admitting: Internal Medicine

## 2023-09-14 ENCOUNTER — Encounter: Payer: Self-pay | Admitting: Internal Medicine

## 2023-09-14 VITALS — BP 103/61 | HR 67 | Ht 74.0 in | Wt 168.0 lb

## 2023-09-14 DIAGNOSIS — Z5181 Encounter for therapeutic drug level monitoring: Secondary | ICD-10-CM

## 2023-09-14 DIAGNOSIS — Z836 Family history of other diseases of the respiratory system: Secondary | ICD-10-CM

## 2023-09-14 DIAGNOSIS — J84112 Idiopathic pulmonary fibrosis: Secondary | ICD-10-CM | POA: Diagnosis not present

## 2023-09-14 DIAGNOSIS — I272 Pulmonary hypertension, unspecified: Secondary | ICD-10-CM

## 2023-09-14 DIAGNOSIS — I288 Other diseases of pulmonary vessels: Secondary | ICD-10-CM | POA: Diagnosis not present

## 2023-09-14 LAB — PULMONARY FUNCTION TEST
DL/VA % pred: 77 %
DL/VA: 2.98 ml/min/mmHg/L
DLCO cor % pred: 51 %
DLCO cor: 14.2 ml/min/mmHg
DLCO unc % pred: 51 %
DLCO unc: 14.2 ml/min/mmHg
FEF 25-75 Pre: 1.82 L/s
FEF2575-%Pred-Pre: 74 %
FEV1-%Pred-Pre: 68 %
FEV1-Pre: 2.38 L
FEV1FVC-%Pred-Pre: 104 %
FEV6-%Pred-Pre: 69 %
FEV6-Pre: 3.16 L
FEV6FVC-%Pred-Pre: 106 %
FVC-%Pred-Pre: 65 %
FVC-Pre: 3.16 L
Pre FEV1/FVC ratio: 75 %
Pre FEV6/FVC Ratio: 100 %

## 2023-09-14 NOTE — Patient Instructions (Addendum)
 ICD-10-CM   1. IPF (idiopathic pulmonary fibrosis) (HCC)  J84.112 CT Chest High Resolution    2. Family history of pulmonary fibrosis  Z83.6 CT Chest High Resolution    3. 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 Medication monitoring encounter  Z51.81        Esbriet  full dose caused significant GI and mood side effects, stopped Esbriet  April 2025 LFT stable and normal in March 2025 Disease seems stable in the short term with slightly improved DLCO compared to 05/25/2023 but slightly worse from November 2023 Also there is evidence of pulmonary hypertension on recent echo Last HRCT May 2024  Plan  - get rid of down jacket, mattress-or any material that contains bird feather at home - repeat , HRCT in 2 monnths - Continue off Esbriet  and we discussed Ofev today as alternative treatment  - you will reflect on this - consider right heart catheterization for pulm HTN with evidence of worsening IPF on PFTs  - you wil reflect on this   Follow up  -face to face with Eron Goble  HRCT in 8 weeks; 30 minute visit   = symptom score and exercise sit stand test at follow up  - discuss ofev and Right Heart Cath at followup

## 2023-09-14 NOTE — Progress Notes (Signed)
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    Family History  Problem Relation Age of Onset   Cancer Mother        oral   COPD Father    Heart attack Father    Arthritis Sister    Colon cancer Sister 85   Arthritis Sister    Heart disease Sister    Pulmonary fibrosis Sister    Arthritis Sister    Pulmonary fibrosis Sister    Heart attack Brother        died from MI at age 86, sister CABG 08/2015, brother CABG in his 26's.    Heart disease Brother    Coronary artery disease Other    Suicidality Other    Ulcers Neg Hx    Esophageal cancer Neg Hx    Pancreatic cancer Neg Hx    Stomach cancer Neg Hx      Current Outpatient Medications:    Bempedoic Acid -Ezetimibe  (NEXLIZET ) 180-10 MG TABS, Take 1  tablet by mouth daily., Disp: 90 tablet, Rfl: 3   famotidine (PEPCID) 20 MG tablet, Take 20 mg by mouth daily as needed for heartburn or indigestion., Disp: , Rfl:    fluorouracil  (EFUDEX ) 5 % cream, Apply 1 a small amount to skin twice a day For 2 weeks, Disp: 40 g, Rfl: 1   furosemide  (LASIX ) 20 MG tablet, Take 1 tablet (20 mg total) by mouth daily., Disp: 90 tablet, Rfl: 1   nitroGLYCERIN  (NITROSTAT ) 0.4 MG SL tablet, Place 1 tablet (0.4 mg total) under the tongue every 5 (five) minutes x 3 doses as needed for chest pain., Disp: 25 tablet, Rfl: 3   rivaroxaban  (XARELTO ) 20 MG TABS tablet, Take 1 tablet (20 mg total) by mouth daily with supper., Disp: 90 tablet, Rfl: 1 No current facility-administered medications for this visit.  Facility-Administered Medications Ordered in Other Visits:    gemcitabine  (GEMZAR ) chemo syringe for bladder instillation 2,000 mg, 2,000 mg, Bladder Instillation, Once, Homero Luster, MD      Objective:   There were no vitals filed for this visit.  Estimated body mass index is 21.62 kg/m as calculated from the following:   Height as of an earlier encounter on 09/14/23: 6\' 2"  (1.88  m).   Weight as of an earlier encounter on 09/14/23: 168 lb 6.4 oz (76.4 kg).  @WEIGHTCHANGE @  There were no vitals filed for this visit.   Physical Exam   General: No distress. [place] same O2 at rest: no Cane present: no Sitting in wheel chair: no Frail: no Obese: no Neuro: Alert and Oriented x 3. GCS 15. Speech normal Psych: Pleasant Resp: , No overt respiratory distress CVS: Normal heart sounds. Murmurs - bi Ext: Stigmata of Connective Tissue Disease - bi HEENT: Normal upper airway. PEERL +. No post nasal drip        Assessment:       ICD-10-CM   1. IPF (idiopathic pulmonary fibrosis) (HCC)  J84.112 CT Chest High Resolution    2. Family history of pulmonary fibrosis  Z83.6 CT Chest High Resolution    3. Pulmonary HTN (HCC)  I27.20 CT Chest High Resolution     4. Enlarged pulmonary artery (HCC)  I28.8 CT Chest High Resolution    5. Medication monitoring encounter  Z51.81          Plan:     Patient Instructions     ICD-10-CM   1. IPF (idiopathic pulmonary fibrosis) (HCC)  J84.112 CT Chest High Resolution    2. Family history of pulmonary fibrosis  Z83.6 CT Chest High Resolution    3. 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 Medication monitoring encounter  Z51.81        Esbriet  full dose caused significant GI and mood side effects, stopped Esbriet  April 2025 LFT stable and normal in March 2025 Disease seems stable in the short term with slightly improved DLCO compared to 05/25/2023 but slightly worse from November 2023 Also there is evidence of pulmonary hypertension on recent echo Last HRCT May 2024  Plan  - get rid of down jacket, mattress-or any material that contains bird feather at home - repeat , HRCT in 2 monnths - Continue off Esbriet  and we discussed Ofev today as alternative treatment  - you will reflect on this - consider right heart catheterization for pulm HTN with evidence of worsening IPF on PFTs  - you wil reflect on this   Follow up  -face to face with Ramaswamy  HRCT in 8 weeks; 30 minute visit   = symptom score and exercise sit stand test at follow up  - discuss ofev and Right Heart Cath at followup   FOLLOWUP Return in about 8 weeks (around 11/09/2023) for with Dr Bertrum Brodie, Face to Face Visit, after Spiro and DLCO, 30 min visit, ILD.  Seen with the resident Dr. Cleatrice Curl.Aaron Aas  Specific portions of mind.  SIGNATURE   Dr. Maire Scot, M.D., F.C.C.P,  Pulmonary and Critical Care Medicine Staff Physician, [facility] Center Director - Interstitial Lung Disease  Program  Pulmonary Fibrosis [facility] Network at [facility] [place], [place], [address]  Pager: [phone], If no answer  or between  15:00h - 7:00h: call [phone] Telephone: [phone]  2:23 PM 09/14/2023

## 2023-09-14 NOTE — Progress Notes (Signed)
 Spirometry/DLCO performed today.

## 2023-09-14 NOTE — Patient Instructions (Addendum)
  Spirometry/DCO performed today.

## 2023-09-15 ENCOUNTER — Encounter: Payer: Self-pay | Admitting: Family Medicine

## 2023-09-15 DIAGNOSIS — J84112 Idiopathic pulmonary fibrosis: Secondary | ICD-10-CM

## 2023-09-19 ENCOUNTER — Other Ambulatory Visit (HOSPITAL_BASED_OUTPATIENT_CLINIC_OR_DEPARTMENT_OTHER): Payer: Self-pay

## 2023-09-19 ENCOUNTER — Ambulatory Visit: Admitting: Podiatry

## 2023-09-19 DIAGNOSIS — D2372 Other benign neoplasm of skin of left lower limb, including hip: Secondary | ICD-10-CM | POA: Diagnosis not present

## 2023-09-19 DIAGNOSIS — M216X2 Other acquired deformities of left foot: Secondary | ICD-10-CM
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
## 2023-09-19 NOTE — Telephone Encounter (Signed)
 Referral faxed to [phone] as requested.

## 2023-09-19 NOTE — Patient Instructions (Signed)
 Keep the bandage on for 24 hours. At that time, remove and clean with soap and water. If it hurts or burns before 24 hours go ahead and remove the bandage and wash with soap and water. Keep the area clean. If there is any blistering cover with antibiotic ointment and a bandage. Monitor for any redness, drainage, or other signs of infection. Call the office if any are to occur. If you have any questions, please call the office at [phone].

## 2023-09-21 DIAGNOSIS — B880 Other acariasis: Secondary | ICD-10-CM | POA: Diagnosis not present

## 2023-09-21 DIAGNOSIS — H40013 Open angle with borderline findings, low risk, bilateral: Secondary | ICD-10-CM | POA: Diagnosis not present

## 2023-09-21 DIAGNOSIS — H26492 Other secondary cataract, left eye: Secondary | ICD-10-CM | POA: Diagnosis not present

## 2023-09-21 DIAGNOSIS — H524 Presbyopia: Secondary | ICD-10-CM | POA: Diagnosis not present

## 2023-09-21 DIAGNOSIS — H0100A Unspecified blepharitis right eye, upper and lower eyelids: Secondary | ICD-10-CM | POA: Diagnosis not present

## 2023-10-04 ENCOUNTER — Ambulatory Visit (HOSPITAL_BASED_OUTPATIENT_CLINIC_OR_DEPARTMENT_OTHER)
Admission: RE | Admit: 2023-10-04 | Discharge: 2023-10-04 | Disposition: A | Discharge status: Home / Self Care | Source: Ambulatory Visit | Attending: Internal Medicine | Admitting: Internal Medicine

## 2023-10-04 DIAGNOSIS — I288 Other diseases of pulmonary vessels: Secondary | ICD-10-CM | POA: Insufficient documentation

## 2023-10-04 DIAGNOSIS — Z836 Family history of other diseases of the respiratory system: Secondary | ICD-10-CM | POA: Diagnosis not present

## 2023-10-04 DIAGNOSIS — I272 Pulmonary hypertension, unspecified: Secondary | ICD-10-CM | POA: Diagnosis not present

## 2023-10-04 DIAGNOSIS — J84112 Idiopathic pulmonary fibrosis: Secondary | ICD-10-CM | POA: Diagnosis not present

## 2023-10-04 DIAGNOSIS — J849 Interstitial pulmonary disease, unspecified: Secondary | ICD-10-CM | POA: Diagnosis not present

## 2023-10-04 DIAGNOSIS — I700 Atherosclerosis of aorta: Secondary | ICD-10-CM | POA: Diagnosis not present

## 2023-10-05 ENCOUNTER — Other Ambulatory Visit (HOSPITAL_COMMUNITY): Payer: Self-pay

## 2023-10-11 ENCOUNTER — Ambulatory Visit: Payer: Self-pay | Admitting: Internal Medicine

## 2023-10-16 ENCOUNTER — Other Ambulatory Visit: Payer: Self-pay

## 2023-10-16 NOTE — Progress Notes (Signed)
 Patient did not tolerate pirfenidone  (reported on 3/21 erratic bowel movements, insomnia, nightmares, and negative mental state). Dr. Kandi Oris approved for patient to hold pirfenidone  starting 08/04/23 with the plan to discuss reinitiating therapy on 09/14/23. At visit on 5/1, patient was not yet interested in starting pirfenidone . Spoke to Devki today and per Dr. Kandi Oris, ok to continue off Esbriet  and discussing other treatment options, ok to discontinue perfenidone follow up. Disenrolled.

## 2023-11-03 ENCOUNTER — Other Ambulatory Visit (HOSPITAL_BASED_OUTPATIENT_CLINIC_OR_DEPARTMENT_OTHER): Payer: Self-pay

## 2023-11-03 DIAGNOSIS — I2721 Secondary pulmonary arterial hypertension: Secondary | ICD-10-CM | POA: Diagnosis not present

## 2023-11-03 DIAGNOSIS — J479 Bronchiectasis, uncomplicated: Secondary | ICD-10-CM | POA: Diagnosis not present

## 2023-11-03 DIAGNOSIS — I4821 Permanent atrial fibrillation: Secondary | ICD-10-CM | POA: Diagnosis not present

## 2023-11-03 DIAGNOSIS — I2510 Atherosclerotic heart disease of native coronary artery without angina pectoris: Secondary | ICD-10-CM | POA: Diagnosis not present

## 2023-11-03 DIAGNOSIS — J849 Interstitial pulmonary disease, unspecified: Secondary | ICD-10-CM | POA: Diagnosis not present

## 2023-11-03 DIAGNOSIS — G4733 Obstructive sleep apnea (adult) (pediatric): Secondary | ICD-10-CM | POA: Diagnosis not present

## 2023-11-03 DIAGNOSIS — K766 Portal hypertension: Secondary | ICD-10-CM | POA: Diagnosis not present

## 2023-11-03 MED ORDER — LEVOFLOXACIN 500 MG PO TABS
500.0000 mg | ORAL_TABLET | Freq: Every day | ORAL | 0 refills | Status: DC
  Filled 2023-11-03: qty 5, 5d supply, fill #0

## 2023-11-04 ENCOUNTER — Encounter: Payer: Self-pay | Admitting: Hematology & Oncology

## 2023-11-06 ENCOUNTER — Other Ambulatory Visit (HOSPITAL_BASED_OUTPATIENT_CLINIC_OR_DEPARTMENT_OTHER): Payer: Self-pay

## 2023-11-07 ENCOUNTER — Encounter: Payer: Self-pay | Admitting: Hematology & Oncology

## 2023-11-27 ENCOUNTER — Inpatient Hospital Stay: Attending: Hematology & Oncology

## 2023-11-27 ENCOUNTER — Other Ambulatory Visit (HOSPITAL_BASED_OUTPATIENT_CLINIC_OR_DEPARTMENT_OTHER): Payer: Self-pay

## 2023-11-27 ENCOUNTER — Inpatient Hospital Stay: Admitting: Family

## 2023-11-27 VITALS — BP 104/62 | HR 76 | Temp 98.0°F | Resp 19 | Ht 75.0 in | Wt 166.8 lb

## 2023-11-27 DIAGNOSIS — D539 Nutritional anemia, unspecified: Secondary | ICD-10-CM | POA: Insufficient documentation

## 2023-11-27 DIAGNOSIS — D518 Other vitamin B12 deficiency anemias: Secondary | ICD-10-CM | POA: Diagnosis not present

## 2023-11-27 DIAGNOSIS — D649 Anemia, unspecified: Secondary | ICD-10-CM | POA: Diagnosis not present

## 2023-11-27 DIAGNOSIS — J849 Interstitial pulmonary disease, unspecified: Secondary | ICD-10-CM

## 2023-11-27 LAB — CMP (CANCER CENTER ONLY)
ALT: 15 U/L (ref 0–44)
AST: 32 U/L (ref 15–41)
Albumin: 4.3 g/dL (ref 3.5–5.0)
Alkaline Phosphatase: 53 U/L (ref 38–126)
Anion gap: 9 (ref 5–15)
BUN: 21 mg/dL (ref 8–23)
CO2: 27 mmol/L (ref 22–32)
Calcium: 9.9 mg/dL (ref 8.9–10.3)
Chloride: 103 mmol/L (ref 98–111)
Creatinine: 0.92 mg/dL (ref 0.61–1.24)
GFR, Estimated: >60 mL/min (ref >60)
Glucose, Bld: 91 mg/dL (ref 70–99)
Potassium: 4.8 mmol/L (ref 3.5–5.1)
Sodium: 139 mmol/L (ref 135–145)
Total Bilirubin: 0.6 mg/dL (ref 0.0–1.2)
Total Protein: 8.2 g/dL — ABNORMAL HIGH (ref 6.5–8.1)

## 2023-11-27 LAB — CBC WITH DIFFERENTIAL (CANCER CENTER ONLY)
Abs Immature Granulocytes: 0.01 K/uL (ref 0.00–0.07)
Basophils Absolute: 0.1 K/uL (ref 0.0–0.1)
Basophils Relative: 1 %
Eosinophils Absolute: 0.2 K/uL (ref 0.0–0.5)
Eosinophils Relative: 4 %
HCT: 34.1 % — ABNORMAL LOW (ref 39.0–52.0)
Hemoglobin: 11.5 g/dL — ABNORMAL LOW (ref 13.0–17.0)
Immature Granulocytes: 0 %
Lymphocytes Relative: 25 %
Lymphs Abs: 1.4 K/uL (ref 0.7–4.0)
MCH: 35 pg — ABNORMAL HIGH (ref 26.0–34.0)
MCHC: 33.7 g/dL (ref 30.0–36.0)
MCV: 103.6 fL — ABNORMAL HIGH (ref 80.0–100.0)
Monocytes Absolute: 0.6 K/uL (ref 0.1–1.0)
Monocytes Relative: 11 %
Neutro Abs: 3.2 K/uL (ref 1.7–7.7)
Neutrophils Relative %: 59 %
Platelet Count: 211 K/uL (ref 150–400)
RBC: 3.29 MIL/uL — ABNORMAL LOW (ref 4.22–5.81)
RDW: 12.9 % (ref 11.5–15.5)
WBC Count: 5.6 K/uL (ref 4.0–10.5)
nRBC: 0 % (ref 0.0–0.2)

## 2023-11-27 LAB — RETICULOCYTES
Immature Retic Fract: 7.8 % (ref 2.3–15.9)
RBC.: 3.29 MIL/uL — ABNORMAL LOW (ref 4.22–5.81)
Retic Count, Absolute: 27.6 K/uL (ref 19.0–186.0)
Retic Ct Pct: 0.8 % (ref 0.4–3.1)

## 2023-11-27 LAB — SAVE SMEAR(SSMR), FOR PROVIDER SLIDE REVIEW

## 2023-11-27 LAB — LACTATE DEHYDROGENASE: LDH: 175 U/L (ref 98–192)

## 2023-11-27 LAB — VITAMIN B12: Vitamin B-12: 1977 pg/mL — ABNORMAL HIGH (ref 180–914)

## 2023-11-27 NOTE — Progress Notes (Signed)
 Hematology and Oncology Follow Up Visit  Troy Willis [id number] 25-Nov-1945 78 y.o. 11/27/2023   Principle Diagnosis:  Macrocytic anemia    Current Therapy:        B12  -- 2500 mcg PO daily   Interim History: Troy Willis is here today for follow-up. He is doing well overall and has no complaints at this time.  He is staying busy helping care for his sweet wife.  He notes fatigue on long days.  No issue with infections. No fever, chills, n/v, cough, rash, dizziness, SOB, chest pain, palpitations, abdominal pain or changes in bowel or bladder habits.  No swelling, tenderness, numbness or tingling in his extremities at this time.  No falls or syncope.  Appetite and hydration are good. Weight is stable at 166 lbs.   ECOG Performance Status: 0 - Asymptomatic  Medications:  Allergies as of 11/27/2023       Reactions   Amiodarone Other (See Comments)   elevated liver enzymes   Atorvastatin  Other (See Comments)   Nocturnal leg cramps   Black Pepper-turmeric Other (See Comments)   heartburn   Metoprolol  Other (See Comments)   Hypotension   Pirfenidone     Mood changes, GI issues   Prevacid [lansoprazole] Other (See Comments)   Cannot take with plavix         Medication List        Accurate as of November 27, 2023  9:13 AM. If you have any questions, ask your nurse or doctor.          famotidine 20 MG tablet Commonly known as: PEPCID Take 20 mg by mouth daily as needed for heartburn or indigestion.   fluorouracil  5 % cream Commonly known as: EFUDEX  Apply 1 a small amount to skin twice a day For 2 weeks   furosemide  20 MG tablet Commonly known as: LASIX  Take 1 tablet (20 mg total) by mouth daily.   levofloxacin  500 MG tablet Commonly known as: LEVAQUIN  Take 1 tablet (500 mg total) by mouth daily for 5 days.   Nexlizet  180-10 MG Tabs Generic drug: Bempedoic Acid -Ezetimibe  Take 1 tablet by mouth daily.   nitroGLYCERIN  0.4 MG SL tablet Commonly known as:  NITROSTAT  Place 1 tablet (0.4 mg total) under the tongue every 5 (five) minutes x 3 doses as needed for chest pain.   Xarelto  20 MG Tabs tablet Generic drug: rivaroxaban  Take 1 tablet (20 mg total) by mouth daily with supper.        Allergies:  Allergies  Allergen Reactions   Amiodarone Other (See Comments)    elevated liver enzymes   Atorvastatin  Other (See Comments)    Nocturnal leg cramps   Black Pepper-Turmeric Other (See Comments)    heartburn   Metoprolol  Other (See Comments)    Hypotension    Pirfenidone      Mood changes, GI issues   Prevacid [Lansoprazole] Other (See Comments)    Cannot take with plavix     Past Medical History, Surgical history, Social history, and Family History were reviewed and updated.  Review of Systems: All other 10 point review of systems is negative.   Physical Exam:  height is 6' 3 (1.905 m) and weight is 166 lb 12.8 oz (75.7 kg). His oral temperature is 98 F (36.7 C). His blood pressure is 104/62 and his pulse is 76. His respiration is 19 and oxygen  saturation is 98%.   Wt Readings from Last 3 Encounters:  11/27/23 166 lb 12.8 oz (75.7 kg)  09/14/23  168 lb (76.2 kg)  08/10/23 167 lb (75.8 kg)    Ocular: Sclerae unicteric, pupils equal, round and reactive to light Ear-nose-throat: Oropharynx clear, dentition fair Lymphatic: No cervical or supraclavicular adenopathy Lungs no rales or rhonchi, good excursion bilaterally Heart regular rate and rhythm, no murmur appreciated Abd soft, nontender, positive bowel sounds MSK no focal spinal tenderness, no joint edema Neuro: non-focal, well-oriented, appropriate affect Breasts: Deferred   Lab Results  Component Value Date   WBC 4.9 07/24/2023   HGB 11.4 (L) 07/24/2023   HCT 34.1 (L) 07/24/2023   MCV 105.2 (H) 07/24/2023   PLT 175 07/24/2023   Lab Results  Component Value Date   FERRITIN 76 05/04/2023   IRON 88 05/04/2023   TIBC 330 05/04/2023   UIBC 242 05/04/2023    IRONPCTSAT 27 05/04/2023   Lab Results  Component Value Date   RETICCTPCT 0.8 07/24/2023   RBC 3.22 (L) 07/24/2023   RBC 3.24 (L) 07/24/2023   No results found for: KPAFRELGTCHN, LAMBDASER, KAPLAMBRATIO No results found for: IGGSERUM, IGA, IGMSERUM No results found for: STEPHANY CARLOTA BENSON MARKEL EARLA JOANNIE DOC VICK, SPEI   Chemistry      Component Value Date/Time   NA 139 07/24/2023 0845   NA 139 07/12/2021 0821   K 4.3 07/24/2023 0845   CL 103 07/24/2023 0845   CO2 31 07/24/2023 0845   BUN 20 07/24/2023 0845   BUN 19 07/12/2021 0821   CREATININE 1.00 07/24/2023 0845   CREATININE 0.86 04/01/2016 0829      Component Value Date/Time   CALCIUM  9.9 07/24/2023 0845   ALKPHOS 44 07/24/2023 0845   AST 24 07/24/2023 0845   ALT 13 07/24/2023 0845   BILITOT 0.6 07/24/2023 0845       Impression and Plan: Troy Willis is a very pleasant 78 yo caucasian gentleman with macrocytic anemia felt to be reactive to medication. His counts remain stable.     Follow-up in 4 months.   Lauraine Pepper, NP 7/14/20259:13 AM

## 2023-11-29 ENCOUNTER — Encounter: Payer: Self-pay | Admitting: Family Medicine

## 2023-11-29 ENCOUNTER — Encounter: Payer: Self-pay | Admitting: Internal Medicine

## 2023-12-01 NOTE — Telephone Encounter (Signed)
 BEst wishes Troy Willis . Dr Alaine should be able to read everythin on care everywhere. You had PFT May 2025. He can do the next one and on Ct May 2025 radiolgist is ocncerned about progression from 2024.  Recommend you discuss starting OFEV or wait till new drug NERANDIMLAST is approve end of the year OR entere into a clnical trial (we have some IV infusion trials starting in fall 2025)     SIGNATURE    Dr. Dorethia Cave, M.D., F.C.C.P,  Pulmonary and Critical Care Medicine Staff Physician, [facility] Center Director - Interstitial Lung Disease  Program  [facility] Network at [facility] [place], [place], [address]   Pager: [phone], If no answer  -> Check AMION or Try [phone] Telephone (clinical office): [phone] Telephone (research): [phone]  2:40 PM 12/01/2023       Latest Ref Rng & Units 09/14/2023   12:46 PM 05/25/2023   10:42 AM 02/13/2023    3:22 PM 04/01/2022    9:49 AM 03/23/2021    8:57 AM 10/31/2018    8:49 AM 04/13/2017   10:56 AM  PFT Results  FVC-Pre L 3.16  3.22  3.25  3.38  3.62  4.03  P 3.97   FVC-Predicted Pre % 65  66  67  69  74  81  P 78   FVC-Post L    3.47  3.73  4.15  P 4.01   FVC-Predicted Post %    71  76  83  P 79   Pre FEV1/FVC % % 75  74  71  75  73  72  P 70   Post FEV1/FCV % %    76  74  74  P 73   FEV1-Pre L 2.38  2.37  2.31  2.53  2.65  2.90  P 2.80   FEV1-Predicted Pre % 68  68  66  71  74  80  P 75   FEV1-Post L    2.62  2.78  3.05  P 2.94   DLCO uncorrected ml/min/mmHg 14.20  13.38  11.84  15.69  15.10  17.98  P 17.46   DLCO UNC% % 51  48  42  56  53  63  P 46   DLCO corrected ml/min/mmHg 14.20  14.63  11.84  15.69  15.10   19.72   DLCO COR %Predicted % 51  52  42  56  53   52   DLVA Predicted % 77  75  63  78  69  80  P 69   TLC L    5.62  5.83  6.40  P 6.30   TLC % Predicted %    71  74  81  P 80   RV % Predicted %    77  75  89  P 86     P Preliminary result    steopenia.   IMPRESSION: 1. 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 Findings are indeterminate for UIP per consensus guidelines: Diagnosis of Idiopathic Pulmonary Fibrosis: An Official ATS/ERS/JRS/ALAT Clinical Practice Guideline. Am JINNY [employer] Crit Care Med Vol 198, Iss 5, [phone], Jan 14 2017. 2. Aortic atherosclerosis (ICD10-I70.0). Coronary artery calcification.     Electronically Signed   By: Newell Eke M.D.

## 2023-12-20 DIAGNOSIS — H0100A Unspecified blepharitis right eye, upper and lower eyelids: Secondary | ICD-10-CM | POA: Diagnosis not present

## 2023-12-20 DIAGNOSIS — B880 Other acariasis: Secondary | ICD-10-CM | POA: Diagnosis not present

## 2023-12-20 DIAGNOSIS — H0100B Unspecified blepharitis left eye, upper and lower eyelids: Secondary | ICD-10-CM | POA: Diagnosis not present

## 2024-01-02 ENCOUNTER — Encounter: Payer: Self-pay | Admitting: Hematology & Oncology

## 2024-01-05 ENCOUNTER — Ambulatory Visit: Admitting: Internal Medicine

## 2024-01-18 ENCOUNTER — Ambulatory Visit (INDEPENDENT_AMBULATORY_CARE_PROVIDER_SITE_OTHER): Admitting: Family Medicine

## 2024-01-18 ENCOUNTER — Encounter: Payer: Self-pay | Admitting: Family Medicine

## 2024-01-18 VITALS — BP 112/62 | HR 61 | Ht 75.0 in | Wt 168.6 lb

## 2024-01-18 DIAGNOSIS — Z125 Encounter for screening for malignant neoplasm of prostate: Secondary | ICD-10-CM

## 2024-01-18 DIAGNOSIS — R252 Cramp and spasm: Secondary | ICD-10-CM

## 2024-01-18 DIAGNOSIS — E7800 Pure hypercholesterolemia, unspecified: Secondary | ICD-10-CM

## 2024-01-18 DIAGNOSIS — Z131 Encounter for screening for diabetes mellitus: Secondary | ICD-10-CM

## 2024-01-18 DIAGNOSIS — R1012 Left upper quadrant pain: Secondary | ICD-10-CM | POA: Diagnosis not present
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
## 2024-01-18 NOTE — Patient Instructions (Addendum)
 Good to see you again!  Please give Troy Willis a hug for me!  I will be in touch with your lab asap  Recommend flu shot and covid booster this fall  Please let me know if anything is changing or getting worse  I think ok to gradually get back into some abdominal exercises and see if this helps.  If it makes you worse- slow down or stop!

## 2024-01-19 ENCOUNTER — Encounter: Payer: Self-pay | Admitting: Family Medicine

## 2024-01-19 LAB — BASIC METABOLIC PANEL WITH GFR
BUN: 22 mg/dL (ref 6–23)
CO2: 29 meq/L (ref 19–32)
Calcium: 9.4 mg/dL (ref 8.4–10.5)
Chloride: 100 meq/L (ref 96–112)
Creatinine, Ser: 1.01 mg/dL (ref 0.40–1.50)
GFR: 70.87 mL/min (ref >60.00)
Glucose, Bld: 88 mg/dL (ref 70–99)
Potassium: 4.5 meq/L (ref 3.5–5.1)
Sodium: 140 meq/L (ref 135–145)

## 2024-01-19 LAB — LIPID PANEL
Cholesterol: 134 mg/dL (ref 0–200)
HDL: 52 mg/dL (ref >39.00)
LDL Cholesterol: 67 mg/dL (ref 0–99)
NonHDL: 82.29
Total CHOL/HDL Ratio: 3
Triglycerides: 78 mg/dL (ref 0.0–149.0)
VLDL: 15.6 mg/dL (ref 0.0–40.0)

## 2024-01-19 LAB — PSA: PSA: 0.32 ng/mL (ref 0.10–4.00)

## 2024-01-19 LAB — HEMOGLOBIN A1C: Hgb A1c MFr Bld: 6.3 % (ref 4.6–6.5)

## 2024-01-19 LAB — MAGNESIUM: Magnesium: 2 mg/dL (ref 1.5–2.5)

## 2024-01-19 LAB — LIPASE: Lipase: 33 U/L (ref 11.0–59.0)

## 2024-02-20 ENCOUNTER — Other Ambulatory Visit (HOSPITAL_BASED_OUTPATIENT_CLINIC_OR_DEPARTMENT_OTHER): Payer: Self-pay

## 2024-02-20 MED ORDER — COMIRNATY 30 MCG/0.3ML IM SUSY
0.3000 mL | PREFILLED_SYRINGE | Freq: Once | INTRAMUSCULAR | 0 refills | Status: AC
  Filled 2024-02-20: qty 0.3, 1d supply, fill #0

## 2024-02-20 MED ORDER — FLUZONE HIGH-DOSE 0.5 ML IM SUSY
0.5000 mL | PREFILLED_SYRINGE | Freq: Once | INTRAMUSCULAR | 0 refills | Status: AC
  Filled 2024-02-20: qty 0.5, 1d supply, fill #0

## 2024-02-27 ENCOUNTER — Other Ambulatory Visit: Payer: Self-pay

## 2024-02-27 ENCOUNTER — Other Ambulatory Visit (HOSPITAL_BASED_OUTPATIENT_CLINIC_OR_DEPARTMENT_OTHER): Payer: Self-pay

## 2024-02-27 ENCOUNTER — Other Ambulatory Visit: Payer: Self-pay | Admitting: Cardiology

## 2024-02-27 DIAGNOSIS — I4821 Permanent atrial fibrillation: Secondary | ICD-10-CM

## 2024-02-27 MED ORDER — RIVAROXABAN 20 MG PO TABS
20.0000 mg | ORAL_TABLET | Freq: Every day | ORAL | 1 refills | Status: AC
  Filled 2024-02-27: qty 90, 90d supply, fill #0
  Filled 2024-05-26: qty 90, 90d supply, fill #1

## 2024-02-27 NOTE — Telephone Encounter (Signed)
 Prescription refill request for Xarelto  received.  Indication:afib Last office visit:3/25 Weight:76.5  kg Age:78 Scr:1.01  9/25 CrCl:64.17  ml/min  Prescription refilled

## 2024-03-01 ENCOUNTER — Other Ambulatory Visit (HOSPITAL_BASED_OUTPATIENT_CLINIC_OR_DEPARTMENT_OTHER): Payer: Self-pay | Admitting: Family

## 2024-03-01 DIAGNOSIS — I080 Rheumatic disorders of both mitral and aortic valves: Secondary | ICD-10-CM

## 2024-03-05 ENCOUNTER — Other Ambulatory Visit (HOSPITAL_BASED_OUTPATIENT_CLINIC_OR_DEPARTMENT_OTHER): Payer: Self-pay

## 2024-03-05 MED ORDER — FUROSEMIDE 20 MG PO TABS
20.0000 mg | ORAL_TABLET | Freq: Every day | ORAL | 1 refills | Status: AC
  Filled 2024-03-05: qty 90, 90d supply, fill #0

## 2024-03-26 ENCOUNTER — Other Ambulatory Visit (HOSPITAL_BASED_OUTPATIENT_CLINIC_OR_DEPARTMENT_OTHER): Payer: Self-pay

## 2024-03-27 ENCOUNTER — Inpatient Hospital Stay: Attending: Hematology & Oncology

## 2024-03-27 ENCOUNTER — Inpatient Hospital Stay: Admitting: Family

## 2024-03-27 VITALS — BP 109/68 | HR 62 | Temp 97.7°F | Resp 16 | Wt 168.0 lb

## 2024-03-27 DIAGNOSIS — D518 Other vitamin B12 deficiency anemias: Secondary | ICD-10-CM

## 2024-03-27 DIAGNOSIS — D539 Nutritional anemia, unspecified: Secondary | ICD-10-CM | POA: Insufficient documentation

## 2024-03-27 DIAGNOSIS — J849 Interstitial pulmonary disease, unspecified: Secondary | ICD-10-CM | POA: Diagnosis not present

## 2024-03-27 DIAGNOSIS — D649 Anemia, unspecified: Secondary | ICD-10-CM

## 2024-03-27 LAB — CBC WITH DIFFERENTIAL (CANCER CENTER ONLY)
Abs Immature Granulocytes: 0.02 K/uL (ref 0.00–0.07)
Basophils Absolute: 0 K/uL (ref 0.0–0.1)
Basophils Relative: 1 %
Eosinophils Absolute: 0.1 K/uL (ref 0.0–0.5)
Eosinophils Relative: 2 %
HCT: 34 % — ABNORMAL LOW (ref 39.0–52.0)
Hemoglobin: 11.2 g/dL — ABNORMAL LOW (ref 13.0–17.0)
Immature Granulocytes: 0 %
Lymphocytes Relative: 27 %
Lymphs Abs: 1.7 K/uL (ref 0.7–4.0)
MCH: 34.5 pg — ABNORMAL HIGH (ref 26.0–34.0)
MCHC: 32.9 g/dL (ref 30.0–36.0)
MCV: 104.6 fL — ABNORMAL HIGH (ref 80.0–100.0)
Monocytes Absolute: 0.7 K/uL (ref 0.1–1.0)
Monocytes Relative: 10 %
Neutro Abs: 3.8 K/uL (ref 1.7–7.7)
Neutrophils Relative %: 60 %
Platelet Count: 183 K/uL (ref 150–400)
RBC: 3.25 MIL/uL — ABNORMAL LOW (ref 4.22–5.81)
RDW: 13.9 % (ref 11.5–15.5)
WBC Count: 6.4 K/uL (ref 4.0–10.5)
nRBC: 0 % (ref 0.0–0.2)

## 2024-03-27 LAB — SAVE SMEAR(SSMR), FOR PROVIDER SLIDE REVIEW

## 2024-03-27 LAB — CMP (CANCER CENTER ONLY)
ALT: 16 U/L (ref 0–44)
AST: 33 U/L (ref 15–41)
Albumin: 4.5 g/dL (ref 3.5–5.0)
Alkaline Phosphatase: 58 U/L (ref 38–126)
Anion gap: 10 (ref 5–15)
BUN: 19 mg/dL (ref 8–23)
CO2: 27 mmol/L (ref 22–32)
Calcium: 9.8 mg/dL (ref 8.9–10.3)
Chloride: 102 mmol/L (ref 98–111)
Creatinine: 0.89 mg/dL (ref 0.61–1.24)
GFR, Estimated: >60 mL/min (ref >60)
Glucose, Bld: 105 mg/dL — ABNORMAL HIGH (ref 70–99)
Potassium: 4.7 mmol/L (ref 3.5–5.1)
Sodium: 138 mmol/L (ref 135–145)
Total Bilirubin: 0.5 mg/dL (ref 0.0–1.2)
Total Protein: 8.1 g/dL (ref 6.5–8.1)

## 2024-03-27 LAB — RETICULOCYTES
Immature Retic Fract: 11.5 % (ref 2.3–15.9)
RBC.: 3.19 MIL/uL — ABNORMAL LOW (ref 4.22–5.81)
Retic Count, Absolute: 36.7 K/uL (ref 19.0–186.0)
Retic Ct Pct: 1.2 % (ref 0.4–3.1)

## 2024-03-27 LAB — VITAMIN B12: Vitamin B-12: 1433 pg/mL — ABNORMAL HIGH (ref 180–914)

## 2024-03-27 LAB — LACTATE DEHYDROGENASE: LDH: 274 U/L — ABNORMAL HIGH (ref 105–235)

## 2024-03-27 NOTE — Progress Notes (Signed)
 Hematology and Oncology Follow Up Visit  Troy Willis [id number] [birth year] 78 y.o. 03/27/2024   Principle Diagnosis:  Macrocytic anemia    Current Therapy:        B12  -- 2500 mcg PO daily   Interim History:  Troy Willis is here today for follow-up. He is doing well and has no complaints at this time.  He notes occasional mild fatigue and feeling a little winded when he first exerts himself. He will take a break to rest if needed.  No issue with fever, chills, n/v, cough, rash, dizziness, SOB, chest pain, palpitations, abdominal pain or changes in bowel or bladder habits.  No swelling in his extremities.  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  No falls or syncope reported.   Appetite and hydration are good. Weight is stable at 168 lbs.   ECOG Performance Status: 0 - Asymptomatic  Medications:  Allergies as of 03/27/2024       Reactions   Amiodarone Other (See Comments)   elevated liver enzymes   Atorvastatin  Other (See Comments)   Nocturnal leg cramps   Black Pepper-turmeric Other (See Comments)   heartburn   Metoprolol  Other (See Comments)   Hypotension   Pirfenidone     Mood changes, GI issues   Prevacid [lansoprazole] Other (See Comments)   Cannot take with plavix         Medication List        Accurate as of March 27, 2024  2:58 PM. If you have any questions, ask your nurse or doctor.          famotidine 20 MG tablet Commonly known as: PEPCID Take 20 mg by mouth daily as needed for heartburn or indigestion.   fluorouracil  5 % cream Commonly known as: EFUDEX  Apply 1 a small amount to skin twice a day For 2 weeks   furosemide  20 MG tablet Commonly known as: LASIX  Take 1 tablet (20 mg total) by mouth daily.   levofloxacin  500 MG tablet Commonly known as: LEVAQUIN  Take 1 tablet (500 mg total) by mouth daily for 5 days.   Nexlizet  180-10 MG Tabs Generic drug: Bempedoic Acid -Ezetimibe  Take 1 tablet by mouth  daily.   nitroGLYCERIN  0.4 MG SL tablet Commonly known as: NITROSTAT  Place 1 tablet (0.4 mg total) under the tongue every 5 (five) minutes x 3 doses as needed for chest pain.   Xarelto  20 MG Tabs tablet Generic drug: rivaroxaban  Take 1 tablet (20 mg total) by mouth daily with supper.        Allergies:  Allergies  Allergen Reactions   Amiodarone Other (See Comments)    elevated liver enzymes   Atorvastatin  Other (See Comments)    Nocturnal leg cramps   Black Pepper-Turmeric Other (See Comments)    heartburn   Metoprolol  Other (See Comments)    Hypotension    Pirfenidone      Mood changes, GI issues   Prevacid [Lansoprazole] Other (See Comments)    Cannot take with plavix     Past Medical History, Surgical history, Social history, and Family History were reviewed and updated.  Review of Systems: All other 10 point review of systems is negative.   Physical Exam:  vitals were not taken for this visit.   Wt Readings from Last 3 Encounters:  01/18/24 168 lb 9.6 oz (76.5 kg)  11/27/23 166 lb 12.8 oz (75.7 kg)  09/14/23 168 lb (76.2 kg)    Ocular: Sclerae unicteric, pupils equal, round and reactive to light  Ear-nose-throat: Oropharynx clear, dentition fair Lymphatic: No cervical or supraclavicular adenopathy Lungs no rales or rhonchi, good excursion bilaterally Heart regular rate and rhythm, no murmur appreciated Abd soft, nontender, positive bowel sounds MSK no focal spinal tenderness, no joint edema Neuro: non-focal, well-oriented, appropriate affect Breasts: Deferred   Lab Results  Component Value Date   WBC 5.6 11/27/2023   HGB 11.5 (L) 11/27/2023   HCT 34.1 (L) 11/27/2023   MCV 103.6 (H) 11/27/2023   PLT 211 11/27/2023   Lab Results  Component Value Date   FERRITIN 76 05/04/2023   IRON 88 05/04/2023   TIBC 330 05/04/2023   UIBC 242 05/04/2023   IRONPCTSAT 27 05/04/2023   Lab Results  Component Value Date   RETICCTPCT 0.8 11/27/2023   RBC 3.29 (L)  11/27/2023   RBC 3.29 (L) 11/27/2023   No results found for: KPAFRELGTCHN, LAMBDASER, KAPLAMBRATIO No results found for: IGGSERUM, IGA, IGMSERUM No results found for: STEPHANY CARLOTA BENSON MARKEL EARLA JOANNIE DOC VICK, SPEI   Chemistry      Component Value Date/Time   NA 140 01/18/2024 1607   NA 139 07/12/2021 0821   K 4.5 01/18/2024 1607   CL 100 01/18/2024 1607   CO2 29 01/18/2024 1607   BUN 22 01/18/2024 1607   BUN 19 07/12/2021 0821   CREATININE 1.01 01/18/2024 1607   CREATININE 0.92 11/27/2023 0851   CREATININE 0.86 04/01/2016 0829      Component Value Date/Time   CALCIUM  9.4 01/18/2024 1607   ALKPHOS 53 11/27/2023 0851   AST 32 11/27/2023 0851   ALT 15 11/27/2023 0851   BILITOT 0.6 11/27/2023 0851       Impression and Plan: Mr. Tiggs is a very pleasant 78 yo caucasian gentleman with macrocytic anemia felt to be reactive to medication. His counts remain stable.     Follow-up in 6 months.  Lauraine Pepper, NP 11/12/20252:58 PM

## 2024-03-29 ENCOUNTER — Inpatient Hospital Stay

## 2024-03-29 ENCOUNTER — Ambulatory Visit: Admitting: Family

## 2024-04-10 DIAGNOSIS — I2721 Secondary pulmonary arterial hypertension: Secondary | ICD-10-CM | POA: Diagnosis not present

## 2024-04-10 DIAGNOSIS — I4821 Permanent atrial fibrillation: Secondary | ICD-10-CM | POA: Diagnosis not present

## 2024-04-10 DIAGNOSIS — G4733 Obstructive sleep apnea (adult) (pediatric): Secondary | ICD-10-CM | POA: Diagnosis not present

## 2024-04-10 DIAGNOSIS — J479 Bronchiectasis, uncomplicated: Secondary | ICD-10-CM | POA: Diagnosis not present

## 2024-04-10 DIAGNOSIS — K766 Portal hypertension: Secondary | ICD-10-CM | POA: Diagnosis not present

## 2024-04-10 DIAGNOSIS — J849 Interstitial pulmonary disease, unspecified: Secondary | ICD-10-CM | POA: Diagnosis not present

## 2024-04-10 DIAGNOSIS — I2510 Atherosclerotic heart disease of native coronary artery without angina pectoris: Secondary | ICD-10-CM | POA: Diagnosis not present

## 2024-04-22 ENCOUNTER — Other Ambulatory Visit: Payer: Self-pay

## 2024-04-30 ENCOUNTER — Encounter: Payer: Self-pay | Admitting: Family Medicine

## 2024-04-30 ENCOUNTER — Telehealth: Payer: Self-pay | Admitting: Cardiology

## 2024-04-30 NOTE — Telephone Encounter (Signed)
 Called patient - pt notes he typically does not feel palpitations but he is feeling this more of the time for the last week or so  He is on xarelto  but no rate control He has started working about again at the gym over the last month.  He felt like he was doing fine but then over the last week he has not felt as well after exercise. In particular a week ago he was pushing harder than normal doing cardio at the gym and then had an episode of feeling lightheaded and pre-syncopal  He is feeling more SOB since this incident and has noted his heart going fast although he does not have a smart watch to check his pulse  No CP that feels like the when he had his MI He checked his weight this am and it was not changed  Suspect he may be having A fib with RVR  He is a cardiology pt of Dr Shlomo- I called [facility] to request that he be seen; they asked me to have patient call them so they can triage and schedule, I called pt back and gave him this info and he will call now

## 2024-04-30 NOTE — Telephone Encounter (Signed)
 S/w the patient- please see notes  from Dr Harlene Copland-   Made appt for 05/01/24 with APP. Given address and location. Given ER precautions. He verbalized understanding of information.

## 2024-04-30 NOTE — Telephone Encounter (Signed)
 Pt called in stating he spoke to Dr. Ubaldo about him getting seen. States he was told to be seen for an EKG today or tomorrow. Please advise.   How long have you had palpitations/irregular HR/ Afib? Are you having the symptoms now? Week   Are you currently experiencing lightheadedness, SOB or CP? Nothing currently   Do you have a history of afib (atrial fibrillation) or irregular heart rhythm? No   Have you checked your BP or HR? (document readings if available): n/a   Are you experiencing any other symptoms? SOB, lightheadedness     Copland, Harlene BROCKS, MD   04/30/24  1:48 PM Note Called patient - pt notes he typically does not feel palpitations but he is feeling this more of the time for the last week or so   He is on xarelto  but no rate control He has started working about again at the gym over the last month.  He felt like he was doing fine but then over the last week he has not felt as well after exercise. In particular a week ago he was pushing harder than normal doing cardio at the gym and then had an episode of feeling lightheaded and pre-syncopal  He is feeling more SOB since this incident and has noted his heart going fast although he does not have a smart watch to check his pulse  No CP that feels like the when he had his MI He checked his weight this am and it was not changed  Suspect he may be having A fib with RVR   He is a cardiology pt of Dr Shlomo- I called [facility] to request that he be seen; they asked me to have patient call them so they can triage and schedule, I called pt back and gave him this info and he will call now

## 2024-05-01 ENCOUNTER — Ambulatory Visit: Attending: Emergency Medicine | Admitting: Emergency Medicine

## 2024-05-01 ENCOUNTER — Encounter: Payer: Self-pay | Admitting: Emergency Medicine

## 2024-05-01 VITALS — BP 98/50 | HR 59 | Ht 75.0 in | Wt 167.0 lb

## 2024-05-01 DIAGNOSIS — I361 Nonrheumatic tricuspid (valve) insufficiency: Secondary | ICD-10-CM | POA: Diagnosis not present

## 2024-05-01 DIAGNOSIS — G4733 Obstructive sleep apnea (adult) (pediatric): Secondary | ICD-10-CM | POA: Diagnosis not present

## 2024-05-01 DIAGNOSIS — R42 Dizziness and giddiness: Secondary | ICD-10-CM

## 2024-05-01 DIAGNOSIS — I272 Pulmonary hypertension, unspecified: Secondary | ICD-10-CM | POA: Diagnosis not present

## 2024-05-01 DIAGNOSIS — E785 Hyperlipidemia, unspecified: Secondary | ICD-10-CM

## 2024-05-01 DIAGNOSIS — I25118 Atherosclerotic heart disease of native coronary artery with other forms of angina pectoris: Secondary | ICD-10-CM

## 2024-05-01 DIAGNOSIS — I4821 Permanent atrial fibrillation: Secondary | ICD-10-CM | POA: Diagnosis not present

## 2024-05-01 DIAGNOSIS — I340 Nonrheumatic mitral (valve) insufficiency: Secondary | ICD-10-CM | POA: Diagnosis not present

## 2024-05-01 DIAGNOSIS — R55 Syncope and collapse: Secondary | ICD-10-CM | POA: Diagnosis not present

## 2024-05-01 LAB — CBC
Hematocrit: 33.3 % — ABNORMAL LOW (ref 37.5–51.0)
Hemoglobin: 11.1 g/dL — ABNORMAL LOW (ref 13.0–17.7)
MCH: 35 pg — ABNORMAL HIGH (ref 26.6–33.0)
MCHC: 33.3 g/dL (ref 31.5–35.7)
MCV: 105 fL — ABNORMAL HIGH (ref 79–97)
Platelets: 181 x10E3/uL (ref 150–450)
RBC: 3.17 x10E6/uL — ABNORMAL LOW (ref 4.14–5.80)
RDW: 12.6 % (ref 11.6–15.4)
WBC: 5.7 x10E3/uL (ref 3.4–10.8)

## 2024-05-01 LAB — BASIC METABOLIC PANEL WITH GFR
BUN/Creatinine Ratio: 24 (ref 10–24)
BUN: 23 mg/dL (ref 8–27)
CO2: 27 mmol/L (ref 20–29)
Calcium: 9.5 mg/dL (ref 8.6–10.2)
Chloride: 100 mmol/L (ref 96–106)
Creatinine, Ser: 0.95 mg/dL (ref 0.76–1.27)
Glucose: 98 mg/dL (ref 70–99)
Potassium: 4.4 mmol/L (ref 3.5–5.2)
Sodium: 137 mmol/L (ref 134–144)
eGFR: 81 mL/min/1.73 (ref >59)

## 2024-05-01 NOTE — Progress Notes (Signed)
 Cardiology Office Note:    Date:  05/01/2024  ID:  Troy Willis, DOB 1945-06-23, MRN [id number] PCP: Watt Harlene BROCKS, MD  [facility] Providers Cardiologist:  Wilbert Bihari, MD       Patient Profile:       Chief Complaint: Acute visit for presyncope History of Present Illness:  Troy Willis is a 78 y.o. male with visit-pertinent history of hyperlipidemia, permanent atrial fibrillation on Xarelto , coronary artery disease s/p MI s/p DES to RCA 2017, broncchiectasis/IPF, moderate pulmonary hypertension, mitral regurgitation, tricuspid regurgitation, obstructive sleep apnea  History of MI with DES to RCA on 09/07/15 and 50% stenosis in mid LAD with normal FFR.   Was seen 03/2021 with stable chronic exertional dyspnea due to  bronchiectasis. Updated echo was ordered for monitoring of mitral regurgitation.    He had echocardiogram 04/16/21 with LVEF 60-65%, no RWMA, mild LVH, indeterminate diastolic parameters, RVSF mildly reduced, RV mildly enlarged, moderately elevated PASP, RVSP 45.2 mmHg, LA severely dilated, RA moderately dilated, bileaflet mitral valve prolapse, moderate to severe MR, moderate TR, aortic root and ascending aorta 37mm upper limit of normal, could not exlude PFO.    He was seen 05/04/21 and set up for transesophageal echocardiogram for monitoring of MR. His Lasix  was increased to 20mg  daily due to orthopnea, occasional PND, LE edema. TEE 05/26/21 showed normal LVEF, moderate MR with myxomatous mitral valve with bileaflet prolapse, moderate TR, bilateral atria severely dilated, mild (gr2) plaque involving aortic arch, negative bubble study.    Repeat echo 11/2021 EF 55-60%, no RWMA, normal strain, LA moderate dilated, RA severely dilated, moderate MR (thought to be atrial FMR not primarily structural MR), trivial AI  Echocardiogram 08/02/2022 with LVEF 60 to 65%, no RWMA, mild LVH, RV function and size normal, mildly elevated PASP, estimated right  ventricular systolic pressure of 37.2 mmHg, biatrial size severely dilated, myxomatous mitral valve with mild to moderate mitral valve regurgitation, moderate tricuspid valve regurgitation  Echocardiogram 08/03/2023 showed LVEF 60 to 65%, no RWMA, mild LVH, RV function size normal, mildly elevated PASP, biatrial size severely dilated, mild mitral valve regurgitation, moderate to severe tricuspid valve regurgitation  Last seen in clinic on 08/10/2023 by Dr. Bihari.  Reported that he has had shortness of breath that is unchanged.  He has chronic lower extremity edema control with diuretics.  Remain compliant with CPAP.  He was without any anginal symptoms.  He remained in permanent atrial fibrillation with good heart rate control.   Discussed the use of AI scribe software for clinical note transcription with the patient, who gave verbal consent to proceed.  History of Present Illness Troy Willis is a 78 year old male with coronary artery disease, permanent atrial fibrillation, and pulmonary hypertension who presents with shortness of breath, palpitations, and dizziness.   About six weeks ago he started a new exercise program with 45-60 minutes on the treadmill each morning, beginning at moderate speed and low incline, then increasing incline until he becomes short of breath. About one week ago, while on the treadmill, he developed dizziness and felt he might pass out, which lasted less than 30 seconds and resolved after he stepped off. He then resumed his workout. He has had similar brief lightheaded episodes when standing after sitting in a car for 30-40 minutes.  He has coronary artery disease with prior stent and permanent atrial fibrillation. His blood pressure is usually in the 130s/60s at rest.  Since the treadmill episode he feels weaker  and mildly short of breath, describing reduced exercise capacity. He denies chest pains and prior anginal equivalent.  He has returned to exercise  and he has not had recurrent dizziness with exercise since this event.  He stopped alcohol and caffeinated coffee a couple weeks prior to the event.  He now drinks about two quarts of water  daily without electrolyte solutions.  He does not routinely monitor heart rate with exercise. He denies significant daytime palpitations but notices nocturnal fluttering. He is not taking blood pressure-lowering medications, which were stopped previously because of dizziness when standing after riding in a car.   Review of systems:  Please see the history of present illness. All other systems are reviewed and otherwise negative.      Studies Reviewed:    EKG Interpretation Date/Time:  Wednesday May 01 2024 15:36:03 EST Ventricular Rate:  59 PR Interval:    QRS Duration:  96 QT Interval:  442 QTC Calculation: 437 R Axis:   72  Text Interpretation: Atrial fibrillation with slow ventricular response Incomplete right bundle branch block Cannot rule out Anterior infarct , age undetermined When compared with ECG of 02-Aug-2020 12:11, PREVIOUS ECG IS PRESENT Confirmed by Rana Dixon [phone]) on 05/01/2024 4:27:33 PM    Echocardiogram 08/03/2023 1. Left ventricular ejection fraction, by estimation, is 60 to 65%. The  left ventricle has normal function. The left ventricle has no regional  wall motion abnormalities. There is mild left ventricular hypertrophy.  Left ventricular diastolic parameters  are indeterminate.   2. Right ventricular systolic function is normal. The right ventricular  size is normal. There is mildly elevated pulmonary artery systolic  pressure.   3. Left atrial size was severely dilated.   4. Right atrial size was severely dilated.   5. The mitral valve is normal in structure. Mild mitral valve  regurgitation. No evidence of mitral stenosis.   6. Tricuspid valve regurgitation is moderate to severe.   7. The aortic valve is tricuspid. Aortic valve regurgitation is not   visualized. No aortic stenosis is present.   8. The inferior vena cava is dilated in size with >50% respiratory  variability, suggesting right atrial pressure of 8 mmHg.   Echocardiogram 08/02/2022 1. Left ventricular ejection fraction, by estimation, is 60 to 65%. The  left ventricle has normal function. The left ventricle has no regional  wall motion abnormalities. There is mild left ventricular hypertrophy.  Left ventricular diastolic parameters  are indeterminate.   2. Right ventricular systolic function is normal. The right ventricular  size is normal. There is mildly elevated pulmonary artery systolic  pressure. The estimated right ventricular systolic pressure is 37.2 mmHg.   3. Left atrial size was severely dilated.   4. Right atrial size was severely dilated.   5. Bileaflet prolapse. The mitral valve is myxomatous. Mild to moderate  mitral valve regurgitation.   6. Tricuspid valve regurgitation is moderate.   7. Aortic valve regurgitation is not visualized.   8. The inferior vena cava is normal in size with <50% respiratory  variability, suggesting right atrial pressure of 8 mmHg.   9. Cannot exclude a small PFO.    Lexiscan  Myoview  09/08/2017 Nuclear stress EF: 56%. The study is normal. This is a low risk study. The left ventricular ejection fraction is normal (55-65%). There was no ST segment deviation noted during stress.  Cardiac catheterization 09/06/2025 Mid LAD lesion, 50% stenosed. FFR of 0.84 which is not felt to be significant. Prox RCA lesion, 80% stenosed. Post  intervention with a 4.0 x 16 Synergy drug-eluting stent, postdilated to greater than 4.5 mm, there is a 0% residual stenosis. Normal left ventricular function. LVEDP 15 mmHg.   Continue dual antiplatelet therapy for at least 6 months.  If it is decided that he should be on anticoagulation for his atrial fibrillation, antiplatelet therapy would have to be adjusted. Continue aggressive secondary prevention  and risk factor modification. Diagnostic Dominance: Right  Intervention   Risk Assessment/Calculations:    CHA2DS2-VASc Score = 4   This indicates a 4.8% annual risk of stroke. The patient's score is based upon: CHF History: 0 HTN History: 1 Diabetes History: 0 Stroke History: 0 Vascular Disease History: 1 Age Score: 2 Gender Score: 0             Physical Exam:   VS:  BP (!) 98/50   Pulse (!) 59   Ht 6' 3 (1.905 m)   Wt 167 lb (75.8 kg)   BMI 20.87 kg/m    Wt Readings from Last 3 Encounters:  05/01/24 167 lb (75.8 kg)  03/27/24 168 lb (76.2 kg)  01/18/24 168 lb 9.6 oz (76.5 kg)    GEN: Well nourished, well developed in no acute distress NECK: No JVD; No carotid bruits CARDIAC: Irregularly irregular rhythm.  1/6 murmur.  No rubs, gallops RESPIRATORY:  Clear to auscultation without rales, wheezing or rhonchi  ABDOMEN: Soft, non-tender, non-distended EXTREMITIES:  No edema; No acute deformity      Assessment and Plan:  Presyncope Dizziness Began new exercise program about 6 weeks ago on a treadmill.  About 1 week ago he was on the treadmill walking incline and developed an episode of dizziness and then presyncope which lasted less than 30 seconds and resolved.  He has returned to exercise since the event without recurrent symptoms.  He denies syncope, tachycardia, or chest discomfort.  He has rare palpitations but only noticeable at nighttime.  However he notes ongoing fatigue and mild chronic dyspnea which is unchanged - No current symptoms on exam today.  Remains in atrial fibrillation with controlled rate of 59 bpm - Initial blood pressure at visit today was 90/48 and repeat 98/50.  Does not currently measure blood pressure at home.  His symptoms possibly related to orthostasis - He is not on any medications that would lower his blood pressure - I have encouraged him to increase his fluid intake - He will begin monitoring his blood pressure daily and send 1 weeks  log over MyChart - Given the only 1 episode I will hold off on ZIO monitoring at this time - Plan for BMET and CBC today for electrolytes and blood count - I will have him follow-up in 6 weeks for reevaluation  Coronary artery disease S/p MI s/p DES to RCA on 08/2015 and 50% stenosis in the mid LAD however FFR was normal at that site - He denies chest pains.  No change to chronic DOE (history of bronchiectasis).  Denies prior anginal equivalent.  No symptoms to suggest active angina.  No indication for further ischemic evaluation at this time - No ASA due to DOAC - Continue Nexlizet  180-10 mg daily  Permanent atrial fibrillation He remains in atrial fibrillation today with good heart rate control of 59 bpm - Has rare palpitations and no complaints of tachycardia - Continue Xarelto  20 mg daily, denies bleeding concerns  Hyperlipidemia, LDL goal <70 LDL 67 on 01/2024 and well-controlled - Continue Nexlizet  180-10 mg daily  Moderate pulmonary HTN Likely  related to underlying lung disease with bronchiectasis and IPF  Echo 07/2022 with PASP 37.2 mmHg Echo 07/2023 with PASP of 43.3 mmHg - Today he appears euvolemic and well compensated without LEE - Continue furosemide  20 mg as needed  Mitral valve regurgitation Echo 07/2023 with mild mitral valve regurgitation - No indication further intervention at this time.  Pending repeat echocardiogram on 07/2024 for routine evaluation  Tricuspid valve regurgitation Echo 07/2023 with moderate to severe tricuspid valve regurgitation with normal RV size and function -No indication for further intervention at this time.  Pending repeat echocardiogram on 07/2024 for routine evaluation - He appears well compensated.  Continue furosemide  20 mg as needed  Obstructive sleep apnea - Remains adherent to CPAP therapy      Dispo:  Return in about 6 weeks (around 06/12/2024).  Signed, Lum LITTIE Louis, NP

## 2024-05-01 NOTE — Patient Instructions (Signed)
 Medication Instructions:  NO CHANGES  Lab Work: BMET AND CBC TO BE DONE TODAY.  Testing/Procedures: NONE  Follow-Up: At [facility], you and your health needs are our priority.  As part of our continuing mission to provide you with exceptional heart care, our providers are all part of one team.  This team includes your primary Cardiologist (physician) and Advanced Practice Providers or APPs (Physician Assistants and Nurse Practitioners) who all work together to provide you with the care you need, when you need it.  Your next appointment:   6 WEEKS  Provider:   MADISON FOUNTAIN, NP   We recommend signing up for the patient portal called MyChart.  Sign up information is provided on this After Visit Summary.  MyChart is used to connect with patients for Virtual Visits (Telemedicine).  Patients are able to view lab/test results, encounter notes, upcoming appointments, etc.  Non-urgent messages can be sent to your provider as well.   To learn more about what you can do with MyChart, go to [email or web address].   Other Instructions: Please check your blood pressure daily for two weeks, then contact the office with your readings  Please contact the office with your readings either by phone, by dropping it off in person, or by sending it through MyChart.   Be sure to check your blood pressure one to two hours after taking your medications.  Avoid the following for 30 minutes before checking your blood pressure: No caffeine No alcohol No eating No smoking  No exercise  Five minutes before checking your blood pressure: Use the restroom Sit up straight in a chair with your back supported and feet flat on the floor Remain quiet and do not talk

## 2024-05-02 ENCOUNTER — Encounter: Payer: Self-pay | Admitting: Family Medicine

## 2024-05-02 ENCOUNTER — Ambulatory Visit: Payer: Self-pay | Admitting: Emergency Medicine

## 2024-05-10 ENCOUNTER — Encounter: Payer: Self-pay | Admitting: Family Medicine

## 2024-05-10 ENCOUNTER — Ambulatory Visit (INDEPENDENT_AMBULATORY_CARE_PROVIDER_SITE_OTHER): Admitting: Family Medicine

## 2024-05-10 ENCOUNTER — Other Ambulatory Visit (HOSPITAL_BASED_OUTPATIENT_CLINIC_OR_DEPARTMENT_OTHER): Payer: Self-pay

## 2024-05-10 VITALS — BP 100/68 | HR 60 | Temp 97.6°F | Resp 16 | Ht 75.0 in | Wt 160.4 lb

## 2024-05-10 DIAGNOSIS — R051 Acute cough: Secondary | ICD-10-CM

## 2024-05-10 MED ORDER — DOXYCYCLINE HYCLATE 100 MG PO TABS
100.0000 mg | ORAL_TABLET | Freq: Two times a day (BID) | ORAL | 0 refills | Status: DC
  Filled 2024-05-10: qty 14, 7d supply, fill #0

## 2024-05-10 NOTE — Progress Notes (Signed)
 Chief Complaint  Patient presents with   Acute Visit    Patient presents today for SOB, cough, sore throat, low energy, no appetite for several days now. He would like a chest xray to rule out pneumonia.    Troy Willis here for URI complaints.  Duration: 2 weeks - worsening over past 5 d Associated symptoms: Fever (100.3 F), sore throat, shortness of breath, and coughing Denies: sinus congestion, sinus pain, rhinorrhea, itchy watery eyes, ear pain, ear drainage, wheezing, myalgia, and N/V Treatment to date: Levaquin , Tylenol , Mucinex , Tessalon  Perles Sick contacts: No  Past Medical History:  Diagnosis Date   Acquired dilation of ascending aorta and aortic root    38mm ascending aorta by echo 09/2020   Anemia    mild    Arthritis    hands, lower back   Bronchiectasis (HCC)    CAD (coronary artery disease), native coronary artery    a. 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 08/2015 CT Chest: mild bilat hilar adenopathy and mild soft tissue prominence in inf right hilum, left hilum. Also areas of soft tissue and low-density material filling LLL bronchial airways->? mucous vs mass.   Hx of adenomatous colonic polyps 08/13/2015   Hyperlipidemia    MVP (mitral valve prolapse)    Mild MR On echo 07/2023   Myocardial infarction [facility])    Nonspecific elevation of levels of transaminase or lactic acid dehydrogenase (LDH)    PMH of ; ? 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 CHA2DS2VASc = 2 -->Xarelto  started 08/2015.   Pneumonia, bacterial 05/18/2012   Pulmonary HTN (HCC)    PASP 38-78mmHg on echo 07/2023>>Likely Group 3 related to  bronchiectasis>>repeat echo 09/2020 with PASP   Pulmonary nodules    a. 08/2015 CT Chest: reticulonodular opacities in RUL - largest 5mm - likely 2/2 inflammatory process.   Tricuspid regurgitation     Objective BP 100/68   Pulse 60   Temp 97.6 F (36.4 C)   Resp 16   Ht 6' 3 (1.905 m)   Wt 160 lb 6.4 oz (72.8 kg)   SpO2 94%   BMI 20.05 kg/m  General: Awake, alert, appears stated age HEENT: AT, [place], ears patent b/l and TM's neg, nares patent w/o discharge, pharynx pink and without exudates, MMM, no sinus ttp Neck: No masses or asymmetry Heart: RRR Lungs: CTAB, no accessory muscle use Psych: Age appropriate judgment and insight, normal mood and affect  Acute cough  Continue to push fluids, practice good hand hygiene, cover mouth when coughing. Stop LQ. Start 7 d of doxy. CXR for downstairs if needed. Send message in 2-3 d if not obviously better.  F/u prn. If starting to experience fevers, shaking, or worsening shortness of breath, seek immediate care. Pt voiced understanding and agreement to the plan.  Mabel [place], DO 05/10/2024 11:56 AM

## 2024-05-10 NOTE — Patient Instructions (Addendum)
 Continue to push fluids, practice good hand hygiene, and cover your mouth if you cough.  If you start having fevers, shaking or shortness of breath, seek immediate care.  OK to take Tylenol  1000 mg (2 extra strength tabs) or 975 mg (3 regular strength tabs) every 6 hours as needed.  Send me a message in 2-3 days if not obviously better.   We are done with the Levaquin  for now.l   Let us  know if you need anything.

## 2024-05-14 ENCOUNTER — Other Ambulatory Visit (HOSPITAL_BASED_OUTPATIENT_CLINIC_OR_DEPARTMENT_OTHER): Payer: Self-pay

## 2024-05-14 MED ORDER — DOXYCYCLINE HYCLATE 100 MG PO TABS
100.0000 mg | ORAL_TABLET | Freq: Two times a day (BID) | ORAL | 0 refills | Status: AC
  Filled 2024-05-14 – 2024-05-16 (×2): qty 6, 3d supply, fill #0

## 2024-05-17 ENCOUNTER — Other Ambulatory Visit (HOSPITAL_BASED_OUTPATIENT_CLINIC_OR_DEPARTMENT_OTHER): Payer: Self-pay

## 2024-05-26 ENCOUNTER — Other Ambulatory Visit (HOSPITAL_BASED_OUTPATIENT_CLINIC_OR_DEPARTMENT_OTHER): Payer: Self-pay

## 2024-05-27 ENCOUNTER — Other Ambulatory Visit: Payer: Self-pay

## 2024-05-27 ENCOUNTER — Other Ambulatory Visit (HOSPITAL_BASED_OUTPATIENT_CLINIC_OR_DEPARTMENT_OTHER): Payer: Self-pay

## 2024-05-28 ENCOUNTER — Other Ambulatory Visit (HOSPITAL_BASED_OUTPATIENT_CLINIC_OR_DEPARTMENT_OTHER): Payer: Self-pay

## 2024-05-28 MED ORDER — LEVOFLOXACIN 500 MG PO TABS
500.0000 mg | ORAL_TABLET | Freq: Every day | ORAL | 0 refills | Status: AC
  Filled 2024-05-28: qty 5, 5d supply, fill #0

## 2024-05-30 ENCOUNTER — Telehealth: Payer: Self-pay | Admitting: *Deleted

## 2024-05-30 NOTE — Telephone Encounter (Signed)
 Spoke to patient and let him know that per provider, [facility], his blood pressure readings were normal and well controlled. Patient was pleased and had no questions at this time.

## 2024-06-14 ENCOUNTER — Ambulatory Visit: Attending: Emergency Medicine | Admitting: Emergency Medicine

## 2024-06-14 ENCOUNTER — Ambulatory Visit

## 2024-06-14 ENCOUNTER — Encounter: Payer: Self-pay | Admitting: Emergency Medicine

## 2024-06-14 VITALS — BP 108/54 | HR 61 | Ht 75.0 in | Wt 165.0 lb

## 2024-06-14 DIAGNOSIS — R55 Syncope and collapse: Secondary | ICD-10-CM

## 2024-06-14 DIAGNOSIS — R42 Dizziness and giddiness: Secondary | ICD-10-CM | POA: Diagnosis not present

## 2024-06-14 DIAGNOSIS — E785 Hyperlipidemia, unspecified: Secondary | ICD-10-CM | POA: Diagnosis not present

## 2024-06-14 DIAGNOSIS — I340 Nonrheumatic mitral (valve) insufficiency: Secondary | ICD-10-CM

## 2024-06-14 DIAGNOSIS — I25118 Atherosclerotic heart disease of native coronary artery with other forms of angina pectoris: Secondary | ICD-10-CM | POA: Diagnosis not present

## 2024-06-14 DIAGNOSIS — I272 Pulmonary hypertension, unspecified: Secondary | ICD-10-CM | POA: Diagnosis not present

## 2024-06-14 DIAGNOSIS — I071 Rheumatic tricuspid insufficiency: Secondary | ICD-10-CM

## 2024-06-14 DIAGNOSIS — G4733 Obstructive sleep apnea (adult) (pediatric): Secondary | ICD-10-CM

## 2024-06-14 DIAGNOSIS — I4821 Permanent atrial fibrillation: Secondary | ICD-10-CM | POA: Diagnosis not present

## 2024-06-14 NOTE — Progress Notes (Unsigned)
Enrolled for [employer] to mail a ZIO XT long term holter monitor to the patients address on file.   Dr. Turner to read. 

## 2024-06-14 NOTE — Patient Instructions (Addendum)
 Medication Instructions:  NO CHANGES  Lab Work: NONE TO BE DONE TODAY.  Testing/Procedures:  TO BE SCHEDULED FOR MARCH 2026. (ORDER ALREADY IN)  Your physician has requested that you have an echocardiogram. Echocardiography is a painless test that uses sound waves to create images of your heart. It provides your doctor with information about the size and shape of your heart and how well your hearts chambers and valves are working. This procedure takes approximately one hour. There are no restrictions for this procedure. Please do NOT wear cologne, perfume, aftershave, or lotions (deodorant is allowed). Please arrive 15 minutes prior to your appointment time.  Please note: We ask at that you not bring children with you during ultrasound (echo/ vascular) testing. Due to room size and safety concerns, children are not allowed in the ultrasound rooms during exams. Our front office staff cannot provide observation of children in our lobby area while testing is being conducted. An adult accompanying a patient to their appointment will only be allowed in the ultrasound room at the discretion of the ultrasound technician under special circumstances. We apologize for any inconvenience.  ZIO XT- Long Term Monitor Instructions  Your physician has requested you wear a ZIO patch monitor for 7 days.  This is a single patch monitor. [employer] supplies one patch monitor per enrollment. Additional stickers are not available. Please do not apply patch if you will be having a Nuclear Stress Test,  Echocardiogram, Cardiac CT, MRI, or Chest Xray during the period you would be wearing the  monitor. The patch cannot be worn during these tests. You cannot remove and re-apply the  ZIO XT patch monitor.  Your ZIO patch monitor will be mailed 3 day USPS to your address on file. It may take 3-5 days  to receive your monitor after you have been enrolled.  Once you have received your monitor, please review the enclosed  instructions. Your monitor  has already been registered assigning a specific monitor serial # to you.  Billing and Patient Assistance Program Information  We have supplied [employer] with any of your insurance information on file for billing purposes. [employer] offers a sliding scale Patient Assistance Program for patients that do not have  insurance, or whose insurance does not completely cover the cost of the ZIO monitor.  You must apply for the Patient Assistance Program to qualify for this discounted rate.  To apply, please call [employer] at [phone], select option 4, select option 2, ask to apply for  Patient Assistance Program. Meredeth will ask your household income, and how many people  are in your household. They will quote your out-of-pocket cost based on that information.  [employer] will also be able to set up a 33-month, interest-free payment plan if needed.  Applying the monitor   Shave hair from upper left chest.  Hold abrader disc by orange tab. Rub abrader in 40 strokes over the upper left chest as  indicated in your monitor instructions.  Clean area with 4 enclosed alcohol pads. Let dry.  Apply patch as indicated in monitor instructions. Patch will be placed under collarbone on left  side of chest with arrow pointing upward.  Rub patch adhesive wings for 2 minutes. Remove white label marked 1. Remove the white  label marked 2. Rub patch adhesive wings for 2 additional minutes.  While looking in a mirror, press and release button in center of patch. A small green light will  flash 3-4 times. This will be your only indicator that  the monitor has been turned on.  Do not shower for the first 24 hours. You may shower after the first 24 hours.  Press the button if you feel a symptom. You will hear a small click. Record Date, Time and  Symptom in the Patient Logbook.  When you are ready to remove the patch, follow instructions on the last 2 pages of Patient  Logbook. Stick patch  monitor onto the last page of Patient Logbook.  Place Patient Logbook in the blue and white box. Use locking tab on box and tape box closed  securely. The blue and white box has prepaid postage on it. Please place it in the mailbox as  soon as possible. Your physician should have your test results approximately 7 days after the  monitor has been mailed back to [facility].  Call [facility] Customer Care at [phone] if you have questions regarding  your ZIO XT patch monitor. Call them immediately if you see an orange light blinking on your  monitor.  If your monitor falls off in less than 4 days, contact our Monitor department at [phone].  If your monitor becomes loose or falls off after 4 days call [employer] at [phone] for  suggestions on securing your monitor    Follow-Up: At [facility], you and your health needs are our priority.  As part of our continuing mission to provide you with exceptional heart care, our providers are all part of one team.  This team includes your primary Cardiologist (physician) and Advanced Practice Providers or APPs (Physician Assistants and Nurse Practitioners) who all work together to provide you with the care you need, when you need it.  Your next appointment:   3 MONTHS WITH DR. SHLOMO (1 WEEK POST ECHOCARDIOGRAM)   Provider:   Wilbert Shlomo, MD    We recommend signing up for the patient portal called MyChart.  Sign up information is provided on this After Visit Summary.  MyChart is used to connect with patients for Virtual Visits (Telemedicine).  Patients are able to view lab/test results, encounter notes, upcoming appointments, etc.  Non-urgent messages can be sent to your provider as well.   To learn more about what you can do with MyChart, go to [email or web address].   Other Instructions:

## 2024-06-14 NOTE — Progress Notes (Signed)
 " Cardiology Office Note:    Date:  06/14/2024  ID:  Zyler Hyson, DOB [birth year], MRN [id number] PCP: Watt Harlene BROCKS, MD  [facility] Providers Cardiologist:  Wilbert Bihari, MD Cardiology APP:  Rana Lum CROME, NP       Patient Profile:       Chief Complaint: 6-week follow-up for near syncope History of Present Illness:  Troy Willis is a 79 y.o. male with visit-pertinent history of hyperlipidemia, permanent atrial fibrillation on Xarelto , coronary artery disease s/p MI s/p DES to RCA 2017, broncchiectasis/IPF, moderate pulmonary hypertension, mitral regurgitation, tricuspid regurgitation, obstructive sleep apnea   History of MI with DES to RCA on 09/07/15 and 50% stenosis in mid LAD with normal FFR.    Was seen 03/2021 with stable chronic exertional dyspnea due to  bronchiectasis. Updated echo was ordered for monitoring of mitral regurgitation.    He had echocardiogram 04/16/21 with LVEF 60-65%, no RWMA, mild LVH, indeterminate diastolic parameters, RVSF mildly reduced, RV mildly enlarged, moderately elevated PASP, RVSP 45.2 mmHg, LA severely dilated, RA moderately dilated, bileaflet mitral valve prolapse, moderate to severe MR, moderate TR, aortic root and ascending aorta 37mm upper limit of normal, could not exlude PFO.    He was seen 05/04/21 and set up for transesophageal echocardiogram for monitoring of MR. His Lasix  was increased to 20mg  daily due to orthopnea, occasional PND, LE edema. TEE 05/26/21 showed normal LVEF, moderate MR with myxomatous mitral valve with bileaflet prolapse, moderate TR, bilateral atria severely dilated, mild (gr2) plaque involving aortic arch, negative bubble study.    Repeat echo 11/2021 EF 55-60%, no RWMA, normal strain, LA moderate dilated, RA severely dilated, moderate MR (thought to be atrial FMR not primarily structural MR), trivial AI   Echocardiogram 08/02/2022 with LVEF 60 to 65%, no RWMA, mild LVH, RV function and size  normal, mildly elevated PASP, estimated right ventricular systolic pressure of 37.2 mmHg, biatrial size severely dilated, myxomatous mitral valve with mild to moderate mitral valve regurgitation, moderate tricuspid valve regurgitation   Echocardiogram 08/03/2023 showed LVEF 60 to 65%, no RWMA, mild LVH, RV function size normal, mildly elevated PASP, biatrial size severely dilated, mild mitral valve regurgitation, moderate to severe tricuspid valve regurgitation   Seen in clinic on 08/10/2023 by Dr. Bihari.  Reported that he has had shortness of breath that is unchanged.  He has chronic lower extremity edema control with diuretics.  Remain compliant with CPAP.  He was without any anginal symptoms.  He remained in permanent atrial fibrillation with good heart rate control.  Last seen in clinic on 05/01/2024.  Reported that he began a new exercise program and developed an episode of dizziness and then presyncope which lasted less than 30 seconds and resolved.  His blood pressures in office were 90/48 and repeat 98/50.  He was not on any antihypertensives.  He was to increase his fluid intake.   Discussed the use of AI scribe software for clinical note transcription with the patient, who gave verbal consent to proceed.  History of Present Illness Troy Willis is a 79 year old male with pulmonary fibrosis and mitral and tricuspid valve regurgitation who presents with episodes of lightheadedness and dizziness.  Over the past six weeks he has had increasing episodes of lightheadedness and a sensation of near syncope, now occurring two to three times per week. Episodes are typically triggered by transitioning from sitting to standing, such as getting out of a car and walking across the  street.  He exercises regularly on the treadmill and with light weights without dizziness during usual activity. He may feel lightheaded when he pushes himself on the treadmill, especially with higher incline and  slowing down, which he attributes to limited breathing from pulmonary fibrosis.  He takes daily furosemide  for ankle swelling that occurs one to three times per week and drinks large amounts of water . He does not use electrolyte supplements and urinates frequently, raising concern for possible dehydration contributing to symptoms.  He has known moderate to severe tricuspid regurgitation. He denies chest pain, palpitations, or tachycardia during lightheaded episodes. At night he can feel his heartbeat when lying in bed, which is longstanding for him.   Review of systems:  Please see the history of present illness. All other systems are reviewed and otherwise negative.      Studies Reviewed:    EKG Interpretation Date/Time:  Friday June 14 2024 08:25:43 EST Ventricular Rate:  61 PR Interval:    QRS Duration:  96 QT Interval:  432 QTC Calculation: 434 R Axis:   74  Text Interpretation: Atrial fibrillation Cannot rule out Anterior infarct (cited on or before 01-May-2024) When compared with ECG of 01-May-2024 15:36, No significant change was found Confirmed by Rana Dixon [phone]) on 06/14/2024 9:00:15 AM    Echocardiogram 08/03/2023 1. Left ventricular ejection fraction, by estimation, is 60 to 65%. The  left ventricle has normal function. The left ventricle has no regional  wall motion abnormalities. There is mild left ventricular hypertrophy.  Left ventricular diastolic parameters  are indeterminate.   2. Right ventricular systolic function is normal. The right ventricular  size is normal. There is mildly elevated pulmonary artery systolic  pressure.   3. Left atrial size was severely dilated.   4. Right atrial size was severely dilated.   5. The mitral valve is normal in structure. Mild mitral valve  regurgitation. No evidence of mitral stenosis.   6. Tricuspid valve regurgitation is moderate to severe.   7. The aortic valve is tricuspid. Aortic valve regurgitation is not   visualized. No aortic stenosis is present.   8. The inferior vena cava is dilated in size with >50% respiratory  variability, suggesting right atrial pressure of 8 mmHg.    Echocardiogram 08/02/2022 1. Left ventricular ejection fraction, by estimation, is 60 to 65%. The  left ventricle has normal function. The left ventricle has no regional  wall motion abnormalities. There is mild left ventricular hypertrophy.  Left ventricular diastolic parameters  are indeterminate.   2. Right ventricular systolic function is normal. The right ventricular  size is normal. There is mildly elevated pulmonary artery systolic  pressure. The estimated right ventricular systolic pressure is 37.2 mmHg.   3. Left atrial size was severely dilated.   4. Right atrial size was severely dilated.   5. Bileaflet prolapse. The mitral valve is myxomatous. Mild to moderate  mitral valve regurgitation.   6. Tricuspid valve regurgitation is moderate.   7. Aortic valve regurgitation is not visualized.   8. The inferior vena cava is normal in size with <50% respiratory  variability, suggesting right atrial pressure of 8 mmHg.   9. Cannot exclude a small PFO.      Lexiscan  Myoview  09/08/2017 Nuclear stress EF: 56%. The study is normal. This is a low risk study. The left ventricular ejection fraction is normal (55-65%). There was no ST segment deviation noted during stress.   Cardiac catheterization 09/06/2025 Mid LAD lesion, 50% stenosed. FFR of  0.84 which is not felt to be significant. Prox RCA lesion, 80% stenosed. Post intervention with a 4.0 x 16 Synergy drug-eluting stent, postdilated to greater than 4.5 mm, there is a 0% residual stenosis. Normal left ventricular function. LVEDP 15 mmHg.   Continue dual antiplatelet therapy for at least 6 months.  If it is decided that he should be on anticoagulation for his atrial fibrillation, antiplatelet therapy would have to be adjusted. Continue aggressive secondary  prevention and risk factor modification. Diagnostic Dominance: Right  Intervention     Risk Assessment/Calculations:    CHA2DS2-VASc Score = 4   This indicates a 4.8% annual risk of stroke. The patient's score is based upon: CHF History: 0 HTN History: 1 Diabetes History: 0 Stroke History: 0 Vascular Disease History: 1 Age Score: 2 Gender Score: 0             Physical Exam:   VS:  BP (!) 108/54 (BP Location: Right Arm, Patient Position: Sitting, Cuff Size: Normal)   Pulse 61   Ht 6' 3 (1.905 m)   Wt 165 lb (74.8 kg)   BMI 20.62 kg/m    Wt Readings from Last 3 Encounters:  06/14/24 165 lb (74.8 kg)  05/10/24 160 lb 6.4 oz (72.8 kg)  05/01/24 167 lb (75.8 kg)    GEN: Well nourished, well developed in no acute distress NECK: No JVD; No carotid bruits CARDIAC: Irregularly irregular rhythm, no murmurs, rubs, gallops RESPIRATORY:  Clear to auscultation without rales, wheezing or rhonchi  ABDOMEN: Soft, non-tender, non-distended EXTREMITIES:  No edema; No acute deformity      Assessment and Plan:  Presyncope Dizziness Over the past 6 weeks he has had increasing episodes of lightheadedness and a sensation of near syncope now occurring 2-3 times per week.  Episodes are triggered by transitioning from sitting to standing such as getting out of a car from a long drive and walking across the street.  He also notes similar episodes when walking up an incline on the treadmill.  He does exercise daily and does not experience symptoms on exertion.  He denies any overt syncope and is without any episodes of palpitations or fast heart rates while symptomatic - EKG today shows rate controlled atrial fibrillation with heart rate of 61 bpm - Home blood pressure logs today reveal no hypotension - He is not managed on antihypertensive medications but does take furosemide  daily for his moderate to severe TVR - He was not orthostatic in office today per orthostatic VS - He does have low  diastolic pressure which can be contributive.  May need to move his furosemide  to as needed in the future - I have encouraged him to remain adequately hydrated, wear compression socks, go from standing to sitting slowly, and use electrolyte packets daily - Plan for 7-day ZIO to monitor for arrhythmia, pauses, or AV blocks - Plan for echocardiogram on 07/2024   Coronary artery disease S/p MI s/p DES to RCA on 08/2015 and 50% stenosis in the mid LAD however FFR was normal at that site - He denies exertional chest discomfort.  No change to chronic DOE (history of bronchiectasis/IPF).  Denies prior anginal equivalent.  No symptoms to suggest active angina.  No indication for further ischemic evaluation at this time - No ASA due to DOAC - Continue Nexlizet  180-10 mg daily   Permanent atrial fibrillation Today he is rate controlled at 61 bpm - He is currently unaware of his arrhythmia and was without any palpitations or  fast heart rate - Continue Xarelto  20 mg daily, denies bleeding concerns   Hyperlipidemia, LDL goal <70 LDL 67 on 01/2024 and well-controlled - Continue Nexlizet  180-10 mg daily   Moderate pulmonary HTN Likely related to underlying lung disease with bronchiectasis and IPF  Echo 07/2022 with PASP 37.2 mmHg Echo 07/2023 with PASP of 43.3 mmHg - Today he appears euvolemic and well compensated without LEE - Continue furosemide  20 mg daily - Plan for echocardiogram on 07/2024 for routine monitoring   Mitral valve regurgitation Echo 07/2023 with mild mitral valve regurgitation - No indication further intervention at this time.  Pending repeat echocardiogram on 07/2024 for routine evaluation   Tricuspid valve regurgitation Echo 07/2023 with moderate to severe tricuspid valve regurgitation with normal RV size and function - No indication for further intervention at this time.  Pending repeat echocardiogram on 07/2024 for routine evaluation - He appears well compensated today.  Continue  furosemide  20 mg as needed   Obstructive sleep apnea - Remains adherent to CPAP therapy  Bronchiectasis related to pulmonary fibrosis - Management per pulmonology      Dispo:  Return in about 3 months (around 09/12/2024).  Signed, Lum LITTIE Louis, NP  "

## 2024-07-18 ENCOUNTER — Ambulatory Visit (HOSPITAL_COMMUNITY)

## 2024-08-12 ENCOUNTER — Ambulatory Visit: Admitting: Emergency Medicine

## 2024-09-24 ENCOUNTER — Inpatient Hospital Stay

## 2024-09-24 ENCOUNTER — Inpatient Hospital Stay: Admitting: Hematology & Oncology
